# Patient Record
Sex: Male | Born: 1960 | Race: White | Hispanic: No | Marital: Married | Smoking: Never smoker
Health system: Southern US, Community
[De-identification: ages and names within clinical notes are randomized; demographics above are authoritative.]

## PROBLEM LIST (undated history)

## (undated) DIAGNOSIS — C801 Malignant (primary) neoplasm, unspecified: Secondary | ICD-10-CM

## (undated) DIAGNOSIS — M1990 Unspecified osteoarthritis, unspecified site: Secondary | ICD-10-CM

## (undated) DIAGNOSIS — Z9989 Dependence on other enabling machines and devices: Secondary | ICD-10-CM

## (undated) DIAGNOSIS — G4733 Obstructive sleep apnea (adult) (pediatric): Secondary | ICD-10-CM

## (undated) DIAGNOSIS — G2581 Restless legs syndrome: Secondary | ICD-10-CM

## (undated) HISTORY — DX: Restless legs syndrome: G25.81

## (undated) HISTORY — DX: Unspecified osteoarthritis, unspecified site: M19.90

## (undated) HISTORY — DX: Dependence on other enabling machines and devices: Z99.89

## (undated) HISTORY — DX: Obstructive sleep apnea (adult) (pediatric): G47.33

---

## 1984-05-21 HISTORY — PX: FACIAL FRACTURE SURGERY: SHX1570

## 1999-02-14 ENCOUNTER — Encounter: Admission: RE | Admit: 1999-02-14 | Discharge: 1999-03-22 | Payer: Self-pay | Admitting: Family Medicine

## 2010-07-03 ENCOUNTER — Other Ambulatory Visit: Payer: Self-pay | Admitting: Family Medicine

## 2010-07-03 DIAGNOSIS — R2230 Localized swelling, mass and lump, unspecified upper limb: Secondary | ICD-10-CM

## 2010-07-07 ENCOUNTER — Ambulatory Visit
Admission: RE | Admit: 2010-07-07 | Discharge: 2010-07-07 | Disposition: A | Discharge status: Home / Self Care | Payer: BC Managed Care – PPO | Source: Ambulatory Visit | Attending: Family Medicine | Admitting: Family Medicine

## 2010-07-07 DIAGNOSIS — R2230 Localized swelling, mass and lump, unspecified upper limb: Secondary | ICD-10-CM

## 2016-10-30 DIAGNOSIS — H5203 Hypermetropia, bilateral: Secondary | ICD-10-CM | POA: Diagnosis not present

## 2016-10-30 DIAGNOSIS — H524 Presbyopia: Secondary | ICD-10-CM | POA: Diagnosis not present

## 2017-01-28 ENCOUNTER — Ambulatory Visit
Admission: RE | Admit: 2017-01-28 | Discharge: 2017-01-28 | Disposition: A | Discharge status: Home / Self Care | Payer: Commercial Managed Care - PPO | Source: Ambulatory Visit | Attending: Family Medicine | Admitting: Family Medicine

## 2017-01-28 ENCOUNTER — Other Ambulatory Visit: Payer: Self-pay | Admitting: Family Medicine

## 2017-01-28 ENCOUNTER — Other Ambulatory Visit: Payer: Self-pay

## 2017-01-28 DIAGNOSIS — M7989 Other specified soft tissue disorders: Secondary | ICD-10-CM | POA: Diagnosis not present

## 2017-01-28 DIAGNOSIS — R52 Pain, unspecified: Secondary | ICD-10-CM

## 2017-01-28 DIAGNOSIS — M19042 Primary osteoarthritis, left hand: Secondary | ICD-10-CM | POA: Diagnosis not present

## 2017-01-28 DIAGNOSIS — M79645 Pain in left finger(s): Secondary | ICD-10-CM | POA: Diagnosis not present

## 2017-01-28 DIAGNOSIS — M25572 Pain in left ankle and joints of left foot: Secondary | ICD-10-CM | POA: Diagnosis not present

## 2017-05-07 DIAGNOSIS — E559 Vitamin D deficiency, unspecified: Secondary | ICD-10-CM | POA: Diagnosis not present

## 2017-05-07 DIAGNOSIS — Z23 Encounter for immunization: Secondary | ICD-10-CM | POA: Diagnosis not present

## 2017-05-07 DIAGNOSIS — Z0000 Encounter for general adult medical examination without abnormal findings: Secondary | ICD-10-CM | POA: Diagnosis not present

## 2017-05-07 DIAGNOSIS — Z125 Encounter for screening for malignant neoplasm of prostate: Secondary | ICD-10-CM | POA: Diagnosis not present

## 2017-05-07 DIAGNOSIS — G2581 Restless legs syndrome: Secondary | ICD-10-CM | POA: Diagnosis not present

## 2017-05-10 DIAGNOSIS — M79645 Pain in left finger(s): Secondary | ICD-10-CM | POA: Diagnosis not present

## 2017-05-10 DIAGNOSIS — M1812 Unilateral primary osteoarthritis of first carpometacarpal joint, left hand: Secondary | ICD-10-CM | POA: Diagnosis not present

## 2017-05-13 DIAGNOSIS — M79645 Pain in left finger(s): Secondary | ICD-10-CM | POA: Diagnosis not present

## 2017-05-13 DIAGNOSIS — M1812 Unilateral primary osteoarthritis of first carpometacarpal joint, left hand: Secondary | ICD-10-CM | POA: Diagnosis not present

## 2017-06-11 DIAGNOSIS — G4733 Obstructive sleep apnea (adult) (pediatric): Secondary | ICD-10-CM | POA: Diagnosis not present

## 2017-07-02 DIAGNOSIS — G4733 Obstructive sleep apnea (adult) (pediatric): Secondary | ICD-10-CM | POA: Diagnosis not present

## 2017-07-30 DIAGNOSIS — G4733 Obstructive sleep apnea (adult) (pediatric): Secondary | ICD-10-CM | POA: Diagnosis not present

## 2017-08-23 DIAGNOSIS — H9311 Tinnitus, right ear: Secondary | ICD-10-CM | POA: Diagnosis not present

## 2017-08-29 DIAGNOSIS — G4733 Obstructive sleep apnea (adult) (pediatric): Secondary | ICD-10-CM | POA: Diagnosis not present

## 2017-08-30 DIAGNOSIS — G4733 Obstructive sleep apnea (adult) (pediatric): Secondary | ICD-10-CM | POA: Diagnosis not present

## 2017-09-12 DIAGNOSIS — G4733 Obstructive sleep apnea (adult) (pediatric): Secondary | ICD-10-CM | POA: Diagnosis not present

## 2017-09-23 DIAGNOSIS — R030 Elevated blood-pressure reading, without diagnosis of hypertension: Secondary | ICD-10-CM | POA: Diagnosis not present

## 2017-09-29 DIAGNOSIS — G4733 Obstructive sleep apnea (adult) (pediatric): Secondary | ICD-10-CM | POA: Diagnosis not present

## 2017-10-01 DIAGNOSIS — H524 Presbyopia: Secondary | ICD-10-CM | POA: Diagnosis not present

## 2017-10-01 DIAGNOSIS — H5203 Hypermetropia, bilateral: Secondary | ICD-10-CM | POA: Diagnosis not present

## 2017-11-01 DIAGNOSIS — G4733 Obstructive sleep apnea (adult) (pediatric): Secondary | ICD-10-CM | POA: Diagnosis not present

## 2018-01-30 DIAGNOSIS — G4733 Obstructive sleep apnea (adult) (pediatric): Secondary | ICD-10-CM | POA: Diagnosis not present

## 2018-02-12 DIAGNOSIS — M542 Cervicalgia: Secondary | ICD-10-CM | POA: Diagnosis not present

## 2018-02-12 DIAGNOSIS — R030 Elevated blood-pressure reading, without diagnosis of hypertension: Secondary | ICD-10-CM | POA: Diagnosis not present

## 2018-02-27 ENCOUNTER — Other Ambulatory Visit: Payer: Self-pay | Admitting: Family Medicine

## 2018-02-27 DIAGNOSIS — R221 Localized swelling, mass and lump, neck: Secondary | ICD-10-CM

## 2018-03-01 DIAGNOSIS — G4733 Obstructive sleep apnea (adult) (pediatric): Secondary | ICD-10-CM | POA: Diagnosis not present

## 2018-03-05 ENCOUNTER — Ambulatory Visit
Admission: RE | Admit: 2018-03-05 | Discharge: 2018-03-05 | Disposition: A | Discharge status: Home / Self Care | Payer: Commercial Managed Care - PPO | Source: Ambulatory Visit | Attending: Family Medicine | Admitting: Family Medicine

## 2018-03-05 DIAGNOSIS — R221 Localized swelling, mass and lump, neck: Secondary | ICD-10-CM

## 2018-03-05 MED ORDER — IOPAMIDOL (ISOVUE-370) INJECTION 76%
75.0000 mL | Freq: Once | INTRAVENOUS | Status: AC | PRN
  Administered 2018-03-05: 75 mL via INTRAVENOUS

## 2018-03-07 ENCOUNTER — Other Ambulatory Visit: Payer: Commercial Managed Care - PPO

## 2018-03-13 DIAGNOSIS — R590 Localized enlarged lymph nodes: Secondary | ICD-10-CM | POA: Diagnosis not present

## 2018-03-13 DIAGNOSIS — R591 Generalized enlarged lymph nodes: Secondary | ICD-10-CM | POA: Insufficient documentation

## 2018-03-20 DIAGNOSIS — R591 Generalized enlarged lymph nodes: Secondary | ICD-10-CM | POA: Diagnosis not present

## 2018-03-20 DIAGNOSIS — R221 Localized swelling, mass and lump, neck: Secondary | ICD-10-CM | POA: Diagnosis not present

## 2018-04-01 ENCOUNTER — Other Ambulatory Visit (HOSPITAL_COMMUNITY): Payer: Self-pay | Admitting: Otolaryngology

## 2018-04-01 DIAGNOSIS — G4733 Obstructive sleep apnea (adult) (pediatric): Secondary | ICD-10-CM | POA: Diagnosis not present

## 2018-04-01 DIAGNOSIS — R591 Generalized enlarged lymph nodes: Secondary | ICD-10-CM

## 2018-04-04 ENCOUNTER — Other Ambulatory Visit: Payer: Self-pay | Admitting: Radiology

## 2018-04-07 ENCOUNTER — Encounter (HOSPITAL_COMMUNITY): Payer: Self-pay

## 2018-04-07 ENCOUNTER — Other Ambulatory Visit: Payer: Self-pay

## 2018-04-07 ENCOUNTER — Ambulatory Visit (HOSPITAL_COMMUNITY)
Admission: RE | Admit: 2018-04-07 | Discharge: 2018-04-07 | Disposition: A | Discharge status: Home / Self Care | Payer: Commercial Managed Care - PPO | Source: Ambulatory Visit | Attending: Otolaryngology | Admitting: Otolaryngology

## 2018-04-07 DIAGNOSIS — R590 Localized enlarged lymph nodes: Secondary | ICD-10-CM | POA: Insufficient documentation

## 2018-04-07 DIAGNOSIS — Z79899 Other long term (current) drug therapy: Secondary | ICD-10-CM | POA: Insufficient documentation

## 2018-04-07 DIAGNOSIS — R591 Generalized enlarged lymph nodes: Secondary | ICD-10-CM

## 2018-04-07 DIAGNOSIS — C770 Secondary and unspecified malignant neoplasm of lymph nodes of head, face and neck: Secondary | ICD-10-CM | POA: Diagnosis not present

## 2018-04-07 DIAGNOSIS — C801 Malignant (primary) neoplasm, unspecified: Secondary | ICD-10-CM | POA: Diagnosis not present

## 2018-04-07 LAB — PROTIME-INR
INR: 1
PROTHROMBIN TIME: 13.1 s (ref 11.4–15.2)

## 2018-04-07 LAB — CBC
HEMATOCRIT: 47 % (ref 39.0–52.0)
Hemoglobin: 15.4 g/dL (ref 13.0–17.0)
MCH: 30.7 pg (ref 26.0–34.0)
MCHC: 32.8 g/dL (ref 30.0–36.0)
MCV: 93.8 fL (ref 80.0–100.0)
NRBC: 0 % (ref 0.0–0.2)
Platelets: 306 10*3/uL (ref 150–400)
RBC: 5.01 MIL/uL (ref 4.22–5.81)
RDW: 12.1 % (ref 11.5–15.5)
WBC: 8.9 10*3/uL (ref 4.0–10.5)

## 2018-04-07 MED ORDER — MIDAZOLAM HCL 2 MG/2ML IJ SOLN
INTRAMUSCULAR | Status: AC | PRN
  Administered 2018-04-07: 1 mg via INTRAVENOUS

## 2018-04-07 MED ORDER — FENTANYL CITRATE (PF) 100 MCG/2ML IJ SOLN
INTRAMUSCULAR | Status: AC | PRN
  Administered 2018-04-07: 50 ug via INTRAVENOUS

## 2018-04-07 MED ORDER — LIDOCAINE HCL (PF) 1 % IJ SOLN
INTRAMUSCULAR | Status: AC
  Filled 2018-04-07: qty 30

## 2018-04-07 MED ORDER — FENTANYL CITRATE (PF) 100 MCG/2ML IJ SOLN
INTRAMUSCULAR | Status: AC
  Filled 2018-04-07: qty 2

## 2018-04-07 MED ORDER — SODIUM CHLORIDE 0.9 % IV SOLN: INTRAVENOUS | Status: DC

## 2018-04-07 MED ORDER — MIDAZOLAM HCL 2 MG/2ML IJ SOLN
INTRAMUSCULAR | Status: AC
  Filled 2018-04-07: qty 2

## 2018-04-07 NOTE — Procedures (Signed)
Left cervical adenopathy  S/p [place] CORE BX No comp Stable Full report in pacs Path pending

## 2018-04-07 NOTE — H&P (Addendum)
Chief Complaint: Patient was seen in consultation today for left cervical lymph node biopsy at the request of [place]  Referring Physician(s): Goodman,Glenn  Supervising Physician: Glenn Goodman  Patient Status: [facility] - Out-pt  History of Present Illness: Glenn Goodman is a 57 y.o. 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 A mucosal lesion is not definitely identified  Now scheduled for biopsy of left cervical LN Never smoker or smokeless tobacco  History reviewed. No pertinent past medical history.  History reviewed. No pertinent surgical history.  Allergies: Patient has no known allergies.  Medications: Prior to Admission medications   Medication Sig Start Date End Date Taking? Authorizing Provider  Cholecalciferol (VITAMIN D3) 50 MCG (2000 UT) TABS Take 2,000 Units by mouth daily.   Yes [provider]  famotidine (PEPCID) 20 MG tablet Take 20 mg by mouth daily as needed for heartburn or indigestion.   Yes [provider]  GLUCOSAMINE-CHONDROITIN PO Take 1 tablet by mouth daily.   Yes [provider]  Magnesium 500 MG TABS Take 500 mg by mouth daily.   Yes [provider]  pramipexole (MIRAPEX) 0.75 MG tablet Take 0.75 mg by mouth at bedtime.   Yes [provider]     History reviewed. No pertinent family history.  Social History   Socioeconomic History  . Marital status: Single    Spouse name: Not on file  . Number of children: Not on file  . Years of education: Not on file  . Highest education level: Not on file  Occupational History  . Not on file  Social Needs  . Financial  resource strain: Not on file  . Food insecurity:    Worry: Not on file    Inability: Not on file  . Transportation needs:    Medical: Not on file    Non-medical: Not on file  Tobacco Use  . Smoking status: Never Smoker  Substance and Sexual Activity  . Alcohol use: Not on file  . Drug use: Not on file  . Sexual activity: Not on file  Lifestyle  . Physical activity:    Days per week: Not on file    Minutes per session: Not on file  . Stress: Not on file  Relationships  . Social connections:    Talks on phone: Not on file    Gets together: Not on file    Attends religious service: Not on file    Active member of club or organization: Not on file    Attends meetings of clubs or organizations: Not on file    Relationship status: Not on file  Other Topics Concern  . Not on file  Social History Narrative  . Not on file     Review of Systems: A 12 point ROS discussed and pertinent positives are indicated in the HPI above.  All other systems are negative.  Review of Systems  Constitutional: Negative for activity change, appetite change, fatigue and fever.  HENT: Negative for sore throat and trouble swallowing.   Respiratory: Negative for cough.   Cardiovascular: Negative for chest pain.  Gastrointestinal: Negative for abdominal pain.  Neurological: Negative for weakness.  Psychiatric/Behavioral: Negative  for behavioral problems and confusion.    Vital Signs: BP 126/89   Pulse 82   Temp 98.6 F (37 C)   Resp 18   Ht 5\' 8"  (1.727 m)   Wt 180 lb (81.6 kg)   SpO2 96%   BMI 27.37 kg/m   Physical Exam  Constitutional: He is oriented to person, place, and time.  Cardiovascular: Normal rate and regular rhythm.  Pulmonary/Chest: Effort normal and breath sounds normal.  Abdominal: Soft. Bowel sounds are normal.  Musculoskeletal: Normal range of motion.  Neurological: He is alert and oriented to person, place, and time.  Skin: Skin is warm and dry.  Psychiatric: He has a  normal mood and affect. His behavior is normal. Judgment and thought content normal.  Vitals reviewed.   Imaging: No results found.  Labs:  CBC: Recent Labs    04/07/18 0653  WBC 8.9  HGB 15.4  HCT 47.0  PLT 306    COAGS: Recent Labs    04/07/18 0653  INR 1.00    BMP: No results for input(s): NA, K, CL, CO2, GLUCOSE, BUN, CALCIUM, CREATININE, GFRNONAA, GFRAA in the last 8760 hours.  Invalid input(s): CMP  LIVER FUNCTION TESTS: No results for input(s): BILITOT, AST, ALT, ALKPHOS, PROT, ALBUMIN in the last 8760 hours.  TUMOR MARKERS: No results for input(s): AFPTM, CEA, CA199, CHROMGRNA in the last 8760 hours.  Assessment and Plan:  Left cervical Lymphadenopathy Scheduled for biopsy of same Risks and benefits discussed with the patient including, but not limited to bleeding, infection, damage to adjacent structures or low yield requiring additional tests.  All of the patient's questions were answered, patient is agreeable to proceed. Consent signed and in chart.   Thank you for this interesting consult.  I greatly enjoyed meeting Glenn Goodman [facility] and look forward to participating in their care.  A copy of this report was sent to the requesting provider on this date.  Electronically Signed: Lavonia Drafts, PA-C 04/07/2018, 7:26 AM   I spent a total of  30 Minutes   in face to face in clinical consultation, greater than 50% of which was counseling/coordinating care for left cervical LN bx

## 2018-04-07 NOTE — Discharge Instructions (Signed)
Needle Biopsy, Care After Refer to this sheet in the next few weeks. These instructions provide you with information about caring for yourself after your procedure. Your health care provider may also give you more specific instructions. Your treatment has been planned according to current medical practices, but problems sometimes occur. Call your health care provider if you have any problems or questions after your procedure. What can I expect after the procedure? After your procedure, it is common to have soreness, bruising, or mild pain at the biopsy site. This should go away in a few days. Follow these instructions at home:  Rest as directed by your health care provider. 24-48 hours  Take medicines only as directed by your health care provider.  There are many different ways to close and cover the biopsy site, including stitches (sutures), skin glue, and adhesive strips. Follow your health care provider's instructions about: ? Biopsy site care. ? Bandage (dressing) changes and removal. Band-Aid may be removed in 24 hours. You may then shower.  Check your biopsy site every day for signs of infection. Watch for: ? Redness, swelling, or pain. ? Fluid, blood, or pus. Contact a health care provider if:  You have a fever.  You have redness, swelling, or pain at the biopsy site that lasts longer than a few days.  You have fluid, blood, or pus coming from the biopsy site.  You feel nauseous.  You vomit. Get help right away if:  You have shortness of breath.  You have trouble breathing.  You have chest pain.  You feel dizzy or you faint.  You have bleeding that does not stop with pressure or a bandage.  You cough up blood.  You have pain in your abdomen. This information is not intended to replace advice given to you by your health care provider. Make sure you discuss any questions you have with your health care provider. Document Released: 09/21/2014 Document Revised:  10/13/2015 Document Reviewed: 05/03/2014 Elsevier Interactive Patient Education  2018 [place]. Moderate Conscious Sedation, Adult, Care After These instructions provide you with information about caring for yourself after your procedure. Your health care provider may also give you more specific instructions. Your treatment has been planned according to current medical practices, but problems sometimes occur. Call your health care provider if you have any problems or questions after your procedure. What can I expect after the procedure? After your procedure, it is common:  To feel sleepy for several hours.  To feel clumsy and have poor balance for several hours.  To have poor judgment for several hours.  To vomit if you eat too soon.  Follow these instructions at home: For at least 24 hours after the procedure:   Do not: ? Participate in activities where you could fall or become injured. ? Drive. ? Use heavy machinery. ? Drink alcohol. ? Take sleeping pills or medicines that cause drowsiness. ? Make important decisions or sign legal documents. ? Take care of children on your own.  Rest. Eating and drinking  Follow the diet recommended by your health care provider.  If you vomit: ? Drink water, juice, or soup when you can drink without vomiting. ? Make sure you have little or no nausea before eating solid foods. General instructions  Have a responsible adult stay with you until you are awake and alert.  Take over-the-counter and prescription medicines only as told by your health care provider.  If you smoke, do not smoke without supervision.  Keep all  follow-up visits as told by your health care provider. This is important. Contact a health care provider if:  You keep feeling nauseous or you keep vomiting.  You feel light-headed.  You develop a rash.  You have a fever. Get help right away if:  You have trouble breathing. This information is not intended to  replace advice given to you by your health care provider. Make sure you discuss any questions you have with your health care provider. Document Released: 02/25/2013 Document Revised: 10/10/2015 Document Reviewed: 08/27/2015 Elsevier Interactive Patient Education  [employer].

## 2018-04-10 DIAGNOSIS — C4442 Squamous cell carcinoma of skin of scalp and neck: Secondary | ICD-10-CM | POA: Diagnosis not present

## 2018-04-14 DIAGNOSIS — C4442 Squamous cell carcinoma of skin of scalp and neck: Secondary | ICD-10-CM | POA: Insufficient documentation

## 2018-04-21 ENCOUNTER — Telehealth: Payer: Self-pay | Admitting: *Deleted

## 2018-04-21 NOTE — Telephone Encounter (Signed)
Let rick know about this pt he will  Call thie pt and set something up.

## 2018-04-22 ENCOUNTER — Telehealth: Payer: Self-pay | Admitting: *Deleted

## 2018-04-22 ENCOUNTER — Other Ambulatory Visit: Payer: Self-pay | Admitting: Otolaryngology

## 2018-04-22 DIAGNOSIS — C4442 Squamous cell carcinoma of skin of scalp and neck: Secondary | ICD-10-CM | POA: Diagnosis not present

## 2018-04-22 DIAGNOSIS — C024 Malignant neoplasm of lingual tonsil: Secondary | ICD-10-CM | POA: Diagnosis not present

## 2018-04-22 DIAGNOSIS — C01 Malignant neoplasm of base of tongue: Secondary | ICD-10-CM | POA: Diagnosis not present

## 2018-04-22 HISTORY — PX: DIRECT LARYNGOSCOPY: SHX5326

## 2018-04-23 ENCOUNTER — Other Ambulatory Visit: Payer: Self-pay | Admitting: *Deleted

## 2018-04-23 DIAGNOSIS — C4442 Squamous cell carcinoma of skin of scalp and neck: Secondary | ICD-10-CM

## 2018-04-23 NOTE — Telephone Encounter (Signed)
Oncology Nurse Navigator Documentation  Rec'd call from Mr. Popp re his referral to [facility] by ENT Dr. Janace Hoard.  Introduced myself, explained my role as his navigator, indicated I would be coordinating the scheduling of PET scan followed by appts with Rad/MedOnc.    He indicated willingness to have PET at [facility].  He further noted he is having pandenoscopy with biopsy later today.  I explained the purpose of a dental evaluation prior to starting RT, indicated he wd be contacted by [place] to arrange an appt within a day or so of his appt with Dr. Isidore Moos.  I encouraged him to call me with questions/concerns as he continues with appts.  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  Work hours are flexible though he prefers appts after 1:00 when possible.  Has Short Term Disability. . Support system:  Wife, "empty nesters". . Transportation:  Drives self. Marland Kitchen PCP:  Yes, Elsworth Soho.  Sees him minimally annually.  Last saw in September, he ordered CT Neck.  Unsure if PCP aware of dx.  PCD;  Dr. Clide Cliff.  Sees him q 6 months.  Reports good dental health.  [name], RN, BSN Head & Neck Oncology Nurse [place] at [place] [phone]

## 2018-04-24 ENCOUNTER — Telehealth: Payer: Self-pay | Admitting: *Deleted

## 2018-04-24 ENCOUNTER — Other Ambulatory Visit: Payer: Self-pay | Admitting: *Deleted

## 2018-04-24 DIAGNOSIS — C01 Malignant neoplasm of base of tongue: Secondary | ICD-10-CM

## 2018-04-24 NOTE — Telephone Encounter (Signed)
CALLED PATIENT TO INFORM OF PET SCAN FOR 04/29/18 - ARRIVAL TIME- 9:30 AM @ [facility] (RADIOLOGY), PT. TO BE NPO- 6 HRS. PRIOR TO TEST, LVM FOR A RETURN CALL

## 2018-04-25 ENCOUNTER — Telehealth: Payer: Self-pay | Admitting: *Deleted

## 2018-04-25 NOTE — Telephone Encounter (Signed)
Oncology Nurse Navigator Documentation  Called Glenn Goodman, provided 1/16 appts, 7:30 NE / 8:00 consult with Dr. Isidore Moos. Explained [name] registration/arrival procedures. He noted per Dr. Janace Hoard referral, he has 12/18 appt with Dr. Nicolette Bang [facility] to discuss TORS tmt option. I encouraged him to call me with questions/concerns.  [name], RN, BSN Head & Neck Oncology Nurse [place] at [place] [phone]

## 2018-04-28 ENCOUNTER — Telehealth: Payer: Self-pay | Admitting: Hematology

## 2018-04-28 NOTE — Telephone Encounter (Signed)
Pt returned my call and confirmed appt w/Dr. Maylon Peppers on 12/18 at 1pm.

## 2018-04-28 NOTE — Telephone Encounter (Signed)
Cld and left the pt appt date and time to see Dr Maylon Peppers on 12/18 at 1pm.

## 2018-04-29 ENCOUNTER — Ambulatory Visit
Admission: RE | Admit: 2018-04-29 | Discharge: 2018-04-29 | Disposition: A | Discharge status: Home / Self Care | Payer: Commercial Managed Care - PPO | Source: Ambulatory Visit | Attending: Radiation Oncology | Admitting: Radiation Oncology

## 2018-04-29 DIAGNOSIS — R590 Localized enlarged lymph nodes: Secondary | ICD-10-CM | POA: Insufficient documentation

## 2018-04-29 DIAGNOSIS — C099 Malignant neoplasm of tonsil, unspecified: Secondary | ICD-10-CM | POA: Insufficient documentation

## 2018-04-29 DIAGNOSIS — C4442 Squamous cell carcinoma of skin of scalp and neck: Secondary | ICD-10-CM

## 2018-04-29 LAB — GLUCOSE, CAPILLARY: Glucose-Capillary: 123 mg/dL — ABNORMAL HIGH (ref 70–99)

## 2018-04-29 MED ORDER — FLUDEOXYGLUCOSE F - 18 (FDG) INJECTION
9.5000 | Freq: Once | INTRAVENOUS | Status: AC | PRN
  Administered 2018-04-29: 10.14 via INTRAVENOUS

## 2018-04-29 NOTE — Progress Notes (Signed)
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 p16 immunohistochemistry is POSITIVE  04/22/18 Diagnosis Tonsil, biopsy, left, base of tongue - INVASIVE KERATINIZING SQUAMOUS CELL CARCINOMA.  Patient presented with symptoms of: He noticed in September that the left side of his neck was larger. He then presented to his PCP who ordered at CT neck.   Biopsies of left cervical lymph node revealed: metastatic squamous cell carcinoma involving a lymph node.  Biopsies of left base of tongue revealed invasive keratinizing squamous cell carcinoma.   Nutrition Status Yes No Comments  Weight changes? []  [x]    Swallowing concerns? [x]  []  He reports soreness after his biopsy.   PEG? []  [x]     Referrals Yes No Comments  Social Work? []  [x]    Dentistry? [x]  []  Dr. Enrique Sack 04/30/18  Swallowing therapy? []  [x]    Nutrition? []  [x]    Med/Onc? []  []  Dr. Maylon Peppers 05/07/18   Safety Issues Yes No Comments  Prior radiation? []  [x]    Pacemaker/ICD? []  [x]    Possible current pregnancy? []  [x]    Is the patient on methotrexate? []  [x]     Tobacco/Marijuana/Snuff/ETOH use: He has never smoked.   Past/Anticipated interventions by otolaryngology, if any:  03/13/18 Initial consult Dr. Janace Hoard  04/10/18 Dr. Janace Hoard Impression & Plans:  Glenn Goodman is a 57 y.o. male with no evidence of primary so we discussed the need to search for that. We discussed a direct laryngoscopy with guided biopsies and examination. He was informed the risk and benefits of the procedure and options were discussed all questions were answered and consent was obtained. We will also get him set up with radiation oncology to get that started. They will decide about a PET scan. We discussed various issues regarding the cancer and treatment for roughly 25  minutes.  ---- There is an appointment to see Dr. Nicolette Bang scheduled for 05/07/18  Past/Anticipated interventions by medical oncology, if any:  05/07/18 Dr. Maylon Peppers   Current Complaints / other details:    04/29/18 PET scan  BP 139/83 (BP Location: Right Arm, Patient Position: Sitting)   Pulse 75   Temp 97.7 F (36.5 C) (Oral)   Resp 16   Ht 5\' 8"  (1.727 m)   Wt 187 lb 6 oz (85 kg)   SpO2 97%   BMI 28.49 kg/m    Wt Readings from Last 3 Encounters:  05/05/18 187 lb 6 oz (85 kg)  04/07/18 180 lb (81.6 kg)

## 2018-04-29 NOTE — Progress Notes (Signed)
[place] CONSULT NOTE  Patient Care Team: [name], MD as PCP - General (Family Medicine) Melissa Montane, MD as Consulting Physician (Otolaryngology) Eppie Gibson, MD as Attending Physician (Radiation Oncology) Tish Men, MD as Consulting Physician (Hematology) Leota Sauers, RN as Oncology Nurse Navigator  HEME/ONC OVERVIEW: 1. 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, p16+ -02/2018: CT neck showed enlarged left Level II and II LN with central necrosis (largest 2.4cm); effacement of the left IJ vein noted  -03/2018: US-guided bx of left cervical LN; path showed SCCa, p16+ -04/2018: PET showed FDG-avid left palatine tonsil lesion measuring ~1.3cm with associated FDG-avid left Level IIa LN's; no evidence of metastatic disease   ASSESSMENT & PLAN:   Stage I (cT1N1M0) left tonsil SCCa, p16+ -I reviewed the patient's external records, including ENT clinic notes and path reports -I also independently reviewed the radiologic images of CT and PET, and agree with findings as documented -In summary, patient presented to ENT for a left neck mass in end of 02/2018, and underwent biopsy of the left cervical LN, which show SCCa, p16+; in addition, panendoscopy with left tonsillar biopsy showed squamous cell carcinoma, indicating that the tonsil is most likely the primary site of disease; PET showed FDG-avid left tonsil and left cervical LN's without evidence of metastatic disease -Patient met with Dr. Nicolette Bang at [facility] this morning, who discussed with the patient the benefits and risks of TORS -I also discussed with the patient at length the rationale for various treatment approaches, including upfront surgery followed by adjuvant therapy as indicated (RT or chemoradiation), and definitive chemoradiation -Based on current guidelines, surgery followed by adjuvant RT appears to be comparable to definitive chemoradiation; however, there are not yet definitive predictors based on  imaging studies prior to surgery to determine those patients who may require adjuvant chemotherapy in addition to radiation -The patient's case was discussed at the tumor board; per radiology, the border of one of the cervical lymph nodes appearred to be somewhat indistinct, raising suspicion for possible ECE -I discussed with the patient that the best case scenario, he would likely require adjuvant RT in the absence of positive margin or ECE; however, if he has any high risk adverse features, then he will require adjuvant chemoradiation -I also counseled the patient regarding some of the risks, benefits, side-effects of cisplatin. Some of the short term side-effects included, though not limited to, including weight loss, life threatening infections, risk of allergic reactions, need for transfusions of blood products, nausea, vomiting, change in bowel habits, loss of hair, admission to hospital for various reasons, and risks of death. Long term side-effects are also discussed including risks of infertility, permanent damage to nerve function, hearing loss, chronic fatigue, kidney damage with possibility needing hemodialysis, and rare secondary malignancy including bone marrow disorders. -The patient expressed understanding, and would like to discuss this further with his wife before making a decision; however, he felt that he was leaning toward upfront surgery first -Pending the patient decision, the H&N navigator will help arrange for follow-up with ENT or with medical oncology/radiation oncology  No orders of the defined types were placed in this encounter.  A total of more than 45 minutes were spent face-to-face with the patient during this encounter and over half of that time was spent on counseling and coordination of care as outlined above.    All questions were answered. The patient knows to call the clinic with any problems, questions or concerns.  Tish Men, MD 05/07/2018  1:56 PM   CHIEF  COMPLAINTS/PURPOSE OF CONSULTATION:  "I am here for cancer in the neck"  HISTORY OF PRESENTING ILLNESS:  Glenn Goodman 57 y.o. 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  Patient presented to Dr. Janace Hoard of ENT in late 02/2018 for neck mass.  Patient reports that he first felt the left neck mass after a massage in 01/2018.  The lymph node initially resolved with supportive therapy, including heat packs and NSAIDs, but he continued to have pain in the left side of the neck, prompting his PCP to obtain CT neck.  CT neck showed enlarged left Level II and III lymph nodes, concerning for malignancy.  He was initially underwent FNA of the left cervical lymph node in office in and of 02/2018, which was nondiagnostic.  He then underwent ultrasound-guided lymph node biopsy on 04/07/2018, which showed squamous of carcinoma,  p16+.  He was then taken to the OR for panendoscopy with a biopsy, and biopsy of the left tonsillar lesion showed squamous cell carcinoma.  Patient reports that since he initially noticed the left cervical lymph nodes, the lymph nodes have been overall stable in size.  He denies any fever, chill, night sweats, chest pain, dyspnea, abdominal pain, nausea, vomiting, diarrhea, or abnormal bleeding or bruising.  He works full-time as an Actuary.  I have reviewed his chart and materials related to his cancer extensively and collaborated history with the patient. Summary of oncologic history is as follows:   Squamous cell carcinoma of head and neck ([place])   03/20/2018 Procedure    FNA of the left cervical LN in office    03/20/2018 Pathology Results    Non-diagnostic    04/07/2018 Procedure    US-guided left cervical LN biopsy/FNA     04/07/2018 Pathology Results    (Accession: [id number]) Lymph node, needle/core biopsy, left cervical - METASTATIC SQUAMOUS CELL CARCINOMA INVOLVING A LYMPH NODE (1/1) - SEE  COMMENT  p16 IHC positive. EBV ISH negative.     04/22/2018 Pathology Results    (Accession: [id number]) Tonsil, biopsy, left, base of tongue - INVASIVE KERATINIZING SQUAMOUS CELL CARCINOMA.    04/29/2018 Imaging    PET:  IMPRESSION: 1. Left palatine tonsil/adjacent pharyngeal mucosal space lesion is asymmetric, measures about 1.3 cm in long axis, and has a maximum SUV of 8.2, compatible with malignancy. There is associated hypermetabolic left level IIa adenopathy as shown on prior exams. 2. Symmetric accentuated activity in the lymphoid tissue at the tongue base, maximum SUV 5.3, quite likely physiologic given the symmetry.  3. No findings of metastatic disease Pred to the chest, abdomen/pelvis, or skeleton.     MEDICAL HISTORY:  Past Medical History:  Diagnosis Date  . Arthritis   . OSA on CPAP   . Restless leg syndrome     SURGICAL HISTORY: Past Surgical History:  Procedure Laterality Date  . DIRECT LARYNGOSCOPY Left 04/22/2018   Dr. Janace Hoard  . FACIAL FRACTURE SURGERY Left 1986    SOCIAL HISTORY: Social History   Socioeconomic History  . Marital status: Married    Spouse name: Not on file  . Number of children: 2  . Years of education: Not on file  . Highest education level: Not on file  Occupational History  . Not on file  Social Needs  . Financial resource strain: Not on file  . Food insecurity:    Worry: Not on file    Inability:  Not on file  . Transportation needs:    Medical: No    Non-medical: No  Tobacco Use  . Smoking status: Never Smoker  . Smokeless tobacco: Never Used  Substance and Sexual Activity  . Alcohol use: Not Currently  . Drug use: Never  . Sexual activity: Not on file  Lifestyle  . Physical activity:    Days per week: Not on file    Minutes per session: Not on file  . Stress: Not on file  Relationships  . Social connections:    Talks on phone: Not on file    Gets together: Not on file    Attends religious service: Not on file     Active member of club or organization: Not on file    Attends meetings of clubs or organizations: Not on file    Relationship status: Not on file  . Intimate partner violence:    Fear of current or ex partner: Not on file    Emotionally abused: Not on file    Physically abused: Not on file    Forced sexual activity: Not on file  Other Topics Concern  . Not on file  Social History Narrative  . Not on file    FAMILY HISTORY: Family History  Problem Relation Age of Onset  . Hypertension Mother   . Prostate cancer Father     ALLERGIES:  has No Known Allergies.  MEDICATIONS:  Current Outpatient Medications  Medication Sig Dispense Refill  . famotidine (PEPCID) 20 MG tablet Take 20 mg by mouth daily as needed for heartburn or indigestion.    . pramipexole (MIRAPEX) 0.75 MG tablet Take 0.75 mg by mouth at bedtime.    . Cholecalciferol (VITAMIN D3) 50 MCG (2000 UT) TABS Take 2,000 Units by mouth daily.    Marland Kitchen GLUCOSAMINE-CHONDROITIN PO Take 1 tablet by mouth daily.    . Magnesium 500 MG TABS Take 500 mg by mouth daily.    . sodium fluoride (PREVIDENT 5000 PLUS) 1.1 % CREA dental cream Apply to tooth brush. Brush teeth for 2 minutes. Spit out excess. DO NOT rinse afterwards. Repeat nightly. (Patient not taking: Reported on 05/05/2018) 1 Tube prn   No current facility-administered medications for this visit.     REVIEW OF SYSTEMS:   Constitutional: ( - ) fevers, ( - )  chills , ( - ) night sweats Eyes: ( - ) blurriness of vision, ( - ) double vision, ( - ) watery eyes Ears, nose, mouth, throat, and face: ( - ) mucositis, ( - ) sore throat Respiratory: ( - ) cough, ( - ) dyspnea, ( - ) wheezes Cardiovascular: ( - ) palpitation, ( - ) chest discomfort, ( - ) lower extremity swelling Gastrointestinal:  ( - ) nausea, ( - ) heartburn, ( - ) change in bowel habits Skin: ( - ) abnormal skin rashes Lymphatics: ( - ) new lymphadenopathy, ( - ) easy bruising Neurological: ( - ) numbness, (  - ) tingling, ( - ) new weaknesses Behavioral/Psych: ( - ) mood change, ( - ) new changes  All other systems were reviewed with the patient and are negative.  PHYSICAL EXAMINATION: ECOG PERFORMANCE STATUS: 0 - Asymptomatic  Vitals:   05/07/18 1305  BP: 120/77  Pulse: 71  Resp: 14  Temp: 98.1 F (36.7 C)  SpO2: 98%   Filed Weights   05/07/18 1305  Weight: 191 lb 1.6 oz (86.7 kg)    GENERAL: alert, no distress and comfortable  SKIN: skin color, texture, turgor are normal, no rashes or significant lesions EYES: conjunctiva are pink and non-injected, sclera clear OROPHARYNX: no exudate, no erythema; lips, buccal mucosa, and tongue normal  NECK: supple, non-tender LYMPH:  Firm, slightly fixed left cervical Level II lymphadenopathy measure ~3cm, no other palpable lymphadenopathy in the neck or axilla  LUNGS: clear to auscultation and percussion with normal breathing effort HEART: regular rate & rhythm, no murmurs, no lower extremity edema ABDOMEN: soft, non-tender, non-distended, normal bowel sounds Musculoskeletal: no cyanosis of digits and no clubbing  PSYCH: alert & oriented x 3, fluent speech NEURO: no focal motor/sensory deficits  LABORATORY DATA:  I have reviewed the data as listed Lab Results  Component Value Date   WBC 8.9 04/07/2018   HGB 15.4 04/07/2018   HCT 47.0 04/07/2018   MCV 93.8 04/07/2018   PLT 306 04/07/2018   No results found for: NA, K, CL, CO2  RADIOGRAPHIC STUDIES: I have personally reviewed the radiological images as listed and agreed with the findings in the report. Nm Pet Image Initial (pi) Skull Base To Thigh  Result Date: 04/29/2018 CLINICAL DATA:  Initial treatment strategy for squamous cell carcinoma of the neck on ultrasound guided core biopsy of left cervical adenopathy 04/07/2018. EXAM: NUCLEAR MEDICINE PET SKULL BASE TO THIGH TECHNIQUE: 10.1 mCi F-18 FDG was injected intravenously. Full-ring PET imaging was performed from the skull base to  thigh after the radiotracer. CT data was obtained and used for attenuation correction and anatomic localization. Fasting blood glucose: 23 mg/dl COMPARISON:  CT neck 04/05/2018 FINDINGS: Mediastinal blood pool activity: SUV max 2.2 NECK: Along the left palatine tonsil and adjacent pharyngeal mucosal space, there is accentuated metabolic activity suspicious for malignancy with maximum SUV 8.2. The abnormal activity measures approximately 1.3 by 1.1 cm. Symmetric accentuated activity along the lymphoid tissue at the tongue base, maximum SUV 5.3. Quite likely physiologic. Adjacent left level IIa adenopathy is present with an upper lymph node measuring 2.7 cm in short axis on image 42/3 with maximum SUV 10.8, and a more centrally necrotic lower left-sided node extending down to the level of the hyoid measuring up to 2.2 cm in short axis with maximum SUV 8.0. No right-sided hypermetabolic activity or significant abnormal nodal activity below the level of the hyoid. Incidental CT findings: none CHEST: No significant abnormal hypermetabolic activity in this region. Incidental CT findings: none ABDOMEN/PELVIS: No significant abnormal hypermetabolic activity in this region. Incidental CT findings: Left kidney upper pole parapelvic cyst. Aortoiliac atherosclerotic vascular disease. Mild prostatomegaly. SKELETON: No significant abnormal hypermetabolic activity in this region. Incidental CT findings: none IMPRESSION: 1. Left palatine tonsil/adjacent pharyngeal mucosal space lesion is asymmetric, measures about 1.3 cm in long axis, and has a maximum SUV of 8.2, compatible with malignancy. There is associated hypermetabolic left level IIa adenopathy as shown on prior exams. 2. Symmetric accentuated activity in the lymphoid tissue at the tongue base, maximum SUV 5.3, quite likely physiologic given the symmetry. 3. No findings of metastatic disease Pred to the chest, abdomen/pelvis, or skeleton. Electronically Signed   By: Van Clines M.D.   On: 04/29/2018 14:13    PATHOLOGY: I have reviewed the pathology reports as documented in the oncologist history.

## 2018-04-30 ENCOUNTER — Ambulatory Visit (HOSPITAL_COMMUNITY): Payer: Self-pay | Admitting: Dentistry

## 2018-04-30 ENCOUNTER — Encounter (HOSPITAL_COMMUNITY): Payer: Self-pay | Admitting: Dentistry

## 2018-04-30 VITALS — BP 134/76 | HR 86 | Temp 98.6°F

## 2018-04-30 DIAGNOSIS — C760 Malignant neoplasm of head, face and neck: Secondary | ICD-10-CM

## 2018-04-30 DIAGNOSIS — Z01818 Encounter for other preprocedural examination: Secondary | ICD-10-CM | POA: Diagnosis not present

## 2018-04-30 DIAGNOSIS — M270 Developmental disorders of jaws: Secondary | ICD-10-CM

## 2018-04-30 DIAGNOSIS — M2632 Excessive spacing of fully erupted teeth: Secondary | ICD-10-CM

## 2018-04-30 DIAGNOSIS — M278 Other specified diseases of jaws: Secondary | ICD-10-CM

## 2018-04-30 DIAGNOSIS — K08409 Partial loss of teeth, unspecified cause, unspecified class: Secondary | ICD-10-CM

## 2018-04-30 DIAGNOSIS — K0530 Chronic periodontitis, unspecified: Secondary | ICD-10-CM

## 2018-04-30 DIAGNOSIS — K036 Deposits [accretions] on teeth: Secondary | ICD-10-CM

## 2018-04-30 MED ORDER — SODIUM FLUORIDE 1.1 % DT CREA: TOPICAL_CREAM | DENTAL | 99 refills | Status: DC

## 2018-04-30 NOTE — Patient Instructions (Signed)

## 2018-04-30 NOTE — Progress Notes (Signed)
DENTAL CONSULTATION  Date of Consultation:  04/30/2018 Patient Name:   Glenn Goodman [facility] Date of Birth:   Aug 01, 1960 Medical Record Number: [id number]  VITALS: BP 134/76 (BP Location: Right Arm)   Pulse 86   Temp 98.6 F (37 C)   CHIEF COMPLAINT: Patient referred by Dr. Isidore Moos for a dental consultation.  HPI: Glenn Goodman is a 57 year old male squamous cell carcinoma left neck with probable tonsil or base of tongue primary. Patient with possible TORS procedure followed by radiation therapy and possibly chemotherapy. Patient is now seen as part of a pre-chemotherapy ration therapy dental protocol examination.  The patient currently denies acute toothaches, swellings, or abscesses. Patient was seen for an exam and cleaning by his primary dentist and July 2019. Patient is usually seen on every 6 month basis. Patient indicates that he sees Dr. Clide Cliff as his primary dentist since his previous Dentist retired.  Patient has a history of orthodontic therapy from ages 81 through 86. Patient denies having partial dentures.Patient denies having dental phobia.  PROBLEM LIST: Patient Active Problem List   Diagnosis Date Noted  . Squamous cell carcinoma of head and neck ([place]) 04/29/2018    PMH: Past Medical History:  Diagnosis Date  . Arthritis   . OSA on CPAP   . Restless leg syndrome     PSH: Past Surgical History:  Procedure Laterality Date  . DIRECT LARYNGOSCOPY Left 04/22/2018   Dr. Janace Hoard    ALLERGIES: No Known Allergies  MEDICATIONS: Current Outpatient Medications  Medication Sig Dispense Refill  . famotidine (PEPCID) 20 MG tablet Take 20 mg by mouth daily as needed for heartburn or indigestion.    . pramipexole (MIRAPEX) 0.75 MG tablet Take 0.75 mg by mouth at bedtime.    . Cholecalciferol (VITAMIN D3) 50 MCG (2000 UT) TABS Take 2,000 Units by mouth daily.    Marland Kitchen GLUCOSAMINE-CHONDROITIN PO Take 1 tablet by mouth daily.    . Magnesium 500 MG TABS Take 500 mg by mouth  daily.     No current facility-administered medications for this visit.     LABS: Lab Results  Component Value Date   WBC 8.9 04/07/2018   HGB 15.4 04/07/2018   HCT 47.0 04/07/2018   MCV 93.8 04/07/2018   PLT 306 04/07/2018   No results found for: NA, K, CL, CO2, GLUCOSE, BUN, CREATININE, CALCIUM, GFRNONAA, GFRAA Lab Results  Component Value Date   INR 1.00 04/07/2018   No results found for: PTT  SOCIAL HISTORY: Social History   Socioeconomic History  . Marital status: Married    Spouse name: Not on file  . Number of children: 2  . Years of education: Not on file  . Highest education level: Not on file  Occupational History  . Not on file  Social Needs  . Financial resource strain: Not on file  . Food insecurity:    Worry: Not on file    Inability: Not on file  . Transportation needs:    Medical: Not on file    Non-medical: Not on file  Tobacco Use  . Smoking status: Never Smoker  . Smokeless tobacco: Never Used  Substance and Sexual Activity  . Alcohol use: Not Currently  . Drug use: Never  . Sexual activity: Not on file  Lifestyle  . Physical activity:    Days per week: Not on file    Minutes per session: Not on file  . Stress: Not on file  Relationships  . Social connections:  Talks on phone: Not on file    Gets together: Not on file    Attends religious service: Not on file    Active member of club or organization: Not on file    Attends meetings of clubs or organizations: Not on file    Relationship status: Not on file  . Intimate partner violence:    Fear of current or ex partner: Not on file    Emotionally abused: Not on file    Physically abused: Not on file    Forced sexual activity: Not on file  Other Topics Concern  . Not on file  Social History Narrative  . Not on file    FAMILY HISTORY: Family History  Problem Relation Age of Onset  . Hypertension Mother   . Prostate cancer Father     REVIEW OF SYSTEMS: Reviewed with the  patient as per History of present illness. Psych: Patient denies having dental phobia.  DENTAL HISTORY: CHIEF COMPLAINT: Patient referred by Dr. Isidore Moos for a dental consultation.  HPI: Glenn Goodman is a 56 year old male with squamous cell carcinoma of the left neck with probable tonsil or base of tongue primary. Patient with possible TORS procedure followed by radiation therapy and possibly chemotherapy. Patient is now seen as part of a pre-chemoradiation therapy dental protocol examination.  The patient currently denies acute toothaches, swellings, or abscesses. Patient was seen for an exam and cleaning by his primary dentist and July 2019. Patient is usually seen on every 6 month basis. Patient indicates that he sees Dr. Clide Cliff as his primary dentist since his previous Dentist retired 18 months ago.  Patient has a history of orthodontic therapy from ages 65 through 75. Patient denies having partial dentures.Patient denies having dental phobia.  DENTAL EXAMINATION: GENERAL:  The patient is a well-nourished, well-developed male in no acute distress. HEAD AND NECK:  There is left neck lymphadenopathy. The patient denies acute TMJ symptoms but has had a history of previous TMJ discomfort.  Patient has a click upon maximum opening on the right side. There is no history of occlusal splint therapy by report. INTRAORAL EXAM:  The patient has normal saliva. The patient has bilateral mandibular lingual tori. The patient has bilateral maxillary and mandibular lateral exostoses as per dental charting form. DENTITION: The patient is missing tooth numbers 1,4,13,16, 17, 21, 28, and 32.  The spaces are closed secondary to previous orthodontic therapy.  There is a diastema between tooth numbers 8 and 9. PERIODONTAL:  Patient has chronic periodontitis with plaque accumulations and pocketing as per periodontal charting form. No significant tooth mobility is noted. There is incipient bone loss  noted. DENTAL CARIES/SUBOPTIMAL RESTORATIONS: no obvious dental caries are noted. ENDODONTIC:  The patient denies acute pulpitis symptoms. I do not see any evidence of pedicle pathology or radiolucency. Patient has had previous root canal therapy associated with tooth #14 with no persistent periapical pathology or symptoms. CROWN AND BRIDGE:  The patient has crowns on tooth numbers 3, 14, and 19. PROSTHODONTIC: There are no partial dentures. OCCLUSION: The patient has a stable occlusion and is status post orthodontic therapy from ages of 35 through 12.  RADIOGRAPHIC INTERPRETATION: An orthopantogram was taken and supplemented with a full series of dental radiographs. There are multiple missing teeth. Patient had previous premolars removed for orthodontic therapy and the spaces have been closed. There is incipient bone loss noted. Multiple crown restorations are noted. There is a previous root canal therapy associated with tooth #14 with  no obvious persistent periapical radiolucency.  ASSESSMENTS: 1. Squamous cell carcinoma of the left neck with probable left tonsil or left base of tongue primary  2. Pre-chemoradiation therapy dental protocol 3. Chronic periodontitis with bone loss 4. Accretions 5. Bilateral mandibular lingual tori 6. Bilateral maxillary and mandibular lateral exostoses 7. Multiple missing teeth with most spaces closed after orthodontic therapy 8. Right temporal mandibular joint click on maximum opening with no acute symptoms 9. Stable occlusion  PLAN/RECOMMENDATIONS: 1. I discussed the risks, benefits, and complications of various treatment options with the patient in relationship to his medical and dental conditions, anticipated TORS procedure, and possible radiation therapy and chemotherapy. We discussed the risks of chemoradiation therapy to include xerostomia, radiation caries, trismus, mucositis, taste changes, gum and jawbone changes, and risk for infection and  osteoradionecrosis. We discussed various treatment options to include no treatment, selective extraction of teeth in the primary field of radiation therapy, alveoloplasty, pre-prosthetic surgery as indicated, periodontal therapy, dental restorations, root canal therapy, crown and bridge therapy, implant therapy, and replacement of missing teeth as indicated. We also discussed fabrication of fluoride trays and scatter protection devices.  The patient is currently going to proceed with consultation with the radiation oncologist, medical oncologist, and Dr. Nicolette Bang at [facility] concerning surgery. We will then discuss need for anticipated radiation therapy with or without chemotherapy and further discuss consideration for extraction of teeth in primary field of radiation therapy as indicated. The patient did agree to proceed with impressions today for the future fabrication of fluoride trays and scatter protection devices.  A prescription for PreviDent 5000 fluoride therapy was sent to his pharmacy with refills for one year. Patient is use at bedtime as instructed.   2. Discussion of findings with medical team and coordination of future medical and dental care as needed.  I spent in excess of  120 minutes during the conduct of this consultation and >50% of this time involved direct face-to-face encounter for counseling and/or coordination of the patient's care.    Lenn Cal, DDS

## 2018-05-01 DIAGNOSIS — G4733 Obstructive sleep apnea (adult) (pediatric): Secondary | ICD-10-CM | POA: Diagnosis not present

## 2018-05-05 ENCOUNTER — Encounter: Payer: Self-pay | Admitting: Radiation Oncology

## 2018-05-05 ENCOUNTER — Other Ambulatory Visit: Payer: Self-pay

## 2018-05-05 ENCOUNTER — Ambulatory Visit
Admission: RE | Admit: 2018-05-05 | Discharge: 2018-05-05 | Disposition: A | Discharge status: Home / Self Care | Payer: Commercial Managed Care - PPO | Source: Ambulatory Visit | Attending: Radiation Oncology | Admitting: Radiation Oncology

## 2018-05-05 ENCOUNTER — Encounter: Payer: Self-pay | Admitting: *Deleted

## 2018-05-05 DIAGNOSIS — C760 Malignant neoplasm of head, face and neck: Secondary | ICD-10-CM | POA: Diagnosis present

## 2018-05-05 DIAGNOSIS — Z85818 Personal history of malignant neoplasm of other sites of lip, oral cavity, and pharynx: Secondary | ICD-10-CM | POA: Diagnosis not present

## 2018-05-05 DIAGNOSIS — C01 Malignant neoplasm of base of tongue: Secondary | ICD-10-CM | POA: Diagnosis not present

## 2018-05-05 DIAGNOSIS — C770 Secondary and unspecified malignant neoplasm of lymph nodes of head, face and neck: Secondary | ICD-10-CM | POA: Diagnosis not present

## 2018-05-05 NOTE — Progress Notes (Signed)
Met with Mr. Brod during initial consult with Dr. Isidore Moos.  He was accompanied by his wife.   . Further introduced myself as his Navigator, explained my role as a member of the Care Team.   . Provided New Patient Information packet, discussed contents: o Contact information for physician(s), myself, other members of the Care Team. o Advance Directive information ([place] blue pamphlet with LCSW contact info).  He indicated he as LW and HCPOA, will bring copies. o Fall Prevention Patient Safety Plan o Appointment Guideline o [place] [facility] map with highlight of [place] o SLP information sheet o Symptom Management Clinic information . Provided introductory explanation of radiation treatment including SIM planning and purpose of Aquaplast head and shoulder mask, showed them example.   . Provided and discussed education handouts for PEG and PAC.  Marland Kitchen Discussed opportunities for patient mentor, Multidisciplinary Clinic to meet with clinical support team. . Of Note:  Has appt with Dr. Nicolette Bang, [facility], this Wed morning to discuss TORS option; has appt with Dr. Maylon Peppers Wed afternoon at [facility]. Marland Kitchen Provided a tour of SIM and LINAC areas, explained treatment and arrival procedures. . Escorted them to [facility] Records. . I encouraged them to contact me with questions/concerns as treatments/procedures begin.  They verbalized understanding of information provided.    [name], RN, BSN Head & Neck Oncology Nurse [place] at [place] [phone]

## 2018-05-05 NOTE — Progress Notes (Signed)
Radiation Oncology         [phone] ________________________________  Initial outpatient Consultation  Name: Glenn Goodman MRN: [id number]  Date: 05/05/2018  DOB: 12-21-1960  CC:[name], MD  Melissa Montane, MD   REFERRING PHYSICIAN: Melissa Montane, MD  DIAGNOSIS:    ICD-10-CM   1. 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 male who presented in September that the left side of his neck was larger. He then presented to his PCP who ordered at CT neck.  This was performed on 03/05/2018.  This showed adenopathy in at least 2 masses of the left cervical neck.  No obvious primary in the throat though upon tumor board review we felt that there was fullness in the left oropharynx.  He then underwent biopsy with the following results:  04/07/18, Diagnosis Lymph node, needle/core biopsy, left cervical - METASTATIC SQUAMOUS CELL CARCINOMA INVOLVING A LYMPH NODE (1/1) EBV by in-situ hybridization is NEGATIVE. p16 immunohistochemistry is POSITIVE  04/22/18: Diagnosis Tonsil, biopsy, left, base of tongue,  INVASIVE KERATINIZING SQUAMOUS CELL CARCINOMA.  He underwent a PET scan on 04/29/2018.  This revealed a 1.3 cm lesion in the left palatine fossa and adjacent pharyngeal space.  There was also hypermetabolic activity in the left lymph node masses.   Nutrition Status Yes No Comments  Weight changes? [x]  []    About 5 to 10 pounds  Swallowing concerns? [x]  []  He reports soreness after his biopsy.   PEG? []  [x]     Referrals Yes No Comments  Social Work? []  [x]    Dentistry? [x]  []  Dr. Enrique Sack  04/30/18  Swallowing therapy? []  [x]    Nutrition? []  [x]    Med/Onc? []  []  Dr. Maylon Peppers 05/07/18   Safety Issues Yes No Comments  Prior radiation? []  [x]    Pacemaker/ICD? []  [x]    Possible current pregnancy? []  [x]    Is the patient on methotrexate? []  [x]     Tobacco/Marijuana/Snuff/ETOH abuse: He denies  There is an appointment to see Dr. Nicolette Bang scheduled for 05/07/18 and 05/07/18 Dr. Maylon Peppers   PREVIOUS RADIATION THERAPY: No  PAST MEDICAL HISTORY:  has a past medical history of Arthritis, OSA on CPAP, and Restless leg syndrome.    PAST SURGICAL HISTORY: Past Surgical History:  Procedure Laterality Date  . DIRECT LARYNGOSCOPY Left 04/22/2018   Dr. Janace Hoard  . FACIAL FRACTURE SURGERY Left 1986    FAMILY HISTORY: family history includes Hypertension in his mother; Prostate cancer in his father.  SOCIAL HISTORY:  reports that he has never smoked. He has never used smokeless tobacco. He reports previous alcohol use. He reports that he does not use drugs.  ALLERGIES: Patient has no known allergies.  MEDICATIONS:  Current Outpatient Medications  Medication Sig Dispense Refill  . famotidine (PEPCID) 20 MG tablet Take 20 mg by mouth daily as needed for heartburn or indigestion.    . pramipexole (MIRAPEX) 0.75 MG tablet Take 0.75 mg by mouth at bedtime.    . Cholecalciferol (VITAMIN D3) 50 MCG (2000 UT) TABS Take 2,000 Units by mouth daily.    Marland Kitchen GLUCOSAMINE-CHONDROITIN PO Take 1 tablet by  mouth daily.    . Magnesium 500 MG TABS Take 500 mg by mouth daily.    . sodium fluoride (PREVIDENT 5000 PLUS) 1.1 % CREA dental cream Apply to tooth brush. Brush teeth for 2 minutes. Spit out excess. DO NOT rinse afterwards. Repeat nightly. (Patient not taking: Reported on 05/05/2018) 1 Tube prn   No current facility-administered medications for this encounter.     REVIEW OF SYSTEMS: A 10+ POINT REVIEW OF SYSTEMS WAS OBTAINED including neurology, dermatology, psychiatry, cardiac, respiratory, lymph,  extremities, GI, GU, Musculoskeletal, constitutional, breasts, reproductive, HEENT.  All pertinent positives are noted in the HPI.  All others are negative.    PHYSICAL EXAM:  height is 5\' 8"  (1.727 m) and weight is 187 lb 6 oz (85 kg). His oral temperature is 97.7 F (36.5 C). His blood pressure is 139/83 and his pulse is 75. His respiration is 16 and oxygen saturation is 97%.   General: Alert and oriented, in no acute distress HEENT: Head is normocephalic. Extraocular movements are intact. Oropharynx is notable for some subtle slightly exophytic irregular tissue over the left posterior/superior tongue base Neck: Neck is notable for palpable adenopathy in the left level 2/3 region extending over approximately 5 cm in totality  heart: Regular in rate and rhythm with no murmurs, rubs, or gallops. Chest: Clear to auscultation bilaterally, with no rhonchi, wheezes, or rales. Abdomen: Soft, nontender, nondistended, with no rigidity or guarding. Extremities: No cyanosis or edema. Lymphatics: see Neck Exam Skin: No concerning lesions. Musculoskeletal: symmetric strength and muscle tone throughout. Neurologic: Cranial nerves II through XII are grossly intact. No obvious focalities. Speech is fluent. Coordination is intact. Psychiatric: Judgment and insight are intact. Affect is appropriate.   ECOG = 0  0 - Asymptomatic (Fully active, able to carry on all predisease activities without restriction)  1 - Symptomatic but completely ambulatory (Restricted in physically strenuous activity but ambulatory and able to carry out work of a light or sedentary nature. For example, light housework, office work)  2 - Symptomatic, <50% in bed during the day (Ambulatory and capable of all self care but unable to carry out any work activities. Up and about more than 50% of waking hours)  3 - Symptomatic, >50% in bed, but not bedbound (Capable of only limited self-care, confined to bed or chair 50% or more of  waking hours)  4 - Bedbound (Completely disabled. Cannot carry on any self-care. Totally confined to bed or chair)  5 - Death   Eustace Pen MM, Creech RH, Tormey DC, et al. [phone]). "Toxicity and response criteria of the [facility] Group". [place] Oncol. 5 (6): 649-55   LABORATORY DATA:  Lab Results  Component Value Date   WBC 8.9 04/07/2018   HGB 15.4 04/07/2018   HCT 47.0 04/07/2018   MCV 93.8 04/07/2018   PLT 306 04/07/2018   CMP  No results found for: NA, K, CL, CO2, GLUCOSE, BUN, CREATININE, CALCIUM, PROT, ALBUMIN, AST, ALT, ALKPHOS, BILITOT, GFRNONAA, GFRAA    No results found for: TSH   RADIOGRAPHY: Nm Pet Image Initial (pi) Skull Base To Thigh  Result Date: 04/29/2018 CLINICAL DATA:  Initial treatment strategy for squamous cell carcinoma of the neck on ultrasound guided core biopsy of left cervical adenopathy 04/07/2018. EXAM: NUCLEAR MEDICINE PET SKULL BASE TO THIGH TECHNIQUE: 10.1 mCi F-18 FDG was injected intravenously. Full-ring PET imaging was performed from the skull base to thigh after the radiotracer. CT data was obtained and used for attenuation  correction and anatomic localization. Fasting blood glucose: 23 mg/dl COMPARISON:  CT neck 04/05/2018 FINDINGS: Mediastinal blood pool activity: SUV max 2.2 NECK: Along the left palatine tonsil and adjacent pharyngeal mucosal space, there is accentuated metabolic activity suspicious for malignancy with maximum SUV 8.2. The abnormal activity measures approximately 1.3 by 1.1 cm. Symmetric accentuated activity along the lymphoid tissue at the tongue base, maximum SUV 5.3. Quite likely physiologic. Adjacent left level IIa adenopathy is present with an upper lymph node measuring 2.7 cm in short axis on image 42/3 with maximum SUV 10.8, and a more centrally necrotic lower left-sided node extending down to the level of the hyoid measuring up to 2.2 cm in short axis with maximum SUV 8.0. No right-sided hypermetabolic  activity or significant abnormal nodal activity below the level of the hyoid. Incidental CT findings: none CHEST: No significant abnormal hypermetabolic activity in this region. Incidental CT findings: none ABDOMEN/PELVIS: No significant abnormal hypermetabolic activity in this region. Incidental CT findings: Left kidney upper pole parapelvic cyst. Aortoiliac atherosclerotic vascular disease. Mild prostatomegaly. SKELETON: No significant abnormal hypermetabolic activity in this region. Incidental CT findings: none IMPRESSION: 1. Left palatine tonsil/adjacent pharyngeal mucosal space lesion is asymmetric, measures about 1.3 cm in long axis, and has a maximum SUV of 8.2, compatible with malignancy. There is associated hypermetabolic left level IIa adenopathy as shown on prior exams. 2. Symmetric accentuated activity in the lymphoid tissue at the tongue base, maximum SUV 5.3, quite likely physiologic given the symmetry. 3. No findings of metastatic disease Pred to the chest, abdomen/pelvis, or skeleton. Electronically Signed   By: Van Clines M.D.   On: 04/29/2018 14:13      IMPRESSION/PLAN: This is a delightful patient with HPV+ oropharyngeal  head and neck cancer. I discussed with him the option of TORS versus concurrent chemoradiotherapy.  He understands that if he undergoes TORS surgery he will likely need adjuvant radiotherapy.  Based on the extracapsular extension suspected by our ENT tumor board team on his imaging, he may also need chemotherapy adjuvantly.  He has a pending consult at [facility] with Dr. Nicolette Bang later this week and will also see medical oncology through [facility] health, Dr. Maylon Peppers.  We discussed the potential risks, benefits, and side effects of radiotherapy. We talked in detail about acute and late effects. We discussed that some of the most bothersome acute effects may be mucositis, dysgeusia, salivary changes, skin irritation, hair loss, dehydration, weight loss and fatigue. We  talked about late effects which include but are not necessarily limited to dysphagia, hypothyroidism, nerve injury, spinal cord injury, xerostomia, trismus, and neck edema. No guarantees of treatment were given. A consent form was signed and placed in the patient's medical record. The patient is enthusiastic about proceeding with treatment. I look forward to participating in the patient's care.    Simulation (treatment planning) will take place once his choice of treatment is made.  He understands that if he undergoes surgery there is a high likelihood I will see him back about 3-4 weeks after surgery to plan treatment adjuvantly  We also discussed that the treatment of head and neck cancer is a multidisciplinary process to maximize treatment outcomes and quality of life.  I will refer him to multidisciplinary clinic if he undergoes radiotherapy.  This would include seeing a nutritionist, physical therapist, swallowing therapist, Education officer, museum, and Development worker, community.  He has seen dentistry and I will communicate with Dr. Enrique Sack about the expected exposure to his tooth roots.  All  questions were answered to his satisfaction today.  I will stay in touch with the other specialists and look forward to participating in his care if needed  __________________________________________   Eppie Gibson, MD

## 2018-05-07 ENCOUNTER — Encounter: Payer: Self-pay | Admitting: *Deleted

## 2018-05-07 ENCOUNTER — Inpatient Hospital Stay: Payer: Commercial Managed Care - PPO | Attending: Hematology | Admitting: Hematology

## 2018-05-07 ENCOUNTER — Encounter: Payer: Self-pay | Admitting: Hematology

## 2018-05-07 VITALS — BP 120/77 | HR 71 | Temp 98.1°F | Resp 14 | Ht 68.0 in | Wt 191.1 lb

## 2018-05-07 DIAGNOSIS — Z8042 Family history of malignant neoplasm of prostate: Secondary | ICD-10-CM | POA: Diagnosis not present

## 2018-05-07 DIAGNOSIS — Z9989 Dependence on other enabling machines and devices: Secondary | ICD-10-CM

## 2018-05-07 DIAGNOSIS — G2581 Restless legs syndrome: Secondary | ICD-10-CM

## 2018-05-07 DIAGNOSIS — G4733 Obstructive sleep apnea (adult) (pediatric): Secondary | ICD-10-CM | POA: Diagnosis not present

## 2018-05-07 DIAGNOSIS — C760 Malignant neoplasm of head, face and neck: Secondary | ICD-10-CM

## 2018-05-07 DIAGNOSIS — Z79899 Other long term (current) drug therapy: Secondary | ICD-10-CM | POA: Diagnosis not present

## 2018-05-07 DIAGNOSIS — M1990 Unspecified osteoarthritis, unspecified site: Secondary | ICD-10-CM

## 2018-05-07 DIAGNOSIS — R590 Localized enlarged lymph nodes: Secondary | ICD-10-CM | POA: Diagnosis not present

## 2018-05-07 DIAGNOSIS — C090 Malignant neoplasm of tonsillar fossa: Secondary | ICD-10-CM | POA: Diagnosis not present

## 2018-05-07 DIAGNOSIS — C024 Malignant neoplasm of lingual tonsil: Secondary | ICD-10-CM | POA: Diagnosis not present

## 2018-05-07 DIAGNOSIS — C770 Secondary and unspecified malignant neoplasm of lymph nodes of head, face and neck: Secondary | ICD-10-CM | POA: Diagnosis not present

## 2018-05-09 ENCOUNTER — Encounter: Payer: Self-pay | Admitting: Radiation Oncology

## 2018-05-09 ENCOUNTER — Telehealth: Payer: Self-pay | Admitting: Hematology

## 2018-05-09 DIAGNOSIS — C109 Malignant neoplasm of oropharynx, unspecified: Secondary | ICD-10-CM | POA: Insufficient documentation

## 2018-05-09 DIAGNOSIS — C01 Malignant neoplasm of base of tongue: Secondary | ICD-10-CM

## 2018-05-09 NOTE — Progress Notes (Signed)
Oncology Nurse Navigator Documentation  Met with Mr. Glenn Goodman and his wife during initial consult with Dr. Maylon Peppers.     They had met with ENT [facility] earlier this morning. They voiced understanding of Dr. Lorette Ang discussion of chemoRT vs surgery with adjuvant RT. [name] indicated he is "leaning toward surgery", agreed to call me by Friday to confirm his treatment decision.  Note 05/09/18 1549:  I have not received call directly from [name].  Glenn Orem, RN, BSN Head & Neck Oncology Nurse [place] at [place] [phone]

## 2018-05-09 NOTE — Telephone Encounter (Signed)
Per 12/18 los No return set up yet. To be coordinated by H&N navigator [place].

## 2018-05-10 DIAGNOSIS — G4733 Obstructive sleep apnea (adult) (pediatric): Secondary | ICD-10-CM | POA: Diagnosis not present

## 2018-05-19 ENCOUNTER — Telehealth: Payer: Self-pay | Admitting: *Deleted

## 2018-05-19 NOTE — Telephone Encounter (Signed)
Oncology Nurse Navigator Documentation  Called Mr. Palka re his tmt decision.  He confirmed he has decided to move forward with TORS, indicated surgery w/ Dr. Nicolette Bang scheduled for 05/26/18.  I explained I will monitor his progress, coordinate return to [facility] for adjuvant tmt as needed.  He voiced understanding.  Drs. Erasmo Downer and [place] updated.  [name], RN, BSN Head & Neck Oncology Nurse [place] at [place] [phone]

## 2018-05-26 DIAGNOSIS — C090 Malignant neoplasm of tonsillar fossa: Secondary | ICD-10-CM | POA: Diagnosis not present

## 2018-05-26 DIAGNOSIS — G2581 Restless legs syndrome: Secondary | ICD-10-CM | POA: Diagnosis not present

## 2018-05-26 DIAGNOSIS — C099 Malignant neoplasm of tonsil, unspecified: Secondary | ICD-10-CM | POA: Diagnosis not present

## 2018-05-26 DIAGNOSIS — Z4659 Encounter for fitting and adjustment of other gastrointestinal appliance and device: Secondary | ICD-10-CM | POA: Diagnosis not present

## 2018-05-26 DIAGNOSIS — C770 Secondary and unspecified malignant neoplasm of lymph nodes of head, face and neck: Secondary | ICD-10-CM | POA: Diagnosis not present

## 2018-05-26 HISTORY — PX: OTHER SURGICAL HISTORY: SHX169

## 2018-05-26 HISTORY — PX: MODIFIED RADICAL NECK DISSECTION: SHX2045

## 2018-06-06 ENCOUNTER — Telehealth: Payer: Self-pay | Admitting: *Deleted

## 2018-06-06 NOTE — Telephone Encounter (Addendum)
Oncology Nurse Navigator Documentation  Returned pt's call.   He informed:  Recovering well from 05/26/18 TORS with Dr. Nicolette Bang, eating pureed/soft foods with minimal difficulty.   Per conversation with Dr. Nicolette Bang today and discussion of surgical bx results indicating clean surgical margins, extracapsular extension for 1 of 7 L cervical LN, recommendation he receive adjuvant RT.  Case to be presented at next [facility] H&N TB to see if adjuvant chemotherapy recommended. I confirmed my receipt of e-mail referral earlier this afternoon for him to see Dr. Isidore Moos.  He accepted 06/10/18 1:00 [place] appt preceded by 12:30 NE; I encouraged 12:15 arrival to register, reviewed registration process for RadOnc. I encouraged him to call me with questions/concerns prior to next weeks appt.  Drs. Isidore Moos and [place] updated.  [name], RN, BSN Head & Neck Oncology Nurse [place] at [place] [phone]

## 2018-06-09 ENCOUNTER — Encounter: Payer: Self-pay | Admitting: Radiation Oncology

## 2018-06-09 ENCOUNTER — Telehealth (HOSPITAL_COMMUNITY): Payer: Self-pay

## 2018-06-09 NOTE — Progress Notes (Signed)
Head and Neck Cancer Location of Tumor / Histology:  04/22/18 Diagnosis Tonsil, biopsy, left, base of tongue - INVASIVE KERATINIZING SQUAMOUS CELL CARCINOMA.  05/26/18 FINAL PATHOLOGIC DIAGNOSIS MICROSCOPIC EXAMINATION AND DIAGNOSIS A. OROPHARYNX, SOFT PALATE MARGIN, BIOPSY:    No malignancy identified. B. OROPHARYNX, ANTERIOR MARGIN, BIOPSY:    No malignancy identified. C. OROPHARYNX, TONGUE BASE MARGIN, BIOPSY:    No malignancy identified. D. SOFT TISSUE, DEEP MARGIN, BIOPSY:    No malignancy identified. E. OROPHARYNX POSTERIOR MARGIN, BIOPSY:    No malignancy identified. F. OROPHARYNX, LEFT GOLOSSOTONSILLAR SULCUS, RESECTION:    p16-positive squamous cell carcinoma.    Tumor size: 1.6 cm.    Perineural invasion identified.    Tumor focally involves soft tissue of tongue base margin.    Pathologic stage: pT1 pN1.    See tumor protocol summary below. G. LEFT SUBMANDIBULAR CONTENTS, EXCISION:    Two lymph nodes, negative for metastasis (0/2).    Benign salivary gland tissue. H. LEFT MODIFIED RADICAL NECK DISSECTION:    Metastatic carcinoma involving 1 of 4 lymph nodes (1/4).    Tumor deposit size: 6.8 cm.    Extranodal extension identified.  Patient presented with symptoms of: He noticed in September that the left side of his neck was larger. He then presented to his PCP who ordered at CT neck.   Biopsies of left cervical lymph node revealed: metastatic squamous cell carcinoma involving a lymph node.  Biopsies of left base of tongue revealed invasive keratinizing squamous cell carcinoma.   Nutrition Status Yes No Comments  Weight changes? [] ? [x] ?   Swallowing concerns? [x] ? [] ? He is eating pureed food.   PEG? [] ? [x] ?    Referrals Yes No Comments  Social Work? [] ? [x] ?   Dentistry? [x] ? [] ? Dr. Enrique Sack 04/30/18  Swallowing therapy? [] ? [x] ?   Nutrition? [] ? [x] ?   Med/Onc? [] ? [] ? Dr.  Maylon Peppers 05/07/18   Safety Issues Yes No Comments  Prior radiation? [] ? [x] ?   Pacemaker/ICD? [] ? [x] ?   Possible current pregnancy? [] ? [x] ?   Is the patient on methotrexate? [] ? [x] ?    Tobacco/Marijuana/Snuff/ETOH use: He has never smoked.   Past/Anticipated interventions by otolaryngology, if any:  04/10/18 Dr. Janace Hoard Impression & Plans:  Glenn Goodman is a 58 y.o. male with no evidence of primary so we discussed the need to search for that. We discussed a direct laryngoscopy with guided biopsies and examination. He was informed the risk and benefits of the procedure and options were discussed all questions were answered and consent was obtained. We will also get him set up with radiation oncology to get that started. They will decide about a PET scan. We discussed various issues regarding the cancer and treatment for roughly 25 minutes.  05/26/18 Dr. Nicolette Bang  1.Left modified radicalneck dissection levels 1b, 2, 3, 4, including sacrifice of theinternal jugular vein. 2.Transoral robotic surgery (TORS) for resection ofleftglossotonsillar sulcuscancer (left radical tonsillectomy). 3.. Local tissue rearrangement ofsternocleidomastoid muscle(6 x 3 cm) to cover carotid sheath structures  Next apt with Dr. Nicolette Bang is 06/18/18  Past/Anticipated interventions by medical oncology, if any:  05/07/18 Dr. Maylon Peppers ASSESSMENT & PLAN:  Stage I ([id number]) left tonsil SCCa, p16+ -I reviewed the patient's external records, including ENT clinic notes and path reports -I also independently reviewed the radiologic images of CT and PET, and agree with findings as documented -In summary, patient presented to ENT for a left neck mass in end of 02/2018, and underwent biopsy of the left cervical LN, which  show SCCa, p16+; in addition, panendoscopy with left tonsillar biopsy showed squamous cell carcinoma, indicating that the tonsil is most likely the primary site of disease; PET showed  FDG-avid left tonsil and left cervical LN's without evidence of metastatic disease -Patient met with Dr. Nicolette Bang at [facility] this morning, who discussed with the patient the benefits and risks of TORS -I also discussed with the patient at length the rationale for various treatment approaches, including upfront surgery followed by adjuvant therapy as indicated (RT or chemoradiation), and definitive chemoradiation -Based on current guidelines, surgery followed by adjuvant RT appears to be comparable to definitive chemoradiation; however, there are not yet definitive predictors based on imaging studies prior to surgery to determine those patients who may require adjuvant chemotherapy in addition to radiation -The patient's case was discussed at the tumor board; per radiology, the border of one of the cervical lymph nodes appearred to be somewhat indistinct, raising suspicion for possible ECE -I discussed with the patient that the best case scenario, he would likely require adjuvant RT in the absence of positive margin or ECE; however, if he has any high risk adverse features, then he will require adjuvant chemoradiation -I also counseled the patient regarding some of the risks, benefits, side-effects of cisplatin. Some of the short term side-effects included, though not limited to, including weight loss, life threatening infections, risk of allergic reactions, need for transfusions of blood products, nausea, vomiting, change in bowel habits, loss of hair, admission to hospital for various reasons, and risks of death. Long term side-effects are also discussed including risks of infertility, permanent damage to nerve function, hearing loss, chronic fatigue, kidney damage with possibility needing hemodialysis, and rare secondary malignancy including bone marrow disorders. -The patient expressed understanding, and would like to discuss this further with his wife before making a decision; however, he felt that he  was leaning toward upfront surgery first -Pending the patient decision, the H&N navigator will help arrange for follow-up with ENT or with medical oncology/radiation oncology All questions were answered. The patient knows to call the clinic with any problems, questions or concerns.  Tish Men, MD 05/07/2018 1:56 PM   Current Complaints / other details:    Wt Readings from Last 3 Encounters:  06/10/18 179 lb (81.2 kg)  05/07/18 191 lb 1.6 oz (86.7 kg)  05/05/18 187 lb 6 oz (85 kg)

## 2018-06-09 NOTE — Telephone Encounter (Signed)
Called and left messages with patient and his wife to call Dental Medicine back to get patient scheduled for appointment to deliver trays for upcoming RT. Molli Posey

## 2018-06-10 ENCOUNTER — Ambulatory Visit
Admission: RE | Admit: 2018-06-10 | Discharge: 2018-06-10 | Disposition: A | Discharge status: Home / Self Care | Payer: Commercial Managed Care - PPO | Source: Ambulatory Visit | Attending: Radiation Oncology | Admitting: Radiation Oncology

## 2018-06-10 ENCOUNTER — Other Ambulatory Visit: Payer: Self-pay

## 2018-06-10 ENCOUNTER — Encounter: Payer: Self-pay | Admitting: Radiation Oncology

## 2018-06-10 ENCOUNTER — Encounter (HOSPITAL_COMMUNITY): Payer: Self-pay | Admitting: Dentistry

## 2018-06-10 ENCOUNTER — Ambulatory Visit (HOSPITAL_COMMUNITY): Payer: Self-pay | Admitting: Dentistry

## 2018-06-10 ENCOUNTER — Encounter: Payer: Self-pay | Admitting: *Deleted

## 2018-06-10 VITALS — BP 130/89 | HR 73 | Temp 97.7°F | Resp 18 | Ht 68.0 in | Wt 179.0 lb

## 2018-06-10 VITALS — BP 123/79 | HR 59 | Temp 97.8°F

## 2018-06-10 DIAGNOSIS — Z79899 Other long term (current) drug therapy: Secondary | ICD-10-CM | POA: Insufficient documentation

## 2018-06-10 DIAGNOSIS — C01 Malignant neoplasm of base of tongue: Secondary | ICD-10-CM

## 2018-06-10 DIAGNOSIS — Z463 Encounter for fitting and adjustment of dental prosthetic device: Secondary | ICD-10-CM

## 2018-06-10 DIAGNOSIS — Z9889 Other specified postprocedural states: Secondary | ICD-10-CM | POA: Diagnosis not present

## 2018-06-10 DIAGNOSIS — Z01818 Encounter for other preprocedural examination: Secondary | ICD-10-CM

## 2018-06-10 DIAGNOSIS — C770 Secondary and unspecified malignant neoplasm of lymph nodes of head, face and neck: Secondary | ICD-10-CM | POA: Insufficient documentation

## 2018-06-10 NOTE — Patient Instructions (Signed)
FLUORIDE TRAYS PATIENT INSTRUCTIONS    Obtain Prevident 5000 prescription from the pharmacy.  Don't be surprised if it needs to be ordered.  Be sure to let the pharmacy know when you are close to needing a new refill for them to have it ready for you without interruption of Fluoride use.  The best time to use your Fluoride is before bedtime.  You must brush your teeth very well and floss before using the Fluoride in order to get the best use out of the Fluoride treatments.  Place Fluoride gel in the tray and spread gel around in the tray with your finger or cotton tip applicator.  Place the tray on your lower teeth and your upper teeth.  Make sure the trays are seated all the way.  Remember, they only fit one way on your teeth.  Insert for 5 full minutes.  At the end of the 5 minutes, take the trays out.  SPIT OUT excess.   Do NOT rinse your mouth!  Do NOT eat or drink after treatments for at least 30 minutes.  This is why the best time for your treatments is before bedtime.  Clean the inside of your Fluoride trays using COLD WATER and a toothbrush.  In order to keep your Trays from discoloring and free from odors, soak them overnight in denture cleaners such as Efferdent.  Do not use bleach or non denture products.  Store the trays in a safe dry place AWAY from any heat until your next treatment.  If anything happens to your Fluoride trays, or they don't fit as well after any dental work, please let us know as soon as possible.  

## 2018-06-10 NOTE — Progress Notes (Signed)
Radiation Oncology         [phone] ________________________________  Name: Glenn Goodman MRN: [id number]  Date: 06/10/2018  DOB: January 14, 1961  Follow-Up Visit Note  CC: [name], MD  Melissa Montane, MD  Diagnosis:       ICD-10-CM   1. 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  He underwent TORS with Dr. Nicolette Bang on 05/26/2018. 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 Tumor focally involved soft tissue of tongue base margin; location of tumor extension to margin appears 1 cm from the mucosal surface. There was metastatic carcinoma involving 1 of 4 left neck lymph nodes, measuring 6.8 cm, with extranodal extension. He was staged pT1, pN1.    Symptomatically, the patient is doing relatively well since surgery. He does have some swallowing issues and is eating pureed food. Tightness with neck extension.  ALLERGIES:  has No Known Allergies.  Meds: Current Outpatient Medications  Medication Sig Dispense Refill  . acetaminophen (TYLENOL) 325 MG tablet 2 tablets (650 mg total) by Per G Tube route every 6 (six) hours.    Marland Kitchen oxyCODONE (OXY IR/ROXICODONE) 5 MG immediate release tablet PLEASE SEE ATTACHED FOR DETAILED DIRECTIONS    . pramipexole (MIRAPEX) 0.5 MG tablet     . sodium fluoride (PREVIDENT 5000 PLUS) 1.1 % CREA dental cream Apply to tooth brush. Brush teeth for 2 minutes. Spit out excess. DO NOT rinse afterwards. Repeat nightly. 1 Tube prn  . Cholecalciferol (VITAMIN D3) 50 MCG (2000 UT) TABS Take  2,000 Units by mouth daily.    . famotidine (PEPCID) 20 MG tablet Take 20 mg by mouth daily as needed for heartburn or indigestion.    Marland Kitchen GLUCOSAMINE-CHONDROITIN PO Take 1 tablet by mouth daily.    . Magnesium 500 MG TABS Take 500 mg by mouth daily.     No current facility-administered medications for this encounter.     Physical Findings: Wt Readings from Last 3 Encounters:  06/10/18 179 lb (81.2 kg)  05/07/18 191 lb 1.6 oz (86.7 kg)  05/05/18 187 lb 6 oz (85 kg)    height is 5\' 8"  (1.727 m) and weight is 179 lb (81.2 kg). His oral temperature is 97.7 F (36.5 C). His blood pressure is 130/89 and his pulse is 73. His respiration is 18 and oxygen saturation is 98%.  General: Alert and oriented, in no acute distress. HEENT: Head is normocephalic. He's healing well in the left oropharynx. Mucous membranes are moist. No thrush. Neck: He has some post-operative firmness in the left upper neck. Left neck scar is healing very well. There is some firmness below the scar as well. Heart: Regular in rate and rhythm with no murmurs, rubs, or gallops. Chest: Clear to auscultation bilaterally, with no rhonchi, wheezes, or rales. Lymphatics: see Neck Exam Psychiatric: Judgment and insight are intact. Affect is appropriate.   Lab Findings: Lab Results  Component Value Date   WBC 8.9 04/07/2018   HGB 15.4 04/07/2018   HCT 47.0 04/07/2018   MCV 93.8 04/07/2018   PLT 306 04/07/2018  No results found for: TSH  Radiographic Findings: No results found.  Impression/Plan:  This is a delightful patient with HPV+ oropharyngeal head and neck cancer, status post TORS. Based on his lymph node involvement, +margin (I sent an email to clarify this with Dr Nicolette Bang), and extracapsular extension, I do recommend adjuvant radiotherapy for this patient, and he may also need chemotherapy adjuvantly. The patient will be discussed at tumor board tomorrow, and we will refer him back to Dr. Maylon Peppers to discuss  chemotherapy.  We discussed the potential risks, benefits, and side effects of radiotherapy. We talked in detail about acute and late effects. We discussed that some of the most bothersome acute effects may be mucositis, dysgeusia, salivary changes, skin irritation, hair loss, dehydration, weight loss and fatigue. We talked about late effects which include but are not necessarily limited to dysphagia, hypothyroidism, nerve injury, spinal cord injury, xerostomia, trismus, and neck edema. No guarantees of treatment were given. A consent form has been signed and placed in the patient's medical record. The patient is enthusiastic about proceeding with treatment. I look forward to participating in the patient's care.    Simulation (treatment planning) will take place February 4th at 10:00 AM, with treatment to begin on February 17th, 6 weeks out from surgery. Anticipate 6-7 weeks of radiotherapy directed to the oropharynx and bilateral neck.  We also discussed that the treatment of head and neck cancer is a multidisciplinary process to maximize treatment outcomes and quality of life. For this reasons the following referrals have been or will be made:  1. Medical oncology to discuss chemotherapy   2. Dentistry for advice on reducing risk of cavities, osteoradionecrosis, or other oral issues. Encouraged to continue regular followup with dentistry, and dental hygiene including fluoride rinses.   3. Nutritionist for nutrition support during and after treatment. We discussed that if he were to undergo concurrent chemoradiation, this would likely require him to have a PEG tube placed.  4. Speech language pathology for swallowing and/or speech therapy.  5. Social work for social support.  6. Financial counseling.   7. Physical therapy due to risk of lymphedema in neck and deconditioning.  8. Baseline labs including TSH.  All questions were answered to the patient's satisfaction today.  I spent 35  minutes face to face with the patient and more than 50% of that time was spent in counseling and/or coordination of care. _____________________________________   Eppie Gibson, MD  This document serves as a record of services personally performed by Eppie Gibson, MD. It was created on her behalf by Rae Lips, a trained medical scribe. The creation of this record is based on the scribe's personal observations and the provider's statements to them. This document has been checked and approved by the attending provider.

## 2018-06-10 NOTE — Progress Notes (Signed)
06/10/2018  Patient Name:   Glenn Goodman [facility] Date of Birth:   Dec 21, 1960 Medical Record Number: [id number]  BP 123/79 (BP Location: Right Arm)   Pulse (!) 59   Temp 97.8 F (36.6 C)   Glenn Goodman now presents for insertion of upper and lower fluoride trays and scatter protection devices. The patient underwent TORS procedure at [facility] with Dr. Nicolette Bang on 05/26/2018. Final pathology revealed P 16 positive squamous cell carcinoma of the left base of tongue with 104 lymph nodes positive for metastatic cancer with extranodal extension. Patient with anticipated postoperative radiation therapy and possible chemotherapy.    PROCEDURE: Appliances were tried in and adjusted as needed. [place] Trismus device was fabricated 40 mm using 20 sticks. Postop instructions were provided and a written and verbal format concerning the use and care of appliances. All questions were answered. Patient to return to clinic for periodic oral examination approximately 2 weeks into radiation therapy. The patient is to call for this appointment with dental medicine once his radiation therapy appointments are known. Patient to call if questions or problems arise before then.  Lenn Cal, DDS

## 2018-06-11 ENCOUNTER — Other Ambulatory Visit: Payer: Self-pay | Admitting: Radiation Oncology

## 2018-06-11 ENCOUNTER — Telehealth: Payer: Self-pay | Admitting: *Deleted

## 2018-06-11 ENCOUNTER — Encounter: Payer: Self-pay | Admitting: Radiation Oncology

## 2018-06-11 DIAGNOSIS — C01 Malignant neoplasm of base of tongue: Secondary | ICD-10-CM

## 2018-06-11 DIAGNOSIS — Z1329 Encounter for screening for other suspected endocrine disorder: Secondary | ICD-10-CM

## 2018-06-11 NOTE — Telephone Encounter (Signed)
CALLED PATIENT TO INFORM OF LAB APPT. FOR 06-24-18 @ 9:30 AM, SPOKE WITH PATIENT AND HE IS AWARE OF THIS APPT.

## 2018-06-12 NOTE — Progress Notes (Signed)
Oncology Nurse Navigator Documentation  Met with Glenn Goodman and his wife during follow-up with Dr. Isidore Moos to discuss adjuvant tmts s/p 05/26/18 TORS with Dr. Nicolette Bang, [facility]. They voiced understanding RT to be given with certainty, strong likelihood chemotherapy also because of surgical path results. He was provided 2/4 1000 CT SIM appt with expected 07/07/18 RT start. He voiced understanding f/u with Dr. Maylon Peppers to be scheduled. We discussed his attendance at next Tuesday morning's [facility] [place] with 0800 arrival to Radiation Waiting after lobby registration. I encouraged them to call me with questions/concerns.  [name], RN, BSN Head & Neck Oncology Nurse [place] at [place] [phone]

## 2018-06-16 NOTE — Progress Notes (Addendum)
[place] OFFICE PROGRESS NOTE  Patient Care Team: [name], MD as PCP - General (Family Medicine) Melissa Montane, MD as Consulting Physician (Otolaryngology) Eppie Gibson, MD as Attending Physician (Radiation Oncology) Tish Men, MD as Consulting Physician (Hematology) Leota Sauers, RN as Oncology Nurse Navigator  HEME/ONC OVERVIEW: 1. Stage I (pT1pN1M0) left tonsil SCCa, p16+ -02/2018: CT neck showed enlarged left Level II and II LN with central necrosis (largest 2.4cm); effacement of the left IJ vein noted  -03/2018: US-guided bx of left cervical LN; path showed SCCa, p16+ -04/2018: PET showed FDG-avid left palatine tonsil lesion measuring ~1.3cm with associated FDG-avid left Level IIa LN's; no evidence of metastatic disease -05/2018: TORS by Dr. Raphael Gibney at [facility]; path showed HPV-positive SCCa of the left glossotonsillar sulcus, 1.6cm, focal margin positive, PNI+, 1/6 LN's positive (largest 6.8cm) with ECE; pT1pN1   TREATMENT SUMMARY:  05/26/2018: TORS by Dr. Nicolette Bang at [facility]   07/07/2018 - present: adjuvant chemoradiation with weekly cisplatin 40mg /m2 (due to moderate hearing loss)  ASSESSMENT & PLAN:   Stage I (cT1N1M0) left tonsil SCCa, p16+ -I reviewed the patient's records in detail, including recent surgical notes and pathology reports -In summary, patient underwent towards by Dr. Kristine Garbe at [facility] in early 05/2018; path showed HPV-positive squamous of carcinoma of the left glossotonsillar sulcus, 1.6 cm, focal margin positive with ECE -I reviewed the pathology report and the NCCN guideline in detail with the patient -Given the focal positive margin and extracapsular extension, the recommendation is for adjuvant chemoradiation with cisplatin 100mg /m2 q3weeks  -Patient is cleared by dental medicine to proceed with treatment -In anticipation of chemoradiation, I have also ordered PEG and port placement -Baseline audiology evaluation  showed at least moderate hearing loss, particularly at high frequency; clinically, patient denies any hearing difficulty -Given the patient's age, the benefits of cisplatin likely outweigh risks (comparing to carboplatin or no adjuvant treatment); however, to reduce the risk of ototoxicity, we will plan to administer weekly cisplatin 40mg /m2 with close monitoring of any hearing changes  -We discussed some of the risks, benefits, side-effects of cisplatin. The intent is for cure. -Some of the short term side-effects included, though not limited to, including weight loss, life threatening infections, risk of allergic reactions, need for transfusions of blood products, nausea, vomiting, change in bowel habits, loss of hair, admission to hospital for various reasons, and risks of death.  -Long term side-effects are also discussed including risks of infertility, permanent damage to nerve function, hearing loss, chronic fatigue, kidney damage with possibility needing hemodialysis, and rare secondary malignancy including bone marrow disorders. -The patient is aware that the response rates discussed earlier is not guaranteed.  After a long discussion, patient made an informed decision to proceed with the prescribed plan of care  -Patient is currently scheduled to start RT on 07/07/2018; we will plan to start the 1st dose of cisplatin on 07/10/2018 -PRN anti-emetic medications: Zofran, Compazine, Ativan -PRN pain medication: IR morphine   Thrombocytosis -Likely reactive in the setting of recent surgery -Plts 430k today -We will monitor it for now   Left ear numbness -Possibly due to nerve injury from recent TORS -No tenderness of external ear or drainage on exam -I encourage the patient to follow up with Dr. Nat Christen for further evaluation   Odynophagia -Patient is currently taking PRN oxycodone and Tylenol -I have prescribed IR morphine for pain   Orders Placed This Encounter  Procedures  . IR  GASTROSTOMY TUBE MOD SED  Standing Status:   Future    Standing Expiration Date:   08/17/2019    Order Specific Question:   Reason for exam:    Answer:   Tonsil cancer planning chemoradiation    Order Specific Question:   Preferred Imaging Location?    Answer:   [facility]  . IR IMAGING GUIDED PORT INSERTION    Standing Status:   Future    Standing Expiration Date:   08/17/2019    Order Specific Question:   Reason for Exam (SYMPTOM  OR DIAGNOSIS REQUIRED)    Answer:   Tonsil cancer planning on chemoradiation    Order Specific Question:   Preferred Imaging Location?    Answer:   [facility]  . CBC with Differential ([place] Only)    Standing Status:   Standing    Number of Occurrences:   20    Standing Expiration Date:   06/19/2019  . Basic Metabolic Panel - [place] Only    Standing Status:   Standing    Number of Occurrences:   20    Standing Expiration Date:   06/19/2019  . Magnesium    Standing Status:   Standing    Number of Occurrences:   20    Standing Expiration Date:   06/19/2019  . PHYSICIAN COMMUNICATION ORDER    A baseline Audiogram is recommended prior to initiation of cisplatin chemotherapy.   All questions were answered. The patient knows to call the clinic with any problems, questions or concerns. No barriers to learning was detected.  A total of more than 60 minutes were spent face-to-face with the patient during this encounter and over half of that time was spent on counseling and coordination of care as outlined above.   Return on 07/09/2018 for labs, port flush, chemo education and clinic appt. Chemo 1st dose tentatively planned for 07/10/2018.   Tish Men, MD 06/18/2018 12:28 PM  CHIEF COMPLAINT: "I am doing a little better"  INTERVAL HISTORY: Mr. Alomar returns to clinic after recent TORS for discussion of adjuvant chemoradiation.  Patient reports that he has mild left angle of the jaw pain after this recent surgery, but it is  overall improving.  He also reports mild pain with swallowing, 2 out of 10, for which he is taking PRN oxycodone alternating with Tylenol with adequate pain control.  The pain is a little bit worse at night.  He also reports mild left ear numbness since recent surgery, but denies any ear pain or drainage.  He denies any fever, chill, night sweats, chest pain, dyspnea, abdominal pain, nausea, vomiting, diarrhea.  SUMMARY OF ONCOLOGIC HISTORY:   Squamous cell carcinoma of left tonsil ([place])   03/20/2018 Procedure    FNA of the left cervical LN in office    03/20/2018 Pathology Results    Non-diagnostic    04/07/2018 Procedure    US-guided left cervical LN biopsy/FNA     04/07/2018 Pathology Results    (Accession: [id number]) Lymph node, needle/core biopsy, left cervical - METASTATIC SQUAMOUS CELL CARCINOMA INVOLVING A LYMPH NODE (1/1) - SEE COMMENT  p16 IHC positive. EBV ISH negative.     04/22/2018 Pathology Results    (Accession: [id number]) Tonsil, biopsy, left, base of tongue - INVASIVE KERATINIZING SQUAMOUS CELL CARCINOMA.    04/29/2018 Imaging    PET:  IMPRESSION: 1. Left palatine tonsil/adjacent pharyngeal mucosal space lesion is asymmetric, measures about 1.3 cm in long axis, and has a maximum SUV of 8.2, compatible with  malignancy. There is associated hypermetabolic left level IIa adenopathy as shown on prior exams. 2. Symmetric accentuated activity in the lymphoid tissue at the tongue base, maximum SUV 5.3, quite likely physiologic given the symmetry.  3. No findings of metastatic disease Pred to the chest, abdomen/pelvis, or skeleton.    05/26/2018 Surgery    Radical tonsillectomy    05/26/2018 Pathology Results    PROCEDURE: Radical tonsillectomy TUMOR SITE: Glossotonsillar sulcus TUMOR LATERALITY: LEFT TUMOR FOCALITY: Unifocal TUMOR SIZE: GREATEST DIMENSION: 1.6 cm HISTOLOGIC TYPE: HPV-mediated squamous cell carcinoma MARGINS: Involved by invasive tumor  (focal) Specify location of closest margin: Tongue base (soft tissue) Note: Although an additional tongue base mucosal margin was submitted, location of tumor extension to margin appears 1 cm from the mucosal surface. LYMPHOVASCULAR INVASION: Not identified PERINEURAL INVASION: Present REGIONAL LYMPH NODES:  NUMBER OF LYMPH NODES INVOLVED: 1  NUMBER OF LYMPH NODES EXAMINED: 6  LATERALITY OF LYMPH NODES INVOLVED: Ipsilateral  SIZE OF LARGEST METASTATIC DEPOSIT: 6.8 cm  EXTRANODAL EXTENSION: Present   Distance from lymph node capsule: 6 millimeters PATHOLOGIC STAGE CLASSIFICATION (pTNM, AJCC 8TH Ed): pT1 pN1 (HPV-mediated oropharynx tumor)  pT1: Tumor 2 cm or smaller in greatest dimension  pN1: Metastasis in 4 or fewer lymph nodes ANCILLARY STUDIES:  p16 expression by immunohistochemistry: Positive  HPV by PCR: pending    05/26/2018 Cancer Staging    Staging form: Pharynx - HPV-Mediated Oropharynx, AJCC 8th Edition - Pathologic stage from 05/26/2018: Stage I (pT1, pN1, cM0, p16+) - Signed by Tish Men, MD on 06/18/2018    07/10/2018 -  Chemotherapy    The patient had palonosetron (ALOXI) injection 0.25 mg, 0.25 mg, Intravenous,  Once, 0 of 3 cycles CISplatin (PLATINOL) 198 mg in sodium chloride 0.9 % 500 mL chemo infusion, 100 mg/m2 = 198 mg, Intravenous,  Once, 0 of 3 cycles fosaprepitant (EMEND) 150 mg, dexamethasone (DECADRON) 12 mg in sodium chloride 0.9 % 145 mL IVPB, , Intravenous,  Once, 0 of 3 cycles  for chemotherapy treatment.      Malignant neoplasm of base of tongue ([place])   05/09/2018 Initial Diagnosis    Malignant neoplasm of base of tongue ([place])    05/09/2018 Cancer Staging    Staging form: Pharynx - HPV-Mediated Oropharynx, AJCC 8th Edition - Clinical: Stage I (cT1, cN1, cM0, p16+) - Signed by Eppie Gibson, MD on 05/09/2018    06/11/2018 Cancer Staging    Staging form: Pharynx - HPV-Mediated Oropharynx, AJCC 8th Edition - Pathologic: Stage I  (pT1, pN1, cM0, p16+) - Signed by Eppie Gibson, MD on 06/11/2018     REVIEW OF SYSTEMS:   Constitutional: ( - ) fevers, ( - )  chills , ( - ) night sweats Eyes: ( - ) blurriness of vision, ( - ) double vision, ( - ) watery eyes Ears, nose, mouth, throat, and face: ( - ) mucositis, ( + ) sore throat Respiratory: ( - ) cough, ( - ) dyspnea, ( - ) wheezes Cardiovascular: ( - ) palpitation, ( - ) chest discomfort, ( - ) lower extremity swelling Gastrointestinal:  ( - ) nausea, ( - ) heartburn, ( - ) change in bowel habits Skin: ( - ) abnormal skin rashes Lymphatics: ( - ) new lymphadenopathy, ( - ) easy bruising Neurological: ( - ) numbness, ( - ) tingling, ( - ) new weaknesses Behavioral/Psych: ( - ) mood change, ( - ) new changes  All other systems were reviewed with the patient and are negative.  I have reviewed the past medical history, past surgical history, social history and family history with the patient and they are unchanged from previous note.  ALLERGIES:  has No Known Allergies.  MEDICATIONS:  Current Outpatient Medications  Medication Sig Dispense Refill  . famotidine (PEPCID) 20 MG tablet Take 20 mg by mouth daily as needed for heartburn or indigestion.    Marland Kitchen oxyCODONE (OXY IR/ROXICODONE) 5 MG immediate release tablet PLEASE SEE ATTACHED FOR DETAILED DIRECTIONS    . pramipexole (MIRAPEX) 0.5 MG tablet     . sodium fluoride (PREVIDENT 5000 PLUS) 1.1 % CREA dental cream Apply to tooth brush. Brush teeth for 2 minutes. Spit out excess. DO NOT rinse afterwards. Repeat nightly. 1 Tube prn  . Cholecalciferol (VITAMIN D3) 50 MCG (2000 UT) TABS Take 2,000 Units by mouth daily.    Marland Kitchen dexamethasone (DECADRON) 4 MG tablet Take 2 tablets by mouth once a day on the day after chemotherapy and then take 2 tablets two times a day for 2 days. Take with food. 30 tablet 1  . GLUCOSAMINE-CHONDROITIN PO Take 1 tablet by mouth daily.    Marland Kitchen lidocaine-prilocaine (EMLA) cream Apply to affected area  once 30 g 3  . LORazepam (ATIVAN) 0.5 MG tablet Take 1 tablet (0.5 mg total) by mouth every 6 (six) hours as needed (Nausea or vomiting). 30 tablet 0  . Magnesium 500 MG TABS Take 500 mg by mouth daily.    . Morphine Sulfate (MORPHINE CONCENTRATE) 10 mg / 0.5 ml concentrated solution Take 0.5 mLs (10 mg total) by mouth every 4 (four) hours as needed for up to 30 days for severe pain. 120 mL 0  . ondansetron (ZOFRAN) 8 MG tablet Take 1 tablet (8 mg total) by mouth 2 (two) times daily as needed. Start on the third day after chemotherapy. 30 tablet 1  . prochlorperazine (COMPAZINE) 10 MG tablet Take 1 tablet (10 mg total) by mouth every 6 (six) hours as needed (Nausea or vomiting). 30 tablet 1   No current facility-administered medications for this visit.     PHYSICAL EXAMINATION: ECOG PERFORMANCE STATUS: 0 - Asymptomatic  Today's Vitals   06/18/18 1120 06/18/18 1123  BP: 132/87   Pulse: 86   Resp: 17   Temp: 98.3 F (36.8 C)   TempSrc: Oral   SpO2: 97%   Weight: 180 lb (81.6 kg)   Height: 5\' 8"  (1.727 m)   PainSc:  0-No pain   Body mass index is 27.37 kg/m.  Filed Weights   06/18/18 1120  Weight: 180 lb (81.6 kg)    GENERAL: alert, no distress and comfortable SKIN: skin color, texture, turgor are normal, no rashes or significant lesions EYES: conjunctiva are pink and non-injected, sclera clear OROPHARYNX: no exudate, no erythema; lips, buccal mucosa, and tongue normal  NECK: firmness in the left neck, neck dissection scar healing well without tenderness or drainage  LYMPH:  No other palpable lymphadenopathy in the cervical or axillary  LUNGS: clear to auscultation with normal breathing effort HEART: regular rate & rhythm and no murmurs and no lower extremity edema ABDOMEN: soft, non-tender, non-distended, normal bowel sounds Musculoskeletal: no cyanosis of digits and no clubbing  PSYCH: alert & oriented x 3, fluent speech NEURO: no focal motor/sensory deficits  LABORATORY  DATA:  I have reviewed the data as listed    Component Value Date/Time   NA 142 06/18/2018 1105   K 4.0 06/18/2018 1105   CL 104 06/18/2018 1105   CO2 28  06/18/2018 1105   GLUCOSE 110 (H) 06/18/2018 1105   BUN 20 06/18/2018 1105   CREATININE 0.92 06/18/2018 1105   CALCIUM 9.6 06/18/2018 1105   PROT 7.2 06/18/2018 1105   ALBUMIN 3.8 06/18/2018 1105   AST 17 06/18/2018 1105   ALT 38 06/18/2018 1105   ALKPHOS 77 06/18/2018 1105   BILITOT 0.4 06/18/2018 1105   GFRNONAA >60 06/18/2018 1105   GFRAA >60 06/18/2018 1105    No results found for: SPEP, UPEP  Lab Results  Component Value Date   WBC 9.1 06/18/2018   NEUTROABS 6.1 06/18/2018   HGB 15.4 06/18/2018   HCT 46.2 06/18/2018   MCV 92.8 06/18/2018   PLT 430 (H) 06/18/2018      Chemistry      Component Value Date/Time   NA 142 06/18/2018 1105   K 4.0 06/18/2018 1105   CL 104 06/18/2018 1105   CO2 28 06/18/2018 1105   BUN 20 06/18/2018 1105   CREATININE 0.92 06/18/2018 1105      Component Value Date/Time   CALCIUM 9.6 06/18/2018 1105   ALKPHOS 77 06/18/2018 1105   AST 17 06/18/2018 1105   ALT 38 06/18/2018 1105   BILITOT 0.4 06/18/2018 1105       RADIOGRAPHIC STUDIES: I have personally reviewed the radiological images as listed below and agreed with the findings in the report. No results found.

## 2018-06-17 ENCOUNTER — Encounter: Payer: Self-pay | Admitting: Physical Therapy

## 2018-06-17 ENCOUNTER — Inpatient Hospital Stay: Payer: Commercial Managed Care - PPO | Admitting: Nutrition

## 2018-06-17 ENCOUNTER — Ambulatory Visit: Payer: Commercial Managed Care - PPO | Admitting: Physical Therapy

## 2018-06-17 ENCOUNTER — Encounter: Payer: Self-pay | Admitting: *Deleted

## 2018-06-17 ENCOUNTER — Other Ambulatory Visit: Payer: Self-pay

## 2018-06-17 ENCOUNTER — Ambulatory Visit
Admission: RE | Admit: 2018-06-17 | Discharge: 2018-06-17 | Disposition: A | Discharge status: Home / Self Care | Payer: Commercial Managed Care - PPO | Source: Ambulatory Visit | Attending: Radiation Oncology | Admitting: Radiation Oncology

## 2018-06-17 ENCOUNTER — Telehealth: Payer: Self-pay | Admitting: Radiation Oncology

## 2018-06-17 ENCOUNTER — Ambulatory Visit: Payer: Commercial Managed Care - PPO | Attending: Family Medicine

## 2018-06-17 DIAGNOSIS — C099 Malignant neoplasm of tonsil, unspecified: Secondary | ICD-10-CM | POA: Insufficient documentation

## 2018-06-17 DIAGNOSIS — R1310 Dysphagia, unspecified: Secondary | ICD-10-CM | POA: Diagnosis not present

## 2018-06-17 DIAGNOSIS — H938X2 Other specified disorders of left ear: Secondary | ICD-10-CM | POA: Insufficient documentation

## 2018-06-17 DIAGNOSIS — Z483 Aftercare following surgery for neoplasm: Secondary | ICD-10-CM | POA: Insufficient documentation

## 2018-06-17 DIAGNOSIS — R293 Abnormal posture: Secondary | ICD-10-CM | POA: Insufficient documentation

## 2018-06-17 DIAGNOSIS — R7989 Other specified abnormal findings of blood chemistry: Secondary | ICD-10-CM | POA: Insufficient documentation

## 2018-06-17 NOTE — Progress Notes (Signed)
Patient was seen during head and neck clinic.  58 year old male with left base of tongue/tonsillar sulcus; stage I, P 16+.  Patient had TORS at [facility] on May 26, 2018 with perineural invasion and extranodal extension 1/4 left neck nodes.  He is to receive concurrent adjuvant chemoradiation therapy to oropharynx/bilateral neck with 6 to 7 weeks radiation and possible high-dose cisplatin every 21 days. PEG/Port-A-Cath placement pending.  Past medical history also includes restless leg syndrome and OSA on CPAP.  Medications include vitamin D3, Pepcid, glucosamine, and magnesium.  Labs were reviewed.  Height: 5 feet 8 inches. Weight: 179 pounds. Usual body weight: 188 pounds per patient. BMI: 27.29.  Estimated nutrition needs: 2200-2400 cal, 110-125 g protein, 2.4 L fluid.  Patient is advancing diet to soft foods after eating pured foods after TORS.  He is drinking Ensure Plus. He appears to be tolerating a good variety of meats, vegetables and even bread. Currently denies nutrition impact symptoms.  Nutrition diagnosis: Predicted suboptimal energy intake related to new diagnosis of tonsil cancer as evidenced by history or presence of a condition for which research shows an increased incidence of suboptimal energy intake.  Intervention: Patient educated to consume small, frequent meals and snacks utilizing high-calorie, high-protein foods. Recommended patient try oral nutrition supplements and provided samples and coupons. Briefly reviewed feedings through PEG. Provided strategies for dealing with constipation and nausea and vomiting. Questions were answered.  Teach back method used.  Contact information given.  Fact sheets provided.  Monitoring, evaluation, goals: Patient will tolerate increased calories and protein to minimize weight loss throughout treatment.  Next visit: To be scheduled weekly with treatments.  **Disclaimer: This note was dictated  with voice recognition software. Similar sounding words can inadvertently be transcribed and this note may contain transcription errors which may not have been corrected upon publication of note.**

## 2018-06-17 NOTE — Progress Notes (Signed)
Oncology Nurse Navigator Documentation  Met with Mr. Prentiss upon his arrival for H&N MDC.  He was accompanied by his wife.    Provided verbal and written overview of MDC, the clinicians who will be seeing him, encouraged him to ask questions during his time with them.  He was seen by Nutrition, SLP, PT, SW and RadOnc Financial Administrative Support Specialist.  Spoke with him at end of MDC, addressed questions. I encouraged him to call me with needs/concerns.  Rick Diehl, RN, BSN Head & Neck Oncology Navigator [facility] at [place] [phone]  

## 2018-06-17 NOTE — Progress Notes (Signed)
Head & Neck Multidisciplinary Clinic Clinical Social Work  Clinical Social Work met with patient at head & neck multidisciplinary clinic to offer support and assess for psychosocial needs.  Mr. Zaman reported his spouse just left appointment, but identified her as a great support- she is a Marine scientist and he states she has a very nurturing personality.  The patient works as an Materials engineer and reported his employer has been very supportive throughout this experience.  CSW and patient discussed emotional implications of a cancer diagnosis and treatment experience.  Clinical Social Work briefly discussed Clinical Social Work role and [employer] support programs/services.  Clinical Social Work encouraged patient to call with any additional questions or concerns.   Maryjean Morn, MSW, LCSW, OSW-C Clinical Social Worker [facility] [phone]

## 2018-06-17 NOTE — Patient Instructions (Signed)
SWALLOWING EXERCISES Do these 6 of the 7 days per week until 6 months after your last day of radiation, then 2 times per week afterwards  1. Effortful Swallows - Press your tongue against the roof of your mouth for 3 seconds, then squeeze the muscles in your neck while you swallow your saliva or a sip of water - Repeat 20 times, 2-3 times a day, and use whenever you eat or drink  2. Masako Swallow - swallow with your tongue sticking out - Stick tongue out past your teeth and gently bite tongue with your teeth - Swallow, while holding your tongue with your teeth - Repeat 20 times, 2-3 times a day *use a wet spoon if your mouth gets dry*  3. Pitch Raise - Repeat "he", once per second in as high of a pitch as you can - Repeat 20 times, 2-3 times a day  4. Shaker Exercise - head lift - Lie flat on your back in your bed or on a couch without pillows - Raise your head and look at your feet - KEEP YOUR SHOULDERS DOWN - HOLD FOR 45-60 SECONDS, then lower your head back down - Repeat 3 times, 2-3 times a day  5. Mendelsohn Maneuver - "half swallow" exercise - Start to swallow, and keep your Adam's apple up by squeezing hard with the            muscles of the throat - Hold the squeeze for 5-7 seconds and then relax - Repeat 20 times, 2-3 times a day *use a wet spoon if your mouth gets dry*  6. Breath Hold - Say "HUH!" loudly, then hold your breath for 3 seconds at your voice box - Repeat 20 times, 2-3 times a day  7. Chin pushback - Open your mouth  - Place your fist UNDER your chin near your neck, and push back with your fist for 5 seconds - Repeat 10 times, 2-3 times a day

## 2018-06-17 NOTE — Therapy (Signed)
[place], [place], [address] Phone: [phone]   Fax:  [phone]  Physical Therapy Evaluation  Patient Details  Name: Glenn Goodman MRN: [id number] Date of Birth: 1960/05/25 Referring Provider (PT): Reita May Date: 06/17/2018  PT End of Session - 06/17/18 1012    Visit Number  1    Number of Visits  1    Authorization Type  [facility] no auth    PT Start Time  0905    PT Stop Time  0924    PT Time Calculation (min)  19 min    Activity Tolerance  Patient tolerated treatment well    Behavior During Therapy  [facility] for tasks assessed/performed       Past Medical History:  Diagnosis Date  . Arthritis   . OSA on CPAP   . Restless leg syndrome     Past Surgical History:  Procedure Laterality Date  . DIRECT LARYNGOSCOPY Left 04/22/2018   Dr. Janace Hoard  . FACIAL FRACTURE SURGERY Left 1986  . MODIFIED RADICAL NECK DISSECTION Left 05/26/2018   Dr. Nicolette Bang, [facility]  . transoral robotic surgery  05/26/2018   TORS, Dr. Nicolette Bang [facility]    There were no vitals filed for this visit.   Subjective Assessment - 06/17/18 0835    Subjective  It hurts when I turn my head. The scar is tight and the doctor said I could start scar masssage at 3 weeks which is today.     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, TORS 05/26/18, perineural invasion, extranodal extension 1 of 4 L neck nodes, concurrent adjuvant chemo and radiation will begin around 07/07/18 to oropharynx/bilateral neck, no extractions needed, PEG/PAC placement pending, non smoker/drinker    Patient Stated Goals  meet all providers at head and neck clinic and get information    Currently in Pain?  Yes    Pain Score  2     Pain Location  Throat    Pain Orientation  Anterior    Pain Descriptors / Indicators  Sore    Pain Type  Acute pain    Pain Onset  1 to 4 weeks ago    Pain Frequency  Constant    Aggravating  Factors   swallowing    Pain Relieving Factors  eating    Effect of Pain on Daily Activities  hurts to swallow         [facility] PT Assessment - 06/17/18 0001      Assessment   Medical Diagnosis  L base of tongue/tonsillar cancer    Referring Provider (PT)  [place]    Onset Date/Surgical Date  05/26/18    Hand Dominance  Right    Prior Therapy  none      Precautions   Precautions  Other (comment)    Precaution Comments  active cancer      Balance Screen   Has the patient fallen in the past 6 months  No    Has the patient had a decrease in activity level because of a fear of falling?   No    Is the patient reluctant to leave their home because of a fear of falling?   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[employer]    Leisure  does not exercise      Cognition   Overall Cognitive Status  Within Functional Limits for tasks assessed      Observation/Other Assessments   Observations  healing scar on left neck      Functional Tests   Functional tests  Sit to Stand      Sit to Stand   Comments  30 sec sit ot stnad 12 reps which is below avg for his age      Posture/Postural Control   Posture/Postural Control  Postural limitations    Postural Limitations  Forward head;Rounded Shoulders      ROM / Strength   AROM / PROM / Strength  AROM      AROM   AROM Assessment Site  Shoulder;Cervical    Right/Left Shoulder  --   WFL   Cervical Flexion  WFL    Cervical Extension  50% limited with pain from scar    Cervical - Right Side Bend  75% limited by scar    Cervical - Left Side Bend  50% limited by scar    Cervical - Right Rotation  75% limited by scar    Cervical - Left Rotation  25% limited  by scar      Ambulation/Gait   Ambulation/Gait  Yes    Ambulation/Gait Assistance  7: Independent    Ambulation Distance (Feet)  15 Feet    Gait Pattern  Within Functional Limits        LYMPHEDEMA/ONCOLOGY QUESTIONNAIRE - 06/17/18 1059      Lymphedema Assessments   Lymphedema Assessments  Head and Neck      Head and Neck   4 cm superior to sternal notch around neck  42.6 cm    6 cm superior to sternal notch around neck  43 cm    8 cm superior to sternal notch around neck  43.2 cm             Objective measurements completed on examination: See above findings.              PT Education - 06/17/18 0841    Education Details  Neck ROM, posture, breathing, walking, CURE article on staying active, "Why exercise?" flyer, lymphedema and PT info    Person(s) Educated  Patient    Methods  Explanation;Handout    Comprehension  Verbalized understanding              Head and Neck Clinic Goals - 06/17/18 1013      Patient will be able to verbalize understanding of a home exercise program for cervical range of motion, posture, and walking.    Status  Achieved      Patient will be able to verbalize understanding of proper sitting and standing posture.    Status  Achieved      Patient will be able to verbalize understanding of lymphedema risk and availability of treatment for this condition.    Status  Achieved         Plan - 06/17/18 1059    Clinical Impression Statement  Pt presents to head and neck clinic with recent diagnosis of L tonsillar/base of tongue cancer. He underwent surgery on 05/26/18. He will begin chemo and radiation soon. Per baseline assessment his shoulder ROM is WFL. He has limited  neck ROM secondary to scar which is still healing. It has been three weeks and he follows up with his doctor tomorrow to learn when he can start scar massage. He was educated in head and neck ROM exercises today to begin and was educated in  correct way to perform scar mobilization once he is cleared to do so. He was educated that physical therapy can help if he develops any swelling, weakness or is unable to improve ROM with neck stretches given today.     History and Personal Factors relevant to plan of care:  recent surgery, concurrent chemo/radiation    Clinical Presentation  Evolving    Clinical Presentation due to:  pt to begin chemo/radiation    Clinical Decision Making  Moderate    PT Frequency  One time visit    PT Treatment/Interventions  ADLs/Self Care Home Management;Patient/family education;Therapeutic exercise    PT Home Exercise Plan  head and neck stretches    Consulted and Agree with Plan of Care  Patient       Patient will benefit from skilled therapeutic intervention in order to improve the following deficits and impairments:  Decreased knowledge of precautions, Decreased range of motion, Increased fascial restricitons, Decreased scar mobility  Visit Diagnosis: Abnormal posture  Aftercare following surgery for neoplasm     Problem List Patient Active Problem List   Diagnosis Date Noted  . Malignant neoplasm of base of tongue ([place]) 05/09/2018  . 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

## 2018-06-17 NOTE — Therapy (Signed)
[place] [address] [place], [place], [address] Phone: [phone]   Fax:  [phone]  Speech Language Pathology Evaluation  Patient Details  Name: Glenn Goodman MRN: [id number] Date of Birth: [birth year] Referring Provider (SLP): Eppie Gibson, MD   Encounter Date: 06/17/2018  End of Session - 06/17/18 1714    Visit Number  1    Number of Visits  7    Date for SLP Re-Evaluation  12/16/18    SLP Start Time  0900    SLP Stop Time   0945    SLP Time Calculation (min)  45 min    Activity Tolerance  Patient tolerated treatment well       Past Medical History:  Diagnosis Date  . Arthritis   . OSA on CPAP   . Restless leg syndrome     Past Surgical History:  Procedure Laterality Date  . DIRECT LARYNGOSCOPY Left 04/22/2018   Dr. Janace Hoard  . FACIAL FRACTURE SURGERY Left 1986  . MODIFIED RADICAL NECK DISSECTION Left 05/26/2018   Dr. Nicolette Bang, [facility]  . transoral robotic surgery  05/26/2018   TORS, Dr. Nicolette Bang [facility]    There were no vitals filed for this visit.  Subjective Assessment - 06/17/18 0931    Subjective  Pt with PEG placement pending.         SLP Evaluation [facility] - 06/17/18 0932      SLP Visit Information   SLP Received On  06/17/18    Referring Provider (SLP)  Eppie Gibson, MD    Medical Diagnosis  lt tongue base/tonsilar sulcus CA      Subjective   Patient/Family Stated Goal  "Keep my swallowing normal"      Pain Assessment   Currently in Pain?  Yes    Pain Score  2     Pain Location  Throat    Pain Orientation  Anterior    Pain Type  Acute pain    Pain Onset  1 to 4 weeks ago    Pain Frequency  Constant    Pain Relieving Factors  eating/swallowing      General Information   HPI  Lt base of tongue/tonsilar sulcus; Stage I. TORS [facility] 05-26-18 with perineural invasion, extranodal extension  1/4 lt neck nds: RT begining 07-07-18; est chemo start date 07-07-18.      Cognition   Overall Cognitive Status  Within Functional Limits for tasks assessed      Oral Motor/Sensory Function   Overall Oral Motor/Sensory Function  Impaired    Lingual ROM  Reduced left    Lingual Symmetry  Abnormal symmetry left    Lingual Strength  Reduced Left    Lingual Coordination  WFL    Velum  Impaired left    Overall Oral Motor/Sensory Function  Assume reduced ROM due to edema/healing from TORS      Motor Speech   Overall Motor Speech  Appears within functional limits for tasks assessed       Pt currently tolerates regular diet/thin liquids. Pt reports rare minimal residue on side with tumor (reportedly in posterior of oral cavity). POs: Pt ate [place] sandwich and drank water without overt s/s aspiration. Thyroid elevation appeared adequate, and swallows appeared timely. Oral residue noted as pt needed to clear with liquid wash. Pt's swallow deemed WFL at this time.   Because data states the risk for dysphagia during and after radiation treatment is high due to undergoing radiation tx, SLP taught pt  about the possibility of reduced/limited ability for PO intake during rad tx. SLP encouraged pt to continue swallowing POs as far into rad tx as possible, even ingesting POs and/or completing HEP shortly after administration of pain meds.   SLP educated pt re: changes to swallowing musculature after rad tx, and why adherence to dysphagia HEP provided today and PO consumption was necessary to inhibit muscular disuse atrophy and to reduce muscle fibrosis following rad tx. Pt demonstrated understanding of these things to SLP.    SLP then developed a HEP for pt and pt was instructed how to perform exercises involving lingual, vocal, and pharyngeal strengthening. SLP performed each exercise and pt return demonstrated each exercise. SLP ensured pt performance was correct prior to moving on to next exercise. Pt was instructed to complete this program 2-3 times a day, 6-7 days/week until 6 months after  his last rad tx, then x2-3 a week after that.                 SLP Education - 06/17/18 1713    Education Details  HEP procedure, late effects head/neck radiation, benefits of pain meds with HEP and POs    Person(s) Educated  Patient    Methods  Explanation;Demonstration;Verbal cues    Comprehension  Verbalized understanding;Verbal cues required;Returned demonstration;Need further instruction       SLP Short Term Goals - 06/17/18 1716      SLP SHORT TERM GOAL #1   Title  pt will complete HEP with rare min A over two sessions    Time  2    Period  --   visits-for all STGs (visit 3)   Status  New      SLP SHORT TERM GOAL #2   Title  pt will tell SLP why he is completing HEP     Time  1    Period  --   session 2   Status  New      SLP SHORT TERM GOAL #3   Title  pt will tell SLP how a food journal can facilitate return to most normal diet    Time  3    Period  --   (session 4)   Status  New       SLP Long Term Goals - 06/17/18 1718      SLP LONG TERM GOAL #1   Title  pt will complete HEP with modified independence x2 sessions    Time  4    Period  --   visits- for all LTGs (visit #5)   Status  New      SLP LONG TERM GOAL #2   Title  pt will tell SLP 3 overt s/s of aspiration PNA with modified independence    Time  4   visit #5   Status  New       Plan - 06/17/18 1714    Clinical Impression Statement  Pt with oropharyngeal swallowing essentially WNL, however the probability of swallowing difficulty increases dramatically with the initiation of chemo and radiation therapy. Pt will need to be followed by SLP for regular assessment of accurate HEP completion as well as for safety with POs both during and following treatment/s.    Speech Therapy Frequency  --   once approx every four weeks   Duration  --   6 sessions (7 total)   Treatment/Interventions  Aspiration precaution training;Diet toleration management by SLP;Pharyngeal strengthening  exercises;Internal/external aids;Trials of upgraded texture/liquids;Compensatory strategies;Patient/family education;SLP instruction  and feedback;Cueing hierarchy;Environmental controls    Potential to Achieve Goals  Good    SLP Home Exercise Plan  provided today    Consulted and Agree with Plan of Care  Patient       Patient will benefit from skilled therapeutic intervention in order to improve the following deficits and impairments:   Dysphagia, unspecified type    Problem List Patient Active Problem List   Diagnosis Date Noted  . Malignant neoplasm of base of tongue ([place]) 05/09/2018  . 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 [place], [place], [address] Phone: [phone]   Fax:  [phone]  Name: Glenn Goodman MRN: [id number] Date of Birth: January 24, 1961

## 2018-06-17 NOTE — Telephone Encounter (Signed)
Per patient's navigators request, I met with the patient today to introduce myself and offer financial resources. Patient advised he did not anticipate needing any financial assistance at this time but would certainly reach out to me if he does need assistance. Patient requested that I provide a benefit summary per his insurance, I advised patient I would be happy to do so. I am mailing out a benefit summary list to the address on file. Patient confirmed all demo on file is accurate. I advised patient to reach out if he needs any further assistance with the financial resources. Patient verbally agreed.

## 2018-06-18 ENCOUNTER — Inpatient Hospital Stay: Payer: Commercial Managed Care - PPO

## 2018-06-18 ENCOUNTER — Telehealth: Payer: Self-pay | Admitting: *Deleted

## 2018-06-18 ENCOUNTER — Encounter: Payer: Self-pay | Admitting: Hematology

## 2018-06-18 ENCOUNTER — Encounter: Payer: Self-pay | Admitting: *Deleted

## 2018-06-18 ENCOUNTER — Telehealth: Payer: Self-pay | Admitting: Hematology

## 2018-06-18 ENCOUNTER — Encounter: Payer: Self-pay | Admitting: Nutrition

## 2018-06-18 ENCOUNTER — Other Ambulatory Visit: Payer: Self-pay | Admitting: *Deleted

## 2018-06-18 ENCOUNTER — Inpatient Hospital Stay (HOSPITAL_BASED_OUTPATIENT_CLINIC_OR_DEPARTMENT_OTHER): Payer: Commercial Managed Care - PPO | Admitting: Hematology

## 2018-06-18 VITALS — BP 132/87 | HR 86 | Temp 98.3°F | Resp 17 | Ht 68.0 in | Wt 180.0 lb

## 2018-06-18 DIAGNOSIS — C01 Malignant neoplasm of base of tongue: Secondary | ICD-10-CM

## 2018-06-18 DIAGNOSIS — R293 Abnormal posture: Secondary | ICD-10-CM | POA: Diagnosis not present

## 2018-06-18 DIAGNOSIS — C098 Malignant neoplasm of overlapping sites of tonsil: Secondary | ICD-10-CM | POA: Diagnosis not present

## 2018-06-18 DIAGNOSIS — D473 Essential (hemorrhagic) thrombocythemia: Secondary | ICD-10-CM

## 2018-06-18 DIAGNOSIS — C099 Malignant neoplasm of tonsil, unspecified: Secondary | ICD-10-CM

## 2018-06-18 DIAGNOSIS — R1310 Dysphagia, unspecified: Secondary | ICD-10-CM | POA: Insufficient documentation

## 2018-06-18 DIAGNOSIS — H938X2 Other specified disorders of left ear: Secondary | ICD-10-CM | POA: Diagnosis not present

## 2018-06-18 DIAGNOSIS — C760 Malignant neoplasm of head, face and neck: Secondary | ICD-10-CM

## 2018-06-18 DIAGNOSIS — Z483 Aftercare following surgery for neoplasm: Secondary | ICD-10-CM | POA: Diagnosis not present

## 2018-06-18 DIAGNOSIS — D75839 Thrombocytosis, unspecified: Secondary | ICD-10-CM | POA: Insufficient documentation

## 2018-06-18 DIAGNOSIS — H903 Sensorineural hearing loss, bilateral: Secondary | ICD-10-CM | POA: Insufficient documentation

## 2018-06-18 DIAGNOSIS — Z1329 Encounter for screening for other suspected endocrine disorder: Secondary | ICD-10-CM

## 2018-06-18 DIAGNOSIS — R7989 Other specified abnormal findings of blood chemistry: Secondary | ICD-10-CM | POA: Diagnosis not present

## 2018-06-18 LAB — CMP (CANCER CENTER ONLY)
ALT: 38 U/L (ref 0–44)
AST: 17 U/L (ref 15–41)
Albumin: 3.8 g/dL (ref 3.5–5.0)
Alkaline Phosphatase: 77 U/L (ref 38–126)
Anion gap: 10 (ref 5–15)
BILIRUBIN TOTAL: 0.4 mg/dL (ref 0.3–1.2)
BUN: 20 mg/dL (ref 6–20)
CHLORIDE: 104 mmol/L (ref 98–111)
CO2: 28 mmol/L (ref 22–32)
Calcium: 9.6 mg/dL (ref 8.9–10.3)
Creatinine: 0.92 mg/dL (ref 0.61–1.24)
GFR, Est AFR Am: >60 mL/min (ref >60)
Glucose, Bld: 110 mg/dL — ABNORMAL HIGH (ref 70–99)
POTASSIUM: 4 mmol/L (ref 3.5–5.1)
Sodium: 142 mmol/L (ref 135–145)
TOTAL PROTEIN: 7.2 g/dL (ref 6.5–8.1)

## 2018-06-18 LAB — CBC WITH DIFFERENTIAL (CANCER CENTER ONLY)
Abs Immature Granulocytes: 0.03 10*3/uL (ref 0.00–0.07)
BASOS ABS: 0 10*3/uL (ref 0.0–0.1)
BASOS PCT: 0 %
Eosinophils Absolute: 0.1 10*3/uL (ref 0.0–0.5)
Eosinophils Relative: 1 %
HCT: 46.2 % (ref 39.0–52.0)
HEMOGLOBIN: 15.4 g/dL (ref 13.0–17.0)
Immature Granulocytes: 0 %
LYMPHS PCT: 21 %
Lymphs Abs: 1.9 10*3/uL (ref 0.7–4.0)
MCH: 30.9 pg (ref 26.0–34.0)
MCHC: 33.3 g/dL (ref 30.0–36.0)
MCV: 92.8 fL (ref 80.0–100.0)
Monocytes Absolute: 0.9 10*3/uL (ref 0.1–1.0)
Monocytes Relative: 10 %
NEUTROS ABS: 6.1 10*3/uL (ref 1.7–7.7)
NRBC: 0 % (ref 0.0–0.2)
Neutrophils Relative %: 68 %
Platelet Count: 430 10*3/uL — ABNORMAL HIGH (ref 150–400)
RBC: 4.98 MIL/uL (ref 4.22–5.81)
RDW: 12.6 % (ref 11.5–15.5)
WBC: 9.1 10*3/uL (ref 4.0–10.5)

## 2018-06-18 LAB — TSH: TSH: 1.414 u[IU]/mL (ref 0.320–4.118)

## 2018-06-18 MED ORDER — PROCHLORPERAZINE MALEATE 10 MG PO TABS: 10.0000 mg | ORAL_TABLET | Freq: Four times a day (QID) | ORAL | 1 refills | Status: DC | PRN

## 2018-06-18 MED ORDER — DEXAMETHASONE 4 MG PO TABS: ORAL_TABLET | ORAL | 1 refills | Status: DC

## 2018-06-18 MED ORDER — LIDOCAINE-PRILOCAINE 2.5-2.5 % EX CREA: TOPICAL_CREAM | CUTANEOUS | 3 refills | Status: DC

## 2018-06-18 MED ORDER — LORAZEPAM 0.5 MG PO TABS: 0.5000 mg | ORAL_TABLET | Freq: Four times a day (QID) | ORAL | 0 refills | Status: DC | PRN

## 2018-06-18 MED ORDER — ONDANSETRON HCL 8 MG PO TABS: 8.0000 mg | ORAL_TABLET | Freq: Two times a day (BID) | ORAL | 1 refills | Status: DC | PRN

## 2018-06-18 MED ORDER — MORPHINE SULFATE (CONCENTRATE) 10 MG /0.5 ML PO SOLN: 10.0000 mg | ORAL | 0 refills | Status: AC | PRN

## 2018-06-18 NOTE — Telephone Encounter (Signed)
Oncology Nurse Navigator Documentation  Spoke with pt, informed him CT SIM rescheduled for 2/5 0800, I encouraged lobby registration and arrival to Radiation Waiting by 0745.  He voiced understanding.  [name], RN, BSN Head & Neck Oncology Nurse [place] at [place] [phone]

## 2018-06-18 NOTE — Telephone Encounter (Signed)
Scheduled appt per 01/29 los. ° °Printed calendar and avs. °

## 2018-06-18 NOTE — Progress Notes (Signed)
START ON PATHWAY REGIMEN - Head and Neck     A cycle is every 21 days:     Cisplatin   **Always confirm dose/schedule in your pharmacy ordering system**  Patient Characteristics: Oropharynx, HPV Positive, Pathologically Staged, T0-4, pN1-2 or T4, pN0 Disease Classification: Oropharynx HPV Status: Positive (+) AJCC N Category: pN1 AJCC 8 Stage Grouping: I Current Disease Status: No Distant Metastases and No Recurrent Disease AJCC T Category: T1 AJCC M Category: M0 Intent of Therapy: Curative Intent, Discussed with Patient

## 2018-06-19 ENCOUNTER — Telehealth: Payer: Self-pay | Admitting: *Deleted

## 2018-06-19 NOTE — Progress Notes (Signed)
Oncology Nurse Navigator Documentation  Met with Mr. Niblack to provide PEG education.  He was accompanied by his wife. Using  PEG teaching device   and Teach Back, provided education for PEG use and care, including: hand hygiene, gravity bolus administration of daily water flushes and nutritional supplement, fluids and medications; care of tube insertion site including daily dressing change and cleaning; S&S of infection.  Mr. Surratt correctly verbalized dressing change and cleaning procedures, provided correct return demonstration of gravity administration of water and dressing change.  I provided written instructions for PEG flushing/dressing change in support of verbal instruction.  Described contents of Start of Care Bolus Feeding Kit to be provided by [place] day of PEG placement.  He understands I will be available for ongoing PEG support.  [name], RN, BSN Head & Neck Oncology Nurse [place] at [place] [phone]

## 2018-06-19 NOTE — Telephone Encounter (Signed)
Oncology Nurse Navigator Documentation  Sent fax to [facility] requesting baseline audiology, requested call/email when appt scheduled, fax copy of report.  'Successful TX Notice' received.  [name], RN, BSN Head & Neck Oncology Nurse [place] at [place] [phone]

## 2018-06-19 NOTE — Progress Notes (Signed)
Oncology Nurse Navigator Documentation  Met with Glenn Goodman and his wife during Est Pt appt with Dr. Maylon Peppers. They voiced understanding:  Of recommendation to proceed with adjuvant chemotherapy in conjunction with adjuvant RT.  Plan for q21d HD cisplatin.  I will coordinate scheduling of baseline audiology evaluation with [facility].  PEG/PAC placement to be scheduled by [facility]. I escorted them to Scheduling, encouraged them to call me with questions/concerns.  [name], RN, BSN Head & Neck Oncology Nurse [place] at [place] [phone]

## 2018-06-20 ENCOUNTER — Telehealth: Payer: Self-pay | Admitting: *Deleted

## 2018-06-20 NOTE — Telephone Encounter (Signed)
Oncology Nurse Navigator Documentation  Mr. Glenn Goodman called to confirm appts for next week.  He noted has dental cleaning Tuesday morning.  I encouraged 0745 arrival Wednesday, 2/5, for 0800 CT SIM, reviewed registration procedure.  He voiced understanding.  [name], RN, BSN Head & Neck Oncology Nurse [place] at [place] [phone]

## 2018-06-23 ENCOUNTER — Encounter: Payer: Self-pay | Admitting: *Deleted

## 2018-06-23 ENCOUNTER — Other Ambulatory Visit: Payer: Self-pay | Admitting: Hematology

## 2018-06-23 DIAGNOSIS — C099 Malignant neoplasm of tonsil, unspecified: Secondary | ICD-10-CM

## 2018-06-23 DIAGNOSIS — H90A22 Sensorineural hearing loss, unilateral, left ear, with restricted hearing on the contralateral side: Secondary | ICD-10-CM | POA: Diagnosis not present

## 2018-06-23 DIAGNOSIS — H90A31 Mixed conductive and sensorineural hearing loss, unilateral, right ear with restricted hearing on the contralateral side: Secondary | ICD-10-CM | POA: Diagnosis not present

## 2018-06-23 MED ORDER — DEXAMETHASONE 4 MG PO TABS: ORAL_TABLET | ORAL | 1 refills | Status: DC

## 2018-06-23 NOTE — Progress Notes (Signed)
DISCONTINUE ON PATHWAY REGIMEN - Head and Neck     A cycle is every 21 days:     Cisplatin   **Always confirm dose/schedule in your pharmacy ordering system**  REASON: Other Reason PRIOR TREATMENT: [id number]: Cisplatin 100 mg/m2 q21 Days x 3 Cycles with Concurrent Radiation TREATMENT RESPONSE: Unable to Evaluate  START ON PATHWAY REGIMEN - Head and Neck     A cycle is every 7 days:     Cisplatin   **Always confirm dose/schedule in your pharmacy ordering system**  Patient Characteristics: Oropharynx, HPV Positive, Pathologically Staged, T0-4, pN1-2 or T4, pN0 Disease Classification: Oropharynx HPV Status: Positive (+) AJCC N Category: pN1 AJCC 8 Stage Grouping: I Current Disease Status: No Distant Metastases and No Recurrent Disease AJCC T Category: T1 AJCC M Category: M0 Intent of Therapy: Curative Intent, Discussed with Patient

## 2018-06-24 ENCOUNTER — Telehealth: Payer: Self-pay | Admitting: Hematology

## 2018-06-24 ENCOUNTER — Ambulatory Visit: Payer: Commercial Managed Care - PPO

## 2018-06-24 ENCOUNTER — Telehealth: Payer: Self-pay | Admitting: Radiation Oncology

## 2018-06-24 ENCOUNTER — Ambulatory Visit: Payer: Commercial Managed Care - PPO | Admitting: Radiation Oncology

## 2018-06-24 NOTE — Telephone Encounter (Signed)
Patient called today and inquired about the process of prior authorization and coverage for radiation treatment. I advised patient I would be initiating the prior authorization for his radiation treatment. Patient verbally acknowledged. I advised patient to reach out to me with any further questions or concerns.

## 2018-06-24 NOTE — Telephone Encounter (Signed)
Patient came in to reschedule missed appt from 01/30.  Rescheduled patient missed lab and Md visit.

## 2018-06-25 ENCOUNTER — Ambulatory Visit
Admission: RE | Admit: 2018-06-25 | Discharge: 2018-06-25 | Disposition: A | Discharge status: Home / Self Care | Payer: Commercial Managed Care - PPO | Source: Ambulatory Visit | Attending: Radiation Oncology | Admitting: Radiation Oncology

## 2018-06-25 ENCOUNTER — Ambulatory Visit: Payer: Commercial Managed Care - PPO

## 2018-06-25 ENCOUNTER — Telehealth: Payer: Self-pay | Admitting: *Deleted

## 2018-06-25 DIAGNOSIS — C601 Malignant neoplasm of glans penis: Secondary | ICD-10-CM | POA: Diagnosis not present

## 2018-06-25 DIAGNOSIS — Z79899 Other long term (current) drug therapy: Secondary | ICD-10-CM | POA: Diagnosis not present

## 2018-06-25 DIAGNOSIS — Z510 Encounter for antineoplastic radiation therapy: Secondary | ICD-10-CM

## 2018-06-25 DIAGNOSIS — C099 Malignant neoplasm of tonsil, unspecified: Secondary | ICD-10-CM | POA: Diagnosis not present

## 2018-06-25 DIAGNOSIS — H903 Sensorineural hearing loss, bilateral: Secondary | ICD-10-CM | POA: Diagnosis not present

## 2018-06-25 DIAGNOSIS — Z5111 Encounter for antineoplastic chemotherapy: Secondary | ICD-10-CM | POA: Diagnosis present

## 2018-06-25 DIAGNOSIS — C01 Malignant neoplasm of base of tongue: Secondary | ICD-10-CM | POA: Insufficient documentation

## 2018-06-25 NOTE — Telephone Encounter (Signed)
Oncology Nurse Navigator Documentation  Mr. Escandon called s/p this morning's CT SIM.    I addressed question re change in chemotherapy schedule, he voiced understanding regime changed to weekly LD cisplatin vs previously scheduled q21d HD cisplatin d/t hearing loss identified by recent audiology evaluation.   I reviewed instructions for barium sulfate contrast he rec'd this morning when he arrived for CT SIM.  PEG scheduled for Monday 2/10, [facility]; he voiced understanding bolus feeding starter kit will be delivered to [facility] same day.  Addressed questions re evolution of SEs as tmts progress. I encouraged him to call me with additional questions/concerns.  [name], RN, BSN Head & Neck Oncology Nurse [place] at [place] [phone]

## 2018-06-25 NOTE — Progress Notes (Signed)
Head and Neck Cancer Simulation, IMRT treatment planning, and Special treatment procedure note   Outpatient  Diagnosis:    ICD-10-CM   1. 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 An Aquaplast head and shoulder mask was custom fitted to the patient's anatomy. High-resolution CT axial imaging was obtained of the head and neck. I verified that the quality of the imaging is good for treatment planning. 1 Medically Necessary Treatment Device was fabricated and supervised by me: Aquaplast mask.   Treatment planning note I plan to treat the patient with IMRT. I plan to treat the patient's tumor and bilateral neck nodes. I plan to treat to a total dose of 66 Gray in 33  fractions. Dose calculation was ordered from dosimetry.  IMRT planning Note  IMRT is medically necessary and an important modality to deliver adequate dose to the patient's at risk tissues while sparing the patient's normal structures, including the: esophagus, parotid tissue, mandible, brain stem, spinal cord, oral cavity, brachial plexus.  This justifies the use of IMRT in the patient's treatment.   Special Treatment Procedure Note:  The patient will be receiving chemotherapy concurrently. Chemotherapy heightens the risk of side effects. I have considered this during the patient's treatment planning process and will monitor the patient accordingly for side effects on a weekly basis. Concurrent chemotherapy increases the complexity of this patient's treatment and therefore this constitutes a special treatment procedure.  -----------------------------------  Eppie Gibson, MD

## 2018-06-25 NOTE — Telephone Encounter (Signed)
Oncology Nurse Navigator Documentation  Spoke with Edwinna Areola, [facility], requested delivery of Start of Care Bolus Feeding Kit to [facility] in support of patient's 2/10 10:00 PEG placement.  She voiced understanding.  [name], RN, BSN Head & Neck Oncology Nurse [place] at [place] [phone]

## 2018-06-27 ENCOUNTER — Other Ambulatory Visit: Payer: Self-pay | Admitting: Radiology

## 2018-06-30 ENCOUNTER — Ambulatory Visit (HOSPITAL_COMMUNITY)
Admission: RE | Admit: 2018-06-30 | Discharge: 2018-06-30 | Disposition: A | Discharge status: Home / Self Care | Payer: Commercial Managed Care - PPO | Source: Ambulatory Visit | Attending: Hematology | Admitting: Hematology

## 2018-06-30 ENCOUNTER — Encounter (HOSPITAL_COMMUNITY): Payer: Self-pay | Admitting: Interventional Radiology

## 2018-06-30 ENCOUNTER — Inpatient Hospital Stay: Payer: Commercial Managed Care - PPO | Admitting: Nutrition

## 2018-06-30 DIAGNOSIS — Z79899 Other long term (current) drug therapy: Secondary | ICD-10-CM | POA: Insufficient documentation

## 2018-06-30 DIAGNOSIS — Z452 Encounter for adjustment and management of vascular access device: Secondary | ICD-10-CM | POA: Insufficient documentation

## 2018-06-30 DIAGNOSIS — M1990 Unspecified osteoarthritis, unspecified site: Secondary | ICD-10-CM | POA: Diagnosis not present

## 2018-06-30 DIAGNOSIS — C099 Malignant neoplasm of tonsil, unspecified: Secondary | ICD-10-CM | POA: Diagnosis not present

## 2018-06-30 DIAGNOSIS — G2581 Restless legs syndrome: Secondary | ICD-10-CM | POA: Insufficient documentation

## 2018-06-30 DIAGNOSIS — G4733 Obstructive sleep apnea (adult) (pediatric): Secondary | ICD-10-CM | POA: Diagnosis not present

## 2018-06-30 DIAGNOSIS — Z5111 Encounter for antineoplastic chemotherapy: Secondary | ICD-10-CM | POA: Diagnosis not present

## 2018-06-30 DIAGNOSIS — C760 Malignant neoplasm of head, face and neck: Secondary | ICD-10-CM | POA: Diagnosis not present

## 2018-06-30 DIAGNOSIS — Z4659 Encounter for fitting and adjustment of other gastrointestinal appliance and device: Secondary | ICD-10-CM | POA: Diagnosis not present

## 2018-06-30 HISTORY — PX: IR IMAGING GUIDED PORT INSERTION: IMG5740

## 2018-06-30 HISTORY — PX: IR GASTROSTOMY TUBE MOD SED: IMG625

## 2018-06-30 LAB — CBC WITH DIFFERENTIAL/PLATELET
Abs Immature Granulocytes: 0.05 10*3/uL (ref 0.00–0.07)
Basophils Absolute: 0 10*3/uL (ref 0.0–0.1)
Basophils Relative: 0 %
Eosinophils Absolute: 0.1 10*3/uL (ref 0.0–0.5)
Eosinophils Relative: 1 %
HCT: 47.3 % (ref 39.0–52.0)
Hemoglobin: 15.2 g/dL (ref 13.0–17.0)
Immature Granulocytes: 1 %
Lymphocytes Relative: 23 %
Lymphs Abs: 2 10*3/uL (ref 0.7–4.0)
MCH: 30.3 pg (ref 26.0–34.0)
MCHC: 32.1 g/dL (ref 30.0–36.0)
MCV: 94.2 fL (ref 80.0–100.0)
Monocytes Absolute: 0.9 10*3/uL (ref 0.1–1.0)
Monocytes Relative: 9 %
Neutro Abs: 5.9 10*3/uL (ref 1.7–7.7)
Neutrophils Relative %: 66 %
Platelets: 297 10*3/uL (ref 150–400)
RBC: 5.02 MIL/uL (ref 4.22–5.81)
RDW: 13.1 % (ref 11.5–15.5)
WBC: 9 10*3/uL (ref 4.0–10.5)
nRBC: 0 % (ref 0.0–0.2)

## 2018-06-30 LAB — PROTIME-INR
INR: 0.94
Prothrombin Time: 12.5 seconds (ref 11.4–15.2)

## 2018-06-30 MED ORDER — CEFAZOLIN SODIUM-DEXTROSE 2-4 GM/100ML-% IV SOLN
INTRAVENOUS | Status: AC
  Administered 2018-06-30: 2 g via INTRAVENOUS
  Filled 2018-06-30: qty 100

## 2018-06-30 MED ORDER — ONDANSETRON HCL 4 MG/2ML IJ SOLN: 4.0000 mg | INTRAMUSCULAR | Status: DC | PRN

## 2018-06-30 MED ORDER — IOPAMIDOL (ISOVUE-300) INJECTION 61%
INTRAVENOUS | Status: AC
  Filled 2018-06-30: qty 50

## 2018-06-30 MED ORDER — LIDOCAINE-EPINEPHRINE (PF) 1 %-1:200000 IJ SOLN
INTRAMUSCULAR | Status: AC | PRN
  Administered 2018-06-30: 5 mL

## 2018-06-30 MED ORDER — HEPARIN SOD (PORK) LOCK FLUSH 100 UNIT/ML IV SOLN
INTRAVENOUS | Status: AC
  Filled 2018-06-30: qty 5

## 2018-06-30 MED ORDER — FENTANYL CITRATE (PF) 100 MCG/2ML IJ SOLN
INTRAMUSCULAR | Status: AC
  Filled 2018-06-30: qty 2

## 2018-06-30 MED ORDER — GLUCAGON HCL (RDNA) 1 MG IJ SOLR
INTRAMUSCULAR | Status: AC | PRN
  Administered 2018-06-30: 1 mg via INTRAVENOUS

## 2018-06-30 MED ORDER — FENTANYL CITRATE (PF) 100 MCG/2ML IJ SOLN
INTRAMUSCULAR | Status: AC | PRN
  Administered 2018-06-30 (×2): 50 ug via INTRAVENOUS

## 2018-06-30 MED ORDER — SODIUM CHLORIDE 0.9 % IV SOLN
INTRAVENOUS | Status: DC
  Administered 2018-06-30: 10:00:00 via INTRAVENOUS

## 2018-06-30 MED ORDER — GLUCAGON HCL RDNA (DIAGNOSTIC) 1 MG IJ SOLR
INTRAMUSCULAR | Status: AC
  Filled 2018-06-30: qty 1

## 2018-06-30 MED ORDER — HYDROCODONE-ACETAMINOPHEN 5-325 MG PO TABS
1.0000 | ORAL_TABLET | ORAL | Status: DC | PRN
  Administered 2018-06-30: 1 via ORAL
  Filled 2018-06-30: qty 1

## 2018-06-30 MED ORDER — MIDAZOLAM HCL 2 MG/2ML IJ SOLN
INTRAMUSCULAR | Status: AC
  Filled 2018-06-30: qty 4

## 2018-06-30 MED ORDER — HEPARIN SOD (PORK) LOCK FLUSH 100 UNIT/ML IV SOLN
INTRAVENOUS | Status: AC | PRN
  Administered 2018-06-30: 500 [IU] via INTRAVENOUS

## 2018-06-30 MED ORDER — LIDOCAINE-EPINEPHRINE (PF) 1 %-1:200000 IJ SOLN
INTRAMUSCULAR | Status: AC | PRN
  Administered 2018-06-30: 10 mL

## 2018-06-30 MED ORDER — LIDOCAINE-EPINEPHRINE (PF) 2 %-1:200000 IJ SOLN
INTRAMUSCULAR | Status: AC
  Filled 2018-06-30: qty 20

## 2018-06-30 MED ORDER — IOPAMIDOL (ISOVUE-300) INJECTION 61%
50.0000 mL | Freq: Once | INTRAVENOUS | Status: AC | PRN
  Administered 2018-06-30: 10 mL

## 2018-06-30 MED ORDER — HYDROMORPHONE HCL 1 MG/ML IJ SOLN: 1.0000 mg | INTRAMUSCULAR | Status: DC | PRN

## 2018-06-30 MED ORDER — CEFAZOLIN SODIUM-DEXTROSE 2-4 GM/100ML-% IV SOLN
2.0000 g | INTRAVENOUS | Status: AC
  Administered 2018-06-30: 2 g via INTRAVENOUS

## 2018-06-30 MED ORDER — MIDAZOLAM HCL 2 MG/2ML IJ SOLN
INTRAMUSCULAR | Status: AC | PRN
  Administered 2018-06-30 (×4): 1 mg via INTRAVENOUS

## 2018-06-30 NOTE — Discharge Instructions (Signed)
Gastrostomy Tube Home Guide, Adult °A gastrostomy tube, or G-tube, is a tube that is inserted through the abdomen into the stomach. The tube is used to give feedings and medicines when a person is unable to eat and drink enough on his or her own. °How to care for a G-tube °Supplies needed °· Saline solution or clean, warm water and soap. °· Cotton swab or gauze. °· Precut gauze bandage (dressing) and tape, if needed. °Instructions °1. Wash your hands with soap and water. °2. If there is a dressing between the person's skin and the tube, remove it. °3. Check the area where the tube enters the skin. Check for problems such as: °? Redness. °? Swelling. °? Pus-like drainage. °? Extra skin growth. °4. Moisten the cotton swab with the saline solution or soap and water mixture. Gently clean around the insertion site. Remove any drainage or crusting. °? When the G-tube is first put in, a normal saline solution or water can be used to clean the skin. °? Mild soap and warm water can be used when the skin around the G-tube site has healed. °5. If there should be a dressing between the person's skin and the tube, apply it at this time. °How to flush a G-tube °Flush the G-tube regularly to keep it from clogging. Flush it before and after feedings and as often as told by the health care provider. °Supplies needed °· Purified or sterile water, warmed. If the person has a weak disease-fighting (immune) system, or if he or she has difficulty fighting off infections (is immunocompromised), use only sterile water. °? If you are unsure about the amount of chemical contaminants in purified or drinking water, use sterile water. °? To purify drinking water by boiling: °§ Boil water for at least 1 minute. Keep lid over water while it boils. Allow water to cool to room temperature before using. °· 60cc G-tube syringe. °Instructions °1. Wash your hands with soap and water. °2. Draw up 30 mL of warm water in a syringe. °3. Connect the syringe  to the tube. °4. Slowly and gently push the water into the tube. °G-tube problems and solutions °· If the tube comes out: °? Cover the opening with a clean dressing and tape. °? Call a health care provider right away. °? A health care provider will need to put the tube back in within 4 hours. °· If there is skin or scar tissue growing where the tube enters the skin: °? Keep the area clean and dry. °? Secure the tube with tape so that the tube does not move around too much. °? Call a health care provider. °· If the tube gets clogged: °? Slowly push warm water into the tube with a large syringe. °? Do not force the fluid into the tube or push an object into the tube. °? If you are not able to unclog the tube, call a health care provider right away. °Follow these instructions at home: °Feedings °· Give feedings at room temperature. °· Cover and place unused feedings in the refrigerator. °· If feedings are continuous: °? Do not put more than 4 hours worth of feedings in the feeding bag. °? Stop the feedings when you need to give medicine or flush the tube. Be sure to restart the feedings. °? Make sure the person's head is above his or her stomach (upright position). This will prevent choking and discomfort. °· Replace feeding bags and syringes as told by the health care provider. °· Make   sure the person is in the right position during and after feedings: °? During feedings, the person's position should be in the upright position. °? After a noncontinuous feeding (bolus feeding), have the person stay in the upright position for 1 hour. °General instructions °· Only use syringes made for G-tubes. °· Do not pull or put tension on the tube. °· Clamp the tube before removing the cap or disconnecting a syringe. °· Measure the length of the G-tube every day from the insertion site to the end of the tube. °· If the person's G-tube has a balloon, check the fluid in the balloon every week. The amount of fluid that should be in  the balloon can be found in the manufacturer’s specifications. °· Make sure the person takes care of his or her oral health, such as by brushing his or her teeth. °· Remove excess air from the G-tube as told by the person's health care provider. This is called "venting." °· Keep the area where the tube enters the skin clean and dry. °· Do not push feedings, medicines, or flushes rapidly. °Contact a health care provider if: °· The person with the tube has any of these problems: °? Constipation. °? Fever. °· There is a large amount of fluid or mucus-like liquid leaking from the tube. °· Skin or scar tissue appears to be growing where the tube enters the skin. °· The length of tube from the insertion site to the G-tube gets longer. °Get help right away if: °· The person with the tube has any of these problems: °? Severe abdominal pain. °? Severe tenderness. °? Severe bloating. °? Nausea. °? Vomiting. °? Trouble breathing. °? Shortness of breath. °· Any of these problems happen in the area where the tube enters the skin: °? Redness, irritation, swelling, or soreness. °? Pus-like discharge. °? A bad smell. °· The tube is clogged and cannot be flushed. °· The tube comes out. °Summary °· A gastrostomy tube, or G-tube, is a tube that is inserted through the abdomen into the stomach. The tube is used to give feedings and medicines when a person is unable to eat and drink enough on his or her own. °· Check and clean the insertion site daily as told by the person's health care provider. °· Flush the G-tube regularly to keep it from clogging. Flush it before and after feedings and as often as told by the person's health care provider. °· Keep the area where the tube enters the skin clean and dry. °This information is not intended to replace advice given to you by your health care provider. Make sure you discuss any questions you have with your health care provider. °Document Released: 07/16/2001 Document Revised: 11/20/2016  Document Reviewed: 07/02/2016 °Elsevier Interactive Patient Education © 2019 Elsevier Inc. ° ° ° °Moderate Conscious Sedation, Adult, Care After °These instructions provide you with information about caring for yourself after your procedure. Your health care provider may also give you more specific instructions. Your treatment has been planned according to current medical practices, but problems sometimes occur. Call your health care provider if you have any problems or questions after your procedure. °What can I expect after the procedure? °After your procedure, it is common: °· To feel sleepy for several hours. °· To feel clumsy and have poor balance for several hours. °· To have poor judgment for several hours. °· To vomit if you eat too soon. °Follow these instructions at home: °For at least 24 hours after the procedure: ° °·   Do not: ? Participate in activities where you could fall or become injured. ? Drive. ? Use heavy machinery. ? Drink alcohol. ? Take sleeping pills or medicines that cause drowsiness. ? Make important decisions or sign legal documents. ? Take care of children on your own.  Rest. Eating and drinking  Follow the diet recommended by your health care provider.  If you vomit: ? Drink water, juice, or soup when you can drink without vomiting. ? Make sure you have little or no nausea before eating solid foods. General instructions  Have a responsible adult stay with you until you are awake and alert.  Take over-the-counter and prescription medicines only as told by your health care provider.  If you smoke, do not smoke without supervision.  Keep all follow-up visits as told by your health care provider. This is important. Contact a health care provider if:  You keep feeling nauseous or you keep vomiting.  You feel light-headed.  You develop a rash.  You have a fever. Get help right away if:  You have trouble breathing. This information is not intended to  replace advice given to you by your health care provider. Make sure you discuss any questions you have with your health care provider. Document Released: 02/25/2013 Document Revised: 10/10/2015 Document Reviewed: 08/27/2015 Elsevier Interactive Patient Education  2019 [place] Insertion, Care After This sheet gives you information about how to care for yourself after your procedure. Your health care provider may also give you more specific instructions. If you have problems or questions, contact your health care provider. What can I expect after the procedure? After the procedure, it is common to have:  Discomfort at the port insertion site.  Bruising on the skin over the port. This should improve over 3-4 days. Follow these instructions at home: [facility] care  After your port is placed, you will get a manufacturer's information card. The card has information about your port. Keep this card with you at all times.  Take care of the port as told by your health care provider. Ask your health care provider if you or a family member can get training for taking care of the port at home. A home health care nurse may also take care of the port.  Make sure to remember what type of port you have. Incision care      Follow instructions from your health care provider about how to take care of your port insertion site. Make sure you: ? Wash your hands with soap and water before and after you change your bandage (dressing). If soap and water are not available, use hand sanitizer. ? Change your dressing as told by your health care provider. ? Leave stitches (sutures), skin glue, or adhesive strips in place. These skin closures may need to stay in place for 2 weeks or longer. If adhesive strip edges start to loosen and curl up, you may trim the loose edges. Do not remove adhesive strips completely unless your health care provider tells you to do that.  Check your port insertion site  every day for signs of infection. Check for: ? Redness, swelling, or pain. ? Fluid or blood. ? Warmth. ? Pus or a bad smell. Activity  Return to your normal activities as told by your health care provider. Ask your health care provider what activities are safe for you.  Do not lift anything that is heavier than 10 lb (4.5 kg), or the limit that you are  told, until your health care provider says that it is safe. General instructions  Take over-the-counter and prescription medicines only as told by your health care provider.  Do not take baths, swim, or use a hot tub until your health care provider approves. Ask your health care provider if you may take showers. You may only be allowed to take sponge baths.  Do not drive for 24 hours if you were given a sedative during your procedure.  Wear a medical alert bracelet in case of an emergency. This will tell any health care providers that you have a port.  Keep all follow-up visits as told by your health care provider. This is important. Contact a health care provider if:  You cannot flush your port with saline as directed, or you cannot draw blood from the port.  You have a fever or chills.  You have redness, swelling, or pain around your port insertion site.  You have fluid or blood coming from your port insertion site.  Your port insertion site feels warm to the touch.  You have pus or a bad smell coming from the port insertion site. Get help right away if:  You have chest pain or shortness of breath.  You have bleeding from your port that you cannot control. Summary  Take care of the port as told by your health care provider. Keep the manufacturer's information card with you at all times.  Change your dressing as told by your health care provider.  Contact a health care provider if you have a fever or chills or if you have redness, swelling, or pain around your port insertion site.  Keep all follow-up visits as told by  your health care provider. This information is not intended to replace advice given to you by your health care provider. Make sure you discuss any questions you have with your health care provider. Document Released: 02/25/2013 Document Revised: 12/03/2017 Document Reviewed: 12/03/2017 Elsevier Interactive Patient Education  2019 [place] Liquid Diet, Adult A clear liquid diet is a diet that includes only liquids and semi-liquids that you can see through. You do not eat any food on this diet. Most people need to follow this diet for only a short time. You may need to follow a clear liquid diet if:  You have a problem right before or after you have surgery.  You did not eat food for a long time.  You had any of these: ? Feeling sick to your stomach (nausea). ? Throwing up (vomiting). ? Passing a watery stool (diarrhea).  You are going to have an exam to look at parts of your digestive system.  You are going to have bowel surgery. The goals of this diet are:  To rest the stomach.  To help you clear the digestive system before an exam.  To make sure that there is enough fluid in your body.  To make sure you get some energy.  To help you get back to eating like you used to. What are tips for following this plan?  A clear liquid is a liquid or semi-liquid that you can see through when you hold it up to a light. An example of this is gelatin.  This diet does not give you all the nutrients that you need. Choose a variety of the liquids that your doctor says you can drink on this diet. That way, you will get as many nutrients as possible.  If you are not sure whether  you can have certain items, ask your doctor. If you are unable to swallow a thin liquid, you will need to thicken it before taking it. This will stop you from breathing it in (aspiration). What foods should I eat?   Water and flavored water.  Fruit juices that do not have pulp, such as cranberry juice  and apple juice.  Tea and coffee without milk or cream.  Clear bouillon or broth.  Broth-based soups that have been strained.  Flavored gelatins.  Honey.  Sugar water.  Ice or frozen ice pops that do not have any milk, yogurt, fruit pieces, or fruit pulp in them.  Clear sodas.  Clear sports drinks. The items listed above may not be a complete list of what you can eat and drink. Contact a dietitian for more options. What foods should I avoid?  Juices that have pulp.  Milk.  Cream or cream-based soups.  Yogurt.  Normal foods that are not clear liquids or semi-liquids. The items listed above may not be a complete list of what you should not eat and drink. Contact a dietitian for options. Questions to ask your health care provider  How long do I need to follow this diet?  Are there any medicines that I should change while on this diet? Summary  A clear liquid diet is a diet that includes only liquids and semi-liquids that you can see through.  Some goals of this diet are to rest your stomach, make sure you get enough fluid, and give you some energy.  Avoid liquids with milk, cream, or pulp while you are on this diet. This information is not intended to replace advice given to you by your health care provider. Make sure you discuss any questions you have with your health care provider. Document Released: 04/19/2008 Document Revised: 10/28/2017 Document Reviewed: 10/28/2017 Elsevier Interactive Patient Education  2019 [employer].

## 2018-06-30 NOTE — H&P (Signed)
Referring Physician(s): Zhao,Yan  Supervising Physician: Sandi Mariscal  Patient Status:  [facility]  Chief Complaint: "I'm getting a port and stomach tube put in"   Subjective: Patient familiar to IR service from prior left cervical lymph node biopsy on 04/07/2018.  He has a history of squamous cell carcinoma of the left tonsil and presents again today for both Port-A-Cath and gastrostomy tube placements prior to chemoradiation.  He currently denies fever, dysphagia, headache, chest pain, dyspnea, cough, abdominal pain, back pain, nausea, vomiting or bleeding.  He does have some mild left neck soreness from recent surgery.  Past Medical History:  Diagnosis Date  . Arthritis   . OSA on CPAP   . Restless leg syndrome    Past Surgical History:  Procedure Laterality Date  . DIRECT LARYNGOSCOPY Left 04/22/2018   Dr. Janace Hoard  . FACIAL FRACTURE SURGERY Left 1986  . MODIFIED RADICAL NECK DISSECTION Left 05/26/2018   Dr. Nicolette Bang, [facility]  . transoral robotic surgery  05/26/2018   [facility], Dr. Nicolette Bang [facility]      Allergies: Patient has no known allergies.  Medications: Prior to Admission medications   Medication Sig Start Date End Date Taking? Authorizing Provider  Cholecalciferol (VITAMIN D3) 50 MCG (2000 UT) TABS Take 2,000 Units by mouth daily.    [provider]  dexamethasone (DECADRON) 4 MG tablet Take 2 tablets by mouth once a day on the day after chemotherapy and then take 2 tablets two times a day for 2 days. Take with food. 06/23/18   Tish Men, MD  famotidine (PEPCID) 20 MG tablet Take 20 mg by mouth daily as needed for heartburn or indigestion.    [provider]  GLUCOSAMINE-CHONDROITIN PO Take 1 tablet by mouth daily.    [provider]  lidocaine-prilocaine (EMLA) cream Apply to affected area once 06/18/18   Tish Men, MD  LORazepam (ATIVAN) 0.5 MG tablet Take 1 tablet (0.5 mg total) by mouth every 6 (six) hours as needed (Nausea or vomiting). 06/18/18    Tish Men, MD  Magnesium 500 MG TABS Take 500 mg by mouth daily.    [provider]  Morphine Sulfate (MORPHINE CONCENTRATE) 10 mg / 0.5 ml concentrated solution Take 0.5 mLs (10 mg total) by mouth every 4 (four) hours as needed for up to 30 days for severe pain. 06/18/18 07/18/18  Tish Men, MD  ondansetron (ZOFRAN) 8 MG tablet Take 1 tablet (8 mg total) by mouth 2 (two) times daily as needed. Start on the third day after chemotherapy. 06/18/18   Tish Men, MD  oxyCODONE (OXY IR/ROXICODONE) 5 MG immediate release tablet PLEASE SEE ATTACHED FOR DETAILED DIRECTIONS 06/04/18   [provider]  pramipexole (MIRAPEX) 0.5 MG tablet  06/07/18   [provider]  prochlorperazine (COMPAZINE) 10 MG tablet Take 1 tablet (10 mg total) by mouth every 6 (six) hours as needed (Nausea or vomiting). 06/18/18   Tish Men, MD  sodium fluoride (PREVIDENT 5000 PLUS) 1.1 % CREA dental cream Apply to tooth brush. Brush teeth for 2 minutes. Spit out excess. DO NOT rinse afterwards. Repeat nightly. 04/30/18   Lenn Cal, DDS     Vital Signs: Blood pressure 129/94, temperature 98.1, heart rate 83, respirations 18, O2 sat 97% room air   Physical Exam awake, alert.  Chest clear to auscultation bilaterally.  Heart with regular rate and rhythm.  Abdomen soft, positive bowel sounds, nontender.  No lower extremity edema.  Left neck surgical wound site mildly tender  to palpation.  Imaging: No results found.  Labs:  CBC: Recent Labs    04/07/18 0653 06/18/18 1105 06/30/18 1006  WBC 8.9 9.1 9.0  HGB 15.4 15.4 15.2  HCT 47.0 46.2 47.3  PLT 306 430* 297    COAGS: Recent Labs    04/07/18 0653 06/30/18 1006  INR 1.00 0.94    BMP: Recent Labs    06/18/18 1105  NA 142  K 4.0  CL 104  CO2 28  GLUCOSE 110*  BUN 20  CALCIUM 9.6  CREATININE 0.92  GFRNONAA >60  GFRAA >60    LIVER FUNCTION TESTS: Recent Labs    06/18/18 1105  BILITOT 0.4  AST 17  ALT 38  ALKPHOS 77    PROT 7.2  ALBUMIN 3.8    Assessment and Plan: Pt with history of squamous cell carcinoma of the left tonsil ; presents today for both Port-A-Cath and gastrostomy tube placements prior to chemoradiation.  Details/risks of procedures, including but not limited to, internal bleeding, infection, injury to adjacent structures discussed with patient and spouse with their understanding and consent.   Electronically Signed: D. Rowe Robert, PA-C 06/30/2018, 11:39 AM   I spent a total of 30 minutes at the the patient's bedside AND on the patient's hospital floor or unit, greater than 50% of which was counseling/coordinating care for Port-A-Cath and gastrostomy tube placements

## 2018-06-30 NOTE — Sedation Documentation (Signed)
End of port a cath procedure

## 2018-06-30 NOTE — Procedures (Signed)
Pre Procedure Dx: Poor venous access; Impending head and neck XRT Post Procedural Dx: Same  Successful placement of right IJ approach port-a-cath with tip at the superior caval atrial junction. The catheter is ready for immediate use.  Successful fluoroscopic guided insertion of gastrostomy tube.   The gastrostomy tube may be used immediately for medications.   Tube feeds may be initiated in 24 hours as per the primary team.    EBL: Minimal  Complications: None immediate  Ronny Bacon, MD Pager #: [phone]

## 2018-07-02 DIAGNOSIS — Z5111 Encounter for antineoplastic chemotherapy: Secondary | ICD-10-CM | POA: Diagnosis not present

## 2018-07-02 DIAGNOSIS — C099 Malignant neoplasm of tonsil, unspecified: Secondary | ICD-10-CM | POA: Diagnosis not present

## 2018-07-03 DIAGNOSIS — Z125 Encounter for screening for malignant neoplasm of prostate: Secondary | ICD-10-CM | POA: Diagnosis not present

## 2018-07-03 DIAGNOSIS — G2581 Restless legs syndrome: Secondary | ICD-10-CM | POA: Diagnosis not present

## 2018-07-03 DIAGNOSIS — Z0000 Encounter for general adult medical examination without abnormal findings: Secondary | ICD-10-CM | POA: Diagnosis not present

## 2018-07-07 ENCOUNTER — Ambulatory Visit: Payer: Commercial Managed Care - PPO

## 2018-07-07 ENCOUNTER — Ambulatory Visit
Admission: RE | Admit: 2018-07-07 | Discharge: 2018-07-07 | Disposition: A | Discharge status: Home / Self Care | Payer: Commercial Managed Care - PPO | Source: Ambulatory Visit | Attending: Radiation Oncology | Admitting: Radiation Oncology

## 2018-07-07 DIAGNOSIS — C099 Malignant neoplasm of tonsil, unspecified: Secondary | ICD-10-CM | POA: Diagnosis not present

## 2018-07-07 DIAGNOSIS — Z5111 Encounter for antineoplastic chemotherapy: Secondary | ICD-10-CM | POA: Diagnosis not present

## 2018-07-07 MED ORDER — SONAFINE EX EMUL
1.0000 "application " | Freq: Once | CUTANEOUS | Status: AC
  Administered 2018-07-07: 1 via TOPICAL

## 2018-07-07 NOTE — Progress Notes (Signed)

## 2018-07-08 ENCOUNTER — Ambulatory Visit
Admission: RE | Admit: 2018-07-08 | Discharge: 2018-07-08 | Disposition: A | Discharge status: Home / Self Care | Payer: Commercial Managed Care - PPO | Source: Ambulatory Visit | Attending: Radiation Oncology | Admitting: Radiation Oncology

## 2018-07-08 DIAGNOSIS — Z5111 Encounter for antineoplastic chemotherapy: Secondary | ICD-10-CM | POA: Diagnosis not present

## 2018-07-08 DIAGNOSIS — C099 Malignant neoplasm of tonsil, unspecified: Secondary | ICD-10-CM | POA: Diagnosis not present

## 2018-07-09 ENCOUNTER — Other Ambulatory Visit: Payer: Self-pay

## 2018-07-09 ENCOUNTER — Encounter: Payer: Self-pay | Admitting: Hematology

## 2018-07-09 ENCOUNTER — Ambulatory Visit
Admission: RE | Admit: 2018-07-09 | Discharge: 2018-07-09 | Disposition: A | Discharge status: Home / Self Care | Payer: Commercial Managed Care - PPO | Source: Ambulatory Visit | Attending: Radiation Oncology | Admitting: Radiation Oncology

## 2018-07-09 ENCOUNTER — Inpatient Hospital Stay: Payer: Commercial Managed Care - PPO

## 2018-07-09 ENCOUNTER — Inpatient Hospital Stay: Payer: Commercial Managed Care - PPO | Attending: Hematology

## 2018-07-09 ENCOUNTER — Telehealth: Payer: Self-pay | Admitting: Hematology

## 2018-07-09 ENCOUNTER — Inpatient Hospital Stay (HOSPITAL_BASED_OUTPATIENT_CLINIC_OR_DEPARTMENT_OTHER): Payer: Commercial Managed Care - PPO | Admitting: Hematology

## 2018-07-09 VITALS — BP 147/85 | HR 81 | Temp 98.2°F | Resp 17 | Ht 68.0 in | Wt 183.7 lb

## 2018-07-09 DIAGNOSIS — C601 Malignant neoplasm of glans penis: Secondary | ICD-10-CM | POA: Insufficient documentation

## 2018-07-09 DIAGNOSIS — H903 Sensorineural hearing loss, bilateral: Secondary | ICD-10-CM

## 2018-07-09 DIAGNOSIS — Z5111 Encounter for antineoplastic chemotherapy: Secondary | ICD-10-CM | POA: Insufficient documentation

## 2018-07-09 DIAGNOSIS — C099 Malignant neoplasm of tonsil, unspecified: Secondary | ICD-10-CM | POA: Diagnosis not present

## 2018-07-09 DIAGNOSIS — Z95828 Presence of other vascular implants and grafts: Secondary | ICD-10-CM

## 2018-07-09 DIAGNOSIS — Z79899 Other long term (current) drug therapy: Secondary | ICD-10-CM | POA: Insufficient documentation

## 2018-07-09 LAB — CBC WITH DIFFERENTIAL (CANCER CENTER ONLY)
Abs Immature Granulocytes: 0.03 10*3/uL (ref 0.00–0.07)
Basophils Absolute: 0 10*3/uL (ref 0.0–0.1)
Basophils Relative: 1 %
Eosinophils Absolute: 0.1 10*3/uL (ref 0.0–0.5)
Eosinophils Relative: 1 %
HCT: 43.3 % (ref 39.0–52.0)
HEMOGLOBIN: 14.4 g/dL (ref 13.0–17.0)
Immature Granulocytes: 0 %
Lymphocytes Relative: 22 %
Lymphs Abs: 1.8 10*3/uL (ref 0.7–4.0)
MCH: 31.2 pg (ref 26.0–34.0)
MCHC: 33.3 g/dL (ref 30.0–36.0)
MCV: 93.7 fL (ref 80.0–100.0)
Monocytes Absolute: 0.9 10*3/uL (ref 0.1–1.0)
Monocytes Relative: 11 %
Neutro Abs: 5.4 10*3/uL (ref 1.7–7.7)
Neutrophils Relative %: 65 %
Platelet Count: 305 10*3/uL (ref 150–400)
RBC: 4.62 MIL/uL (ref 4.22–5.81)
RDW: 13.2 % (ref 11.5–15.5)
WBC Count: 8.2 10*3/uL (ref 4.0–10.5)
nRBC: 0 % (ref 0.0–0.2)

## 2018-07-09 LAB — BASIC METABOLIC PANEL - CANCER CENTER ONLY
Anion gap: 8 (ref 5–15)
BUN: 15 mg/dL (ref 6–20)
CO2: 29 mmol/L (ref 22–32)
Calcium: 9.1 mg/dL (ref 8.9–10.3)
Chloride: 106 mmol/L (ref 98–111)
Creatinine: 0.83 mg/dL (ref 0.61–1.24)
GFR, Est AFR Am: >60 mL/min (ref >60)
GFR, Estimated: >60 mL/min (ref >60)
Glucose, Bld: 96 mg/dL (ref 70–99)
Potassium: 3.6 mmol/L (ref 3.5–5.1)
Sodium: 143 mmol/L (ref 135–145)

## 2018-07-09 LAB — MAGNESIUM: Magnesium: 2 mg/dL (ref 1.7–2.4)

## 2018-07-09 MED ORDER — SODIUM CHLORIDE 0.9% FLUSH
10.0000 mL | INTRAVENOUS | Status: DC | PRN
  Administered 2018-07-09: 10 mL
  Filled 2018-07-09: qty 10

## 2018-07-09 MED ORDER — HEPARIN SOD (PORK) LOCK FLUSH 100 UNIT/ML IV SOLN
500.0000 [IU] | Freq: Once | INTRAVENOUS | Status: AC | PRN
  Administered 2018-07-09: 500 [IU]
  Filled 2018-07-09: qty 5

## 2018-07-09 NOTE — Telephone Encounter (Signed)
Gave patient avs report and appointments for February thru April. Patient will check with radonc re moving 3/4 xrt closer to f/u with Dr. Maylon Peppers.

## 2018-07-09 NOTE — Progress Notes (Signed)
[facility] OFFICE PROGRESS NOTE  Patient Care Team: Glenn Arabian, MD as PCP - General (Family Medicine) Glenn Montane, MD as Consulting Physician (Otolaryngology) Glenn Gibson, MD as Attending Physician (Radiation Oncology) Glenn Men, MD as Consulting Physician (Hematology) Glenn Sauers, RN as Oncology Nurse Navigator  HEME/ONC OVERVIEW: 1. Stage I (pT1pN1M0) left tonsil SCCa, p16+ -02/2018: CT neck showed enlarged left Level II and II LN with central necrosis (largest 2.4cm); effacement of the left IJ vein noted  -03/2018: US-guided bx of left cervical LN; path showed SCCa, p16+ -04/2018: PET showed FDG-avid left palatine tonsil lesion measuring ~1.3cm with associated FDG-avid left Level IIa LN's; no evidence of metastatic disease -05/2018: TORS by Dr. 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 Port and PEG placement in 06/2018   TREATMENT SUMMARY:  05/26/2018: TORS by Dr. Nicolette Goodman at [facility]   07/07/2018 - present: adjuvant chemoradiation with weekly cisplatin 54m/m2 (due to moderate hearing loss)  ASSESSMENT & PLAN:   Stage I (cT1N1M0) left tonsil SCCa, p16+ -Cycle 1 of weekly cisplatin scheduled on 07/10/2018; labs appropriate today, okay to proceed with treatment  -I reinforced some of the benefits and potential toxicities of treatment, particularly ototoxicity, given the baseline hearing difficulty shown on audiology testing -He will require weekly labs and toxicity monitoring -PRN anti-emetics: Zofran, Compazine, Ativan, dexamethasone -PRN pain medication: IR morphine  Bilateral sensorineural hearing loss -Baseline hearing test showed at least mild to moderate bilateral sensorineural hearing loss -Clinically, patient denies any difficulty with  hearing or tinnitus -I discussed the rationale for choosing cisplatin in the setting of HPV-positive tonsillar SCCa -Given the baseline hearing problem, I recommended weekly cisplatin with the goal of reducing ototoxicities -I counseled the patient on monitoring for any symptoms of hearing change, including sudden hearing loss, deteriorating hearing, or new/worsening tinnitus, for which he should contact the clinic promptly -We will plan to repeat hearing test at the end of the treatment to assess for any treatment-related changes; sooner if needed clinically   All questions were answered. The patient knows to call the clinic with any problems, questions or concerns. No barriers to learning was detected.  A total of more than 25 minutes were spent face-to-face with the patient during this encounter and over half of that time was spent on counseling and coordination of care as outlined above.   Return in 1 week for labs and clinic follow-up.   YTish Men MD 07/09/2018 2:06 PM  CHIEF COMPLAINT: "I am ready to get started"  INTERVAL HISTORY: Mr. Glenn Goodman to clinic for follow-up of left tonsillar SCCa.  Patient reports that the port and PEG tube placement went well without any complications.  He met with the chemotherapy educator this morning, and discussed the chemotherapy side effects and management at length.  He reports normal hearing and denies any hearing difficulty or ringing in the ear.  He denies any other complaint today.  SUMMARY OF ONCOLOGIC HISTORY:   Squamous cell carcinoma of left tonsil ([place]   03/20/2018 Procedure    FNA of the left cervical LN in office    03/20/2018 Pathology Results    Non-diagnostic    04/07/2018 Procedure    US-guided left cervical LN biopsy/FNA     04/07/2018 Pathology Results    (Accession: [id number] Lymph node, needle/core biopsy, left  cervical - METASTATIC SQUAMOUS CELL CARCINOMA INVOLVING A LYMPH NODE (1/1) - SEE COMMENT  p16 IHC  positive. EBV ISH negative.     04/22/2018 Pathology Results    (Accession: [id number]) Tonsil, biopsy, left, base of tongue - INVASIVE KERATINIZING SQUAMOUS CELL CARCINOMA.    04/29/2018 Imaging    PET:  IMPRESSION: 1. Left palatine tonsil/adjacent pharyngeal mucosal space lesion is asymmetric, measures about 1.3 cm in long axis, and has a maximum SUV of 8.2, compatible with malignancy. There is associated hypermetabolic left level IIa adenopathy as shown on prior exams. 2. Symmetric accentuated activity in the lymphoid tissue at the tongue base, maximum SUV 5.3, quite likely physiologic given the symmetry.  3. No findings of metastatic disease Pred to the chest, abdomen/pelvis, or skeleton.    05/26/2018 Surgery    Radical tonsillectomy    05/26/2018 Pathology Results    PROCEDURE: Radical tonsillectomy TUMOR SITE: Glossotonsillar sulcus TUMOR LATERALITY: LEFT TUMOR FOCALITY: Unifocal TUMOR SIZE: GREATEST DIMENSION: 1.6 cm HISTOLOGIC TYPE: HPV-mediated squamous cell carcinoma MARGINS: Involved by invasive tumor (focal) Specify location of closest margin: Tongue base (soft tissue) Note: Although an additional tongue base mucosal margin was submitted, location of tumor extension to margin appears 1 cm from the mucosal surface. LYMPHOVASCULAR INVASION: Not identified PERINEURAL INVASION: Present REGIONAL LYMPH NODES:  NUMBER OF LYMPH NODES INVOLVED: 1  NUMBER OF LYMPH NODES EXAMINED: 6  LATERALITY OF LYMPH NODES INVOLVED: Ipsilateral  SIZE OF LARGEST METASTATIC DEPOSIT: 6.8 cm  EXTRANODAL EXTENSION: Present   Distance from lymph node capsule: 6 millimeters PATHOLOGIC STAGE CLASSIFICATION (pTNM, AJCC 8TH Ed): pT1 pN1 (HPV-mediated oropharynx tumor)  pT1: Tumor 2 cm or smaller in greatest dimension  pN1: Metastasis in 4 or fewer lymph nodes ANCILLARY STUDIES:  p16 expression by immunohistochemistry: Positive  HPV by PCR: pending    05/26/2018  Cancer Staging    Staging form: Pharynx - HPV-Mediated Oropharynx, AJCC 8th Edition - Pathologic stage from 05/26/2018: Stage I (pT1, pN1, cM0, p16+) - Signed by Glenn Men, MD on 06/18/2018    07/10/2018 - 07/10/2018 Chemotherapy    The patient had palonosetron (ALOXI) injection 0.25 mg, 0.25 mg, Intravenous,  Once, 0 of 3 cycles CISplatin (PLATINOL) 198 mg in sodium chloride 0.9 % 500 mL chemo infusion, 100 mg/m2 = 198 mg, Intravenous,  Once, 0 of 3 cycles fosaprepitant (EMEND) 150 mg, dexamethasone (DECADRON) 12 mg in sodium chloride 0.9 % 145 mL IVPB, , Intravenous,  Once, 0 of 3 cycles  for chemotherapy treatment.     07/10/2018 -  Chemotherapy    The patient had palonosetron (ALOXI) injection 0.25 mg, 0.25 mg, Intravenous,  Once, 0 of 7 cycles CISplatin (PLATINOL) 79 mg in sodium chloride 0.9 % 250 mL chemo infusion, 40 mg/m2 = 79 mg, Intravenous,  Once, 0 of 7 cycles fosaprepitant (EMEND) 150 mg, dexamethasone (DECADRON) 12 mg in sodium chloride 0.9 % 145 mL IVPB, , Intravenous,  Once, 0 of 7 cycles  for chemotherapy treatment.      Malignant neoplasm of base of tongue ([place])   05/09/2018 Initial Diagnosis    Malignant neoplasm of base of tongue ([place])    05/09/2018 Cancer Staging    Staging form: Pharynx - HPV-Mediated Oropharynx, AJCC 8th Edition - Clinical: Stage I (cT1, cN1, cM0, p16+) - Signed by Glenn Gibson, MD on 05/09/2018    06/11/2018 Cancer Staging    Staging form: Pharynx - HPV-Mediated Oropharynx, AJCC 8th Edition - Pathologic: Stage I (pT1, pN1, cM0, p16+) - Signed by Isidore Moos,  Judson Roch, MD on 06/11/2018     REVIEW OF SYSTEMS:   Constitutional: ( - ) fevers, ( - )  chills , ( - ) night sweats Eyes: ( - ) blurriness of vision, ( - ) double vision, ( - ) watery eyes Ears, nose, mouth, throat, and face: ( - ) mucositis, ( - ) sore throat Respiratory: ( - ) cough, ( - ) dyspnea, ( - ) wheezes Cardiovascular: ( - ) palpitation, ( - ) chest discomfort, ( - ) lower extremity  swelling Gastrointestinal:  ( - ) nausea, ( - ) heartburn, ( - ) change in bowel habits Skin: ( - ) abnormal skin rashes Lymphatics: ( - ) new lymphadenopathy, ( - ) easy bruising Neurological: ( - ) numbness, ( - ) tingling, ( - ) new weaknesses Behavioral/Psych: ( - ) mood change, ( - ) new changes  All other systems were reviewed with the patient and are negative.  I have reviewed the past medical history, past surgical history, social history and family history with the patient and they are unchanged from previous note.  ALLERGIES:  has No Known Allergies.  MEDICATIONS:  Current Outpatient Medications  Medication Sig Dispense Refill  . Cholecalciferol (VITAMIN D3) 50 MCG (2000 UT) TABS Take 2,000 Units by mouth daily.    Marland Kitchen dexamethasone (DECADRON) 4 MG tablet Take 2 tablets by mouth once a day on the day after chemotherapy and then take 2 tablets two times a day for 2 days. Take with food. 30 tablet 1  . famotidine (PEPCID) 20 MG tablet Take 20 mg by mouth daily as needed for heartburn or indigestion.    Marland Kitchen GLUCOSAMINE-CHONDROITIN PO Take 1 tablet by mouth daily.    Marland Kitchen lidocaine-prilocaine (EMLA) cream Apply to affected area once 30 g 3  . LORazepam (ATIVAN) 0.5 MG tablet Take 1 tablet (0.5 mg total) by mouth every 6 (six) hours as needed (Nausea or vomiting). 30 tablet 0  . Magnesium 500 MG TABS Take 500 mg by mouth daily.    . Morphine Sulfate (MORPHINE CONCENTRATE) 10 mg / 0.5 ml concentrated solution Take 0.5 mLs (10 mg total) by mouth every 4 (four) hours as needed for up to 30 days for severe pain. 120 mL 0  . ondansetron (ZOFRAN) 8 MG tablet Take 1 tablet (8 mg total) by mouth 2 (two) times daily as needed. Start on the third day after chemotherapy. 30 tablet 1  . oxyCODONE (OXY IR/ROXICODONE) 5 MG immediate release tablet PLEASE SEE ATTACHED FOR DETAILED DIRECTIONS    . pramipexole (MIRAPEX) 0.5 MG tablet     . prochlorperazine (COMPAZINE) 10 MG tablet Take 1 tablet (10 mg total)  by mouth every 6 (six) hours as needed (Nausea or vomiting). 30 tablet 1  . sodium fluoride (PREVIDENT 5000 PLUS) 1.1 % CREA dental cream Apply to tooth brush. Brush teeth for 2 minutes. Spit out excess. DO NOT rinse afterwards. Repeat nightly. 1 Tube prn   No current facility-administered medications for this visit.    Facility-Administered Medications Ordered in Other Visits  Medication Dose Route Frequency Provider Last Rate Last Dose  . sodium chloride flush (NS) 0.9 % injection 10 mL  10 mL Intracatheter PRN Isaias Sakai, MD   10 mL at 07/09/18 1115    PHYSICAL EXAMINATION: ECOG PERFORMANCE STATUS: 0 - Asymptomatic  There were no vitals filed for this visit. There is no height or weight on file to calculate BMI.  There were no vitals filed for  this visit.  GENERAL: alert, no distress and comfortable SKIN: skin color, texture, turgor are normal, no rashes or significant lesions EYES: conjunctiva are pink and non-injected, sclera clear OROPHARYNX: no exudate, no erythema; lips, buccal mucosa, and tongue normal  NECK: supple, non-tender, left neck dissection site healing well  LYMPH:  no palpable lymphadenopathy in the cervical LUNGS: clear to auscultation with normal breathing effort HEART: regular rate & rhythm and no murmurs and no lower extremity edema ABDOMEN: soft, non-tender, non-distended, normal bowel sounds Musculoskeletal: no cyanosis of digits and no clubbing  PSYCH: alert & oriented x 3, fluent speech NEURO: no focal motor/sensory deficits  LABORATORY DATA:  I have reviewed the data as listed    Component Value Date/Time   NA 143 07/09/2018 1120   K 3.6 07/09/2018 1120   CL 106 07/09/2018 1120   CO2 29 07/09/2018 1120   GLUCOSE 96 07/09/2018 1120   BUN 15 07/09/2018 1120   CREATININE 0.83 07/09/2018 1120   CALCIUM 9.1 07/09/2018 1120   PROT 7.2 06/18/2018 1105   ALBUMIN 3.8 06/18/2018 1105   AST 17 06/18/2018 1105   ALT 38 06/18/2018 1105   ALKPHOS 77  06/18/2018 1105   BILITOT 0.4 06/18/2018 1105   GFRNONAA >60 07/09/2018 1120   GFRAA >60 07/09/2018 1120    No results found for: SPEP, UPEP  Lab Results  Component Value Date   WBC 8.2 07/09/2018   NEUTROABS 5.4 07/09/2018   HGB 14.4 07/09/2018   HCT 43.3 07/09/2018   MCV 93.7 07/09/2018   PLT 305 07/09/2018      Chemistry      Component Value Date/Time   NA 143 07/09/2018 1120   K 3.6 07/09/2018 1120   CL 106 07/09/2018 1120   CO2 29 07/09/2018 1120   BUN 15 07/09/2018 1120   CREATININE 0.83 07/09/2018 1120      Component Value Date/Time   CALCIUM 9.1 07/09/2018 1120   ALKPHOS 77 06/18/2018 1105   AST 17 06/18/2018 1105   ALT 38 06/18/2018 1105   BILITOT 0.4 06/18/2018 1105       RADIOGRAPHIC STUDIES: I have personally reviewed the radiological images as listed below and agreed with the findings in the report. Ir Gastrostomy Tube Mod Sed  Result Date: 06/30/2018 INDICATION: History of head neck cancer. Please place percutaneous gastrostomy tube for enteric nutrition supplementation purposes prior to initiation radiation chemoradiation. EXAM: PULL TROUGH GASTROSTOMY TUBE PLACEMENT COMPARISON:  PET-CT - 04/29/2018 MEDICATIONS: Ancef 2 gm IV; Antibiotics were administered within 1 hour of the procedure. Glucagon 1 mg IV CONTRAST:  10 cc Isovue-300, administered into the gastric lumen. ANESTHESIA/SEDATION: Moderate (conscious) sedation was employed during this procedure. A total of Versed 1 mg was administered intravenously. Moderate Sedation Time: 16 minutes. The patient's level of consciousness and vital signs were monitored continuously by radiology nursing throughout the procedure under my direct supervision. FLUOROSCOPY TIME:  1 minute, 48 seconds (25 mGy) COMPLICATIONS: None immediate. PROCEDURE: Informed written consent was obtained from the patient following explanation of the procedure, risks, benefits and alternatives. A time out was performed prior to the initiation  of the procedure. Ultrasound scanning was performed to demarcate the edge of the left lobe of the liver. Maximal barrier sterile technique utilized including caps, mask, sterile gowns, sterile gloves, large sterile drape, hand hygiene and Betadine prep. The left upper quadrant was sterilely prepped and draped. An oral gastric catheter was inserted into the stomach under fluoroscopy. The existing nasogastric feeding tube was  removed. The left costal margin and air opacified transverse colon were identified and avoided. Air was injected into the stomach for insufflation and visualization under fluoroscopy. Under sterile conditions a 17 gauge trocar needle was utilized to access the stomach percutaneously beneath the left subcostal margin after the overlying soft tissues were anesthetized with 1% Lidocaine with epinephrine. Needle position was confirmed within the stomach with aspiration of air and injection of small amount of contrast. A single T tack was deployed for gastropexy. Over an Amplatz guide wire, a 9-French sheath was inserted into the stomach. A snare device was utilized to capture the oral gastric catheter. The snare device was pulled retrograde from the stomach up the esophagus and out the oropharynx. The 20-French pull-through gastrostomy was connected to the snare device and pulled antegrade through the oropharynx down the esophagus into the stomach and then through the percutaneous tract external to the patient. The gastrostomy was assembled externally. Contrast injection confirms position in the stomach. Several spot radiographic images were obtained in various obliquities for documentation. The patient tolerated procedure well without immediate post procedural complication. FINDINGS: After successful fluoroscopic guided placement, the gastrostomy tube is appropriately positioned with internal disc against the ventral aspect of the gastric lumen. IMPRESSION: Successful fluoroscopic insertion of a  20-French pull-through gastrostomy tube. The gastrostomy may be used immediately for medication administration and in 24 hrs for the initiation of feeds. Electronically Signed   By: Sandi Mariscal M.D.   On: 06/30/2018 13:26   Ir Imaging Guided Port Insertion  Result Date: 06/30/2018 INDICATION: History of head neck cancer. In need of durable intravenous access for chemotherapy administration. EXAM: IMPLANTED PORT A CATH PLACEMENT WITH ULTRASOUND AND FLUOROSCOPIC GUIDANCE COMPARISON:  PET-CT-04/29/2018 MEDICATIONS: Ancef 2 gm IV; The antibiotic was administered within an appropriate time interval prior to skin puncture. ANESTHESIA/SEDATION: Moderate (conscious) sedation was employed during this procedure. A total of Versed 3 mg and Fentanyl 100 mcg was administered intravenously. Moderate Sedation Time: 24 minutes. The patient's level of consciousness and vital signs were monitored continuously by radiology nursing throughout the procedure under my direct supervision. CONTRAST:  None FLUOROSCOPY TIME:  30 seconds (11 mGy) COMPLICATIONS: None immediate. PROCEDURE: The procedure, risks, benefits, and alternatives were explained to the patient. Questions regarding the procedure were encouraged and answered. The patient understands and consents to the procedure. The right neck and chest were prepped with chlorhexidine in a sterile fashion, and a sterile drape was applied covering the operative field. Maximum barrier sterile technique with sterile gowns and gloves were used for the procedure. A timeout was performed prior to the initiation of the procedure. Local anesthesia was provided with 1% lidocaine with epinephrine. After creating a small venotomy incision, a micropuncture kit was utilized to access the internal jugular vein. Real-time ultrasound guidance was utilized for vascular access including the acquisition of a permanent ultrasound image documenting patency of the accessed vessel. The microwire was utilized  to measure appropriate catheter length. A subcutaneous port pocket was then created along the upper chest wall utilizing a combination of sharp and blunt dissection. The pocket was irrigated with sterile saline. A single lumen thin power injectable port was chosen for placement. The 8 Fr catheter was tunneled from the port pocket site to the venotomy incision. The port was placed in the pocket. The external catheter was trimmed to appropriate length. At the venotomy, an 8 Fr peel-away sheath was placed over a guidewire under fluoroscopic guidance. The catheter was then placed through the sheath  and the sheath was removed. Final catheter positioning was confirmed and documented with a fluoroscopic spot radiograph. The port was accessed with a Huber needle, aspirated and flushed with heparinized saline. The venotomy site was closed with an interrupted 4-0 Vicryl suture. The port pocket incision was closed with interrupted 2-0 Vicryl suture and the skin was opposed with a running subcuticular 4-0 Vicryl suture. Dermabond and Steri-strips were applied to both incisions. Dressings were placed. The patient tolerated the procedure well without immediate post procedural complication. FINDINGS: After catheter placement, the tip lies within the superior cavoatrial junction. The catheter aspirates and flushes normally and is ready for immediate use. IMPRESSION: Successful placement of a right internal jugular approach power injectable Port-A-Cath. The catheter is ready for immediate use. Electronically Signed   By: Sandi Mariscal M.D.   On: 06/30/2018 13:22

## 2018-07-10 ENCOUNTER — Inpatient Hospital Stay (HOSPITAL_BASED_OUTPATIENT_CLINIC_OR_DEPARTMENT_OTHER): Payer: Commercial Managed Care - PPO | Admitting: Medical

## 2018-07-10 ENCOUNTER — Inpatient Hospital Stay: Payer: Commercial Managed Care - PPO | Admitting: Nutrition

## 2018-07-10 ENCOUNTER — Ambulatory Visit
Admission: RE | Admit: 2018-07-10 | Discharge: 2018-07-10 | Disposition: A | Discharge status: Home / Self Care | Payer: Commercial Managed Care - PPO | Source: Ambulatory Visit | Attending: Radiation Oncology | Admitting: Radiation Oncology

## 2018-07-10 ENCOUNTER — Encounter: Payer: Self-pay | Admitting: Hematology

## 2018-07-10 ENCOUNTER — Encounter: Payer: Self-pay | Admitting: *Deleted

## 2018-07-10 ENCOUNTER — Inpatient Hospital Stay: Payer: Commercial Managed Care - PPO

## 2018-07-10 ENCOUNTER — Other Ambulatory Visit: Payer: Self-pay | Admitting: Medical

## 2018-07-10 DIAGNOSIS — Z5111 Encounter for antineoplastic chemotherapy: Secondary | ICD-10-CM | POA: Diagnosis not present

## 2018-07-10 DIAGNOSIS — C099 Malignant neoplasm of tonsil, unspecified: Secondary | ICD-10-CM

## 2018-07-10 MED ORDER — SODIUM CHLORIDE 0.9 % IV SOLN
Freq: Once | INTRAVENOUS | Status: AC
  Administered 2018-07-10: 10:00:00 via INTRAVENOUS
  Filled 2018-07-10: qty 250

## 2018-07-10 MED ORDER — PHENAZOPYRIDINE HCL 200 MG PO TABS: 200.0000 mg | ORAL_TABLET | Freq: Three times a day (TID) | ORAL | 0 refills | Status: DC | PRN

## 2018-07-10 MED ORDER — SODIUM CHLORIDE 0.9 % IV SOLN
40.0000 mg/m2 | Freq: Once | INTRAVENOUS | Status: AC
  Administered 2018-07-10: 79 mg via INTRAVENOUS
  Filled 2018-07-10: qty 79

## 2018-07-10 MED ORDER — SODIUM CHLORIDE 0.9 % IV SOLN
Freq: Once | INTRAVENOUS | Status: AC
  Administered 2018-07-10: 13:00:00 via INTRAVENOUS
  Filled 2018-07-10: qty 5

## 2018-07-10 MED ORDER — HEPARIN SOD (PORK) LOCK FLUSH 100 UNIT/ML IV SOLN
500.0000 [IU] | Freq: Once | INTRAVENOUS | Status: AC | PRN
  Administered 2018-07-10: 500 [IU]
  Filled 2018-07-10: qty 5

## 2018-07-10 MED ORDER — PALONOSETRON HCL INJECTION 0.25 MG/5ML
0.2500 mg | Freq: Once | INTRAVENOUS | Status: AC
  Administered 2018-07-10: 0.25 mg via INTRAVENOUS

## 2018-07-10 MED ORDER — PALONOSETRON HCL INJECTION 0.25 MG/5ML
INTRAVENOUS | Status: AC
  Filled 2018-07-10: qty 5

## 2018-07-10 MED ORDER — SODIUM CHLORIDE 0.9% FLUSH
10.0000 mL | INTRAVENOUS | Status: DC | PRN
  Administered 2018-07-10: 10 mL
  Filled 2018-07-10: qty 10

## 2018-07-10 MED ORDER — POTASSIUM CHLORIDE 2 MEQ/ML IV SOLN
Freq: Once | INTRAVENOUS | Status: AC
  Administered 2018-07-10: 11:00:00 via INTRAVENOUS
  Filled 2018-07-10: qty 10

## 2018-07-10 NOTE — Progress Notes (Signed)
Nutrition follow-up completed with patient and wife during infusion for left base of tonsil/tonsillar sulcus; stage I, P 16+. Patient is receiving his first chemotherapy today. Weight improved and documented as 183.7 pounds February 19, increased from 179.5 pounds January 28. Patient is status post port and PEG placement on February 2020. Patient currently does not have any nutrition impact symptoms.  Estimated nutrition needs: 2200-2400 cal, 110-125 g protein, 2.4 L fluid.  Nutrition diagnosis: Predicted suboptimal energy intake continues.  Intervention: Patient educated to continue strategies for adequate oral intake including oral nutrition supplements. Provided 1 complementary case of Ensure Enlive. Encourage patient to take nausea medications as needed after chemotherapy. Questions were answered.  Teach back method was used.  Monitoring, evaluation, goals: Patient will tolerate increased calories and protein to minimize weight loss throughout treatment.  Next visit: Friday, February 28 during infusion.  **Disclaimer: This note was dictated with voice recognition software. Similar sounding words can inadvertently be transcribed and this note may contain transcription errors which may not have been corrected upon publication of note.**

## 2018-07-10 NOTE — Patient Instructions (Signed)
[facility] Discharge Instructions for Patients Receiving Chemotherapy  Today you received the following chemotherapy agents: Cisplatin (Platinol)  To help prevent nausea and vomiting after your treatment, we encourage you to take your nausea medication as directed. Received Aloxi during your treatment today-->Take your Compazine prescription (not Zofran) for the next 3 days as needed. Can add Zofran starting Sunday 07/13/18   If you develop nausea and vomiting that is not controlled by your nausea medication, call the clinic.   BELOW ARE SYMPTOMS THAT SHOULD BE REPORTED IMMEDIATELY:  *FEVER GREATER THAN 100.5 F  *CHILLS WITH OR WITHOUT FEVER  NAUSEA AND VOMITING THAT IS NOT CONTROLLED WITH YOUR NAUSEA MEDICATION  *UNUSUAL SHORTNESS OF BREATH  *UNUSUAL BRUISING OR BLEEDING  TENDERNESS IN MOUTH AND THROAT WITH OR WITHOUT PRESENCE OF ULCERS  *URINARY PROBLEMS  *BOWEL PROBLEMS  UNUSUAL RASH Items with * indicate a potential emergency and should be followed up as soon as possible.  Feel free to call the clinic should you have any questions or concerns. The clinic phone number is [phone].  Please show the [facility] at check-in to the Emergency Department and triage nurse.  Cisplatin injection What is this medicine? CISPLATIN (SIS pla tin) is a chemotherapy drug. It targets fast dividing cells, like cancer cells, and causes these cells to die. This medicine is used to treat many types of cancer like bladder, ovarian, and testicular cancers. This medicine may be used for other purposes; ask your health care provider or pharmacist if you have questions. COMMON BRAND NAME(S): Platinol, Platinol -AQ What should I tell my health care provider before I take this medicine? They need to know if you have any of these conditions: -blood disorders -hearing problems -kidney disease -recent or ongoing radiation therapy -an unusual or allergic reaction to  cisplatin, carboplatin, other chemotherapy, other medicines, foods, dyes, or preservatives -pregnant or trying to get pregnant -breast-feeding How should I use this medicine? This drug is given as an infusion into a vein. It is administered in a hospital or clinic by a specially trained health care professional. Talk to your pediatrician regarding the use of this medicine in children. Special care may be needed. Overdosage: If you think you have taken too much of this medicine contact a poison control center or emergency room at once. NOTE: This medicine is only for you. Do not share this medicine with others. What if I miss a dose? It is important not to miss a dose. Call your doctor or health care professional if you are unable to keep an appointment. What may interact with this medicine? -dofetilide -foscarnet -medicines for seizures -medicines to increase blood counts like filgrastim, pegfilgrastim, sargramostim -probenecid -pyridoxine used with altretamine -rituximab -some antibiotics like amikacin, gentamicin, neomycin, polymyxin B, streptomycin, tobramycin -sulfinpyrazone -vaccines -zalcitabine Talk to your doctor or health care professional before taking any of these medicines: -acetaminophen -aspirin -ibuprofen -ketoprofen -naproxen This list may not describe all possible interactions. Give your health care provider a list of all the medicines, herbs, non-prescription drugs, or dietary supplements you use. Also tell them if you smoke, drink alcohol, or use illegal drugs. Some items may interact with your medicine. What should I watch for while using this medicine? Your condition will be monitored carefully while you are receiving this medicine. You will need important blood work done while you are taking this medicine. This drug may make you feel generally unwell. This is not uncommon, as chemotherapy can affect healthy cells as  well as cancer cells. Report any side effects.  Continue your course of treatment even though you feel ill unless your doctor tells you to stop. In some cases, you may be given additional medicines to help with side effects. Follow all directions for their use. Call your doctor or health care professional for advice if you get a fever, chills or sore throat, or other symptoms of a cold or flu. Do not treat yourself. This drug decreases your body's ability to fight infections. Try to avoid being around people who are sick. This medicine may increase your risk to bruise or bleed. Call your doctor or health care professional if you notice any unusual bleeding. Be careful brushing and flossing your teeth or using a toothpick because you may get an infection or bleed more easily. If you have any dental work done, tell your dentist you are receiving this medicine. Avoid taking products that contain aspirin, acetaminophen, ibuprofen, naproxen, or ketoprofen unless instructed by your doctor. These medicines may hide a fever. Do not become pregnant while taking this medicine. Women should inform their doctor if they wish to become pregnant or think they might be pregnant. There is a potential for serious side effects to an unborn child. Talk to your health care professional or pharmacist for more information. Do not breast-feed an infant while taking this medicine. Drink fluids as directed while you are taking this medicine. This will help protect your kidneys. Call your doctor or health care professional if you get diarrhea. Do not treat yourself. What side effects may I notice from receiving this medicine? Side effects that you should report to your doctor or health care professional as soon as possible: -allergic reactions like skin rash, itching or hives, swelling of the face, lips, or tongue -signs of infection - fever or chills, cough, sore throat, pain or difficulty passing urine -signs of decreased platelets or bleeding - bruising, pinpoint red spots  on the skin, black, tarry stools, nosebleeds -signs of decreased red blood cells - unusually weak or tired, fainting spells, lightheadedness -breathing problems -changes in hearing -gout pain -low blood counts - This drug may decrease the number of white blood cells, red blood cells and platelets. You may be at increased risk for infections and bleeding. -nausea and vomiting -pain, swelling, redness or irritation at the injection site -pain, tingling, numbness in the hands or feet -problems with balance, movement -trouble passing urine or change in the amount of urine Side effects that usually do not require medical attention (report to your doctor or health care professional if they continue or are bothersome): -changes in vision -loss of appetite -metallic taste in the mouth or changes in taste This list may not describe all possible side effects. Call your doctor for medical advice about side effects. You may report side effects to FDA at 1-800-FDA-1088. Where should I keep my medicine? This drug is given in a hospital or clinic and will not be stored at home. NOTE: This sheet is a summary. It may not cover all possible information. If you have questions about this medicine, talk to your doctor, pharmacist, or health care provider.  2019 [employer]vier/[employer] Standard (2007-08-12 14:40:54)

## 2018-07-10 NOTE — Progress Notes (Signed)
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  Gave my card if interested in applying and for any additional financial questions or concerns.

## 2018-07-11 ENCOUNTER — Ambulatory Visit
Admission: RE | Admit: 2018-07-11 | Discharge: 2018-07-11 | Disposition: A | Discharge status: Home / Self Care | Payer: Commercial Managed Care - PPO | Source: Ambulatory Visit | Attending: Radiation Oncology | Admitting: Radiation Oncology

## 2018-07-11 ENCOUNTER — Encounter: Payer: Self-pay | Admitting: *Deleted

## 2018-07-11 DIAGNOSIS — Z5111 Encounter for antineoplastic chemotherapy: Secondary | ICD-10-CM | POA: Diagnosis not present

## 2018-07-11 DIAGNOSIS — C099 Malignant neoplasm of tonsil, unspecified: Secondary | ICD-10-CM | POA: Diagnosis not present

## 2018-07-11 NOTE — Progress Notes (Signed)
The patient was seen in infusion today.  He reported that he had been having some episodes of mild flank pain that radiated to the suprapubic area.  These had happened last evening and early this morning when he was up to urinate.  He has been drinking significant volumes of water given that he has being treated with cisplatin.  He has had no additional episodes of flank pain or suprapubic discomfort today.  He denies fevers, chills, sweats, he is having no dysuria.  He was given a prescription for Pyridium.  No antibiotic was given.  The patient was instructed to present to [facility] for a urinalysis should his symptoms continue.  He is agreeable with this.  Sandi Mealy, MHS, PA-C Physician Assistant

## 2018-07-12 NOTE — Progress Notes (Signed)
Oncology Nurse Navigator Documentation  In follow-up to call from Mr. Falletta this morning and expressed concern re PEG insertion ("reddness seems to be getting bigger"), I met with him s/p today's RT to evaluate. Upon examination, dsg CD&I; insertion site with some erythema but WNL for recent placement (2/10), no evidence of discharge. He reported continuation of some abdominal tenderness s/p insertion but he attributed to overexertion of abdominal muscles days following. I encouraged him to call with future concern.  [name], RN, BSN Head & Neck Oncology Nurse [place] at [place] [phone]

## 2018-07-12 NOTE — Progress Notes (Signed)
Oncology Nurse Navigator Documentation  Met with Glenn Goodman in Infusion where he was receiving initial LD cisplatin infusion.  His wife was chairside. He indicated infusion going well, denied needs/concerns. He reported notable abdominal soreness (not rated) s/p 2/10 PEG placement, asked how long it will continue.  He recognized floor HEP instructed by SLP Carl Schinke and shooting indoor basketball hoops earlier this week may be contributing to tenderness.  I explained WNL for abdominal discomfort for the first couple of weeks followingf PEG placement, encouraged him to cease floor HEP and to minimize abdominal muscle exertion for another week or so to allow for additional recovery from placement.  He voiced understanding. I encouraged him to call me with future needs/concerns.  Rick Diehl, RN, BSN Head & Neck Oncology Nurse Navigator [facility] at [place] [phone]  

## 2018-07-14 ENCOUNTER — Other Ambulatory Visit: Payer: Self-pay | Admitting: Radiation Oncology

## 2018-07-14 ENCOUNTER — Telehealth: Payer: Self-pay | Admitting: *Deleted

## 2018-07-14 ENCOUNTER — Ambulatory Visit
Admission: RE | Admit: 2018-07-14 | Discharge: 2018-07-14 | Disposition: A | Discharge status: Home / Self Care | Payer: Commercial Managed Care - PPO | Source: Ambulatory Visit | Attending: Radiation Oncology | Admitting: Radiation Oncology

## 2018-07-14 DIAGNOSIS — C099 Malignant neoplasm of tonsil, unspecified: Secondary | ICD-10-CM | POA: Diagnosis not present

## 2018-07-14 DIAGNOSIS — Z5111 Encounter for antineoplastic chemotherapy: Secondary | ICD-10-CM | POA: Diagnosis not present

## 2018-07-14 MED ORDER — LIDOCAINE VISCOUS HCL 2 % MT SOLN: OROMUCOSAL | 5 refills | Status: DC

## 2018-07-14 MED ORDER — FLUCONAZOLE 100 MG PO TABS: ORAL_TABLET | ORAL | 0 refills | Status: DC

## 2018-07-14 NOTE — Telephone Encounter (Signed)
-----   Message from Zola Button, RN sent at 07/10/2018  5:16 PM EST ----- Regarding: Dr. Maylon Peppers; First time F/U Call Pt received first time Cisplatin 07/10/18. Tolerated well.   Just FYI: Lucianne Lei came to see him right before he left because he was complaining of some occasional discomfort while voiding (happened once in the morning before radiation, once during treatment, and and once after treatment). Lucianne Lei sent him a prescription for Pyridium and ordered a UA for him 07/11/18 after his radiation if the symptom still persists.   (I've copied Dr. Maylon Peppers to this message so he's aware as well).   Thanks! Katelin

## 2018-07-14 NOTE — Telephone Encounter (Signed)
Called Glenn Goodman Glenn Goodman for chemotherapy F/U.   "Experienced continuous hiccups.  On-call provider ordered to stop dexamethasone, use nausea medications.  Compazine affected my restless leg syndrome.  I'll discuss other options when I see Dr. Maylon Peppers Wednesday.  No need to use feeding tube; eating and drinking by mouth.  Noticed thick, salty saliva.  Dr. Isidore Moos ordered lidocaine for yeast in mouth."   Denies further side effects or symptoms.  Bowel and bladder functioning well.  Instructed to drink 64 oz minimum daily or at least the day before, of and after treatment.   No questions or needs at this time.  Encouraged to call [phone] Mon -Fri 8:00 am - 4:30 pm or anytime as needed for symptoms, changes or event outside office hours.

## 2018-07-15 ENCOUNTER — Ambulatory Visit
Admission: RE | Admit: 2018-07-15 | Discharge: 2018-07-15 | Disposition: A | Discharge status: Home / Self Care | Payer: Commercial Managed Care - PPO | Source: Ambulatory Visit | Attending: Radiation Oncology | Admitting: Radiation Oncology

## 2018-07-15 DIAGNOSIS — Z5111 Encounter for antineoplastic chemotherapy: Secondary | ICD-10-CM | POA: Diagnosis not present

## 2018-07-15 DIAGNOSIS — C099 Malignant neoplasm of tonsil, unspecified: Secondary | ICD-10-CM | POA: Diagnosis not present

## 2018-07-16 ENCOUNTER — Other Ambulatory Visit: Payer: Self-pay

## 2018-07-16 ENCOUNTER — Ambulatory Visit
Admission: RE | Admit: 2018-07-16 | Discharge: 2018-07-16 | Disposition: A | Discharge status: Home / Self Care | Payer: Commercial Managed Care - PPO | Source: Ambulatory Visit | Attending: Radiation Oncology | Admitting: Radiation Oncology

## 2018-07-16 ENCOUNTER — Telehealth: Payer: Self-pay | Admitting: Hematology

## 2018-07-16 ENCOUNTER — Inpatient Hospital Stay: Payer: Commercial Managed Care - PPO

## 2018-07-16 ENCOUNTER — Inpatient Hospital Stay (HOSPITAL_BASED_OUTPATIENT_CLINIC_OR_DEPARTMENT_OTHER): Payer: Commercial Managed Care - PPO | Admitting: Hematology

## 2018-07-16 VITALS — BP 130/86 | HR 80 | Temp 97.9°F | Resp 18 | Ht 68.0 in | Wt 181.8 lb

## 2018-07-16 DIAGNOSIS — Z5111 Encounter for antineoplastic chemotherapy: Secondary | ICD-10-CM | POA: Diagnosis not present

## 2018-07-16 DIAGNOSIS — C099 Malignant neoplasm of tonsil, unspecified: Secondary | ICD-10-CM

## 2018-07-16 DIAGNOSIS — C601 Malignant neoplasm of glans penis: Secondary | ICD-10-CM

## 2018-07-16 DIAGNOSIS — Z95828 Presence of other vascular implants and grafts: Secondary | ICD-10-CM

## 2018-07-16 DIAGNOSIS — T451X5A Adverse effect of antineoplastic and immunosuppressive drugs, initial encounter: Secondary | ICD-10-CM

## 2018-07-16 DIAGNOSIS — Z79899 Other long term (current) drug therapy: Secondary | ICD-10-CM

## 2018-07-16 DIAGNOSIS — K521 Toxic gastroenteritis and colitis: Secondary | ICD-10-CM

## 2018-07-16 DIAGNOSIS — H903 Sensorineural hearing loss, bilateral: Secondary | ICD-10-CM

## 2018-07-16 DIAGNOSIS — B370 Candidal stomatitis: Secondary | ICD-10-CM

## 2018-07-16 DIAGNOSIS — R066 Hiccough: Secondary | ICD-10-CM

## 2018-07-16 DIAGNOSIS — R110 Nausea: Secondary | ICD-10-CM

## 2018-07-16 LAB — CBC WITH DIFFERENTIAL (CANCER CENTER ONLY)
Abs Immature Granulocytes: 0.2 10*3/uL — ABNORMAL HIGH (ref 0.00–0.07)
Basophils Absolute: 0.1 10*3/uL (ref 0.0–0.1)
Basophils Relative: 1 %
Eosinophils Absolute: 0.1 10*3/uL (ref 0.0–0.5)
Eosinophils Relative: 1 %
HCT: 46 % (ref 39.0–52.0)
Hemoglobin: 15.4 g/dL (ref 13.0–17.0)
Immature Granulocytes: 2 %
Lymphocytes Relative: 13 %
Lymphs Abs: 1.3 10*3/uL (ref 0.7–4.0)
MCH: 31 pg (ref 26.0–34.0)
MCHC: 33.5 g/dL (ref 30.0–36.0)
MCV: 92.6 fL (ref 80.0–100.0)
Monocytes Absolute: 1 10*3/uL (ref 0.1–1.0)
Monocytes Relative: 10 %
Neutro Abs: 7.3 10*3/uL (ref 1.7–7.7)
Neutrophils Relative %: 73 %
Platelet Count: 315 10*3/uL (ref 150–400)
RBC: 4.97 MIL/uL (ref 4.22–5.81)
RDW: 12.9 % (ref 11.5–15.5)
WBC Count: 9.9 10*3/uL (ref 4.0–10.5)
nRBC: 0 % (ref 0.0–0.2)

## 2018-07-16 LAB — BASIC METABOLIC PANEL - CANCER CENTER ONLY
Anion gap: 9 (ref 5–15)
BUN: 22 mg/dL — ABNORMAL HIGH (ref 6–20)
CO2: 29 mmol/L (ref 22–32)
CREATININE: 0.84 mg/dL (ref 0.61–1.24)
Calcium: 9.7 mg/dL (ref 8.9–10.3)
Chloride: 99 mmol/L (ref 98–111)
GFR, Est AFR Am: >60 mL/min (ref >60)
GFR, Estimated: >60 mL/min (ref >60)
Glucose, Bld: 91 mg/dL (ref 70–99)
Potassium: 4.1 mmol/L (ref 3.5–5.1)
Sodium: 137 mmol/L (ref 135–145)

## 2018-07-16 LAB — MAGNESIUM: Magnesium: 1.9 mg/dL (ref 1.7–2.4)

## 2018-07-16 MED ORDER — HEPARIN SOD (PORK) LOCK FLUSH 100 UNIT/ML IV SOLN
500.0000 [IU] | Freq: Once | INTRAVENOUS | Status: AC | PRN
  Administered 2018-07-16: 500 [IU]
  Filled 2018-07-16: qty 5

## 2018-07-16 MED ORDER — CYCLOBENZAPRINE HCL 10 MG PO TABS: 10.0000 mg | ORAL_TABLET | Freq: Three times a day (TID) | ORAL | 2 refills | Status: AC | PRN

## 2018-07-16 MED ORDER — SODIUM CHLORIDE 0.9% FLUSH
10.0000 mL | INTRAVENOUS | Status: DC | PRN
  Administered 2018-07-16: 10 mL
  Filled 2018-07-16: qty 10

## 2018-07-16 NOTE — Telephone Encounter (Signed)
Per 02/26 los No change in appts.  Printed avs.

## 2018-07-16 NOTE — Progress Notes (Signed)
[place] OFFICE PROGRESS NOTE  Patient Care Team: [name], MD as PCP - General (Family Medicine) Melissa Montane, MD as Consulting Physician (Otolaryngology) Eppie Gibson, MD as Attending Physician (Radiation Oncology) Tish Men, MD as Consulting Physician (Hematology) Leota Sauers, RN as Oncology Nurse Navigator [place], [place], [place] as Speech Language Pathologist (Speech Pathology) Karie Mainland, RD as Dietitian (Nutrition) Wynelle Beckmann, Melodie Bouillon, PT as Physical Therapist (Physical Therapy) Kennith Center, LCSW as Social Worker Francina Ames, MD as Referring Physician (Otolaryngology)  HEME/ONC OVERVIEW: 1. Stage I (pT1pN1M0) left tonsil SCCa, p16+ -02/2018: CT neck showed enlarged left Level II and II LN with central necrosis (largest 2.4cm); effacement of the left IJ vein noted  -03/2018: US-guided bx of left cervical LN; path showed SCCa, p16+ -04/2018: PET showed FDG-avid left palatine tonsil lesion measuring ~1.3cm with associated FDG-avid left Level IIa LN's; no evidence of metastatic disease -05/2018: TORS by Dr. Raphael Gibney at [facility]; path showed HPV-positive SCCa of the left glossotonsillar sulcus, 1.6cm, focal margin positive, PNI+, 1/6 LN's positive (largest 6.8cm) with ECE; pT1pN1  -06/2018 - present: adjuvant chemoradiation with weekly cisplatin  2. Port and PEG placement in 06/2018   TREATMENT SUMMARY:  05/26/2018: TORS by Dr. Nicolette Bang at [facility]   07/07/2018 - present: adjuvant chemoradiation with weekly cisplatin 66m/m2 (due to moderate hearing loss)  ASSESSMENT & PLAN:   Stage I (cT1N1M0) left tonsil SCCa, p16+ -S/p 1 cycle of weekly cisplatin; patient is tolerating the treatment relatively well so far -Labs adequate today, proceed with Cycle 2 of weekly cisplatin on 07/18/2018  -PRN medications: Zofran, Compazine, Ativan and dexamethasone  Chemotherapy-associated nausea -Relatively well controlled with Zofran and  Ativan -Compazine caused worsening RLS symptoms -Continue PRN Zofran, Ativan and dexamethasone   Chemotherapy-associated diarrhea -Relatively well controlled -Continue PRN Imodium  Hiccups -Patient feels that the hiccups were precipitated by steroid -I instructed the patient to take dex 1 tab BID x 2 days instead of 2 tabs, which seemed to be better tolerated  -I have prescribed Flexeril for muscle spasms  Bilateral sensorineural hearing loss -No change in hearing since Cycle 1 of weekly cisplatin; occasional tinnitus, unchanged  -Continue close monitoring while on chemotherapy, given its potential ototoxicity -Plan for repeat audiology testing after completing chemoradiation; sooner if the patient develops worsening hearing loss  Mild oral candidiasis -Patient was prescribed fluconazole on 07/14/2018 for white plaques on the hard palate -On exam, there is mild posterior oropharyngeal erythema but no distinct white plaques -Continue fluconazole as prescribed  No orders of the defined types were placed in this encounter.   All questions were answered. The patient knows to call the clinic with any problems, questions or concerns. No barriers to learning was detected.  Return in 1 week for labs, port flush, and clinic appt prior to Cycle 3 of weekly cisplatin.   YTish Men MD 07/17/2018 7:41 AM  CHIEF COMPLAINT: "I am doing okay so far"  INTERVAL HISTORY: Mr. MRowandreturns to clinic for for follow-up of head neck cancer on adjuvant chemoradiation.  Patient reports that he had mild nausea after his first treatment, for which he was taking dexamethasone as prescribed after the first dose of chemotherapy.  However, he reported that on the second day of dexamethasone, he developed persistent hiccups and called the on-call physician, we instructed him to skip dexamethasone.  He also tried Compazine, which exacerbated his symptoms of restless leg syndrome.  Zofran appeared to work well for  his nausea.  He also had a one episode of diarrhea, which resolved without intervention.  He denies any hematochezia, melena, abdominal pain, or vomiting.  He reports occasional tinnitus, but it is overall unchanged and does not interfere with hearing.  He otherwise denies any new complaint today.  SUMMARY OF ONCOLOGIC HISTORY:   Squamous cell carcinoma of left tonsil ([place])   03/20/2018 Procedure    FNA of the left cervical LN in office    03/20/2018 Pathology Results    Non-diagnostic    04/07/2018 Procedure    US-guided left cervical LN biopsy/FNA     04/07/2018 Pathology Results    (Accession: [id number]) Lymph node, needle/core biopsy, left cervical - METASTATIC SQUAMOUS CELL CARCINOMA INVOLVING A LYMPH NODE (1/1) - SEE COMMENT  p16 IHC positive. EBV ISH negative.     04/22/2018 Pathology Results    (Accession: [id number]) Tonsil, biopsy, left, base of tongue - INVASIVE KERATINIZING SQUAMOUS CELL CARCINOMA.    04/29/2018 Imaging    PET:  IMPRESSION: 1. Left palatine tonsil/adjacent pharyngeal mucosal space lesion is asymmetric, measures about 1.3 cm in long axis, and has a maximum SUV of 8.2, compatible with malignancy. There is associated hypermetabolic left level IIa adenopathy as shown on prior exams. 2. Symmetric accentuated activity in the lymphoid tissue at the tongue base, maximum SUV 5.3, quite likely physiologic given the symmetry.  3. No findings of metastatic disease Pred to the chest, abdomen/pelvis, or skeleton.    05/26/2018 Surgery    Radical tonsillectomy    05/26/2018 Pathology Results    PROCEDURE: Radical tonsillectomy TUMOR SITE: Glossotonsillar sulcus TUMOR LATERALITY: LEFT TUMOR FOCALITY: Unifocal TUMOR SIZE: GREATEST DIMENSION: 1.6 cm HISTOLOGIC TYPE: HPV-mediated squamous cell carcinoma MARGINS: Involved by invasive tumor (focal) Specify location of closest margin: Tongue base (soft tissue) Note: Although an additional tongue base  mucosal margin was submitted, location of tumor extension to margin appears 1 cm from the mucosal surface. LYMPHOVASCULAR INVASION: Not identified PERINEURAL INVASION: Present REGIONAL LYMPH NODES:  NUMBER OF LYMPH NODES INVOLVED: 1  NUMBER OF LYMPH NODES EXAMINED: 6  LATERALITY OF LYMPH NODES INVOLVED: Ipsilateral  SIZE OF LARGEST METASTATIC DEPOSIT: 6.8 cm  EXTRANODAL EXTENSION: Present   Distance from lymph node capsule: 6 millimeters PATHOLOGIC STAGE CLASSIFICATION (pTNM, AJCC 8TH Ed): pT1 pN1 (HPV-mediated oropharynx tumor)  pT1: Tumor 2 cm or smaller in greatest dimension  pN1: Metastasis in 4 or fewer lymph nodes ANCILLARY STUDIES:  p16 expression by immunohistochemistry: Positive  HPV by PCR: pending    05/26/2018 Cancer Staging    Staging form: Pharynx - HPV-Mediated Oropharynx, AJCC 8th Edition - Pathologic stage from 05/26/2018: Stage I (pT1, pN1, cM0, p16+) - Signed by Tish Men, MD on 06/18/2018    07/10/2018 - 07/10/2018 Chemotherapy    The patient had palonosetron (ALOXI) injection 0.25 mg, 0.25 mg, Intravenous,  Once, 0 of 3 cycles CISplatin (PLATINOL) 198 mg in sodium chloride 0.9 % 500 mL chemo infusion, 100 mg/m2 = 198 mg, Intravenous,  Once, 0 of 3 cycles fosaprepitant (EMEND) 150 mg, dexamethasone (DECADRON) 12 mg in sodium chloride 0.9 % 145 mL IVPB, , Intravenous,  Once, 0 of 3 cycles  for chemotherapy treatment.     07/10/2018 -  Chemotherapy    The patient had palonosetron (ALOXI) injection 0.25 mg, 0.25 mg, Intravenous,  Once, 1 of 7 cycles Administration: 0.25 mg (07/10/2018) CISplatin (PLATINOL) 79 mg in sodium chloride 0.9 % 250 mL chemo infusion, 40 mg/m2 = 79 mg, Intravenous,  Once, 1 of  7 cycles Administration: 79 mg (07/10/2018) fosaprepitant (EMEND) 150 mg, dexamethasone (DECADRON) 12 mg in sodium chloride 0.9 % 145 mL IVPB, , Intravenous,  Once, 1 of 7 cycles Administration:  (07/10/2018)  for chemotherapy treatment.       Malignant neoplasm of base of tongue ([place])   05/09/2018 Initial Diagnosis    Malignant neoplasm of base of tongue ([place])    05/09/2018 Cancer Staging    Staging form: Pharynx - HPV-Mediated Oropharynx, AJCC 8th Edition - Clinical: Stage I (cT1, cN1, cM0, p16+) - Signed by Eppie Gibson, MD on 05/09/2018    06/11/2018 Cancer Staging    Staging form: Pharynx - HPV-Mediated Oropharynx, AJCC 8th Edition - Pathologic: Stage I (pT1, pN1, cM0, p16+) - Signed by Eppie Gibson, MD on 06/11/2018     REVIEW OF SYSTEMS:   Constitutional: ( - ) fevers, ( - )  chills , ( - ) night sweats Eyes: ( - ) blurriness of vision, ( - ) double vision, ( - ) watery eyes Ears, nose, mouth, throat, and face: ( - ) mucositis, ( - ) sore throat Respiratory: ( - ) cough, ( - ) dyspnea, ( - ) wheezes Cardiovascular: ( - ) palpitation, ( - ) chest discomfort, ( - ) lower extremity swelling Gastrointestinal:  ( + ) nausea, ( - ) heartburn, ( + ) change in bowel habits Skin: ( - ) abnormal skin rashes Lymphatics: ( - ) new lymphadenopathy, ( - ) easy bruising Neurological: ( - ) numbness, ( - ) tingling, ( - ) new weaknesses Behavioral/Psych: ( - ) mood change, ( - ) new changes  All other systems were reviewed with the patient and are negative.  I have reviewed the past medical history, past surgical history, social history and family history with the patient and they are unchanged from previous note.  ALLERGIES:  has No Known Allergies.  MEDICATIONS:  Current Outpatient Medications  Medication Sig Dispense Refill  . Cholecalciferol (VITAMIN D3) 50 MCG (2000 UT) TABS Take 2,000 Units by mouth daily.    . cyclobenzaprine (FLEXERIL) 10 MG tablet Take 1 tablet (10 mg total) by mouth 3 (three) times daily as needed for up to 30 days (for hiccups). 60 tablet 2  . dexamethasone (DECADRON) 4 MG tablet Take 2 tablets by mouth once a day on the day after chemotherapy and then take 2 tablets two times a day for 2 days. Take  with food. 30 tablet 1  . famotidine (PEPCID) 20 MG tablet Take 20 mg by mouth daily as needed for heartburn or indigestion.    . fluconazole (DIFLUCAN) 100 MG tablet Take 2 tablets today, then 1 tablet daily x 20 more days. 22 tablet 0  . GLUCOSAMINE-CHONDROITIN PO Take 1 tablet by mouth daily.    Marland Kitchen lidocaine (XYLOCAINE) 2 % solution Patient: Mix 1part 2% viscous lidocaine, 1part H20. Swish & swallow 11m of diluted mixture, 362m before meals and at bedtime, up to QID 100 mL 5  . lidocaine-prilocaine (EMLA) cream Apply to affected area once 30 g 3  . LORazepam (ATIVAN) 0.5 MG tablet Take 1 tablet (0.5 mg total) by mouth every 6 (six) hours as needed (Nausea or vomiting). 30 tablet 0  . Magnesium 500 MG TABS Take 500 mg by mouth daily.    . Morphine Sulfate (MORPHINE CONCENTRATE) 10 mg / 0.5 ml concentrated solution Take 0.5 mLs (10 mg total) by mouth every 4 (four) hours as needed for up to 30 days for severe  pain. 120 mL 0  . ondansetron (ZOFRAN) 8 MG tablet Take 1 tablet (8 mg total) by mouth 2 (two) times daily as needed. Start on the third day after chemotherapy. 30 tablet 1  . oxyCODONE (OXY IR/ROXICODONE) 5 MG immediate release tablet PLEASE SEE ATTACHED FOR DETAILED DIRECTIONS    . phenazopyridine (PYRIDIUM) 200 MG tablet Take 1 tablet (200 mg total) by mouth 3 (three) times daily as needed for pain. 10 tablet 0  . pramipexole (MIRAPEX) 0.5 MG tablet     . prochlorperazine (COMPAZINE) 10 MG tablet Take 1 tablet (10 mg total) by mouth every 6 (six) hours as needed (Nausea or vomiting). 30 tablet 1  . sodium fluoride (PREVIDENT 5000 PLUS) 1.1 % CREA dental cream Apply to tooth brush. Brush teeth for 2 minutes. Spit out excess. DO NOT rinse afterwards. Repeat nightly. 1 Tube prn   No current facility-administered medications for this visit.     PHYSICAL EXAMINATION: ECOG PERFORMANCE STATUS: 0 - Asymptomatic  Today's Vitals   07/16/18 1437 07/16/18 1452  BP: 130/86   Pulse: 80   Resp:  18   Temp: 97.9 F (36.6 C)   TempSrc: Oral   SpO2: 98%   Weight: 181 lb 12.8 oz (82.5 kg)   Height: 5' 8" (1.727 m)   PainSc:  0-No pain   Body mass index is 27.64 kg/m.  Filed Weights   07/16/18 1437  Weight: 181 lb 12.8 oz (82.5 kg)    GENERAL: alert, no distress and comfortable SKIN: skin color, texture, turgor are normal, no rashes or significant lesions EYES: conjunctiva are pink and non-injected, sclera clear OROPHARYNX: no exudate, no erythema; lips, buccal mucosa, and tongue normal  NECK: supple, non-tender, neck dissection scar healing well  LYMPH:  no palpable lymphadenopathy in the cervical or axillary  LUNGS: clear to auscultation with normal breathing effort HEART: regular rate & rhythm and no murmurs and no lower extremity edema ABDOMEN: soft, non-tender, non-distended, normal bowel sounds Musculoskeletal: no cyanosis of digits and no clubbing  PSYCH: alert & oriented x 3, fluent speech NEURO: no focal motor/sensory deficits  LABORATORY DATA:  I have reviewed the data as listed    Component Value Date/Time   NA 137 07/16/2018 1414   K 4.1 07/16/2018 1414   CL 99 07/16/2018 1414   CO2 29 07/16/2018 1414   GLUCOSE 91 07/16/2018 1414   BUN 22 (H) 07/16/2018 1414   CREATININE 0.84 07/16/2018 1414   CALCIUM 9.7 07/16/2018 1414   PROT 7.2 06/18/2018 1105   ALBUMIN 3.8 06/18/2018 1105   AST 17 06/18/2018 1105   ALT 38 06/18/2018 1105   ALKPHOS 77 06/18/2018 1105   BILITOT 0.4 06/18/2018 1105   GFRNONAA >60 07/16/2018 1414   GFRAA >60 07/16/2018 1414    No results found for: SPEP, UPEP  Lab Results  Component Value Date   WBC 9.9 07/16/2018   NEUTROABS 7.3 07/16/2018   HGB 15.4 07/16/2018   HCT 46.0 07/16/2018   MCV 92.6 07/16/2018   PLT 315 07/16/2018      Chemistry      Component Value Date/Time   NA 137 07/16/2018 1414   K 4.1 07/16/2018 1414   CL 99 07/16/2018 1414   CO2 29 07/16/2018 1414   BUN 22 (H) 07/16/2018 1414   CREATININE 0.84  07/16/2018 1414      Component Value Date/Time   CALCIUM 9.7 07/16/2018 1414   ALKPHOS 77 06/18/2018 1105   AST 17 06/18/2018 1105  ALT 38 06/18/2018 1105   BILITOT 0.4 06/18/2018 1105       RADIOGRAPHIC STUDIES: I have personally reviewed the radiological images as listed below and agreed with the findings in the report. Ir Gastrostomy Tube Mod Sed  Result Date: 06/30/2018 INDICATION: History of head neck cancer. Please place percutaneous gastrostomy tube for enteric nutrition supplementation purposes prior to initiation radiation chemoradiation. EXAM: PULL TROUGH GASTROSTOMY TUBE PLACEMENT COMPARISON:  PET-CT - 04/29/2018 MEDICATIONS: Ancef 2 gm IV; Antibiotics were administered within 1 hour of the procedure. Glucagon 1 mg IV CONTRAST:  10 cc Isovue-300, administered into the gastric lumen. ANESTHESIA/SEDATION: Moderate (conscious) sedation was employed during this procedure. A total of Versed 1 mg was administered intravenously. Moderate Sedation Time: 16 minutes. The patient's level of consciousness and vital signs were monitored continuously by radiology nursing throughout the procedure under my direct supervision. FLUOROSCOPY TIME:  1 minute, 48 seconds (25 mGy) COMPLICATIONS: None immediate. PROCEDURE: Informed written consent was obtained from the patient following explanation of the procedure, risks, benefits and alternatives. A time out was performed prior to the initiation of the procedure. Ultrasound scanning was performed to demarcate the edge of the left lobe of the liver. Maximal barrier sterile technique utilized including caps, mask, sterile gowns, sterile gloves, large sterile drape, hand hygiene and Betadine prep. The left upper quadrant was sterilely prepped and draped. An oral gastric catheter was inserted into the stomach under fluoroscopy. The existing nasogastric feeding tube was removed. The left costal margin and air opacified transverse colon were identified and avoided.  Air was injected into the stomach for insufflation and visualization under fluoroscopy. Under sterile conditions a 17 gauge trocar needle was utilized to access the stomach percutaneously beneath the left subcostal margin after the overlying soft tissues were anesthetized with 1% Lidocaine with epinephrine. Needle position was confirmed within the stomach with aspiration of air and injection of small amount of contrast. A single T tack was deployed for gastropexy. Over an Amplatz guide wire, a 9-French sheath was inserted into the stomach. A snare device was utilized to capture the oral gastric catheter. The snare device was pulled retrograde from the stomach up the esophagus and out the oropharynx. The 20-French pull-through gastrostomy was connected to the snare device and pulled antegrade through the oropharynx down the esophagus into the stomach and then through the percutaneous tract external to the patient. The gastrostomy was assembled externally. Contrast injection confirms position in the stomach. Several spot radiographic images were obtained in various obliquities for documentation. The patient tolerated procedure well without immediate post procedural complication. FINDINGS: After successful fluoroscopic guided placement, the gastrostomy tube is appropriately positioned with internal disc against the ventral aspect of the gastric lumen. IMPRESSION: Successful fluoroscopic insertion of a 20-French pull-through gastrostomy tube. The gastrostomy may be used immediately for medication administration and in 24 hrs for the initiation of feeds. Electronically Signed   By: Sandi Mariscal M.D.   On: 06/30/2018 13:26   Ir Imaging Guided Port Insertion  Result Date: 06/30/2018 INDICATION: History of head neck cancer. In need of durable intravenous access for chemotherapy administration. EXAM: IMPLANTED PORT A CATH PLACEMENT WITH ULTRASOUND AND FLUOROSCOPIC GUIDANCE COMPARISON:  PET-CT-04/29/2018 MEDICATIONS: Ancef  2 gm IV; The antibiotic was administered within an appropriate time interval prior to skin puncture. ANESTHESIA/SEDATION: Moderate (conscious) sedation was employed during this procedure. A total of Versed 3 mg and Fentanyl 100 mcg was administered intravenously. Moderate Sedation Time: 24 minutes. The patient's level of consciousness and vital  signs were monitored continuously by radiology nursing throughout the procedure under my direct supervision. CONTRAST:  None FLUOROSCOPY TIME:  30 seconds (11 mGy) COMPLICATIONS: None immediate. PROCEDURE: The procedure, risks, benefits, and alternatives were explained to the patient. Questions regarding the procedure were encouraged and answered. The patient understands and consents to the procedure. The right neck and chest were prepped with chlorhexidine in a sterile fashion, and a sterile drape was applied covering the operative field. Maximum barrier sterile technique with sterile gowns and gloves were used for the procedure. A timeout was performed prior to the initiation of the procedure. Local anesthesia was provided with 1% lidocaine with epinephrine. After creating a small venotomy incision, a micropuncture kit was utilized to access the internal jugular vein. Real-time ultrasound guidance was utilized for vascular access including the acquisition of a permanent ultrasound image documenting patency of the accessed vessel. The microwire was utilized to measure appropriate catheter length. A subcutaneous port pocket was then created along the upper chest wall utilizing a combination of sharp and blunt dissection. The pocket was irrigated with sterile saline. A single lumen thin power injectable port was chosen for placement. The 8 Fr catheter was tunneled from the port pocket site to the venotomy incision. The port was placed in the pocket. The external catheter was trimmed to appropriate length. At the venotomy, an 8 Fr peel-away sheath was placed over a guidewire  under fluoroscopic guidance. The catheter was then placed through the sheath and the sheath was removed. Final catheter positioning was confirmed and documented with a fluoroscopic spot radiograph. The port was accessed with a Huber needle, aspirated and flushed with heparinized saline. The venotomy site was closed with an interrupted 4-0 Vicryl suture. The port pocket incision was closed with interrupted 2-0 Vicryl suture and the skin was opposed with a running subcuticular 4-0 Vicryl suture. Dermabond and Steri-strips were applied to both incisions. Dressings were placed. The patient tolerated the procedure well without immediate post procedural complication. FINDINGS: After catheter placement, the tip lies within the superior cavoatrial junction. The catheter aspirates and flushes normally and is ready for immediate use. IMPRESSION: Successful placement of a right internal jugular approach power injectable Port-A-Cath. The catheter is ready for immediate use. Electronically Signed   By: Sandi Mariscal M.D.   On: 06/30/2018 13:22

## 2018-07-17 ENCOUNTER — Encounter: Payer: Self-pay | Admitting: Hematology

## 2018-07-17 ENCOUNTER — Encounter: Payer: Self-pay | Admitting: *Deleted

## 2018-07-17 ENCOUNTER — Ambulatory Visit
Admission: RE | Admit: 2018-07-17 | Discharge: 2018-07-17 | Disposition: A | Discharge status: Home / Self Care | Payer: Commercial Managed Care - PPO | Source: Ambulatory Visit | Attending: Radiation Oncology | Admitting: Radiation Oncology

## 2018-07-17 DIAGNOSIS — B370 Candidal stomatitis: Secondary | ICD-10-CM | POA: Insufficient documentation

## 2018-07-17 DIAGNOSIS — C099 Malignant neoplasm of tonsil, unspecified: Secondary | ICD-10-CM | POA: Diagnosis not present

## 2018-07-17 DIAGNOSIS — K521 Toxic gastroenteritis and colitis: Secondary | ICD-10-CM | POA: Insufficient documentation

## 2018-07-17 DIAGNOSIS — Z5111 Encounter for antineoplastic chemotherapy: Secondary | ICD-10-CM | POA: Diagnosis not present

## 2018-07-17 DIAGNOSIS — R110 Nausea: Secondary | ICD-10-CM | POA: Insufficient documentation

## 2018-07-17 DIAGNOSIS — T451X5A Adverse effect of antineoplastic and immunosuppressive drugs, initial encounter: Secondary | ICD-10-CM | POA: Insufficient documentation

## 2018-07-18 ENCOUNTER — Inpatient Hospital Stay: Payer: Commercial Managed Care - PPO | Admitting: Nutrition

## 2018-07-18 ENCOUNTER — Ambulatory Visit
Admission: RE | Admit: 2018-07-18 | Discharge: 2018-07-18 | Disposition: A | Discharge status: Home / Self Care | Payer: Commercial Managed Care - PPO | Source: Ambulatory Visit | Attending: Radiation Oncology | Admitting: Radiation Oncology

## 2018-07-18 ENCOUNTER — Inpatient Hospital Stay: Payer: Commercial Managed Care - PPO

## 2018-07-18 ENCOUNTER — Encounter: Payer: Self-pay | Admitting: Nutrition

## 2018-07-18 VITALS — BP 119/80 | HR 90 | Temp 98.2°F | Resp 18

## 2018-07-18 DIAGNOSIS — C099 Malignant neoplasm of tonsil, unspecified: Secondary | ICD-10-CM

## 2018-07-18 DIAGNOSIS — Z5111 Encounter for antineoplastic chemotherapy: Secondary | ICD-10-CM | POA: Diagnosis not present

## 2018-07-18 MED ORDER — POTASSIUM CHLORIDE 2 MEQ/ML IV SOLN
Freq: Once | INTRAVENOUS | Status: AC
  Administered 2018-07-18: 10:00:00 via INTRAVENOUS
  Filled 2018-07-18: qty 10

## 2018-07-18 MED ORDER — PALONOSETRON HCL INJECTION 0.25 MG/5ML
0.2500 mg | Freq: Once | INTRAVENOUS | Status: AC
  Administered 2018-07-18: 0.25 mg via INTRAVENOUS

## 2018-07-18 MED ORDER — PALONOSETRON HCL INJECTION 0.25 MG/5ML
INTRAVENOUS | Status: AC
  Filled 2018-07-18: qty 5

## 2018-07-18 MED ORDER — HEPARIN SOD (PORK) LOCK FLUSH 100 UNIT/ML IV SOLN
500.0000 [IU] | Freq: Once | INTRAVENOUS | Status: AC | PRN
  Administered 2018-07-18: 500 [IU]
  Filled 2018-07-18: qty 5

## 2018-07-18 MED ORDER — SODIUM CHLORIDE 0.9% FLUSH
10.0000 mL | INTRAVENOUS | Status: DC | PRN
  Administered 2018-07-18: 10 mL
  Filled 2018-07-18: qty 10

## 2018-07-18 MED ORDER — SODIUM CHLORIDE 0.9 % IV SOLN
Freq: Once | INTRAVENOUS | Status: AC
  Administered 2018-07-18: 12:00:00 via INTRAVENOUS
  Filled 2018-07-18: qty 5

## 2018-07-18 MED ORDER — SODIUM CHLORIDE 0.9 % IV SOLN
Freq: Once | INTRAVENOUS | Status: AC
  Administered 2018-07-18: 09:00:00 via INTRAVENOUS
  Filled 2018-07-18: qty 250

## 2018-07-18 MED ORDER — SODIUM CHLORIDE 0.9 % IV SOLN
40.0000 mg/m2 | Freq: Once | INTRAVENOUS | Status: AC
  Administered 2018-07-18: 79 mg via INTRAVENOUS
  Filled 2018-07-18: qty 79

## 2018-07-18 NOTE — Progress Notes (Signed)
Nutrition follow-up completed with patient during infusion for left base of tonsil cancer. Last weight documented as 181 pounds on February 28. Patient reports he is experiencing taste alterations. He is keeping a food journal and noted weight has been trending down this past week. He appears to be eating soft foods and soups at mealtimes. He has been drinking 1 Ensure Enlive a day. Reports regular bowel movement this morning.  Estimated nutrition needs: 2200-2400 cal, 110-125 g protein, 2.4 L fluid.  Nutrition diagnosis: Predicted suboptimal energy intake has evolved into inadequate oral intake related to tongue cancer as evidenced by 2 pound weight loss.  Intervention: Educated patient to continue soft foods and provided examples of high-calorie high-protein foods to add to his diet. Recommended he continue food journal. Educated patient to increase Ensure Enlive 3 times daily between meals. Educated patient on strategies for improving taste alterations. Recommended bowel regimen.  Monitoring, evaluation, goals: Patient will tolerate increased calories and protein to provide weight stability.  Next visit: Thursday, March 5 during infusion.  **Disclaimer: This note was dictated with voice recognition software. Similar sounding words can inadvertently be transcribed and this note may contain transcription errors which may not have been corrected upon publication of note.**

## 2018-07-18 NOTE — Patient Instructions (Signed)
[facility] Discharge Instructions for Patients Receiving Chemotherapy  Today you received the following chemotherapy agents: Cisplatin  To help prevent nausea and vomiting after your treatment, we encourage you to take your nausea medication  as prescribed.    If you develop nausea and vomiting that is not controlled by your nausea medication, call the clinic.   BELOW ARE SYMPTOMS THAT SHOULD BE REPORTED IMMEDIATELY:  *FEVER GREATER THAN 100.5 F  *CHILLS WITH OR WITHOUT FEVER  NAUSEA AND VOMITING THAT IS NOT CONTROLLED WITH YOUR NAUSEA MEDICATION  *UNUSUAL SHORTNESS OF BREATH  *UNUSUAL BRUISING OR BLEEDING  TENDERNESS IN MOUTH AND THROAT WITH OR WITHOUT PRESENCE OF ULCERS  *URINARY PROBLEMS  *BOWEL PROBLEMS  UNUSUAL RASH Items with * indicate a potential emergency and should be followed up as soon as possible.  Feel free to call the clinic should you have any questions or concerns. The clinic phone number is [phone].  Please show the CHEMO ALERT CARD at check-in to the Emergency Department and triage nurse.   

## 2018-07-21 ENCOUNTER — Ambulatory Visit
Admission: RE | Admit: 2018-07-21 | Discharge: 2018-07-21 | Disposition: A | Discharge status: Home / Self Care | Payer: Commercial Managed Care - PPO | Source: Ambulatory Visit | Attending: Radiation Oncology | Admitting: Radiation Oncology

## 2018-07-21 ENCOUNTER — Ambulatory Visit: Payer: Commercial Managed Care - PPO

## 2018-07-21 DIAGNOSIS — C099 Malignant neoplasm of tonsil, unspecified: Secondary | ICD-10-CM

## 2018-07-21 DIAGNOSIS — Z5111 Encounter for antineoplastic chemotherapy: Secondary | ICD-10-CM | POA: Diagnosis not present

## 2018-07-21 DIAGNOSIS — R066 Hiccough: Secondary | ICD-10-CM | POA: Diagnosis not present

## 2018-07-21 DIAGNOSIS — K1231 Oral mucositis (ulcerative) due to antineoplastic therapy: Secondary | ICD-10-CM | POA: Diagnosis not present

## 2018-07-21 DIAGNOSIS — K5903 Drug induced constipation: Secondary | ICD-10-CM | POA: Diagnosis not present

## 2018-07-21 DIAGNOSIS — N179 Acute kidney failure, unspecified: Secondary | ICD-10-CM | POA: Diagnosis not present

## 2018-07-21 DIAGNOSIS — Z79899 Other long term (current) drug therapy: Secondary | ICD-10-CM | POA: Diagnosis not present

## 2018-07-21 DIAGNOSIS — H9319 Tinnitus, unspecified ear: Secondary | ICD-10-CM | POA: Diagnosis not present

## 2018-07-21 DIAGNOSIS — H903 Sensorineural hearing loss, bilateral: Secondary | ICD-10-CM | POA: Diagnosis not present

## 2018-07-21 DIAGNOSIS — T402X5S Adverse effect of other opioids, sequela: Secondary | ICD-10-CM | POA: Diagnosis not present

## 2018-07-21 DIAGNOSIS — R110 Nausea: Secondary | ICD-10-CM | POA: Diagnosis not present

## 2018-07-21 DIAGNOSIS — G2581 Restless legs syndrome: Secondary | ICD-10-CM | POA: Diagnosis not present

## 2018-07-21 DIAGNOSIS — C01 Malignant neoplasm of base of tongue: Secondary | ICD-10-CM | POA: Diagnosis present

## 2018-07-21 DIAGNOSIS — E46 Unspecified protein-calorie malnutrition: Secondary | ICD-10-CM | POA: Diagnosis not present

## 2018-07-21 DIAGNOSIS — Z931 Gastrostomy status: Secondary | ICD-10-CM | POA: Diagnosis not present

## 2018-07-21 DIAGNOSIS — T451X5S Adverse effect of antineoplastic and immunosuppressive drugs, sequela: Secondary | ICD-10-CM | POA: Diagnosis not present

## 2018-07-22 ENCOUNTER — Ambulatory Visit
Admission: RE | Admit: 2018-07-22 | Discharge: 2018-07-22 | Disposition: A | Discharge status: Home / Self Care | Payer: Commercial Managed Care - PPO | Source: Ambulatory Visit | Attending: Radiation Oncology | Admitting: Radiation Oncology

## 2018-07-22 DIAGNOSIS — C099 Malignant neoplasm of tonsil, unspecified: Secondary | ICD-10-CM | POA: Diagnosis not present

## 2018-07-23 ENCOUNTER — Inpatient Hospital Stay: Payer: Commercial Managed Care - PPO

## 2018-07-23 ENCOUNTER — Telehealth: Payer: Self-pay | Admitting: *Deleted

## 2018-07-23 ENCOUNTER — Telehealth: Payer: Self-pay | Admitting: Hematology

## 2018-07-23 ENCOUNTER — Inpatient Hospital Stay: Payer: Commercial Managed Care - PPO | Attending: Hematology | Admitting: Hematology

## 2018-07-23 ENCOUNTER — Encounter: Payer: Self-pay | Admitting: Hematology

## 2018-07-23 ENCOUNTER — Ambulatory Visit
Admission: RE | Admit: 2018-07-23 | Discharge: 2018-07-23 | Disposition: A | Discharge status: Home / Self Care | Payer: Commercial Managed Care - PPO | Source: Ambulatory Visit | Attending: Radiation Oncology | Admitting: Radiation Oncology

## 2018-07-23 VITALS — BP 131/93 | HR 89 | Temp 97.6°F | Resp 19 | Ht 68.0 in | Wt 181.5 lb

## 2018-07-23 DIAGNOSIS — Z5111 Encounter for antineoplastic chemotherapy: Secondary | ICD-10-CM | POA: Insufficient documentation

## 2018-07-23 DIAGNOSIS — C099 Malignant neoplasm of tonsil, unspecified: Secondary | ICD-10-CM | POA: Insufficient documentation

## 2018-07-23 DIAGNOSIS — G2581 Restless legs syndrome: Secondary | ICD-10-CM | POA: Insufficient documentation

## 2018-07-23 DIAGNOSIS — Z79899 Other long term (current) drug therapy: Secondary | ICD-10-CM | POA: Insufficient documentation

## 2018-07-23 DIAGNOSIS — R066 Hiccough: Secondary | ICD-10-CM | POA: Insufficient documentation

## 2018-07-23 DIAGNOSIS — T451X5S Adverse effect of antineoplastic and immunosuppressive drugs, sequela: Secondary | ICD-10-CM | POA: Insufficient documentation

## 2018-07-23 DIAGNOSIS — Z931 Gastrostomy status: Secondary | ICD-10-CM | POA: Diagnosis not present

## 2018-07-23 DIAGNOSIS — N179 Acute kidney failure, unspecified: Secondary | ICD-10-CM | POA: Insufficient documentation

## 2018-07-23 DIAGNOSIS — T402X5S Adverse effect of other opioids, sequela: Secondary | ICD-10-CM | POA: Insufficient documentation

## 2018-07-23 DIAGNOSIS — E46 Unspecified protein-calorie malnutrition: Secondary | ICD-10-CM | POA: Insufficient documentation

## 2018-07-23 DIAGNOSIS — H9319 Tinnitus, unspecified ear: Secondary | ICD-10-CM

## 2018-07-23 DIAGNOSIS — K5903 Drug induced constipation: Secondary | ICD-10-CM | POA: Insufficient documentation

## 2018-07-23 DIAGNOSIS — K1231 Oral mucositis (ulcerative) due to antineoplastic therapy: Secondary | ICD-10-CM | POA: Insufficient documentation

## 2018-07-23 DIAGNOSIS — H903 Sensorineural hearing loss, bilateral: Secondary | ICD-10-CM

## 2018-07-23 DIAGNOSIS — T451X5A Adverse effect of antineoplastic and immunosuppressive drugs, initial encounter: Secondary | ICD-10-CM

## 2018-07-23 DIAGNOSIS — C01 Malignant neoplasm of base of tongue: Secondary | ICD-10-CM | POA: Insufficient documentation

## 2018-07-23 DIAGNOSIS — Z95828 Presence of other vascular implants and grafts: Secondary | ICD-10-CM

## 2018-07-23 DIAGNOSIS — R110 Nausea: Secondary | ICD-10-CM | POA: Insufficient documentation

## 2018-07-23 LAB — COMPREHENSIVE METABOLIC PANEL
ALT: 32 U/L (ref 0–44)
AST: 15 U/L (ref 15–41)
Albumin: 3.9 g/dL (ref 3.5–5.0)
Alkaline Phosphatase: 76 U/L (ref 38–126)
Anion gap: 5 (ref 5–15)
BUN: 24 mg/dL — AB (ref 6–20)
CO2: 30 mmol/L (ref 22–32)
Calcium: 8.8 mg/dL — ABNORMAL LOW (ref 8.9–10.3)
Chloride: 100 mmol/L (ref 98–111)
Creatinine, Ser: 1.11 mg/dL (ref 0.61–1.24)
GFR calc non Af Amer: >60 mL/min (ref >60)
Glucose, Bld: 156 mg/dL — ABNORMAL HIGH (ref 70–99)
Potassium: 3.8 mmol/L (ref 3.5–5.1)
Sodium: 135 mmol/L (ref 135–145)
Total Bilirubin: 0.3 mg/dL (ref 0.3–1.2)
Total Protein: 6.4 g/dL — ABNORMAL LOW (ref 6.5–8.1)

## 2018-07-23 LAB — CBC WITH DIFFERENTIAL (CANCER CENTER ONLY)
Abs Immature Granulocytes: 0.08 10*3/uL — ABNORMAL HIGH (ref 0.00–0.07)
Basophils Absolute: 0 10*3/uL (ref 0.0–0.1)
Basophils Relative: 0 %
Eosinophils Absolute: 0.1 10*3/uL (ref 0.0–0.5)
Eosinophils Relative: 1 %
HCT: 43.6 % (ref 39.0–52.0)
Hemoglobin: 14.5 g/dL (ref 13.0–17.0)
Immature Granulocytes: 1 %
Lymphocytes Relative: 5 %
Lymphs Abs: 0.6 10*3/uL — ABNORMAL LOW (ref 0.7–4.0)
MCH: 31.1 pg (ref 26.0–34.0)
MCHC: 33.3 g/dL (ref 30.0–36.0)
MCV: 93.6 fL (ref 80.0–100.0)
MONO ABS: 1.1 10*3/uL — AB (ref 0.1–1.0)
Monocytes Relative: 11 %
Neutro Abs: 8.4 10*3/uL — ABNORMAL HIGH (ref 1.7–7.7)
Neutrophils Relative %: 82 %
Platelet Count: 173 10*3/uL (ref 150–400)
RBC: 4.66 MIL/uL (ref 4.22–5.81)
RDW: 13.2 % (ref 11.5–15.5)
WBC Count: 10.2 10*3/uL (ref 4.0–10.5)
nRBC: 0 % (ref 0.0–0.2)

## 2018-07-23 LAB — MAGNESIUM: Magnesium: 2 mg/dL (ref 1.7–2.4)

## 2018-07-23 MED ORDER — HEPARIN SOD (PORK) LOCK FLUSH 100 UNIT/ML IV SOLN
500.0000 [IU] | Freq: Once | INTRAVENOUS | Status: AC | PRN
  Administered 2018-07-23: 500 [IU]
  Filled 2018-07-23: qty 5

## 2018-07-23 MED ORDER — MORPHINE SULFATE 15 MG PO TABS: 15.0000 mg | ORAL_TABLET | ORAL | 0 refills | Status: DC | PRN

## 2018-07-23 MED ORDER — SODIUM CHLORIDE 0.9% FLUSH
10.0000 mL | INTRAVENOUS | Status: DC | PRN
  Administered 2018-07-23: 10 mL
  Filled 2018-07-23: qty 10

## 2018-07-23 NOTE — Telephone Encounter (Signed)
Oncology Nurse Navigator Documentation  In follow-up to patient's appt today and Dr. Lorette Ang request for audiology evaluation, sent fax to Dorothey Baseman, [facility], requesting patient be contacted to schedule audiology before 07/29/2018, requested call/email when appt scheduled.  'Successful TX Notice' received.  [name], RN, BSN Head & Neck Oncology Nurse [place] [facility] [place] [phone]

## 2018-07-23 NOTE — Telephone Encounter (Signed)
-----   Message from Tish Men, MD sent at 07/23/2018  2:38 PM EST ----- Hi Thu,   Can you let Mr. Heider to know that he needs to increase his fluid intake and maintain adequate hydration? His Cr is still normal, but has gone up by 0.3 since last week.  Thanks.  [place]

## 2018-07-23 NOTE — Progress Notes (Signed)
Per Dr. Maylon Peppers,  [place] to treat on 07/24/2018 with Crea 1.11.

## 2018-07-23 NOTE — Telephone Encounter (Signed)
Per 3/4 los as scheduled.  Printed avs.

## 2018-07-23 NOTE — Progress Notes (Signed)
[place] OFFICE PROGRESS NOTE  Patient Care Team: [name], MD as PCP - General (Family Medicine) Melissa Montane, MD as Consulting Physician (Otolaryngology) Eppie Gibson, MD as Attending Physician (Radiation Oncology) Tish Men, MD as Consulting Physician (Hematology) Leota Sauers, RN as Oncology Nurse Navigator [place], [place], [place] as Speech Language Pathologist (Speech Pathology) Karie Mainland, RD as Dietitian (Nutrition) Wynelle Beckmann, Melodie Bouillon, PT as Physical Therapist (Physical Therapy) Kennith Center, LCSW as Social Worker Francina Ames, MD as Referring Physician (Otolaryngology)  HEME/ONC OVERVIEW: 1. Stage I (pT1pN1M0) left tonsil SCCa, p16+ -02/2018: CT neck showed enlarged left Level II and II LN with central necrosis (largest 2.4cm); effacement of the left IJ vein noted  -03/2018: US-guided bx of left cervical LN; path showed SCCa, p16+ -04/2018: PET showed FDG-avid left palatine tonsil lesion measuring ~1.3cm with associated FDG-avid left Level IIa LN's; no evidence of metastatic disease -05/2018: TORS by Dr. Raphael Gibney at [facility]; path showed HPV-positive SCCa of the left glossotonsillar sulcus, 1.6cm, focal margin positive, PNI+, 1/6 LN's positive (largest 6.8cm) with ECE; pT1pN1  -06/2018 - present: adjuvant chemoradiation with weekly cisplatin  2. Port and PEG placement in 06/2018   TREATMENT SUMMARY:  05/26/2018: TORS by Dr. Nicolette Bang at [facility]   07/07/2018 - present: adjuvant chemoradiation with weekly cisplatin 73m/m2 (due to moderate hearing loss)  ASSESSMENT & PLAN:   Stage I (cT1N1M0) left tonsil SCCa, p16+ -S/p 2 cycles of weekly cisplatin -Labs adequate today, proceed with Cycle 3 of weekly cisplatin on 07/24/2018  -See symptom management below -PRN antiemetics: Zofran, Ativan, and dexamethasone; no Compazine due to worsening RLS  Chemotherapy-associated tinnitus -Secondary to cisplatin -Patient reports 3 to 4  days of new onset, mild persistent tinnitus bilaterally; not interfering with hearing; Grade 1 -While ototoxicity tends to occur later during treatment, he has baseline hearing loss audiology testing prior to starting treatment and is therefore at increased risk for developing ototoxicity -Given that the tinnitus appears to be mild, we will proceed with Cycle 3 of weekly cisplatin as scheduled on 07/24/2018 -I have ordered repeat audiology exam to assess for any interim hearing change; appointment to be scheduled by oncology navigator -I counseled patient on any concerning symptoms, such as worsening tinnitus interfering with instrumental ADLs, pain, or other hearing changes, for which he should contact the clinic promptly  Chemotherapy-associated AKI -Secondary to cisplatin -Cr 1.1 today, up from 0.8 one week ago -I encouraged the patient to increase his fluid intake and to maintain adequate hydration -We will monitor for now; no indication for dose adjustment  Chemotherapy-associated nausea -Secondary to cisplatin -Overall relatively well controlled with Zofran and Ativan; dexamethasone dose reduced to 4 mg BID x2 days after each chemotherapy treatment -No Compazine due to RLS exacerbation -Continue PRN antiemetics  Chemotherapy-associated mucositis -On fluconazole since 07/14/2022 oral candidiasis; no significant white plaques in oropharynx on exam today -Patient has been using lidocaine swish and swallow prior to eating -I have prescribed IR morphine 15 mg q4hrs PRN for mucositis pain   Hiccups -Possibly due to steroid after each chemotherapy -No improvement with Flexeril -Dexamethasone dose reduce as outlined above  Orders Placed This Encounter  Procedures  . Comprehensive metabolic panel  . Magnesium   All questions were answered. The patient knows to call the clinic with any problems, questions or concerns. No barriers to learning was detected.  Return in 1 week for labs, port  flush, and clinic appointment prior to Cycle 4 of  weekly cisplatin.  Tish Men, MD 07/23/2018 2:28 PM  CHIEF COMPLAINT: "I am having some ringing in my ears"  INTERVAL HISTORY: Ms. Peddie returns to clinic for follow-up of left tonsil squamous cell carcinoma on adjuvant chemoradiation.  Patient reports that starting on Monday this week, he noticed a new in onset ringing in both ears, right greater than left, and lasting 3 minutes.  Since then, he has had mild lingering ringing in both ears, not interfering with hearing or ADLs.  He tried muscle relaxant for the hiccups, which did not help, but since reducing the dose of dexamethasone to 4 mg BID, the hiccups have improved with.  He takes Zofran and Ativan as needed, which helps control the nausea.  He does not take Compazine due to RLS exacerbation.  He takes viscous lidocaine for mucositis pain, which helps modestly for eating, but he does not have any other pain medication for mucositis pain.  On fluconazole for oral candidiasis.  He denies any other complaint today.  SUMMARY OF ONCOLOGIC HISTORY:   Squamous cell carcinoma of left tonsil ([place])   03/20/2018 Procedure    FNA of the left cervical LN in office    03/20/2018 Pathology Results    Non-diagnostic    04/07/2018 Procedure    US-guided left cervical LN biopsy/FNA     04/07/2018 Pathology Results    (Accession: [id number]) Lymph node, needle/core biopsy, left cervical - METASTATIC SQUAMOUS CELL CARCINOMA INVOLVING A LYMPH NODE (1/1) - SEE COMMENT  p16 IHC positive. EBV ISH negative.     04/22/2018 Pathology Results    (Accession: [id number]) Tonsil, biopsy, left, base of tongue - INVASIVE KERATINIZING SQUAMOUS CELL CARCINOMA.    04/29/2018 Imaging    PET:  IMPRESSION: 1. Left palatine tonsil/adjacent pharyngeal mucosal space lesion is asymmetric, measures about 1.3 cm in long axis, and has a maximum SUV of 8.2, compatible with malignancy. There is associated hypermetabolic  left level IIa adenopathy as shown on prior exams. 2. Symmetric accentuated activity in the lymphoid tissue at the tongue base, maximum SUV 5.3, quite likely physiologic given the symmetry.  3. No findings of metastatic disease Pred to the chest, abdomen/pelvis, or skeleton.    05/26/2018 Surgery    Radical tonsillectomy    05/26/2018 Pathology Results    PROCEDURE: Radical tonsillectomy TUMOR SITE: Glossotonsillar sulcus TUMOR LATERALITY: LEFT TUMOR FOCALITY: Unifocal TUMOR SIZE: GREATEST DIMENSION: 1.6 cm HISTOLOGIC TYPE: HPV-mediated squamous cell carcinoma MARGINS: Involved by invasive tumor (focal) Specify location of closest margin: Tongue base (soft tissue) Note: Although an additional tongue base mucosal margin was submitted, location of tumor extension to margin appears 1 cm from the mucosal surface. LYMPHOVASCULAR INVASION: Not identified PERINEURAL INVASION: Present REGIONAL LYMPH NODES:  NUMBER OF LYMPH NODES INVOLVED: 1  NUMBER OF LYMPH NODES EXAMINED: 6  LATERALITY OF LYMPH NODES INVOLVED: Ipsilateral  SIZE OF LARGEST METASTATIC DEPOSIT: 6.8 cm  EXTRANODAL EXTENSION: Present   Distance from lymph node capsule: 6 millimeters PATHOLOGIC STAGE CLASSIFICATION (pTNM, AJCC 8TH Ed): pT1 pN1 (HPV-mediated oropharynx tumor)  pT1: Tumor 2 cm or smaller in greatest dimension  pN1: Metastasis in 4 or fewer lymph nodes ANCILLARY STUDIES:  p16 expression by immunohistochemistry: Positive  HPV by PCR: pending    05/26/2018 Cancer Staging    Staging form: Pharynx - HPV-Mediated Oropharynx, AJCC 8th Edition - Pathologic stage from 05/26/2018: Stage I (pT1, pN1, cM0, p16+) - Signed by Tish Men, MD on 06/18/2018    07/10/2018 - 07/10/2018 Chemotherapy    The  patient had palonosetron (ALOXI) injection 0.25 mg, 0.25 mg, Intravenous,  Once, 0 of 3 cycles CISplatin (PLATINOL) 198 mg in sodium chloride 0.9 % 500 mL chemo infusion, 100 mg/m2 = 198 mg,  Intravenous,  Once, 0 of 3 cycles fosaprepitant (EMEND) 150 mg, dexamethasone (DECADRON) 12 mg in sodium chloride 0.9 % 145 mL IVPB, , Intravenous,  Once, 0 of 3 cycles  for chemotherapy treatment.     07/10/2018 -  Chemotherapy    The patient had palonosetron (ALOXI) injection 0.25 mg, 0.25 mg, Intravenous,  Once, 2 of 7 cycles Administration: 0.25 mg (07/10/2018), 0.25 mg (07/18/2018) CISplatin (PLATINOL) 79 mg in sodium chloride 0.9 % 250 mL chemo infusion, 40 mg/m2 = 79 mg, Intravenous,  Once, 2 of 7 cycles Administration: 79 mg (07/10/2018), 79 mg (07/18/2018) fosaprepitant (EMEND) 150 mg, dexamethasone (DECADRON) 12 mg in sodium chloride 0.9 % 145 mL IVPB, , Intravenous,  Once, 2 of 7 cycles Administration:  (07/10/2018),  (07/18/2018)  for chemotherapy treatment.      Malignant neoplasm of base of tongue ([place])   05/09/2018 Initial Diagnosis    Malignant neoplasm of base of tongue ([place])    05/09/2018 Cancer Staging    Staging form: Pharynx - HPV-Mediated Oropharynx, AJCC 8th Edition - Clinical: Stage I (cT1, cN1, cM0, p16+) - Signed by Eppie Gibson, MD on 05/09/2018    06/11/2018 Cancer Staging    Staging form: Pharynx - HPV-Mediated Oropharynx, AJCC 8th Edition - Pathologic: Stage I (pT1, pN1, cM0, p16+) - Signed by Eppie Gibson, MD on 06/11/2018     REVIEW OF SYSTEMS:   Constitutional: ( - ) fevers, ( - )  chills , ( - ) night sweats Eyes: ( - ) blurriness of vision, ( - ) double vision, ( - ) watery eyes Ears, nose, mouth, throat, and face: ( - ) mucositis, ( - ) sore throat Respiratory: ( - ) cough, ( - ) dyspnea, ( - ) wheezes Cardiovascular: ( - ) palpitation, ( - ) chest discomfort, ( - ) lower extremity swelling Gastrointestinal:  ( + ) nausea, ( - ) heartburn, ( - ) change in bowel habits Skin: ( - ) abnormal skin rashes Lymphatics: ( - ) new lymphadenopathy, ( - ) easy bruising Neurological: ( - ) numbness, ( - ) tingling, ( - ) new weaknesses Behavioral/Psych: ( - )  mood change, ( - ) new changes  All other systems were reviewed with the patient and are negative.  I have reviewed the past medical history, past surgical history, social history and family history with the patient and they are unchanged from previous note.  ALLERGIES:  has No Known Allergies.  MEDICATIONS:  Current Outpatient Medications  Medication Sig Dispense Refill  . cyclobenzaprine (FLEXERIL) 10 MG tablet Take 1 tablet (10 mg total) by mouth 3 (three) times daily as needed for up to 30 days (for hiccups). 60 tablet 2  . famotidine (PEPCID) 20 MG tablet Take 20 mg by mouth daily as needed for heartburn or indigestion.    . fluconazole (DIFLUCAN) 100 MG tablet Take 2 tablets today, then 1 tablet daily x 20 more days. 22 tablet 0  . lidocaine-prilocaine (EMLA) cream Apply to affected area once 30 g 3  . LORazepam (ATIVAN) 0.5 MG tablet Take 1 tablet (0.5 mg total) by mouth every 6 (six) hours as needed (Nausea or vomiting). 30 tablet 0  . Magnesium 500 MG TABS Take 500 mg by mouth daily.    . ondansetron (ZOFRAN)  8 MG tablet Take 1 tablet (8 mg total) by mouth 2 (two) times daily as needed. Start on the third day after chemotherapy. 30 tablet 1  . pramipexole (MIRAPEX) 0.5 MG tablet     . sodium fluoride (PREVIDENT 5000 PLUS) 1.1 % CREA dental cream Apply to tooth brush. Brush teeth for 2 minutes. Spit out excess. DO NOT rinse afterwards. Repeat nightly. 1 Tube prn  . Cholecalciferol (VITAMIN D3) 50 MCG (2000 UT) TABS Take 2,000 Units by mouth daily.    Marland Kitchen dexamethasone (DECADRON) 4 MG tablet Take 2 tablets by mouth once a day on the day after chemotherapy and then take 2 tablets two times a day for 2 days. Take with food. (Patient not taking: Reported on 07/23/2018) 30 tablet 1  . GLUCOSAMINE-CHONDROITIN PO Take 1 tablet by mouth daily.    Marland Kitchen lidocaine (XYLOCAINE) 2 % solution Patient: Mix 1part 2% viscous lidocaine, 1part H20. Swish & swallow 53m of diluted mixture, 330m before meals and  at bedtime, up to QID (Patient not taking: Reported on 07/23/2018) 100 mL 5  . morphine (MSIR) 15 MG tablet Take 1 tablet (15 mg total) by mouth every 4 (four) hours as needed for severe pain. 40 tablet 0  . oxyCODONE (OXY IR/ROXICODONE) 5 MG immediate release tablet PLEASE SEE ATTACHED FOR DETAILED DIRECTIONS    . phenazopyridine (PYRIDIUM) 200 MG tablet Take 1 tablet (200 mg total) by mouth 3 (three) times daily as needed for pain. (Patient not taking: Reported on 07/23/2018) 10 tablet 0  . prochlorperazine (COMPAZINE) 10 MG tablet Take 1 tablet (10 mg total) by mouth every 6 (six) hours as needed (Nausea or vomiting). (Patient not taking: Reported on 07/23/2018) 30 tablet 1   No current facility-administered medications for this visit.     PHYSICAL EXAMINATION: ECOG PERFORMANCE STATUS: 0 - Asymptomatic  Today's Vitals   07/23/18 1219 07/23/18 1226  BP: (!) 131/93   Pulse: 89   Resp: 19   Temp: 97.6 F (36.4 C)   TempSrc: Oral   SpO2: 98%   Weight: 181 lb 8 oz (82.3 kg)   Height: '5\' 8"'$  (1.727 m)   PainSc:  0-No pain   Body mass index is 27.6 kg/m.  Filed Weights   07/23/18 1219  Weight: 181 lb 8 oz (82.3 kg)    GENERAL: alert, no distress and comfortable, well appearing  SKIN: skin color, texture, turgor are normal, no rashes or significant lesions EYES: conjunctiva are pink and non-injected, sclera clear OROPHARYNX: no exudate, no erythema; lips, buccal mucosa, and tongue normal  NECK: supple, non-tender LYMPH:  no palpable lymphadenopathy in the cervical LUNGS: clear to auscultation with normal breathing effort HEART: regular rate & rhythm and no murmurs and no lower extremity edema ABDOMEN: soft, non-tender, non-distended, normal bowel sounds Musculoskeletal: no cyanosis of digits and no clubbing  PSYCH: alert & oriented x 3, fluent speech NEURO: no focal motor/sensory deficits  LABORATORY DATA:  I have reviewed the data as listed    Component Value Date/Time   NA 135  07/23/2018 1200   K 3.8 07/23/2018 1200   CL 100 07/23/2018 1200   CO2 30 07/23/2018 1200   GLUCOSE 156 (H) 07/23/2018 1200   BUN 24 (H) 07/23/2018 1200   CREATININE 1.11 07/23/2018 1200   CREATININE 0.84 07/16/2018 1414   CALCIUM 8.8 (L) 07/23/2018 1200   PROT 6.4 (L) 07/23/2018 1200   ALBUMIN 3.9 07/23/2018 1200   AST 15 07/23/2018 1200   AST 17  06/18/2018 1105   ALT 32 07/23/2018 1200   ALT 38 06/18/2018 1105   ALKPHOS 76 07/23/2018 1200   BILITOT 0.3 07/23/2018 1200   BILITOT 0.4 06/18/2018 1105   GFRNONAA >60 07/23/2018 1200   GFRNONAA >60 07/16/2018 1414   GFRAA >60 07/23/2018 1200   GFRAA >60 07/16/2018 1414    No results found for: SPEP, UPEP  Lab Results  Component Value Date   WBC 10.2 07/23/2018   NEUTROABS 8.4 (H) 07/23/2018   HGB 14.5 07/23/2018   HCT 43.6 07/23/2018   MCV 93.6 07/23/2018   PLT 173 07/23/2018      Chemistry      Component Value Date/Time   NA 135 07/23/2018 1200   K 3.8 07/23/2018 1200   CL 100 07/23/2018 1200   CO2 30 07/23/2018 1200   BUN 24 (H) 07/23/2018 1200   CREATININE 1.11 07/23/2018 1200   CREATININE 0.84 07/16/2018 1414      Component Value Date/Time   CALCIUM 8.8 (L) 07/23/2018 1200   ALKPHOS 76 07/23/2018 1200   AST 15 07/23/2018 1200   AST 17 06/18/2018 1105   ALT 32 07/23/2018 1200   ALT 38 06/18/2018 1105   BILITOT 0.3 07/23/2018 1200   BILITOT 0.4 06/18/2018 1105       RADIOGRAPHIC STUDIES: I have personally reviewed the radiological images as listed below and agreed with the findings in the report. Ir Gastrostomy Tube Mod Sed  Result Date: 06/30/2018 INDICATION: History of head neck cancer. Please place percutaneous gastrostomy tube for enteric nutrition supplementation purposes prior to initiation radiation chemoradiation. EXAM: PULL TROUGH GASTROSTOMY TUBE PLACEMENT COMPARISON:  PET-CT - 04/29/2018 MEDICATIONS: Ancef 2 gm IV; Antibiotics were administered within 1 hour of the procedure. Glucagon 1 mg IV  CONTRAST:  10 cc Isovue-300, administered into the gastric lumen. ANESTHESIA/SEDATION: Moderate (conscious) sedation was employed during this procedure. A total of Versed 1 mg was administered intravenously. Moderate Sedation Time: 16 minutes. The patient's level of consciousness and vital signs were monitored continuously by radiology nursing throughout the procedure under my direct supervision. FLUOROSCOPY TIME:  1 minute, 48 seconds (25 mGy) COMPLICATIONS: None immediate. PROCEDURE: Informed written consent was obtained from the patient following explanation of the procedure, risks, benefits and alternatives. A time out was performed prior to the initiation of the procedure. Ultrasound scanning was performed to demarcate the edge of the left lobe of the liver. Maximal barrier sterile technique utilized including caps, mask, sterile gowns, sterile gloves, large sterile drape, hand hygiene and Betadine prep. The left upper quadrant was sterilely prepped and draped. An oral gastric catheter was inserted into the stomach under fluoroscopy. The existing nasogastric feeding tube was removed. The left costal margin and air opacified transverse colon were identified and avoided. Air was injected into the stomach for insufflation and visualization under fluoroscopy. Under sterile conditions a 17 gauge trocar needle was utilized to access the stomach percutaneously beneath the left subcostal margin after the overlying soft tissues were anesthetized with 1% Lidocaine with epinephrine. Needle position was confirmed within the stomach with aspiration of air and injection of small amount of contrast. A single T tack was deployed for gastropexy. Over an Amplatz guide wire, a 9-French sheath was inserted into the stomach. A snare device was utilized to capture the oral gastric catheter. The snare device was pulled retrograde from the stomach up the esophagus and out the oropharynx. The 20-French pull-through gastrostomy was  connected to the snare device and pulled antegrade through the  oropharynx down the esophagus into the stomach and then through the percutaneous tract external to the patient. The gastrostomy was assembled externally. Contrast injection confirms position in the stomach. Several spot radiographic images were obtained in various obliquities for documentation. The patient tolerated procedure well without immediate post procedural complication. FINDINGS: After successful fluoroscopic guided placement, the gastrostomy tube is appropriately positioned with internal disc against the ventral aspect of the gastric lumen. IMPRESSION: Successful fluoroscopic insertion of a 20-French pull-through gastrostomy tube. The gastrostomy may be used immediately for medication administration and in 24 hrs for the initiation of feeds. Electronically Signed   By: Sandi Mariscal M.D.   On: 06/30/2018 13:26   Ir Imaging Guided Port Insertion  Result Date: 06/30/2018 INDICATION: History of head neck cancer. In need of durable intravenous access for chemotherapy administration. EXAM: IMPLANTED PORT A CATH PLACEMENT WITH ULTRASOUND AND FLUOROSCOPIC GUIDANCE COMPARISON:  PET-CT-04/29/2018 MEDICATIONS: Ancef 2 gm IV; The antibiotic was administered within an appropriate time interval prior to skin puncture. ANESTHESIA/SEDATION: Moderate (conscious) sedation was employed during this procedure. A total of Versed 3 mg and Fentanyl 100 mcg was administered intravenously. Moderate Sedation Time: 24 minutes. The patient's level of consciousness and vital signs were monitored continuously by radiology nursing throughout the procedure under my direct supervision. CONTRAST:  None FLUOROSCOPY TIME:  30 seconds (11 mGy) COMPLICATIONS: None immediate. PROCEDURE: The procedure, risks, benefits, and alternatives were explained to the patient. Questions regarding the procedure were encouraged and answered. The patient understands and consents to the procedure.  The right neck and chest were prepped with chlorhexidine in a sterile fashion, and a sterile drape was applied covering the operative field. Maximum barrier sterile technique with sterile gowns and gloves were used for the procedure. A timeout was performed prior to the initiation of the procedure. Local anesthesia was provided with 1% lidocaine with epinephrine. After creating a small venotomy incision, a micropuncture kit was utilized to access the internal jugular vein. Real-time ultrasound guidance was utilized for vascular access including the acquisition of a permanent ultrasound image documenting patency of the accessed vessel. The microwire was utilized to measure appropriate catheter length. A subcutaneous port pocket was then created along the upper chest wall utilizing a combination of sharp and blunt dissection. The pocket was irrigated with sterile saline. A single lumen thin power injectable port was chosen for placement. The 8 Fr catheter was tunneled from the port pocket site to the venotomy incision. The port was placed in the pocket. The external catheter was trimmed to appropriate length. At the venotomy, an 8 Fr peel-away sheath was placed over a guidewire under fluoroscopic guidance. The catheter was then placed through the sheath and the sheath was removed. Final catheter positioning was confirmed and documented with a fluoroscopic spot radiograph. The port was accessed with a Huber needle, aspirated and flushed with heparinized saline. The venotomy site was closed with an interrupted 4-0 Vicryl suture. The port pocket incision was closed with interrupted 2-0 Vicryl suture and the skin was opposed with a running subcuticular 4-0 Vicryl suture. Dermabond and Steri-strips were applied to both incisions. Dressings were placed. The patient tolerated the procedure well without immediate post procedural complication. FINDINGS: After catheter placement, the tip lies within the superior cavoatrial  junction. The catheter aspirates and flushes normally and is ready for immediate use. IMPRESSION: Successful placement of a right internal jugular approach power injectable Port-A-Cath. The catheter is ready for immediate use. Electronically Signed   By: Eldridge Abrahams.D.  On: 06/30/2018 13:22

## 2018-07-23 NOTE — Telephone Encounter (Signed)
Spoke with pt and informed pt of Crea level as per Dr. Maylon Peppers.  Reinforced that pt needed to push po fluid intake.  Pt voiced understanding.

## 2018-07-24 ENCOUNTER — Inpatient Hospital Stay: Payer: Commercial Managed Care - PPO | Admitting: Nutrition

## 2018-07-24 ENCOUNTER — Inpatient Hospital Stay: Payer: Commercial Managed Care - PPO

## 2018-07-24 ENCOUNTER — Telehealth: Payer: Self-pay | Admitting: *Deleted

## 2018-07-24 ENCOUNTER — Ambulatory Visit
Admission: RE | Admit: 2018-07-24 | Discharge: 2018-07-24 | Disposition: A | Discharge status: Home / Self Care | Payer: Commercial Managed Care - PPO | Source: Ambulatory Visit | Attending: Radiation Oncology | Admitting: Radiation Oncology

## 2018-07-24 VITALS — BP 138/54 | HR 98 | Temp 98.8°F | Resp 18

## 2018-07-24 DIAGNOSIS — C099 Malignant neoplasm of tonsil, unspecified: Secondary | ICD-10-CM | POA: Diagnosis not present

## 2018-07-24 MED ORDER — POTASSIUM CHLORIDE 2 MEQ/ML IV SOLN
Freq: Once | INTRAVENOUS | Status: AC
  Administered 2018-07-24: 10:00:00 via INTRAVENOUS
  Filled 2018-07-24: qty 10

## 2018-07-24 MED ORDER — SODIUM CHLORIDE 0.9% FLUSH
10.0000 mL | INTRAVENOUS | Status: DC | PRN
  Administered 2018-07-24: 10 mL
  Filled 2018-07-24: qty 10

## 2018-07-24 MED ORDER — PALONOSETRON HCL INJECTION 0.25 MG/5ML
INTRAVENOUS | Status: AC
  Filled 2018-07-24: qty 5

## 2018-07-24 MED ORDER — SODIUM CHLORIDE 0.9 % IV SOLN
Freq: Once | INTRAVENOUS | Status: AC
  Administered 2018-07-24: 12:00:00 via INTRAVENOUS
  Filled 2018-07-24: qty 5

## 2018-07-24 MED ORDER — SODIUM CHLORIDE 0.9 % IV SOLN
Freq: Once | INTRAVENOUS | Status: AC
  Administered 2018-07-24: 09:00:00 via INTRAVENOUS
  Filled 2018-07-24: qty 250

## 2018-07-24 MED ORDER — SODIUM CHLORIDE 0.9 % IV SOLN
40.0000 mg/m2 | Freq: Once | INTRAVENOUS | Status: AC
  Administered 2018-07-24: 79 mg via INTRAVENOUS
  Filled 2018-07-24: qty 79

## 2018-07-24 MED ORDER — PALONOSETRON HCL INJECTION 0.25 MG/5ML
0.2500 mg | Freq: Once | INTRAVENOUS | Status: AC
  Administered 2018-07-24: 0.25 mg via INTRAVENOUS

## 2018-07-24 MED ORDER — HEPARIN SOD (PORK) LOCK FLUSH 100 UNIT/ML IV SOLN
500.0000 [IU] | Freq: Once | INTRAVENOUS | Status: AC | PRN
  Administered 2018-07-24: 500 [IU]
  Filled 2018-07-24: qty 5

## 2018-07-24 NOTE — Patient Instructions (Signed)
[facility] Discharge Instructions for Patients Receiving Chemotherapy  Today you received the following chemotherapy agents: Cisplatin  To help prevent nausea and vomiting after your treatment, we encourage you to take your nausea medication  as prescribed.    If you develop nausea and vomiting that is not controlled by your nausea medication, call the clinic.   BELOW ARE SYMPTOMS THAT SHOULD BE REPORTED IMMEDIATELY:  *FEVER GREATER THAN 100.5 F  *CHILLS WITH OR WITHOUT FEVER  NAUSEA AND VOMITING THAT IS NOT CONTROLLED WITH YOUR NAUSEA MEDICATION  *UNUSUAL SHORTNESS OF BREATH  *UNUSUAL BRUISING OR BLEEDING  TENDERNESS IN MOUTH AND THROAT WITH OR WITHOUT PRESENCE OF ULCERS  *URINARY PROBLEMS  *BOWEL PROBLEMS  UNUSUAL RASH Items with * indicate a potential emergency and should be followed up as soon as possible.  Feel free to call the clinic should you have any questions or concerns. The clinic phone number is [phone].  Please show the CHEMO ALERT CARD at check-in to the Emergency Department and triage nurse.   

## 2018-07-24 NOTE — Telephone Encounter (Signed)
Oncology Nurse Navigator Documentation  Received call from Glenn Goodman, [facility], indicating Glenn Goodman has appt tomorrow 2:30. Dr. Maylon Peppers informed.  Glenn Orem, RN, BSN Head & Neck Oncology Nurse [place] [facility] [place] [phone]

## 2018-07-24 NOTE — Progress Notes (Signed)
Nutrition follow-up completed with patient during infusion for left base of tonsil cancer. Weight stable and documented as 181.5 pounds on March 4. Patient reports he has mouth sores. He continues to experience taste alterations. He has put Ensure Enlive through his feeding tube as well as additional water.  He has not really used Osmolite 1.5 yet. Noted labs: Glucose 156, BUN 24, creatinine 1.1, and albumin 3.9. Patient questioning how much water he should be drinking.  Estimated nutrition needs: 2200-2400 cal, 110-125 g protein, 2.4 L fluid.  Nutrition diagnosis: Inadequate oral intake continues.  Intervention: Patient was educated to increase oral nutrition supplements to 4 bottles daily.  This provides approximately 65% minimum calorie intake. Educated him that he could use Osmolite 1.5 via feeding tube in place of Ensure Enlive if he cannot drink oral nutrition supplement. At this time patient prefers using Ensure Enlive through his tube versus Osmolite 1.5 I have provided an additional case of Ensure Enlive.   Educated patient on the importance of frequent baking soda/salt water mouth rinses. Educated patient on the importance of increasing hydration.  Explained he needs a minimum of 40 ounces of water when he is using 7 bottles of oral nutrition supplements.  He may require more free water. Encouraged him to drink at least 64 ounces of water while he is consuming foods. Questions were answered.  Teach back method used.  Monitoring, evaluation, goals: Patient will consume adequate calorie and protein intake via oral intake as well as tube feeding to minimize weight loss.  Next visit: Thursday, March 12 during infusion.  **Disclaimer: This note was dictated with voice recognition software. Similar sounding words can inadvertently be transcribed and this note may contain transcription errors which may not have been corrected upon publication of note.**

## 2018-07-25 ENCOUNTER — Ambulatory Visit
Admission: RE | Admit: 2018-07-25 | Discharge: 2018-07-25 | Disposition: A | Discharge status: Home / Self Care | Payer: Commercial Managed Care - PPO | Source: Ambulatory Visit | Attending: Radiation Oncology | Admitting: Radiation Oncology

## 2018-07-25 DIAGNOSIS — H903 Sensorineural hearing loss, bilateral: Secondary | ICD-10-CM | POA: Diagnosis not present

## 2018-07-25 DIAGNOSIS — C099 Malignant neoplasm of tonsil, unspecified: Secondary | ICD-10-CM | POA: Diagnosis not present

## 2018-07-28 ENCOUNTER — Ambulatory Visit
Admission: RE | Admit: 2018-07-28 | Discharge: 2018-07-28 | Disposition: A | Discharge status: Home / Self Care | Payer: Commercial Managed Care - PPO | Source: Ambulatory Visit | Attending: Radiation Oncology | Admitting: Radiation Oncology

## 2018-07-28 DIAGNOSIS — C099 Malignant neoplasm of tonsil, unspecified: Secondary | ICD-10-CM | POA: Diagnosis not present

## 2018-07-29 ENCOUNTER — Ambulatory Visit
Admission: RE | Admit: 2018-07-29 | Discharge: 2018-07-29 | Disposition: A | Discharge status: Home / Self Care | Payer: Commercial Managed Care - PPO | Source: Ambulatory Visit | Attending: Radiation Oncology | Admitting: Radiation Oncology

## 2018-07-29 DIAGNOSIS — C099 Malignant neoplasm of tonsil, unspecified: Secondary | ICD-10-CM | POA: Diagnosis not present

## 2018-07-29 NOTE — Progress Notes (Signed)
[facility] OFFICE PROGRESS NOTE  Patient Care Team: [name], MD as PCP - General (Family Medicine) Melissa Montane, MD as Consulting Physician (Otolaryngology) Eppie Gibson, MD as Attending Physician (Radiation Oncology) Tish Men, MD as Consulting Physician (Hematology) Leota Sauers, RN as Oncology Nurse Navigator Schinke, [place], [place] as Speech Language Pathologist (Speech Pathology) Karie Mainland, RD as Dietitian (Nutrition) Wynelle Beckmann, Melodie Bouillon, PT as Physical Therapist (Physical Therapy) Kennith Center, LCSW as Social Worker Francina Ames, MD as Referring Physician (Otolaryngology)  HEME/ONC OVERVIEW: 1. Stage I (pT1pN1M0) left tonsil SCCa, p16+ -02/2018: CT neck showed enlarged left Level II and II LN with central necrosis (largest 2.4cm); effacement of the left IJ vein noted  -03/2018: US-guided bx of left cervical LN; path showed SCCa, p16+ -04/2018: PET showed FDG-avid left palatine tonsil lesion measuring ~1.3cm with associated FDG-avid left Level IIa LN's; no evidence of metastatic disease -05/2018: TORS by Dr. Raphael Gibney at [facility]; path showed HPV-positive SCCa of the left glossotonsillar sulcus, 1.6cm, focal margin positive, PNI+, 1/6 LN's positive (largest 6.8cm) with ECE; pT1pN1  -06/2018 - present: adjuvant chemoradiation with weekly cisplatin  2. Port and PEG placement in 06/2018   TREATMENT SUMMARY:  05/26/2018: TORS by Dr[facility] 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 -Overall, the  tinnitus remains relatively stable (baseline mild ringing with occasional, transient exacerbation), not interfering with hearing; grade 1 -Patient had baseline hearing difficulty prior to starting treatment; given the new bilateral tinnitus, repeat audiology study was done, and did not show any change in hearing -I counseled the patient on any concerning symptoms, such as worsening tinnitus interfering with instrumental ADLs, ear pain, or other hearing changes, for which he should contact the clinic promptly  Chemotherapy-associated nausea -Secondary to cisplatin -Overall relatively controlled with Zofran and Ativan -Dexamethasone reduced to 4mg  BID x 2 days after each chemotherapy treatment -Continue PRN anti-emetics  Chemotherapy-associated mucositis -Patient is using lidocaine swish and swallow, as well as PRN IR morphine 15mg  q4hrs PRN  -Pain relatively well controlled -Continue PRN IR morphine and viscous lidocaine   Oral candidiasis -On PO fluconazole since 07/14/2018, plan for 20 days -I have also prescribed the patient clotrimazole lozenges to prevent any recurrent oral candidiasis once he completes his oral fluconazole regimen  Opioid-associated constipation -I counseled the patient on importance of maintaining soft bowel movements, and I gave him instructions on various bowel regimens that can help his constipation  Protein malnutrition -Patient has lost approximately 3 pounds since last week -He is currently taking 4 cartons of Osmolite per day -I instructed patient to increase his Osmolite to 6 cartons per day -Continue follow-up with nutrition as scheduled  No orders of the defined types were placed in this encounter.  All questions were answered. The patient knows to call the clinic with any problems, questions or concerns. No barriers to learning was detected.  Return in 1 week for labs, port flush and clinic appt prior to Cycle 5 of weekly cisplatin.  Tish Men,  MD 07/30/2018 5:05 PM  CHIEF COMPLAINT: "I am still hearing the ringing"  INTERVAL HISTORY: Mr. Collene Mares this returns to clinic for follow-up of squamous cell carcinoma of the left tonsil on adjuvant chemoradiation.  Patient reports that he has stable baseline mild ringing in both ears since cycle 2 of weekly cisplatin, with occasional transient exacerbation of ringing, alternating in the ears, lasting 10 to 15 seconds, approximately 3-4 times per day.  Overall, the frequency of the acute worsening of the ringing has not changed, and it is not affecting his hearing.  He also reports that he has persistent mild soreness in the throat, for which he uses as needed liquid morphine as well as a viscous lidocaine with relatively adequate pain control.  He has mild constipation from using opioid medications.  He is eating some noodles by mouth, as well as using 4 cartons of Osmolite per day via the feeding tube.  SUMMARY OF ONCOLOGIC HISTORY:   Squamous cell carcinoma of left tonsil ([place])   03/20/2018 Procedure    FNA of the left cervical LN in office    03/20/2018 Pathology Results    Non-diagnostic    04/07/2018 Procedure    US-guided left cervical LN biopsy/FNA     04/07/2018 Pathology Results    (Accession: [id number]) Lymph node, needle/core biopsy, left cervical - METASTATIC SQUAMOUS CELL CARCINOMA INVOLVING A LYMPH NODE (1/1) - SEE COMMENT  p16 IHC positive. EBV ISH negative.     04/22/2018 Pathology Results    (Accession: [id number]) Tonsil, biopsy, left, base of tongue - INVASIVE KERATINIZING SQUAMOUS CELL CARCINOMA.    04/29/2018 Imaging    PET:  IMPRESSION: 1. Left palatine tonsil/adjacent pharyngeal mucosal space lesion is asymmetric, measures about 1.3 cm in long axis, and has a maximum SUV of 8.2, compatible with malignancy. There is associated hypermetabolic left level IIa adenopathy as shown on prior exams. 2. Symmetric accentuated activity in the lymphoid tissue at the  tongue base, maximum SUV 5.3, quite likely physiologic given the symmetry.  3. No findings of metastatic disease Pred to the chest, abdomen/pelvis, or skeleton.    05/26/2018 Surgery    Radical tonsillectomy    05/26/2018 Pathology Results    PROCEDURE: Radical tonsillectomy TUMOR SITE: Glossotonsillar sulcus TUMOR LATERALITY: LEFT TUMOR FOCALITY: Unifocal TUMOR SIZE: GREATEST DIMENSION: 1.6 cm HISTOLOGIC TYPE: HPV-mediated squamous cell carcinoma MARGINS: Involved by invasive tumor (focal) Specify location of closest margin: Tongue base (soft tissue) Note: Although an additional tongue base mucosal margin was submitted, location of tumor extension to margin appears 1 cm from the mucosal surface. LYMPHOVASCULAR INVASION: Not identified PERINEURAL INVASION: Present REGIONAL LYMPH NODES:  NUMBER OF LYMPH NODES INVOLVED: 1  NUMBER OF LYMPH NODES EXAMINED: 6  LATERALITY OF LYMPH NODES INVOLVED: Ipsilateral  SIZE OF LARGEST METASTATIC DEPOSIT: 6.8 cm  EXTRANODAL EXTENSION: Present   Distance from lymph node capsule: 6 millimeters PATHOLOGIC STAGE CLASSIFICATION (pTNM, AJCC 8TH Ed): pT1 pN1 (HPV-mediated oropharynx tumor)  pT1: Tumor 2 cm or smaller in greatest dimension  pN1: Metastasis in 4 or fewer lymph nodes ANCILLARY STUDIES:  p16 expression by immunohistochemistry: Positive  HPV by PCR: pending    05/26/2018 Cancer Staging    Staging form: Pharynx - HPV-Mediated Oropharynx, AJCC 8th Edition - Pathologic stage from 05/26/2018: Stage I (pT1, pN1, cM0, p16+) - Signed by Tish Men, MD on 06/18/2018    07/10/2018 - 07/10/2018 Chemotherapy    The patient had palonosetron (ALOXI) injection 0.25 mg, 0.25 mg, Intravenous,  Once, 0 of 3 cycles CISplatin (PLATINOL) 198 mg in sodium chloride 0.9 % 500 mL chemo infusion,  100 mg/m2 = 198 mg, Intravenous,  Once, 0 of 3 cycles fosaprepitant (EMEND) 150 mg, dexamethasone (DECADRON) 12 mg in sodium chloride 0.9 % 145 mL  IVPB, , Intravenous,  Once, 0 of 3 cycles  for chemotherapy treatment.     07/10/2018 -  Chemotherapy    The patient had palonosetron (ALOXI) injection 0.25 mg, 0.25 mg, Intravenous,  Once, 3 of 7 cycles Administration: 0.25 mg (07/10/2018), 0.25 mg (07/18/2018), 0.25 mg (07/24/2018) CISplatin (PLATINOL) 79 mg in sodium chloride 0.9 % 250 mL chemo infusion, 40 mg/m2 = 79 mg, Intravenous,  Once, 3 of 7 cycles Administration: 79 mg (07/10/2018), 79 mg (07/18/2018), 79 mg (07/24/2018) fosaprepitant (EMEND) 150 mg, dexamethasone (DECADRON) 12 mg in sodium chloride 0.9 % 145 mL IVPB, , Intravenous,  Once, 3 of 7 cycles Administration:  (07/10/2018),  (07/18/2018),  (07/24/2018)  for chemotherapy treatment.      Malignant neoplasm of base of tongue ([place])   05/09/2018 Initial Diagnosis    Malignant neoplasm of base of tongue ([place])    05/09/2018 Cancer Staging    Staging form: Pharynx - HPV-Mediated Oropharynx, AJCC 8th Edition - Clinical: Stage I (cT1, cN1, cM0, p16+) - Signed by Eppie Gibson, MD on 05/09/2018    06/11/2018 Cancer Staging    Staging form: Pharynx - HPV-Mediated Oropharynx, AJCC 8th Edition - Pathologic: Stage I (pT1, pN1, cM0, p16+) - Signed by Eppie Gibson, MD on 06/11/2018     REVIEW OF SYSTEMS:   Constitutional: ( - ) fevers, ( - )  chills , ( - ) night sweats Eyes: ( - ) blurriness of vision, ( - ) double vision, ( - ) watery eyes Ears, nose, mouth, throat, and face: ( + ) mucositis, ( + ) sore throat Respiratory: ( - ) cough, ( - ) dyspnea, ( - ) wheezes Cardiovascular: ( - ) palpitation, ( - ) chest discomfort, ( - ) lower extremity swelling Gastrointestinal:  ( + ) nausea, ( - ) heartburn, ( - ) change in bowel habits Skin: ( - ) abnormal skin rashes Lymphatics: ( - ) new lymphadenopathy, ( - ) easy bruising Neurological: ( - ) numbness, ( - ) tingling, ( - ) new weaknesses Behavioral/Psych: ( - ) mood change, ( - ) new changes  All other systems were reviewed with the  patient and are negative.  I have reviewed the past medical history, past surgical history, social history and family history with the patient and they are unchanged from previous note.  ALLERGIES:  has No Known Allergies.  MEDICATIONS:  Current Outpatient Medications  Medication Sig Dispense Refill  . cyclobenzaprine (FLEXERIL) 10 MG tablet Take 1 tablet (10 mg total) by mouth 3 (three) times daily as needed for up to 30 days (for hiccups). 60 tablet 2  . famotidine (PEPCID) 20 MG tablet Take 20 mg by mouth daily as needed for heartburn or indigestion.    . fluconazole (DIFLUCAN) 100 MG tablet Take 2 tablets today, then 1 tablet daily x 20 more days. 22 tablet 0  . lidocaine (XYLOCAINE) 2 % solution Patient: Mix 1part 2% viscous lidocaine, 1part H20. Swish & swallow 56mL of diluted mixture, 70min before meals and at bedtime, up to QID 100 mL 5  . lidocaine-prilocaine (EMLA) cream Apply to affected area once 30 g 3  . LORazepam (ATIVAN) 0.5 MG tablet Take 1 tablet (0.5 mg total) by mouth every 6 (six) hours as needed (Nausea or vomiting). 30 tablet 0  . ondansetron (ZOFRAN) 8  MG tablet Take 1 tablet (8 mg total) by mouth 2 (two) times daily as needed. Start on the third day after chemotherapy. 30 tablet 1  . pramipexole (MIRAPEX) 0.5 MG tablet     . sodium fluoride (PREVIDENT 5000 PLUS) 1.1 % CREA dental cream Apply to tooth brush. Brush teeth for 2 minutes. Spit out excess. DO NOT rinse afterwards. Repeat nightly. 1 Tube prn  . Cholecalciferol (VITAMIN D3) 50 MCG (2000 UT) TABS Take 2,000 Units by mouth daily.    . clotrimazole (MYCELEX) 10 MG troche Take 1 lozenge (10 mg total) by mouth 5 (five) times daily for 30 days. 150 lozenge 1  . dexamethasone (DECADRON) 4 MG tablet Take 2 tablets by mouth once a day on the day after chemotherapy and then take 2 tablets two times a day for 2 days. Take with food. (Patient not taking: Reported on 07/23/2018) 30 tablet 1  . GLUCOSAMINE-CHONDROITIN PO Take 1  tablet by mouth daily.    . Magnesium 500 MG TABS Take 500 mg by mouth daily.    Marland Kitchen morphine (MSIR) 15 MG tablet Take 1 tablet (15 mg total) by mouth every 4 (four) hours as needed for severe pain. 40 tablet 0  . oxyCODONE (OXY IR/ROXICODONE) 5 MG immediate release tablet PLEASE SEE ATTACHED FOR DETAILED DIRECTIONS    . phenazopyridine (PYRIDIUM) 200 MG tablet Take 1 tablet (200 mg total) by mouth 3 (three) times daily as needed for pain. (Patient not taking: Reported on 07/23/2018) 10 tablet 0  . prochlorperazine (COMPAZINE) 10 MG tablet Take 1 tablet (10 mg total) by mouth every 6 (six) hours as needed (Nausea or vomiting). (Patient not taking: Reported on 07/23/2018) 30 tablet 1   No current facility-administered medications for this visit.     PHYSICAL EXAMINATION: ECOG PERFORMANCE STATUS: 1 - Symptomatic but completely ambulatory  Today's Vitals   07/30/18 1515 07/30/18 1523  BP: 117/85   Pulse: (!) 116   Resp: 18   Temp: 98 F (36.7 C)   TempSrc: Oral   SpO2: 98%   Weight: 178 lb 12.8 oz (81.1 kg)   Height: 5\' 8"  (1.727 m)   PainSc:  3    Body mass index is 27.19 kg/m.  Filed Weights   07/30/18 1515  Weight: 178 lb 12.8 oz (81.1 kg)    GENERAL: alert, no distress and comfortable SKIN: skin color, texture, turgor are normal, no rashes or significant lesions EYES: conjunctiva are pink and non-injected, sclera clear OROPHARYNX: mild erythema in the posterior oropharynx, c/w Grade 1 mucositis NECK: supple, non-tender, left neck scar healed  LYMPH:  no palpable lymphadenopathy in the cervical LUNGS: clear to auscultation with normal breathing effort HEART: regular rate & rhythm and no murmurs and no lower extremity edema ABDOMEN: soft, non-tender, non-distended, normal bowel sounds Musculoskeletal: no cyanosis of digits and no clubbing  PSYCH: alert & oriented x 3, fluent speech NEURO: no focal motor/sensory deficits  LABORATORY DATA:  I have reviewed the data as listed     Component Value Date/Time   NA 136 07/30/2018 1440   K 3.9 07/30/2018 1440   CL 99 07/30/2018 1440   CO2 28 07/30/2018 1440   GLUCOSE 162 (H) 07/30/2018 1440   BUN 19 07/30/2018 1440   CREATININE 0.80 07/30/2018 1440   CALCIUM 8.7 (L) 07/30/2018 1440   PROT 6.4 (L) 07/23/2018 1200   ALBUMIN 3.9 07/23/2018 1200   AST 15 07/23/2018 1200   AST 17 06/18/2018 1105   ALT  32 07/23/2018 1200   ALT 38 06/18/2018 1105   ALKPHOS 76 07/23/2018 1200   BILITOT 0.3 07/23/2018 1200   BILITOT 0.4 06/18/2018 1105   GFRNONAA >60 07/30/2018 1440   GFRAA >60 07/30/2018 1440    No results found for: SPEP, UPEP  Lab Results  Component Value Date   WBC 8.3 07/30/2018   NEUTROABS 6.9 07/30/2018   HGB 13.5 07/30/2018   HCT 40.8 07/30/2018   MCV 93.2 07/30/2018   PLT 159 07/30/2018      Chemistry      Component Value Date/Time   NA 136 07/30/2018 1440   K 3.9 07/30/2018 1440   CL 99 07/30/2018 1440   CO2 28 07/30/2018 1440   BUN 19 07/30/2018 1440   CREATININE 0.80 07/30/2018 1440      Component Value Date/Time   CALCIUM 8.7 (L) 07/30/2018 1440   ALKPHOS 76 07/23/2018 1200   AST 15 07/23/2018 1200   AST 17 06/18/2018 1105   ALT 32 07/23/2018 1200   ALT 38 06/18/2018 1105   BILITOT 0.3 07/23/2018 1200   BILITOT 0.4 06/18/2018 1105

## 2018-07-30 ENCOUNTER — Inpatient Hospital Stay: Payer: Commercial Managed Care - PPO

## 2018-07-30 ENCOUNTER — Other Ambulatory Visit: Payer: Self-pay

## 2018-07-30 ENCOUNTER — Inpatient Hospital Stay (HOSPITAL_BASED_OUTPATIENT_CLINIC_OR_DEPARTMENT_OTHER): Payer: Commercial Managed Care - PPO | Admitting: Hematology

## 2018-07-30 ENCOUNTER — Telehealth: Payer: Self-pay | Admitting: Hematology

## 2018-07-30 ENCOUNTER — Ambulatory Visit
Admission: RE | Admit: 2018-07-30 | Discharge: 2018-07-30 | Disposition: A | Discharge status: Home / Self Care | Payer: Commercial Managed Care - PPO | Source: Ambulatory Visit | Attending: Radiation Oncology | Admitting: Radiation Oncology

## 2018-07-30 ENCOUNTER — Encounter: Payer: Self-pay | Admitting: Hematology

## 2018-07-30 VITALS — BP 117/85 | HR 116 | Temp 98.0°F | Resp 18 | Ht 68.0 in | Wt 178.8 lb

## 2018-07-30 DIAGNOSIS — B370 Candidal stomatitis: Secondary | ICD-10-CM

## 2018-07-30 DIAGNOSIS — Z79899 Other long term (current) drug therapy: Secondary | ICD-10-CM

## 2018-07-30 DIAGNOSIS — H903 Sensorineural hearing loss, bilateral: Secondary | ICD-10-CM

## 2018-07-30 DIAGNOSIS — K5903 Drug induced constipation: Secondary | ICD-10-CM

## 2018-07-30 DIAGNOSIS — Z931 Gastrostomy status: Secondary | ICD-10-CM

## 2018-07-30 DIAGNOSIS — C099 Malignant neoplasm of tonsil, unspecified: Secondary | ICD-10-CM | POA: Diagnosis not present

## 2018-07-30 DIAGNOSIS — T402X5S Adverse effect of other opioids, sequela: Secondary | ICD-10-CM

## 2018-07-30 DIAGNOSIS — H9319 Tinnitus, unspecified ear: Secondary | ICD-10-CM

## 2018-07-30 DIAGNOSIS — R066 Hiccough: Secondary | ICD-10-CM

## 2018-07-30 DIAGNOSIS — E46 Unspecified protein-calorie malnutrition: Secondary | ICD-10-CM

## 2018-07-30 DIAGNOSIS — T451X5A Adverse effect of antineoplastic and immunosuppressive drugs, initial encounter: Secondary | ICD-10-CM

## 2018-07-30 DIAGNOSIS — C01 Malignant neoplasm of base of tongue: Secondary | ICD-10-CM

## 2018-07-30 DIAGNOSIS — G2581 Restless legs syndrome: Secondary | ICD-10-CM

## 2018-07-30 DIAGNOSIS — T402X5A Adverse effect of other opioids, initial encounter: Secondary | ICD-10-CM

## 2018-07-30 DIAGNOSIS — Z95828 Presence of other vascular implants and grafts: Secondary | ICD-10-CM

## 2018-07-30 DIAGNOSIS — H9190 Unspecified hearing loss, unspecified ear: Secondary | ICD-10-CM

## 2018-07-30 DIAGNOSIS — T451X5S Adverse effect of antineoplastic and immunosuppressive drugs, sequela: Secondary | ICD-10-CM

## 2018-07-30 DIAGNOSIS — K1231 Oral mucositis (ulcerative) due to antineoplastic therapy: Secondary | ICD-10-CM

## 2018-07-30 DIAGNOSIS — R110 Nausea: Secondary | ICD-10-CM

## 2018-07-30 LAB — BASIC METABOLIC PANEL - CANCER CENTER ONLY
Anion gap: 9 (ref 5–15)
BUN: 19 mg/dL (ref 6–20)
CO2: 28 mmol/L (ref 22–32)
Calcium: 8.7 mg/dL — ABNORMAL LOW (ref 8.9–10.3)
Chloride: 99 mmol/L (ref 98–111)
Creatinine: 0.8 mg/dL (ref 0.61–1.24)
GFR, Est AFR Am: >60 mL/min (ref >60)
GFR, Estimated: >60 mL/min (ref >60)
GLUCOSE: 162 mg/dL — AB (ref 70–99)
Potassium: 3.9 mmol/L (ref 3.5–5.1)
Sodium: 136 mmol/L (ref 135–145)

## 2018-07-30 LAB — CBC WITH DIFFERENTIAL (CANCER CENTER ONLY)
Abs Immature Granulocytes: 0.06 10*3/uL (ref 0.00–0.07)
Basophils Absolute: 0 10*3/uL (ref 0.0–0.1)
Basophils Relative: 0 %
Eosinophils Absolute: 0 10*3/uL (ref 0.0–0.5)
Eosinophils Relative: 0 %
HCT: 40.8 % (ref 39.0–52.0)
Hemoglobin: 13.5 g/dL (ref 13.0–17.0)
Immature Granulocytes: 1 %
Lymphocytes Relative: 6 %
Lymphs Abs: 0.5 10*3/uL — ABNORMAL LOW (ref 0.7–4.0)
MCH: 30.8 pg (ref 26.0–34.0)
MCHC: 33.1 g/dL (ref 30.0–36.0)
MCV: 93.2 fL (ref 80.0–100.0)
Monocytes Absolute: 0.7 10*3/uL (ref 0.1–1.0)
Monocytes Relative: 9 %
Neutro Abs: 6.9 10*3/uL (ref 1.7–7.7)
Neutrophils Relative %: 84 %
Platelet Count: 159 10*3/uL (ref 150–400)
RBC: 4.38 MIL/uL (ref 4.22–5.81)
RDW: 13 % (ref 11.5–15.5)
WBC Count: 8.3 10*3/uL (ref 4.0–10.5)
nRBC: 0 % (ref 0.0–0.2)

## 2018-07-30 LAB — MAGNESIUM: Magnesium: 2 mg/dL (ref 1.7–2.4)

## 2018-07-30 MED ORDER — LORAZEPAM 0.5 MG PO TABS: 0.5000 mg | ORAL_TABLET | Freq: Four times a day (QID) | ORAL | 0 refills | Status: DC | PRN

## 2018-07-30 MED ORDER — HEPARIN SOD (PORK) LOCK FLUSH 100 UNIT/ML IV SOLN
500.0000 [IU] | Freq: Once | INTRAVENOUS | Status: AC | PRN
  Administered 2018-07-30: 500 [IU]
  Filled 2018-07-30: qty 5

## 2018-07-30 MED ORDER — CLOTRIMAZOLE 10 MG MT LOZG: 10.0000 mg | LOZENGE | Freq: Every day | OROMUCOSAL | 1 refills | Status: AC

## 2018-07-30 MED ORDER — SODIUM CHLORIDE 0.9% FLUSH
10.0000 mL | INTRAVENOUS | Status: DC | PRN
  Administered 2018-07-30: 10 mL
  Filled 2018-07-30: qty 10

## 2018-07-30 NOTE — Telephone Encounter (Signed)
Per 3/11 los No change in appts

## 2018-07-31 ENCOUNTER — Inpatient Hospital Stay: Payer: Commercial Managed Care - PPO

## 2018-07-31 ENCOUNTER — Inpatient Hospital Stay: Payer: Commercial Managed Care - PPO | Admitting: Nutrition

## 2018-07-31 ENCOUNTER — Ambulatory Visit
Admission: RE | Admit: 2018-07-31 | Discharge: 2018-07-31 | Disposition: A | Discharge status: Home / Self Care | Payer: Commercial Managed Care - PPO | Source: Ambulatory Visit | Attending: Radiation Oncology | Admitting: Radiation Oncology

## 2018-07-31 VITALS — BP 140/93 | HR 88 | Temp 98.0°F | Resp 20

## 2018-07-31 DIAGNOSIS — C099 Malignant neoplasm of tonsil, unspecified: Secondary | ICD-10-CM

## 2018-07-31 MED ORDER — PALONOSETRON HCL INJECTION 0.25 MG/5ML
0.2500 mg | Freq: Once | INTRAVENOUS | Status: AC
  Administered 2018-07-31: 0.25 mg via INTRAVENOUS

## 2018-07-31 MED ORDER — PALONOSETRON HCL INJECTION 0.25 MG/5ML
INTRAVENOUS | Status: AC
  Filled 2018-07-31: qty 5

## 2018-07-31 MED ORDER — SODIUM CHLORIDE 0.9% FLUSH
10.0000 mL | INTRAVENOUS | Status: DC | PRN
  Administered 2018-07-31: 10 mL
  Filled 2018-07-31: qty 10

## 2018-07-31 MED ORDER — SODIUM CHLORIDE 0.9 % IV SOLN
Freq: Once | INTRAVENOUS | Status: AC
  Administered 2018-07-31: 11:00:00 via INTRAVENOUS
  Filled 2018-07-31: qty 250

## 2018-07-31 MED ORDER — SODIUM CHLORIDE 0.9 % IV SOLN
Freq: Once | INTRAVENOUS | Status: AC
  Administered 2018-07-31: 13:00:00 via INTRAVENOUS
  Filled 2018-07-31: qty 5

## 2018-07-31 MED ORDER — POTASSIUM CHLORIDE 2 MEQ/ML IV SOLN
Freq: Once | INTRAVENOUS | Status: AC
  Administered 2018-07-31: 11:00:00 via INTRAVENOUS
  Filled 2018-07-31: qty 10

## 2018-07-31 MED ORDER — OSMOLITE 1.5 CAL PO LIQD: ORAL | 0 refills | Status: DC

## 2018-07-31 MED ORDER — HEPARIN SOD (PORK) LOCK FLUSH 100 UNIT/ML IV SOLN
500.0000 [IU] | Freq: Once | INTRAVENOUS | Status: AC | PRN
  Administered 2018-07-31: 500 [IU]
  Filled 2018-07-31: qty 5

## 2018-07-31 MED ORDER — SODIUM CHLORIDE 0.9 % IV SOLN
40.0000 mg/m2 | Freq: Once | INTRAVENOUS | Status: AC
  Administered 2018-07-31: 79 mg via INTRAVENOUS
  Filled 2018-07-31: qty 79

## 2018-07-31 NOTE — Progress Notes (Signed)
Nutrition follow-up completed with patient during infusion for left base of tonsil cancer. Weight decreased and documented as 178.8 pounds March 11 down from 181.5 pounds March 4. Patient reports taste alterations are affecting his ability to eat. Patient is using 4 bottles of Osmolite 1.5 via feeding tube with 60 mL free water before and after bolus feedings. He is trying to continue to drink water and eat soft foods.  Nutrition diagnosis: Inadequate oral intake continues.  Estimated nutrition needs: 2200-2400 cal, 100-125 g protein, 2.4 L fluid.  Intervention: Educated patient to increase Osmolite 1.5 via PEG to 1-1/2 bottles 4 times daily with 60 mL of free water before and after bolus feeding. Educated him to continue to consume soft foods and liquids as tolerated to make up remaining needed calories. Tube feeding goal rate once no oral intake occurs is 7 bottles of Osmolite 1.5. 7 bottles Osmolite 1.5 provides 2485 cal, 104.3 g protein, 2467 mL free water. Tube feeding orders written and home care agency notified. Questions were answered teach back method used.  Monitoring, evaluation, goals: Patient will tolerate tube feeding plus oral intake to minimize further weight loss.  Next visit: Thursday, March 19 during infusion.  **Disclaimer: This note was dictated with voice recognition software. Similar sounding words can inadvertently be transcribed and this note may contain transcription errors which may not have been corrected upon publication of note.**

## 2018-08-01 ENCOUNTER — Other Ambulatory Visit: Payer: Self-pay

## 2018-08-01 ENCOUNTER — Ambulatory Visit
Admission: RE | Admit: 2018-08-01 | Discharge: 2018-08-01 | Disposition: A | Discharge status: Home / Self Care | Payer: Commercial Managed Care - PPO | Source: Ambulatory Visit | Attending: Radiation Oncology | Admitting: Radiation Oncology

## 2018-08-01 DIAGNOSIS — B370 Candidal stomatitis: Secondary | ICD-10-CM | POA: Diagnosis not present

## 2018-08-01 DIAGNOSIS — T451X5A Adverse effect of antineoplastic and immunosuppressive drugs, initial encounter: Secondary | ICD-10-CM | POA: Diagnosis not present

## 2018-08-01 DIAGNOSIS — C099 Malignant neoplasm of tonsil, unspecified: Secondary | ICD-10-CM | POA: Diagnosis not present

## 2018-08-04 ENCOUNTER — Ambulatory Visit: Payer: Commercial Managed Care - PPO

## 2018-08-04 ENCOUNTER — Ambulatory Visit
Admission: RE | Admit: 2018-08-04 | Discharge: 2018-08-04 | Disposition: A | Discharge status: Home / Self Care | Payer: Commercial Managed Care - PPO | Source: Ambulatory Visit | Attending: Radiation Oncology | Admitting: Radiation Oncology

## 2018-08-04 ENCOUNTER — Other Ambulatory Visit: Payer: Self-pay

## 2018-08-04 DIAGNOSIS — C099 Malignant neoplasm of tonsil, unspecified: Secondary | ICD-10-CM | POA: Diagnosis not present

## 2018-08-04 DIAGNOSIS — R1310 Dysphagia, unspecified: Secondary | ICD-10-CM

## 2018-08-04 MED ORDER — SONAFINE EX EMUL
1.0000 "application " | Freq: Once | CUTANEOUS | Status: AC
  Administered 2018-08-04: 1 via TOPICAL

## 2018-08-04 NOTE — Patient Instructions (Signed)
Add Shaker back into your routine when you feel you can, with you PEG tube.

## 2018-08-04 NOTE — Therapy (Signed)
[facility] [address] [place], [place], [address] Phone: [phone]   Fax:  [phone]  Speech Language Pathology Treatment  Patient Details  Name: Glenn Goodman MRN: [id number] Date of Birth: Feb 23, 1961 Referring Provider (SLP): Eppie Gibson, MD   Encounter Date: 08/04/2018  End of Session - 08/04/18 0912    Visit Number  2    Number of Visits  7    Date for SLP Re-Evaluation  12/16/18    SLP Start Time  0822   pt went to [address] office   SLP Stop Time   0850    SLP Time Calculation (min)  28 min    Activity Tolerance  Patient tolerated treatment well       Past Medical History:  Diagnosis Date  . Arthritis   . OSA on CPAP   . Restless leg syndrome     Past Surgical History:  Procedure Laterality Date  . DIRECT LARYNGOSCOPY Left 04/22/2018   Dr. Janace Hoard  . FACIAL FRACTURE SURGERY Left 1986  . IR GASTROSTOMY TUBE MOD SED  06/30/2018  . IR IMAGING GUIDED PORT INSERTION  06/30/2018  . MODIFIED RADICAL NECK DISSECTION Left 05/26/2018   Dr. Nicolette Bang, [facility]  . transoral robotic surgery  05/26/2018   TORS, Dr. Nicolette Bang [facility]    There were no vitals filed for this visit.  Subjective Assessment - 08/04/18 0826    Subjective  Approx 4 cans/day of tube feed. -approx 16oz. tube feed/day with water included.    Currently in Pain?  Yes    Pain Score  3     Pain Location  Throat    Pain Orientation  Mid    Pain Descriptors / Indicators  Sore    Pain Type  Acute pain    Pain Onset  1 to 4 weeks ago    Pain Frequency  Intermittent    Aggravating Factors   swallowing    Pain Relieving Factors  food - sometimes            ADULT SLP TREATMENT - 08/04/18 0829      General Information   Behavior/Cognition  Alert;Cooperative;Pleasant mood      Treatment Provided   Treatment provided  Dysphagia      Dysphagia Treatment   Temperature Spikes Noted  No    Respiratory Status  Room air    Oral Cavity -  Dentition  Adequate natural dentition    Treatment Methods  Skilled observation;Therapeutic exercise;Patient/caregiver education;Compensation strategy training    Patient observed directly with PO's  Yes    Type of PO's observed  Dysphagia 1 (puree);Thin liquids    Liquids provided via  Cup    Oral Phase Signs & Symptoms  --   none   Pharyngeal Phase Signs & Symptoms  --   none   Amount of cueing  Independent    Other treatment/comments  Pt eating three times a day, biggest meal is dinner "a couple of cups of food." Last night tillapia, green beans and mashed potatoes. Pt completed HEP with modified independence. Reminded pt about overt s/s aspiration PNA. "I really think the exercises have been the reason (I can still eat)." Pt not doing [place] due to "gimpy" PEG tube. SLP encouraged pt to add back in when possible.      Assessment / Recommendations / Plan   Plan  Continue with current plan of care      Progression Toward Goals   Progression toward  goals  Progressing toward goals         SLP Short Term Goals - 08/04/18 0913      SLP SHORT TERM GOAL #1   Title  pt will complete HEP with rare min A over two sessions    Baseline  08-04-18    Time  1    Period  --   visits-for all STGs (visit 3)   Status  On-going      SLP SHORT TERM GOAL #2   Title  pt will tell SLP why he is completing HEP     Time  1    Period  --   session 3   Status  Revised   time period - 2 sessions     SLP SHORT TERM GOAL #3   Title  pt will tell SLP how a food journal can facilitate return to most normal diet    Time  2    Period  --   (session 4)   Status  On-going       SLP Long Term Goals - 08/04/18 0914      SLP LONG TERM GOAL #1   Title  pt will complete HEP with modified independence x2 sessions    Baseline  07-06-18    Time  3    Period  --   visits- for all LTGs (visit #5)   Status  On-going      SLP LONG TERM GOAL #2   Title  pt will tell SLP 3 overt s/s of aspiration PNA with  modified independence    Time  3   visit #5   Status  On-going       Plan - 08/04/18 0912    Clinical Impression Statement  Pt with oropharyngeal swallowing essentially WNL with modified diet, no overt s/s aspiration PNA today, nor any reported. Pt with excellent success with HEP and no overt s/s aspiration with puree/thin. However the probability of swallowing difficulty increases dramatically with the initiation of chemo and radiation therapy. Pt will need to be followed by SLP for regular assessment of accurate HEP completion as well as for safety with POs both during and following treatment/s.    Speech Therapy Frequency  --   once approx every four weeks   Duration  --   6 sessions (7 total)   Treatment/Interventions  Aspiration precaution training;Diet toleration management by SLP;Pharyngeal strengthening exercises;Internal/external aids;Trials of upgraded texture/liquids;Compensatory strategies;Patient/family education;SLP instruction and feedback;Cueing hierarchy;Environmental controls    Potential to Achieve Goals  Good    SLP Home Exercise Plan  provided today    Consulted and Agree with Plan of Care  Patient       Patient will benefit from skilled therapeutic intervention in order to improve the following deficits and impairments:   Dysphagia, unspecified type    Problem List Patient Active Problem List   Diagnosis Date Noted  . Protein malnutrition ([place]) 07/30/2018  . Mucositis due to chemotherapy 07/23/2018  . Chemotherapy-induced nausea 07/17/2018  . Chemotherapy-induced diarrhea 07/17/2018  . Oral candidiasis 07/17/2018  . Port-A-Cath in place 07/09/2018  . Sensorineural hearing loss (SNHL) of both ears 06/18/2018  . Odynophagia 06/18/2018  . Malignant neoplasm of base of tongue ([place]) 05/09/2018  . 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

## 2018-08-05 ENCOUNTER — Ambulatory Visit
Admission: RE | Admit: 2018-08-05 | Discharge: 2018-08-05 | Disposition: A | Discharge status: Home / Self Care | Payer: Commercial Managed Care - PPO | Source: Ambulatory Visit | Attending: Radiation Oncology | Admitting: Radiation Oncology

## 2018-08-05 ENCOUNTER — Other Ambulatory Visit: Payer: Self-pay

## 2018-08-05 DIAGNOSIS — C099 Malignant neoplasm of tonsil, unspecified: Secondary | ICD-10-CM | POA: Diagnosis not present

## 2018-08-05 NOTE — Progress Notes (Signed)
[facility] OFFICE PROGRESS NOTE  Patient Care Team: [name], MD as PCP - General (Family Medicine) Melissa Montane, MD as Consulting Physician (Otolaryngology) Eppie Gibson, MD as Attending Physician (Radiation Oncology) Tish Men, MD as Consulting Physician (Hematology) Leota Sauers, RN as Oncology Nurse Navigator [place], [place], [place] as Speech Language Pathologist (Speech Pathology) Karie Mainland, RD as Dietitian (Nutrition) Wynelle Beckmann, Melodie Bouillon, PT as Physical Therapist (Physical Therapy) Kennith Center, LCSW as Social Worker Francina Ames, MD as Referring Physician (Otolaryngology)  HEME/ONC OVERVIEW: 1. Stage I (pT1pN1M0) left tonsil SCCa, p16+ -02/2018: CT neck showed enlarged left Level II and II LN with central necrosis (largest 2.4cm); effacement of the left IJ vein noted  -03/2018: US-guided bx of left cervical LN; path showed SCCa, p16+ -04/2018: PET showed FDG-avid left palatine tonsil lesion measuring ~1.3cm with associated FDG-avid left Level IIa LN's; no evidence of metastatic disease -05/2018: TORS by Dr. Raphael Gibney at [facility]; path showed HPV-positive SCCa of the left glossotonsillar sulcus, 1.6cm, focal margin positive, PNI+, 1/6 LN's positive (largest 6.8cm) with ECE; pT1pN1  -06/2018 - present: adjuvant chemoradiation with weekly cisplatin  2. Port and PEG placement in 06/2018   TREATMENT SUMMARY:  05/26/2018: TORS by Dr. Nicolette Bang at [facility]   07/07/2018 - present: adjuvant chemoradiation with weekly cisplatin 40mg /m2 (due to moderate hearing loss)  ASSESSMENT & PLAN:   Stage I (cT1N1M0) left tonsil SCCa, p16+ -S/p 4 cycles of weekly cisplatin -Labs adequate today, proceed with Cycle 5 of weekly cisplatin on 08/07/2018 -See management of tinnitus below  -PRN antiemetics: Zofran, Ativan, dexamethasone; no Compazine due to worsening RLS  Chemotherapy-associated tinnitus -Secondary to cisplatin -Patient has baseline  hearing impairment, and since starting cisplatin, he has noticed mild persistent baseline tinnitus with transient exacerbations, not interfering with hearing; Grade 1 -Interim audiology testing after starting chemotherapy showed no change in hearing -Patient is concerned about the long-term risk of hearing impairment and inquires the loss of efficacy if he omits Cycle 6 and 7 of weekly cisplatin -I discussed with the patient that while there is no study evaluating the degree of impaired efficacy with shortened treatment, 5 doses of weekly cisplatin (ie cumulative dose of 200mg /m2) appears to be the threshold of benefits -Furthermore, as he is receiving chemoradiation in the adjuvant setting, the role for chemotherapy is less than in the definitive chemoradiation -Therefore, we will plan to administer at least 5 doses of cisplatin, and then decide whether to give Cycle 6 and 7 of cisplatin based on his degree of tinnitus  -I counseled the patient on any concerning symptoms, including worsening tinnitus interfering with instrumental ADLs or other hearing changes, for which he should contact the clinic promptly  Chemotherapy-associated nausea -Secondary to cisplatin -Overall relatively well controlled with Zofran and Ativan -Dexamethasone dose reduced due to hiccups -Continue PRN antiemetics  Chemotherapy-associated mucositis -Pain relatively well controlled with a combination of PRN IR morphine 15mg  q4hrs and lidocaine swish and swallow -Continue the regimen above  Oral candidiasis -Patient recently completed a 3 week course of p.o. fluconazole -I have also prescribed the patient clotrimazole lozenges in the event of recurrent symptoms of oral candidiasis  Opioid-associated constipation -Bowel regimen  Protein malnutrition -Weight overall stable since the last visit -Enteral feeding: 6 cartons of Osmolite per day -I encouraged the patient to maintain adequate free water intake, and to avoid  excess coffee or high-glucose content product, as they can lead to polyuria and dehydration -Continue follow-up with  nutrition as scheduled  No orders of the defined types were placed in this encounter.  All questions were answered. The patient knows to call the clinic with any problems, questions or concerns. No barriers to learning was detected.  Return in 1 week for labs, port flush, and the clinic appointment prior to Cycle 6 of weekly cisplatin.  Tish Men, MD 08/06/2018 4:20 PM  CHIEF COMPLAINT: "I am just a little concerned about my hearing"  INTERVAL HISTORY: Glenn Goodman return to clinic for follow-up of adjuvant chemoradiation for left tonsillar carcinoma.  Patient reports that since last visit, he continues have mild persistent bilateral tinnitus with episodic transient exacerbations (3-4 times per day, lasting 10 to 30 seconds).  During these episodes of worsening tinnitus, he does have slightly decreased hearing, but the Ia resolves, his hearing returned back to baseline.  Overall, it is not interfering with his hearing or instrumental ADLs.  He is concerned about the potential long-term risk of hearing impairment, and inquires about stopping cisplatin after dose #5.  He reports occasional nausea, which is relatively well controlled with Zofran.  He is a taking 6 cans of Osmolite per day via the feeding tube, and approximately 20 ounces of free water.  He is also drinking coffee and grape juice, the latter of which is likely causing his hyperglycemia.  SUMMARY OF ONCOLOGIC HISTORY:   Squamous cell carcinoma of left tonsil ([place])   03/20/2018 Procedure    FNA of the left cervical LN in office    03/20/2018 Pathology Results    Non-diagnostic    04/07/2018 Procedure    US-guided left cervical LN biopsy/FNA     04/07/2018 Pathology Results    (Accession: [id number]) Lymph node, needle/core biopsy, left cervical - METASTATIC SQUAMOUS CELL CARCINOMA INVOLVING A LYMPH NODE (1/1) -  SEE COMMENT  p16 IHC positive. EBV ISH negative.     04/22/2018 Pathology Results    (Accession: [id number]) Tonsil, biopsy, left, base of tongue - INVASIVE KERATINIZING SQUAMOUS CELL CARCINOMA.    04/29/2018 Imaging    PET:  IMPRESSION: 1. Left palatine tonsil/adjacent pharyngeal mucosal space lesion is asymmetric, measures about 1.3 cm in long axis, and has a maximum SUV of 8.2, compatible with malignancy. There is associated hypermetabolic left level IIa adenopathy as shown on prior exams. 2. Symmetric accentuated activity in the lymphoid tissue at the tongue base, maximum SUV 5.3, quite likely physiologic given the symmetry.  3. No findings of metastatic disease Pred to the chest, abdomen/pelvis, or skeleton.    05/26/2018 Surgery    Radical tonsillectomy    05/26/2018 Pathology Results    PROCEDURE: Radical tonsillectomy TUMOR SITE: Glossotonsillar sulcus TUMOR LATERALITY: LEFT TUMOR FOCALITY: Unifocal TUMOR SIZE: GREATEST DIMENSION: 1.6 cm HISTOLOGIC TYPE: HPV-mediated squamous cell carcinoma MARGINS: Involved by invasive tumor (focal) Specify location of closest margin: Tongue base (soft tissue) Note: Although an additional tongue base mucosal margin was submitted, location of tumor extension to margin appears 1 cm from the mucosal surface. LYMPHOVASCULAR INVASION: Not identified PERINEURAL INVASION: Present REGIONAL LYMPH NODES:  NUMBER OF LYMPH NODES INVOLVED: 1  NUMBER OF LYMPH NODES EXAMINED: 6  LATERALITY OF LYMPH NODES INVOLVED: Ipsilateral  SIZE OF LARGEST METASTATIC DEPOSIT: 6.8 cm  EXTRANODAL EXTENSION: Present   Distance from lymph node capsule: 6 millimeters PATHOLOGIC STAGE CLASSIFICATION (pTNM, AJCC 8TH Ed): pT1 pN1 (HPV-mediated oropharynx tumor)  pT1: Tumor 2 cm or smaller in greatest dimension  pN1: Metastasis in 4 or fewer lymph nodes ANCILLARY STUDIES:  p16 expression  by immunohistochemistry: Positive  HPV by PCR:  pending    05/26/2018 Cancer Staging    Staging form: Pharynx - HPV-Mediated Oropharynx, AJCC 8th Edition - Pathologic stage from 05/26/2018: Stage I (pT1, pN1, cM0, p16+) - Signed by Tish Men, MD on 06/18/2018    07/10/2018 - 07/10/2018 Chemotherapy    The patient had palonosetron (ALOXI) injection 0.25 mg, 0.25 mg, Intravenous,  Once, 0 of 3 cycles CISplatin (PLATINOL) 198 mg in sodium chloride 0.9 % 500 mL chemo infusion, 100 mg/m2 = 198 mg, Intravenous,  Once, 0 of 3 cycles fosaprepitant (EMEND) 150 mg, dexamethasone (DECADRON) 12 mg in sodium chloride 0.9 % 145 mL IVPB, , Intravenous,  Once, 0 of 3 cycles  for chemotherapy treatment.     07/10/2018 -  Chemotherapy    The patient had palonosetron (ALOXI) injection 0.25 mg, 0.25 mg, Intravenous,  Once, 4 of 7 cycles Administration: 0.25 mg (07/10/2018), 0.25 mg (07/18/2018), 0.25 mg (07/24/2018), 0.25 mg (07/31/2018) CISplatin (PLATINOL) 79 mg in sodium chloride 0.9 % 250 mL chemo infusion, 40 mg/m2 = 79 mg, Intravenous,  Once, 4 of 7 cycles Administration: 79 mg (07/10/2018), 79 mg (07/18/2018), 79 mg (07/24/2018), 79 mg (07/31/2018) fosaprepitant (EMEND) 150 mg, dexamethasone (DECADRON) 12 mg in sodium chloride 0.9 % 145 mL IVPB, , Intravenous,  Once, 4 of 7 cycles Administration:  (07/10/2018),  (07/18/2018),  (07/24/2018),  (07/31/2018)  for chemotherapy treatment.      Malignant neoplasm of base of tongue ([place])   05/09/2018 Initial Diagnosis    Malignant neoplasm of base of tongue ([place])    05/09/2018 Cancer Staging    Staging form: Pharynx - HPV-Mediated Oropharynx, AJCC 8th Edition - Clinical: Stage I (cT1, cN1, cM0, p16+) - Signed by Eppie Gibson, MD on 05/09/2018    06/11/2018 Cancer Staging    Staging form: Pharynx - HPV-Mediated Oropharynx, AJCC 8th Edition - Pathologic: Stage I (pT1, pN1, cM0, p16+) - Signed by Eppie Gibson, MD on 06/11/2018     REVIEW OF SYSTEMS:   Constitutional: ( - ) fevers, ( - )  chills , ( - ) night sweats Eyes:  ( - ) blurriness of vision, ( - ) double vision, ( - ) watery eyes Ears, nose, mouth, throat, and face: ( - ) mucositis, ( - ) sore throat Respiratory: ( - ) cough, ( - ) dyspnea, ( - ) wheezes Cardiovascular: ( - ) palpitation, ( - ) chest discomfort, ( - ) lower extremity swelling Gastrointestinal:  ( - ) nausea, ( - ) heartburn, ( - ) change in bowel habits Skin: ( - ) abnormal skin rashes Lymphatics: ( - ) new lymphadenopathy, ( - ) easy bruising Neurological: ( - ) numbness, ( - ) tingling, ( - ) new weaknesses Behavioral/Psych: ( - ) mood change, ( - ) new changes  All other systems were reviewed with the patient and are negative.  I have reviewed the past medical history, past surgical history, social history and family history with the patient and they are unchanged from previous note.  ALLERGIES:  has No Known Allergies.  MEDICATIONS:  Current Outpatient Medications  Medication Sig Dispense Refill  . Cholecalciferol (VITAMIN D3) 50 MCG (2000 UT) TABS Take 2,000 Units by mouth daily.    . clotrimazole (MYCELEX) 10 MG troche Take 1 lozenge (10 mg total) by mouth 5 (five) times daily for 30 days. 150 lozenge 1  . cyclobenzaprine (FLEXERIL) 10 MG tablet Take 1 tablet (10 mg total) by mouth 3 (three)  times daily as needed for up to 30 days (for hiccups). 60 tablet 2  . dexamethasone (DECADRON) 4 MG tablet Take 2 tablets by mouth once a day on the day after chemotherapy and then take 2 tablets two times a day for 2 days. Take with food. (Patient not taking: Reported on 07/23/2018) 30 tablet 1  . famotidine (PEPCID) 20 MG tablet Take 20 mg by mouth daily as needed for heartburn or indigestion.    . fluconazole (DIFLUCAN) 100 MG tablet Take 2 tablets today, then 1 tablet daily x 20 more days. 22 tablet 0  . GLUCOSAMINE-CHONDROITIN PO Take 1 tablet by mouth daily.    Marland Kitchen lidocaine (XYLOCAINE) 2 % solution Patient: Mix 1part 2% viscous lidocaine, 1part H20. Swish & swallow 70mL of diluted  mixture, 102min before meals and at bedtime, up to QID 100 mL 5  . lidocaine-prilocaine (EMLA) cream Apply to affected area once 30 g 3  . LORazepam (ATIVAN) 0.5 MG tablet Take 1 tablet (0.5 mg total) by mouth every 6 (six) hours as needed (Nausea or vomiting). 30 tablet 0  . Magnesium 500 MG TABS Take 500 mg by mouth daily.    Marland Kitchen morphine (MSIR) 15 MG tablet Take 1 tablet (15 mg total) by mouth every 4 (four) hours as needed for severe pain. 40 tablet 0  . Nutritional Supplements (FEEDING SUPPLEMENT, OSMOLITE 1.5 CAL,) LIQD Give 1.5 bottles Osmolite 1.5 Bid via PEG and 2 bottles Osmolite 1.5 via PEG for total of 7 bottles daily.  Give 60 cc free water before and after bolus feedings 4 times daily.  In addition consume or give via PEG 240 mL free water 3 times daily.  Please send formula and split gauze to patient.  He does not need other supplies at this time. 7 Bottle 0  . ondansetron (ZOFRAN) 8 MG tablet Take 1 tablet (8 mg total) by mouth 2 (two) times daily as needed. Start on the third day after chemotherapy. 30 tablet 1  . oxyCODONE (OXY IR/ROXICODONE) 5 MG immediate release tablet PLEASE SEE ATTACHED FOR DETAILED DIRECTIONS    . phenazopyridine (PYRIDIUM) 200 MG tablet Take 1 tablet (200 mg total) by mouth 3 (three) times daily as needed for pain. (Patient not taking: Reported on 07/23/2018) 10 tablet 0  . pramipexole (MIRAPEX) 0.5 MG tablet     . prochlorperazine (COMPAZINE) 10 MG tablet Take 1 tablet (10 mg total) by mouth every 6 (six) hours as needed (Nausea or vomiting). (Patient not taking: Reported on 07/23/2018) 30 tablet 1  . sodium fluoride (PREVIDENT 5000 PLUS) 1.1 % CREA dental cream Apply to tooth brush. Brush teeth for 2 minutes. Spit out excess. DO NOT rinse afterwards. Repeat nightly. 1 Tube prn   No current facility-administered medications for this visit.     PHYSICAL EXAMINATION: ECOG PERFORMANCE STATUS: 1 - Symptomatic but completely ambulatory  Today's Vitals   08/06/18  1436 08/06/18 1448  BP: 118/85 118/85  Pulse: 97 97  Resp: 18 18  Temp: 98.3 F (36.8 C) 98.3 F (36.8 C)  TempSrc: Oral Oral  SpO2: 99% 99%  Weight: 176 lb 3.2 oz (79.9 kg) 176 lb 3.2 oz (79.9 kg)  Height: 5\' 8"  (1.727 m) 5\' 8"  (1.727 m)   Body mass index is 26.79 kg/m.  Filed Weights   08/06/18 1436 08/06/18 1448  Weight: 176 lb 3.2 oz (79.9 kg) 176 lb 3.2 oz (79.9 kg)    GENERAL: alert, no distress and comfortable SKIN: skin color, texture,  turgor are normal, no rashes or significant lesions EYES: conjunctiva are pink and non-injected, sclera clear OROPHARYNX: mild erythema in the posterior oropharynx, consistent with Grade 1 mucositis; no evidence of recurrent oral candidiasis NECK: left neck dissection scar healing well LYMPH:  no palpable lymphadenopathy in the cervical LUNGS: clear to auscultation with normal breathing effort HEART: regular rate & rhythm and no murmurs and no lower extremity edema ABDOMEN: soft, non-tender, non-distended, normal bowel sounds Musculoskeletal: no cyanosis of digits and no clubbing  PSYCH: alert & oriented x 3, fluent speech  LABORATORY DATA:  I have reviewed the data as listed    Component Value Date/Time   NA 134 (L) 08/06/2018 1355   K 4.3 08/06/2018 1355   CL 98 08/06/2018 1355   CO2 27 08/06/2018 1355   GLUCOSE 298 (H) 08/06/2018 1355   BUN 23 (H) 08/06/2018 1355   CREATININE 0.84 08/06/2018 1355   CALCIUM 8.4 (L) 08/06/2018 1355   PROT 6.4 (L) 07/23/2018 1200   ALBUMIN 3.9 07/23/2018 1200   AST 15 07/23/2018 1200   AST 17 06/18/2018 1105   ALT 32 07/23/2018 1200   ALT 38 06/18/2018 1105   ALKPHOS 76 07/23/2018 1200   BILITOT 0.3 07/23/2018 1200   BILITOT 0.4 06/18/2018 1105   GFRNONAA >60 08/06/2018 1355   GFRAA >60 08/06/2018 1355    No results found for: SPEP, UPEP  Lab Results  Component Value Date   WBC 6.1 08/06/2018   NEUTROABS 5.1 08/06/2018   HGB 13.5 08/06/2018   HCT 40.0 08/06/2018   MCV 93.7  08/06/2018   PLT 139 (L) 08/06/2018      Chemistry      Component Value Date/Time   NA 134 (L) 08/06/2018 1355   K 4.3 08/06/2018 1355   CL 98 08/06/2018 1355   CO2 27 08/06/2018 1355   BUN 23 (H) 08/06/2018 1355   CREATININE 0.84 08/06/2018 1355      Component Value Date/Time   CALCIUM 8.4 (L) 08/06/2018 1355   ALKPHOS 76 07/23/2018 1200   AST 15 07/23/2018 1200   AST 17 06/18/2018 1105   ALT 32 07/23/2018 1200   ALT 38 06/18/2018 1105   BILITOT 0.3 07/23/2018 1200   BILITOT 0.4 06/18/2018 1105

## 2018-08-06 ENCOUNTER — Inpatient Hospital Stay (HOSPITAL_BASED_OUTPATIENT_CLINIC_OR_DEPARTMENT_OTHER): Payer: Commercial Managed Care - PPO | Admitting: Hematology

## 2018-08-06 ENCOUNTER — Ambulatory Visit
Admission: RE | Admit: 2018-08-06 | Discharge: 2018-08-06 | Disposition: A | Discharge status: Home / Self Care | Payer: Commercial Managed Care - PPO | Source: Ambulatory Visit | Attending: Radiation Oncology | Admitting: Radiation Oncology

## 2018-08-06 ENCOUNTER — Inpatient Hospital Stay: Payer: Commercial Managed Care - PPO

## 2018-08-06 ENCOUNTER — Encounter: Payer: Self-pay | Admitting: Hematology

## 2018-08-06 ENCOUNTER — Other Ambulatory Visit: Payer: Self-pay

## 2018-08-06 VITALS — BP 118/85 | HR 97 | Temp 98.3°F | Resp 18 | Ht 68.0 in | Wt 176.2 lb

## 2018-08-06 DIAGNOSIS — C099 Malignant neoplasm of tonsil, unspecified: Secondary | ICD-10-CM

## 2018-08-06 DIAGNOSIS — T451X5A Adverse effect of antineoplastic and immunosuppressive drugs, initial encounter: Secondary | ICD-10-CM

## 2018-08-06 DIAGNOSIS — C01 Malignant neoplasm of base of tongue: Secondary | ICD-10-CM | POA: Diagnosis not present

## 2018-08-06 DIAGNOSIS — H9319 Tinnitus, unspecified ear: Secondary | ICD-10-CM

## 2018-08-06 DIAGNOSIS — K1231 Oral mucositis (ulcerative) due to antineoplastic therapy: Secondary | ICD-10-CM

## 2018-08-06 DIAGNOSIS — H903 Sensorineural hearing loss, bilateral: Secondary | ICD-10-CM

## 2018-08-06 DIAGNOSIS — K5903 Drug induced constipation: Secondary | ICD-10-CM

## 2018-08-06 DIAGNOSIS — R110 Nausea: Secondary | ICD-10-CM

## 2018-08-06 DIAGNOSIS — Z931 Gastrostomy status: Secondary | ICD-10-CM

## 2018-08-06 DIAGNOSIS — T451X5S Adverse effect of antineoplastic and immunosuppressive drugs, sequela: Secondary | ICD-10-CM

## 2018-08-06 DIAGNOSIS — Z95828 Presence of other vascular implants and grafts: Secondary | ICD-10-CM

## 2018-08-06 DIAGNOSIS — G2581 Restless legs syndrome: Secondary | ICD-10-CM

## 2018-08-06 DIAGNOSIS — T402X5S Adverse effect of other opioids, sequela: Secondary | ICD-10-CM

## 2018-08-06 DIAGNOSIS — Z79899 Other long term (current) drug therapy: Secondary | ICD-10-CM

## 2018-08-06 DIAGNOSIS — E46 Unspecified protein-calorie malnutrition: Secondary | ICD-10-CM

## 2018-08-06 DIAGNOSIS — B370 Candidal stomatitis: Secondary | ICD-10-CM

## 2018-08-06 DIAGNOSIS — H9190 Unspecified hearing loss, unspecified ear: Secondary | ICD-10-CM

## 2018-08-06 LAB — BASIC METABOLIC PANEL - CANCER CENTER ONLY
Anion gap: 9 (ref 5–15)
BUN: 23 mg/dL — ABNORMAL HIGH (ref 6–20)
CO2: 27 mmol/L (ref 22–32)
CREATININE: 0.84 mg/dL (ref 0.61–1.24)
Calcium: 8.4 mg/dL — ABNORMAL LOW (ref 8.9–10.3)
Chloride: 98 mmol/L (ref 98–111)
GFR, Est AFR Am: >60 mL/min (ref >60)
GFR, Estimated: >60 mL/min (ref >60)
Glucose, Bld: 298 mg/dL — ABNORMAL HIGH (ref 70–99)
Potassium: 4.3 mmol/L (ref 3.5–5.1)
Sodium: 134 mmol/L — ABNORMAL LOW (ref 135–145)

## 2018-08-06 LAB — CBC WITH DIFFERENTIAL (CANCER CENTER ONLY)
Abs Immature Granulocytes: 0.07 10*3/uL (ref 0.00–0.07)
Basophils Absolute: 0 10*3/uL (ref 0.0–0.1)
Basophils Relative: 0 %
Eosinophils Absolute: 0 10*3/uL (ref 0.0–0.5)
Eosinophils Relative: 0 %
HCT: 40 % (ref 39.0–52.0)
Hemoglobin: 13.5 g/dL (ref 13.0–17.0)
Immature Granulocytes: 1 %
Lymphocytes Relative: 6 %
Lymphs Abs: 0.4 10*3/uL — ABNORMAL LOW (ref 0.7–4.0)
MCH: 31.6 pg (ref 26.0–34.0)
MCHC: 33.8 g/dL (ref 30.0–36.0)
MCV: 93.7 fL (ref 80.0–100.0)
Monocytes Absolute: 0.6 10*3/uL (ref 0.1–1.0)
Monocytes Relative: 10 %
Neutro Abs: 5.1 10*3/uL (ref 1.7–7.7)
Neutrophils Relative %: 83 %
Platelet Count: 139 10*3/uL — ABNORMAL LOW (ref 150–400)
RBC: 4.27 MIL/uL (ref 4.22–5.81)
RDW: 13.2 % (ref 11.5–15.5)
WBC Count: 6.1 10*3/uL (ref 4.0–10.5)
nRBC: 0 % (ref 0.0–0.2)

## 2018-08-06 LAB — MAGNESIUM: Magnesium: 1.8 mg/dL (ref 1.7–2.4)

## 2018-08-06 MED ORDER — SODIUM CHLORIDE 0.9% FLUSH
10.0000 mL | INTRAVENOUS | Status: DC | PRN
  Administered 2018-08-06: 10 mL
  Filled 2018-08-06: qty 10

## 2018-08-06 MED ORDER — HEPARIN SOD (PORK) LOCK FLUSH 100 UNIT/ML IV SOLN
500.0000 [IU] | Freq: Once | INTRAVENOUS | Status: AC | PRN
  Administered 2018-08-06: 500 [IU]
  Filled 2018-08-06: qty 5

## 2018-08-07 ENCOUNTER — Inpatient Hospital Stay: Payer: Commercial Managed Care - PPO

## 2018-08-07 ENCOUNTER — Other Ambulatory Visit: Payer: Self-pay

## 2018-08-07 ENCOUNTER — Telehealth: Payer: Self-pay | Admitting: Hematology

## 2018-08-07 ENCOUNTER — Ambulatory Visit
Admission: RE | Admit: 2018-08-07 | Discharge: 2018-08-07 | Disposition: A | Discharge status: Home / Self Care | Payer: Commercial Managed Care - PPO | Source: Ambulatory Visit | Attending: Radiation Oncology | Admitting: Radiation Oncology

## 2018-08-07 ENCOUNTER — Inpatient Hospital Stay: Payer: Commercial Managed Care - PPO | Admitting: Nutrition

## 2018-08-07 VITALS — BP 123/87 | HR 97 | Temp 98.8°F | Resp 18

## 2018-08-07 DIAGNOSIS — C099 Malignant neoplasm of tonsil, unspecified: Secondary | ICD-10-CM | POA: Diagnosis not present

## 2018-08-07 MED ORDER — SODIUM CHLORIDE 0.9 % IV SOLN
Freq: Once | INTRAVENOUS | Status: AC
  Administered 2018-08-07: 12:00:00 via INTRAVENOUS
  Filled 2018-08-07: qty 5

## 2018-08-07 MED ORDER — POTASSIUM CHLORIDE 2 MEQ/ML IV SOLN
Freq: Once | INTRAVENOUS | Status: AC
  Administered 2018-08-07: 09:00:00 via INTRAVENOUS
  Filled 2018-08-07: qty 10

## 2018-08-07 MED ORDER — PALONOSETRON HCL INJECTION 0.25 MG/5ML
0.2500 mg | Freq: Once | INTRAVENOUS | Status: AC
  Administered 2018-08-07: 0.25 mg via INTRAVENOUS

## 2018-08-07 MED ORDER — SODIUM CHLORIDE 0.9% FLUSH
10.0000 mL | INTRAVENOUS | Status: DC | PRN
  Administered 2018-08-07: 10 mL
  Filled 2018-08-07: qty 10

## 2018-08-07 MED ORDER — SODIUM CHLORIDE 0.9 % IV SOLN
40.0000 mg/m2 | Freq: Once | INTRAVENOUS | Status: AC
  Administered 2018-08-07: 79 mg via INTRAVENOUS
  Filled 2018-08-07: qty 79

## 2018-08-07 MED ORDER — HEPARIN SOD (PORK) LOCK FLUSH 100 UNIT/ML IV SOLN
500.0000 [IU] | Freq: Once | INTRAVENOUS | Status: AC | PRN
  Administered 2018-08-07: 500 [IU]
  Filled 2018-08-07: qty 5

## 2018-08-07 MED ORDER — PALONOSETRON HCL INJECTION 0.25 MG/5ML
INTRAVENOUS | Status: AC
  Filled 2018-08-07: qty 5

## 2018-08-07 MED ORDER — SODIUM CHLORIDE 0.9 % IV SOLN
Freq: Once | INTRAVENOUS | Status: AC
  Administered 2018-08-07: 09:00:00 via INTRAVENOUS
  Filled 2018-08-07: qty 250

## 2018-08-07 NOTE — Progress Notes (Signed)
Nutrition follow-up completed with patient during infusion for left base of tonsil cancer. Weight decreased to 176.2 pounds March 18 from 178.8 pounds March 11. Patient continues to complain about taste alterations. Reports he has increased Osmolite 1.5-6 bottles daily. He uses 1-1/2 bottles with 14 ounces of water at each feeding. Denies problems with tube feeding tolerance. Reports nausea is controlled with Zofran and Ativan. Noted hiccups. Patient appears to still be tolerating some soft foods such as fish and green beans. Patient reports tube is flushing well and he has adequate tube feeding supplies and formula. Noted labs: Sodium 134, glucose 298, and BUN 23.  Nutrition diagnosis: Inadequate oral intake continues.  Estimated nutrition needs: 2200-2400 cal, 100-125 g protein, 2.4 L fluid.  Intervention: Tube feeding plus free water flushes provides 2130 cal, 89.4 g protein, 2766 mL free water. Have encouraged patient to try to increase Osmolite 1.5-7 bottles daily. Have also encourage patient to continue soft solid foods as tolerated along with liquids. Enforced education regarding mucositis. Questions were answered.  Teach back method used.  Monitoring, evaluation, goals: Patient will tolerate tube feeding plus oral intake to meet 90% minimum nutrition needs.  Next visit: Scheduled for Thursday, March 26 during infusion.  **Disclaimer: This note was dictated with voice recognition software. Similar sounding words can inadvertently be transcribed and this note may contain transcription errors which may not have been corrected upon publication of note.**

## 2018-08-07 NOTE — Patient Instructions (Signed)
[facility] Discharge Instructions for Patients Receiving Chemotherapy  Today you received the following chemotherapy agents: Cisplatin  To help prevent nausea and vomiting after your treatment, we encourage you to take your nausea medication  as prescribed.    If you develop nausea and vomiting that is not controlled by your nausea medication, call the clinic.   BELOW ARE SYMPTOMS THAT SHOULD BE REPORTED IMMEDIATELY:  *FEVER GREATER THAN 100.5 F  *CHILLS WITH OR WITHOUT FEVER  NAUSEA AND VOMITING THAT IS NOT CONTROLLED WITH YOUR NAUSEA MEDICATION  *UNUSUAL SHORTNESS OF BREATH  *UNUSUAL BRUISING OR BLEEDING  TENDERNESS IN MOUTH AND THROAT WITH OR WITHOUT PRESENCE OF ULCERS  *URINARY PROBLEMS  *BOWEL PROBLEMS  UNUSUAL RASH Items with * indicate a potential emergency and should be followed up as soon as possible.  Feel free to call the clinic should you have any questions or concerns. The clinic phone number is [phone].  Please show the CHEMO ALERT CARD at check-in to the Emergency Department and triage nurse.   

## 2018-08-07 NOTE — Telephone Encounter (Signed)
No change per los  °

## 2018-08-08 ENCOUNTER — Ambulatory Visit
Admission: RE | Admit: 2018-08-08 | Discharge: 2018-08-08 | Disposition: A | Discharge status: Home / Self Care | Payer: Commercial Managed Care - PPO | Source: Ambulatory Visit | Attending: Radiation Oncology | Admitting: Radiation Oncology

## 2018-08-08 DIAGNOSIS — C099 Malignant neoplasm of tonsil, unspecified: Secondary | ICD-10-CM | POA: Diagnosis not present

## 2018-08-10 NOTE — Progress Notes (Signed)
[place] OFFICE PROGRESS NOTE  Patient Care Team: [name], MD as PCP - General (Family Medicine) Melissa Montane, MD as Consulting Physician (Otolaryngology) Eppie Gibson, MD as Attending Physician (Radiation Oncology) Tish Men, MD as Consulting Physician (Hematology) Leota Sauers, RN as Oncology Nurse Navigator [place], [place], [place] as Speech Language Pathologist (Speech Pathology) Karie Mainland, RD as Dietitian (Nutrition) Wynelle Beckmann, Melodie Bouillon, PT as Physical Therapist (Physical Therapy) Kennith Center, LCSW as Social Worker Francina Ames, MD as Referring Physician (Otolaryngology)  HEME/ONC OVERVIEW: 1. Stage I (pT1pN1M0) left tonsil SCCa, p16+ -02/2018: CT neck showed enlarged left Level II and II LN with central necrosis (largest 2.4cm); effacement of the left IJ vein noted  -03/2018: US-guided bx of left cervical LN; path showed SCCa, p16+ -04/2018: PET showed FDG-avid left palatine tonsil lesion measuring ~1.3cm with associated FDG-avid left Level IIa LN's; no evidence of metastatic disease -05/2018: TORS by [facility] Raphael Gibney at [facility]; path showed HPV-positive SCCa of the left glossotonsillar sulcus, 1.6cm, focal margin positive, PNI+, 1/6 LN's positive (largest 6.8cm) with ECE; pT1pN1  -06/2018 - present: adjuvant chemoradiation with weekly cisplatin  2. Port and PEG placement in 06/2018   TREATMENT SUMMARY:  05/26/2018: TORS by Dr. Nicolette Bang at [facility]   07/07/2018 - present: adjuvant chemoradiation with weekly cisplatin 40mg /m2 (due to moderate hearing loss)  ASSESSMENT & PLAN:   Stage I (cT1N1M0) left tonsil SCCa, p16+ -S/p 5 cycles of weekly cisplatin with concurrent RT in the adjuvant setting  -Labs adequate today, proceed with Cycle 6 of weekly cisplatin -See discussion regarding the management of tinnitus below -Plan for a total of 6 doses of weekly cisplatin -PRN anti-emetics: Zofran, Ativan, and dexamethasone; no  Compazine due to RLS exacerbation  Chemotherapy-associated anemia -Secondary to chemotherapy -Hgb 12.9, slightly lower than last week -Patient denies any symptom of bleeding -We will monitor for now; no indication for dose adjustment  Chemotherapy-associated thrombocytopenia -Secondary to chemotherapy -Plts 98k, slightly lower than last week -Patient denies any symptoms of bleeding or excess bruising, such as epistaxis, hematochezia, melena, or hematuria -We will monitor for now; no indication for dose adjustment  Chemotherapy-associated tinnitus -Secondary to cisplatin -Baseline audiology testing showed moderate hearing impairment -Since starting cisplatin, patient has developed mild persistent tinnitus with periodic exacerbations, not interfering with hearing  -Patient expressed his concern about the possibility of long-term hearing impairment -I discussed with him at the last visit that the cumulative dose of 200mg /m2 cisplatin appears to be beneficial; furthermore, as he is receiving chemotherapy in the adjuvant setting, it is likely not as crucial as in the definitive setting, albeit there is no definitive data to detail the benefits of additional treatment -After lengthy discussions, patient elected to proceed with Cycle 6 of treatment, but forego Cycle 7, recognizing that this may lead to a decline in treatment efficacy, but the degree of decline is likely marginal -We will plan to repeat audiology testing at the end of treatment to assess for any interim changes in hearing   Chemotherapy-associated nausea  -Secondary to chemotherapy -Symptoms relatively well controlled  -Continue PRN-anti-emetics   Chemotherapy-associated mucositis -Secondary to chemotherapy -Currently on IR morphine 15mg  q4hrs PRN and lidocaine swish and swallow -Pain relatively well controlled; continue regimen as above   Recent oral candidiasis -S/p 3 week course of PO fluconazole -PRN clotrimazole  lozenges   Protein malnutrition -Secondary to chemoradiation -Weight down by 3 lbs since the last visit  -He is currently taking 6  cans of Osmolite per day -I encouraged the patient to maintain adequate PO intake as tolerated, and to adhere to nutritional recommendations   No orders of the defined types were placed in this encounter.  All questions were answered. The patient knows to call the clinic with any problems, questions or concerns. No barriers to learning was detected.  Return in 1 week for labs, port flush and clinic appt. Cycle 7 tentatively to be omitted.   Tish Men, MD 08/13/2018 4:27 PM  CHIEF COMPLAINT: "My ears are still ringing"  INTERVAL HISTORY: Glenn Goodman return to clinic for follow-up of squamous cell carcinoma of the left tonsil on adjuvant chemoradiation.  Patient reports that he has mild to moderate persistent sore throat, for which he takes IR morphine at night and uses viscous lidocaine as needed during the day with relatively adequate control of his pain. He still has mild persistent tinnitus bilaterally at base, with periodic acute exacerbations occurring several times per day and each time lasting 10-20 seconds. Overall, his hearing remains unchanged, but he is slightly concerned that the episodes of acute exacerbation seem to be more intense. He is compliant with salt/soda rinses. He denies any neuropathy.   SUMMARY OF ONCOLOGIC HISTORY:   Squamous cell carcinoma of left tonsil ([place])   03/20/2018 Procedure    FNA of the left cervical LN in office    03/20/2018 Pathology Results    Non-diagnostic    04/07/2018 Procedure    US-guided left cervical LN biopsy/FNA     04/07/2018 Pathology Results    (Accession: [id number]) Lymph node, needle/core biopsy, left cervical - METASTATIC SQUAMOUS CELL CARCINOMA INVOLVING A LYMPH NODE (1/1) - SEE COMMENT  p16 IHC positive. EBV ISH negative.     04/22/2018 Pathology Results    (Accession:  [id number]) Tonsil, biopsy, left, base of tongue - INVASIVE KERATINIZING SQUAMOUS CELL CARCINOMA.    04/29/2018 Imaging    PET:  IMPRESSION: 1. Left palatine tonsil/adjacent pharyngeal mucosal space lesion is asymmetric, measures about 1.3 cm in long axis, and has a maximum SUV of 8.2, compatible with malignancy. There is associated hypermetabolic left level IIa adenopathy as shown on prior exams. 2. Symmetric accentuated activity in the lymphoid tissue at the tongue base, maximum SUV 5.3, quite likely physiologic given the symmetry.  3. No findings of metastatic disease Pred to the chest, abdomen/pelvis, or skeleton.    05/26/2018 Surgery    Radical tonsillectomy    05/26/2018 Pathology Results    PROCEDURE: Radical tonsillectomy TUMOR SITE: Glossotonsillar sulcus TUMOR LATERALITY: LEFT TUMOR FOCALITY: Unifocal TUMOR SIZE: GREATEST DIMENSION: 1.6 cm HISTOLOGIC TYPE: HPV-mediated squamous cell carcinoma MARGINS: Involved by invasive tumor (focal) Specify location of closest margin: Tongue base (soft tissue) Note: Although an additional tongue base mucosal margin was submitted, location of tumor extension to margin appears 1 cm from the mucosal surface. LYMPHOVASCULAR INVASION: Not identified PERINEURAL INVASION: Present REGIONAL LYMPH NODES:  NUMBER OF LYMPH NODES INVOLVED: 1  NUMBER OF LYMPH NODES EXAMINED: 6  LATERALITY OF LYMPH NODES INVOLVED: Ipsilateral  SIZE OF LARGEST METASTATIC DEPOSIT: 6.8 cm  EXTRANODAL EXTENSION: Present   Distance from lymph node capsule: 6 millimeters PATHOLOGIC STAGE CLASSIFICATION (pTNM, AJCC 8TH Ed): pT1 pN1 (HPV-mediated oropharynx tumor)  pT1: Tumor 2 cm or smaller in greatest dimension  pN1: Metastasis in 4 or fewer lymph nodes ANCILLARY STUDIES:  p16 expression by immunohistochemistry: Positive  HPV by PCR: pending    05/26/2018 Cancer Staging    Staging form: Pharynx - HPV-Mediated Oropharynx,  AJCC 8th  Edition - Pathologic stage from 05/26/2018: Stage I (pT1, pN1, cM0, p16+) - Signed by Tish Men, MD on 06/18/2018    07/10/2018 - 07/10/2018 Chemotherapy    The patient had palonosetron (ALOXI) injection 0.25 mg, 0.25 mg, Intravenous,  Once, 0 of 3 cycles CISplatin (PLATINOL) 198 mg in sodium chloride 0.9 % 500 mL chemo infusion, 100 mg/m2 = 198 mg, Intravenous,  Once, 0 of 3 cycles fosaprepitant (EMEND) 150 mg, dexamethasone (DECADRON) 12 mg in sodium chloride 0.9 % 145 mL IVPB, , Intravenous,  Once, 0 of 3 cycles  for chemotherapy treatment.     07/10/2018 -  Chemotherapy    The patient had palonosetron (ALOXI) injection 0.25 mg, 0.25 mg, Intravenous,  Once, 5 of 7 cycles Administration: 0.25 mg (07/10/2018), 0.25 mg (07/18/2018), 0.25 mg (07/24/2018), 0.25 mg (07/31/2018), 0.25 mg (08/07/2018) CISplatin (PLATINOL) 79 mg in sodium chloride 0.9 % 250 mL chemo infusion, 40 mg/m2 = 79 mg, Intravenous,  Once, 5 of 7 cycles Administration: 79 mg (07/10/2018), 79 mg (07/18/2018), 79 mg (07/24/2018), 79 mg (07/31/2018), 79 mg (08/07/2018) fosaprepitant (EMEND) 150 mg, dexamethasone (DECADRON) 12 mg in sodium chloride 0.9 % 145 mL IVPB, , Intravenous,  Once, 5 of 7 cycles Administration:  (07/10/2018),  (07/18/2018),  (07/24/2018),  (07/31/2018),  (08/07/2018)  for chemotherapy treatment.      Malignant neoplasm of base of tongue ([place])   05/09/2018 Initial Diagnosis    Malignant neoplasm of base of tongue ([place])    05/09/2018 Cancer Staging    Staging form: Pharynx - HPV-Mediated Oropharynx, AJCC 8th Edition - Clinical: Stage I (cT1, cN1, cM0, p16+) - Signed by Eppie Gibson, MD on 05/09/2018    06/11/2018 Cancer Staging    Staging form: Pharynx - HPV-Mediated Oropharynx, AJCC 8th Edition - Pathologic: Stage I (pT1, pN1, cM0, p16+) - Signed by Eppie Gibson, MD on 06/11/2018     REVIEW OF SYSTEMS:   Constitutional: ( - ) fevers, ( - )  chills , ( - ) night sweats Eyes: ( - ) blurriness of vision, ( - ) double  vision, ( - ) watery eyes Ears, nose, mouth, throat, and face: ( + ) mucositis, ( + ) sore throat Respiratory: ( - ) cough, ( - ) dyspnea, ( - ) wheezes Cardiovascular: ( - ) palpitation, ( - ) chest discomfort, ( - ) lower extremity swelling Gastrointestinal:  ( + ) nausea, ( - ) heartburn, ( - ) change in bowel habits Skin: ( - ) abnormal skin rashes Lymphatics: ( - ) new lymphadenopathy, ( - ) easy bruising Neurological: ( - ) numbness, ( - ) tingling, ( - ) new weaknesses Behavioral/Psych: ( - ) mood change, ( - ) new changes  All other systems were reviewed with the patient and are negative.  I have reviewed the past medical history, past surgical history, social history and family history with the patient and they are unchanged from previous note.  ALLERGIES:  has No Known Allergies.  MEDICATIONS:  Current Outpatient Medications  Medication Sig Dispense Refill  . clotrimazole (MYCELEX) 10 MG troche Take 1 lozenge (10 mg total) by mouth 5 (five) times daily for 30 days. 150 lozenge 1  . cyclobenzaprine (FLEXERIL) 10 MG tablet Take 1 tablet (10 mg total) by mouth 3 (three) times daily as needed for up to 30 days (for hiccups). 60 tablet 2  . famotidine (PEPCID) 20 MG tablet Take 20 mg by mouth daily as needed for heartburn or indigestion.    Marland Kitchen  lidocaine (XYLOCAINE) 2 % solution Patient: Mix 1part 2% viscous lidocaine, 1part H20. Swish & swallow 45mL of diluted mixture, 71min before meals and at bedtime, up to QID 100 mL 5  . lidocaine-prilocaine (EMLA) cream Apply to affected area once 30 g 3  . LORazepam (ATIVAN) 0.5 MG tablet Take 1 tablet (0.5 mg total) by mouth every 6 (six) hours as needed (Nausea or vomiting). 30 tablet 0  . morphine (MSIR) 15 MG tablet Take 1 tablet (15 mg total) by mouth every 4 (four) hours as needed for severe pain. 60 tablet 0  . Nutritional Supplements (FEEDING SUPPLEMENT, OSMOLITE 1.5 CAL,) LIQD Give 1.5 bottles Osmolite 1.5 Bid via PEG and 2 bottles  Osmolite 1.5 via PEG for total of 7 bottles daily.  Give 60 cc free water before and after bolus feedings 4 times daily.  In addition consume or give via PEG 240 mL free water 3 times daily.  Please send formula and split gauze to patient.  He does not need other supplies at this time. 7 Bottle 0  . ondansetron (ZOFRAN) 8 MG tablet Take 1 tablet (8 mg total) by mouth 2 (two) times daily as needed. Start on the third day after chemotherapy. 30 tablet 1  . pramipexole (MIRAPEX) 0.5 MG tablet Take 1 mg by mouth at bedtime.     . sodium fluoride (PREVIDENT 5000 PLUS) 1.1 % CREA dental cream Apply to tooth brush. Brush teeth for 2 minutes. Spit out excess. DO NOT rinse afterwards. Repeat nightly. 1 Tube prn  . Cholecalciferol (VITAMIN D3) 50 MCG (2000 UT) TABS Take 2,000 Units by mouth daily.    Marland Kitchen dexamethasone (DECADRON) 4 MG tablet Take 2 tablets by mouth once a day on the day after chemotherapy and then take 2 tablets two times a day for 2 days. Take with food. (Patient not taking: Reported on 07/23/2018) 30 tablet 1  . GLUCOSAMINE-CHONDROITIN PO Take 1 tablet by mouth daily.    . Magnesium 500 MG TABS Take 500 mg by mouth daily.    Marland Kitchen oxyCODONE (OXY IR/ROXICODONE) 5 MG immediate release tablet PLEASE SEE ATTACHED FOR DETAILED DIRECTIONS    . phenazopyridine (PYRIDIUM) 200 MG tablet Take 1 tablet (200 mg total) by mouth 3 (three) times daily as needed for pain. (Patient not taking: Reported on 07/23/2018) 10 tablet 0  . prochlorperazine (COMPAZINE) 10 MG tablet Take 1 tablet (10 mg total) by mouth every 6 (six) hours as needed (Nausea or vomiting). (Patient not taking: Reported on 07/23/2018) 30 tablet 1   No current facility-administered medications for this visit.     PHYSICAL EXAMINATION: ECOG PERFORMANCE STATUS: 1 - Symptomatic but completely ambulatory  Today's Vitals   08/13/18 1448 08/13/18 1501  BP: 125/83   Pulse: (!) 104   Resp: 18   Temp: 98.2 F (36.8 C)   TempSrc: Oral   SpO2: 93%    Weight: 173 lb 11.2 oz (78.8 kg)   Height: 5\' 8"  (1.727 m)   PainSc:  3    Body mass index is 26.41 kg/m.  Filed Weights   08/13/18 1448  Weight: 173 lb 11.2 oz (78.8 kg)    GENERAL: alert, no distress and comfortable SKIN: dark erythematous skin over the neck area secondary to radiation  EYES: conjunctiva are pink and non-injected, sclera clear OROPHARYNX: Grade 1 mucositis, no recurrent oral candidiasis  NECK: supple, non-tender, surgical site well healed, mild neck firmness  LYMPH:  no palpable lymphadenopathy in the cervical LUNGS: clear to  auscultation with normal breathing effort HEART: regular rate & rhythm and no murmurs and no lower extremity edema ABDOMEN: soft, non-tender, non-distended, normal bowel sounds Musculoskeletal: no cyanosis of digits and no clubbing  PSYCH: alert & oriented x 3, fluent speech NEURO: no focal motor/sensory deficits  LABORATORY DATA:  I have reviewed the data as listed    Component Value Date/Time   NA 135 08/13/2018 1425   K 4.3 08/13/2018 1425   CL 97 (L) 08/13/2018 1425   CO2 25 08/13/2018 1425   GLUCOSE 545 (H) 08/13/2018 1425   BUN 25 (H) 08/13/2018 1425   CREATININE 0.97 08/13/2018 1425   CALCIUM 8.7 (L) 08/13/2018 1425   PROT 6.4 (L) 07/23/2018 1200   ALBUMIN 3.9 07/23/2018 1200   AST 15 07/23/2018 1200   AST 17 06/18/2018 1105   ALT 32 07/23/2018 1200   ALT 38 06/18/2018 1105   ALKPHOS 76 07/23/2018 1200   BILITOT 0.3 07/23/2018 1200   BILITOT 0.4 06/18/2018 1105   GFRNONAA >60 08/13/2018 1425   GFRAA >60 08/13/2018 1425    No results found for: SPEP, UPEP  Lab Results  Component Value Date   WBC 4.3 08/13/2018   NEUTROABS 3.6 08/13/2018   HGB 12.9 (L) 08/13/2018   HCT 37.8 (L) 08/13/2018   MCV 93.6 08/13/2018   PLT 98 (L) 08/13/2018      Chemistry      Component Value Date/Time   NA 135 08/13/2018 1425   K 4.3 08/13/2018 1425   CL 97 (L) 08/13/2018 1425   CO2 25 08/13/2018 1425   BUN 25 (H) 08/13/2018  1425   CREATININE 0.97 08/13/2018 1425      Component Value Date/Time   CALCIUM 8.7 (L) 08/13/2018 1425   ALKPHOS 76 07/23/2018 1200   AST 15 07/23/2018 1200   AST 17 06/18/2018 1105   ALT 32 07/23/2018 1200   ALT 38 06/18/2018 1105   BILITOT 0.3 07/23/2018 1200   BILITOT 0.4 06/18/2018 1105

## 2018-08-11 ENCOUNTER — Other Ambulatory Visit: Payer: Self-pay

## 2018-08-11 ENCOUNTER — Encounter: Payer: Self-pay | Admitting: *Deleted

## 2018-08-11 ENCOUNTER — Ambulatory Visit
Admission: RE | Admit: 2018-08-11 | Discharge: 2018-08-11 | Disposition: A | Discharge status: Home / Self Care | Payer: Commercial Managed Care - PPO | Source: Ambulatory Visit | Attending: Radiation Oncology | Admitting: Radiation Oncology

## 2018-08-11 DIAGNOSIS — C099 Malignant neoplasm of tonsil, unspecified: Secondary | ICD-10-CM | POA: Diagnosis not present

## 2018-08-12 ENCOUNTER — Ambulatory Visit
Admission: RE | Admit: 2018-08-12 | Discharge: 2018-08-12 | Disposition: A | Discharge status: Home / Self Care | Payer: Commercial Managed Care - PPO | Source: Ambulatory Visit | Attending: Radiation Oncology | Admitting: Radiation Oncology

## 2018-08-12 ENCOUNTER — Other Ambulatory Visit: Payer: Self-pay

## 2018-08-12 DIAGNOSIS — C099 Malignant neoplasm of tonsil, unspecified: Secondary | ICD-10-CM | POA: Diagnosis not present

## 2018-08-13 ENCOUNTER — Ambulatory Visit
Admission: RE | Admit: 2018-08-13 | Discharge: 2018-08-13 | Disposition: A | Discharge status: Home / Self Care | Payer: Commercial Managed Care - PPO | Source: Ambulatory Visit | Attending: Radiation Oncology | Admitting: Radiation Oncology

## 2018-08-13 ENCOUNTER — Inpatient Hospital Stay: Payer: Commercial Managed Care - PPO

## 2018-08-13 ENCOUNTER — Inpatient Hospital Stay (HOSPITAL_BASED_OUTPATIENT_CLINIC_OR_DEPARTMENT_OTHER): Payer: Commercial Managed Care - PPO | Admitting: Hematology

## 2018-08-13 ENCOUNTER — Encounter: Payer: Self-pay | Admitting: Hematology

## 2018-08-13 ENCOUNTER — Other Ambulatory Visit: Payer: Self-pay

## 2018-08-13 ENCOUNTER — Telehealth: Payer: Self-pay | Admitting: Hematology

## 2018-08-13 VITALS — BP 125/83 | HR 104 | Temp 98.2°F | Resp 18 | Ht 68.0 in | Wt 173.7 lb

## 2018-08-13 DIAGNOSIS — Z79899 Other long term (current) drug therapy: Secondary | ICD-10-CM

## 2018-08-13 DIAGNOSIS — T451X5S Adverse effect of antineoplastic and immunosuppressive drugs, sequela: Secondary | ICD-10-CM

## 2018-08-13 DIAGNOSIS — T451X5A Adverse effect of antineoplastic and immunosuppressive drugs, initial encounter: Secondary | ICD-10-CM

## 2018-08-13 DIAGNOSIS — Z931 Gastrostomy status: Secondary | ICD-10-CM

## 2018-08-13 DIAGNOSIS — R066 Hiccough: Secondary | ICD-10-CM

## 2018-08-13 DIAGNOSIS — R110 Nausea: Secondary | ICD-10-CM

## 2018-08-13 DIAGNOSIS — G2581 Restless legs syndrome: Secondary | ICD-10-CM

## 2018-08-13 DIAGNOSIS — D6959 Other secondary thrombocytopenia: Secondary | ICD-10-CM

## 2018-08-13 DIAGNOSIS — C01 Malignant neoplasm of base of tongue: Secondary | ICD-10-CM

## 2018-08-13 DIAGNOSIS — T402X5S Adverse effect of other opioids, sequela: Secondary | ICD-10-CM

## 2018-08-13 DIAGNOSIS — K1231 Oral mucositis (ulcerative) due to antineoplastic therapy: Secondary | ICD-10-CM

## 2018-08-13 DIAGNOSIS — C099 Malignant neoplasm of tonsil, unspecified: Secondary | ICD-10-CM | POA: Diagnosis not present

## 2018-08-13 DIAGNOSIS — H9319 Tinnitus, unspecified ear: Secondary | ICD-10-CM

## 2018-08-13 DIAGNOSIS — K5903 Drug induced constipation: Secondary | ICD-10-CM | POA: Diagnosis not present

## 2018-08-13 DIAGNOSIS — E46 Unspecified protein-calorie malnutrition: Secondary | ICD-10-CM

## 2018-08-13 DIAGNOSIS — D6481 Anemia due to antineoplastic chemotherapy: Secondary | ICD-10-CM

## 2018-08-13 DIAGNOSIS — H903 Sensorineural hearing loss, bilateral: Secondary | ICD-10-CM

## 2018-08-13 LAB — BASIC METABOLIC PANEL - CANCER CENTER ONLY
Anion gap: 13 (ref 5–15)
BUN: 25 mg/dL — ABNORMAL HIGH (ref 6–20)
CO2: 25 mmol/L (ref 22–32)
Calcium: 8.7 mg/dL — ABNORMAL LOW (ref 8.9–10.3)
Chloride: 97 mmol/L — ABNORMAL LOW (ref 98–111)
Creatinine: 0.97 mg/dL (ref 0.61–1.24)
GFR, Est AFR Am: >60 mL/min (ref >60)
GFR, Estimated: >60 mL/min (ref >60)
Glucose, Bld: 545 mg/dL — ABNORMAL HIGH (ref 70–99)
Potassium: 4.3 mmol/L (ref 3.5–5.1)
Sodium: 135 mmol/L (ref 135–145)

## 2018-08-13 LAB — CBC WITH DIFFERENTIAL (CANCER CENTER ONLY)
Abs Immature Granulocytes: 0.05 10*3/uL (ref 0.00–0.07)
Basophils Absolute: 0 10*3/uL (ref 0.0–0.1)
Basophils Relative: 1 %
EOS ABS: 0 10*3/uL (ref 0.0–0.5)
Eosinophils Relative: 0 %
HCT: 37.8 % — ABNORMAL LOW (ref 39.0–52.0)
Hemoglobin: 12.9 g/dL — ABNORMAL LOW (ref 13.0–17.0)
Immature Granulocytes: 1 %
Lymphocytes Relative: 5 %
Lymphs Abs: 0.2 10*3/uL — ABNORMAL LOW (ref 0.7–4.0)
MCH: 31.9 pg (ref 26.0–34.0)
MCHC: 34.1 g/dL (ref 30.0–36.0)
MCV: 93.6 fL (ref 80.0–100.0)
Monocytes Absolute: 0.4 10*3/uL (ref 0.1–1.0)
Monocytes Relative: 8 %
Neutro Abs: 3.6 10*3/uL (ref 1.7–7.7)
Neutrophils Relative %: 85 %
Platelet Count: 98 10*3/uL — ABNORMAL LOW (ref 150–400)
RBC: 4.04 MIL/uL — ABNORMAL LOW (ref 4.22–5.81)
RDW: 13.6 % (ref 11.5–15.5)
WBC Count: 4.3 10*3/uL (ref 4.0–10.5)
nRBC: 0 % (ref 0.0–0.2)

## 2018-08-13 LAB — MAGNESIUM: Magnesium: 1.7 mg/dL (ref 1.7–2.4)

## 2018-08-13 MED ORDER — MORPHINE SULFATE 15 MG PO TABS: 15.0000 mg | ORAL_TABLET | ORAL | 0 refills | Status: DC | PRN

## 2018-08-13 NOTE — Progress Notes (Signed)
Prior authorization # for MSIR  15 mg tab  # 60  Given to pt today by Dr. Maylon Peppers at office visit -   [place] [id number]  - obtained  08/13/2018.

## 2018-08-13 NOTE — Telephone Encounter (Signed)
Per 3/25 los, no change in appt.  Printed avs.

## 2018-08-14 ENCOUNTER — Inpatient Hospital Stay: Payer: Commercial Managed Care - PPO | Admitting: Nutrition

## 2018-08-14 ENCOUNTER — Other Ambulatory Visit: Payer: Self-pay

## 2018-08-14 ENCOUNTER — Ambulatory Visit
Admission: RE | Admit: 2018-08-14 | Discharge: 2018-08-14 | Disposition: A | Discharge status: Home / Self Care | Payer: Commercial Managed Care - PPO | Source: Ambulatory Visit | Attending: Radiation Oncology | Admitting: Radiation Oncology

## 2018-08-14 ENCOUNTER — Inpatient Hospital Stay: Payer: Commercial Managed Care - PPO

## 2018-08-14 VITALS — BP 131/87 | HR 98 | Temp 98.6°F | Resp 18

## 2018-08-14 DIAGNOSIS — C099 Malignant neoplasm of tonsil, unspecified: Secondary | ICD-10-CM | POA: Diagnosis not present

## 2018-08-14 MED ORDER — PALONOSETRON HCL INJECTION 0.25 MG/5ML
INTRAVENOUS | Status: AC
  Filled 2018-08-14: qty 5

## 2018-08-14 MED ORDER — POTASSIUM CHLORIDE 2 MEQ/ML IV SOLN
Freq: Once | INTRAVENOUS | Status: AC
  Administered 2018-08-14: 10:00:00 via INTRAVENOUS
  Filled 2018-08-14: qty 1000

## 2018-08-14 MED ORDER — SODIUM CHLORIDE 0.9 % IV SOLN
Freq: Once | INTRAVENOUS | Status: AC
  Administered 2018-08-14: 09:00:00 via INTRAVENOUS
  Filled 2018-08-14: qty 250

## 2018-08-14 MED ORDER — SODIUM CHLORIDE 0.9% FLUSH
10.0000 mL | INTRAVENOUS | Status: DC | PRN
  Administered 2018-08-14: 10 mL
  Filled 2018-08-14: qty 10

## 2018-08-14 MED ORDER — PALONOSETRON HCL INJECTION 0.25 MG/5ML
0.2500 mg | Freq: Once | INTRAVENOUS | Status: AC
  Administered 2018-08-14: 0.25 mg via INTRAVENOUS

## 2018-08-14 MED ORDER — SODIUM CHLORIDE 0.9 % IV SOLN
Freq: Once | INTRAVENOUS | Status: AC
  Administered 2018-08-14: 12:00:00 via INTRAVENOUS
  Filled 2018-08-14: qty 5

## 2018-08-14 MED ORDER — HEPARIN SOD (PORK) LOCK FLUSH 100 UNIT/ML IV SOLN
500.0000 [IU] | Freq: Once | INTRAVENOUS | Status: AC | PRN
  Administered 2018-08-14: 500 [IU]
  Filled 2018-08-14: qty 5

## 2018-08-14 MED ORDER — SODIUM CHLORIDE 0.9 % IV SOLN
40.0000 mg/m2 | Freq: Once | INTRAVENOUS | Status: AC
  Administered 2018-08-14: 79 mg via INTRAVENOUS
  Filled 2018-08-14: qty 79

## 2018-08-14 NOTE — Progress Notes (Signed)
RD working remotely.  Nutrition follow up completed with patient while he was receiving his final chemotherapy. He is scheduled to complete radiation next Wednesday, April 1. Weight decreased to 173.7 pounds March 25 from 176.2 pounds March 18. Noted glucose 545 yesterday. Reports he is tolerating 7 bottles of Osmolite 1.5 daily divided into 4 feedings with free water flushes. He has been able to eat a little soft food such as scrambled eggs and an Atkins shake. Patient denies diarrhea and constipation. He had some dry heaves this morning secondary to thick saliva. He is using baking soda and salt water rinses consistently.  Nutrition diagnosis:Inadequate oral intake continues.  Estimated Nutrition Needs: 2200-2400 kcal, 100-125 gm protein, 2.4 L fluid  Intervention: Educated patient to continue 7 bottles Osmolite 1.5 daily with 60 mL free water flush before and after bolus feeding. Encouraged patient to continue to swallow food and liquids as tolerated. TF + free water providing 2485 kcal, 104.3 grams protein, 2467 mL free water. This is 100% estimated needs. Continue baking soda and salt water rinses.  Monitoring, Evaluation, Goals: Patient will tolerate TF plus oral intake to minimize weight loss during treatment.  Next Visit: Thursday, April 2.

## 2018-08-14 NOTE — Patient Instructions (Signed)
[facility] Discharge Instructions for Patients Receiving Chemotherapy  Today you received the following chemotherapy agents: Cisplatin  To help prevent nausea and vomiting after your treatment, we encourage you to take your nausea medication  as prescribed.    If you develop nausea and vomiting that is not controlled by your nausea medication, call the clinic.   BELOW ARE SYMPTOMS THAT SHOULD BE REPORTED IMMEDIATELY:  *FEVER GREATER THAN 100.5 F  *CHILLS WITH OR WITHOUT FEVER  NAUSEA AND VOMITING THAT IS NOT CONTROLLED WITH YOUR NAUSEA MEDICATION  *UNUSUAL SHORTNESS OF BREATH  *UNUSUAL BRUISING OR BLEEDING  TENDERNESS IN MOUTH AND THROAT WITH OR WITHOUT PRESENCE OF ULCERS  *URINARY PROBLEMS  *BOWEL PROBLEMS  UNUSUAL RASH Items with * indicate a potential emergency and should be followed up as soon as possible.  Feel free to call the clinic should you have any questions or concerns. The clinic phone number is [phone].  Please show the CHEMO ALERT CARD at check-in to the Emergency Department and triage nurse.   

## 2018-08-15 ENCOUNTER — Ambulatory Visit
Admission: RE | Admit: 2018-08-15 | Discharge: 2018-08-15 | Disposition: A | Discharge status: Home / Self Care | Payer: Commercial Managed Care - PPO | Source: Ambulatory Visit | Attending: Radiation Oncology | Admitting: Radiation Oncology

## 2018-08-15 ENCOUNTER — Other Ambulatory Visit: Payer: Self-pay

## 2018-08-15 DIAGNOSIS — C099 Malignant neoplasm of tonsil, unspecified: Secondary | ICD-10-CM | POA: Diagnosis not present

## 2018-08-18 ENCOUNTER — Ambulatory Visit
Admission: RE | Admit: 2018-08-18 | Discharge: 2018-08-18 | Disposition: A | Discharge status: Home / Self Care | Payer: Commercial Managed Care - PPO | Source: Ambulatory Visit | Attending: Radiation Oncology | Admitting: Radiation Oncology

## 2018-08-18 ENCOUNTER — Other Ambulatory Visit: Payer: Self-pay

## 2018-08-18 ENCOUNTER — Telehealth: Payer: Self-pay | Admitting: Nutrition

## 2018-08-18 DIAGNOSIS — C099 Malignant neoplasm of tonsil, unspecified: Secondary | ICD-10-CM | POA: Diagnosis not present

## 2018-08-18 MED ORDER — OSMOLITE 1.5 CAL PO LIQD: ORAL | 0 refills | Status: DC

## 2018-08-18 MED ORDER — PROMOD PO LIQD: ORAL | 1 refills | Status: DC

## 2018-08-18 NOTE — Telephone Encounter (Signed)
RD working remotely.  Patient paged me with questions regarding TF. Reports he feels extreme fatigue and has lost all desire to eat by mouth. He denies Nausea, vomiting, diarrhea. Reports mild constipation on dulcolax. States he had a BM last night but it was firm. States weight has decreased to 160 pounds on home scale. Reports he increased Osmolite 1.5 to 8 bottles on his own and tolerated it without difficulty.  Completes radiation on Wednesday, April 1.  Revised Estimated Nutrition Needs: 0092 - 2750 kcal, 125-140 gm pro, 2.8 L fluid  Nutrition Diagnosis: Inadequate oral intake continues.  Intervention: Patient educated to continue Osmolite 1.5 at 7 bottles daily. Will add Promod TID to provide an additional 300 calories and 30 grams protein. 120 mL free water before and after bolus feedings 4 times daily. Give an additional 240 mL free water TID between feedings. TF + Promod will provide 2785 kcal, 134.3 grams protein, 2947 mL free water. Educated patient and wife. New orders written and home care notified.  Monitoring, Evaluation, Goals: Patient will tolerate TF and Promod to minimize weight loss and promote healing.  Next Visit: Patient will contact me with questions and concerns.

## 2018-08-18 NOTE — Progress Notes (Signed)
Oncology Nurse Navigator Documentation  In follow-up to patient's call earlier today expressing concern PEG retention ring is too loose because of recent weight loss, met with him following weekly PUT with Dr. Isidore Moos.    External assessment indicated retention ring sufficiently snug.  Upon removal of single split gauze, PEG site appeared WNL.  I redressed site with single split gauze, explained procedure for adjustment to RN Anderson Malta and Dr. Isidore Moos if needed in near future. I encouraged Glenn Goodman to call me with concerns/questions.  [name], RN, BSN Head & Neck Oncology Nurse [place] [facility] [place] [phone]

## 2018-08-19 ENCOUNTER — Ambulatory Visit
Admission: RE | Admit: 2018-08-19 | Discharge: 2018-08-19 | Disposition: A | Discharge status: Home / Self Care | Payer: Commercial Managed Care - PPO | Source: Ambulatory Visit | Attending: Radiation Oncology | Admitting: Radiation Oncology

## 2018-08-19 ENCOUNTER — Other Ambulatory Visit: Payer: Self-pay

## 2018-08-19 DIAGNOSIS — C099 Malignant neoplasm of tonsil, unspecified: Secondary | ICD-10-CM

## 2018-08-19 DIAGNOSIS — R739 Hyperglycemia, unspecified: Secondary | ICD-10-CM | POA: Diagnosis not present

## 2018-08-19 DIAGNOSIS — E1110 Type 2 diabetes mellitus with ketoacidosis without coma: Secondary | ICD-10-CM | POA: Diagnosis not present

## 2018-08-19 DIAGNOSIS — E860 Dehydration: Secondary | ICD-10-CM | POA: Diagnosis not present

## 2018-08-19 DIAGNOSIS — R55 Syncope and collapse: Secondary | ICD-10-CM | POA: Diagnosis not present

## 2018-08-19 MED ORDER — SILVER SULFADIAZINE 1 % EX CREA
TOPICAL_CREAM | Freq: Two times a day (BID) | CUTANEOUS | Status: DC
  Administered 2018-08-19: 17:00:00 via TOPICAL

## 2018-08-19 NOTE — Progress Notes (Signed)
[place] OFFICE PROGRESS NOTE  Patient Care Team: [name], MD as PCP - General (Family Medicine) Melissa Montane, MD as Consulting Physician (Otolaryngology) Eppie Gibson, MD as Attending Physician (Radiation Oncology) Tish Men, MD as Consulting Physician (Hematology) Leota Sauers, RN as Oncology Nurse Navigator [place], [place], [place] as Speech Language Pathologist (Speech Pathology) Karie Mainland, RD as Dietitian (Nutrition) Wynelle Beckmann, Melodie Bouillon, PT as Physical Therapist (Physical Therapy) Kennith Center, LCSW as Social Worker Francina Ames, MD as Referring Physician (Otolaryngology)  HEME/ONC OVERVIEW: 1. Stage I (pT1pN1M0) left tonsil SCCa, p16+ -02/2018: CT neck showed enlarged left Level II and II LN with central necrosis (largest 2.4cm); effacement of the left IJ vein noted  -03/2018: US-guided bx of left cervical LN; path showed SCCa, p16+ -04/2018: PET showed FDG-avid left palatine tonsil lesion measuring ~1.3cm with associated FDG-avid left Level IIa LN's; no evidence of metastatic disease -05/2018: TORS by Dr. Raphael Gibney at [facility]; path showed HPV-positive SCCa of the left glossotonsillar sulcus, 1.6cm, focal margin positive, PNI+, 1/6 LN's positive (largest 6.8cm) with ECE; pT1pN1  -06/2018 - present: adjuvant chemoradiation with weekly cisplatin  2. Port and PEG placement in 06/2018   TREATMENT SUMMARY:  05/26/2018: TORS by Dr. Nicolette Bang at [facility]   07/07/2018 - present: adjuvant chemoradiation with weekly cisplatin 40mg /m2 (due to moderate hearing loss)  ASSESSMENT & PLAN:   Severe hyperglycemia -Glucose 850 today; no hx of diabetes  -Patient reports that he has been adding a lot of sugar products in his drinks to help with taste, as his taste has been impaired by radiation -In light of AKI and severe hyperglycemia, I discussed the case with the ER physician and we will direct him to the ER for further management  AKI -Cr  1.6 today, likely secondary to dehydration from severe hyperglycemia -Patient will be transferred to the ER for further evaluation   Stage I (cT1N1M0) left tonsil SCCa, p16+ -S/p 6 cycles of weekly cisplatin concurrent with RT in the adjuvant setting -We discussed on several visits regarding persistent tinnitus with episodic exacerbations -As the patient has received at least 5 doses of cisplatin, and the treatment is given in the adjuvant instead of definitive setting, he likely has received the bulk of the benefit from chemotherapy -While there is no study to determine the loss of benefit from omitting the last dose of chemotherapy, the margin of benefit is likely small -Therefore, after lengthy discussions of some of the pros and cons, the patient decided to forego the last cycle of weekly cisplatin  -PRN anti-emetics: Zofran, Ativan and dexamethasone  Chemotherapy-associated anemia -Secondary to chemotherapy -Hgb 12.8, stable -Patient denies any symptom of bleeding -We will monitor for now  Chemotherapy-associated thrombocytopenia -Secondary to chemotherapy -Plts 129k, stable -Patient denies any symptoms of bleeding or excess bruising, such as epistaxis, hematochezia, melena, or hematuria -We will monitor for now  Chemotherapy-associated leukopenia -Secondary to chemotherapy -WBC 2.4k with ANC 2000, slightly lower than last week -Patient denies any symptoms of infection -We will monitor for now  Chemotherapy-associated tinnitus -Secondary to cisplatin -Patient has baseline moderate hearing loss, and interim audiology study did not show any changes -See discussion regarding the last cycle of cisplatin above -I have requested repeat audiology study to be done at the end of radiation to re-assess for any interval changes   Chemotherapy-associated nausea  -Secondary to chemotherapy -Symptoms relatively well controlled  -Continue PRN-anti-emetics   Protein  malnutrition -Secondary to chemoradiation -Weight down  by 10 lbs since the last visit, likely due to decreased intake and dehydration due to severe hyperglycemia  -He is currently taking 6 cans of Osmolite per day -I encouraged the patient to maintain adequate PO intake as tolerated, and to adhere to nutritional recommendations   Candidiasis of the penis -Patient reports new plaques over the penis; exam consistent w/ candidiasis -I have prescribed fluconazole 100mg  daily x 7 days   Constipation -I encouraged the patient to add docusate and Miralax in addition to Dulcolax; if still constipated, he may also try milk of magnesia  Port-a-cath in place -q6-8week flush for now  No orders of the defined types were placed in this encounter.  All questions were answered. The patient knows to call the [facility] with any problems, questions or concerns. No barriers to learning was detected.  Return in 1 week for labs, port flush and [facility] appt.   Tish Men, MD 08/20/2018 3:11 PM  CHIEF COMPLAINT: "This week has been hard"  INTERVAL HISTORY: [facility] for follow-up of left tonsillar squamous cell carcinoma on adjuvant chemoradiation.  Patient reports past week, he has lost over 10 pounds.  He has been taking 7 cans of Osmolite per day as well as 3 protein supplements and 40 to 50 ounces of free water per day.  He has persistent dry mouth, for which she has been trying to do salt soda rinses, but it caused him to gag and to feel nauseous.  He has persistent constipation despite Dulcolax.  He has been putting multiple sugar cubes in his tea throughout the day because of decreased taste try to help him drink more fluids.  Patient's glucose was 850 today, and the patient was sent to the ER for further evaluation.  SUMMARY OF ONCOLOGIC HISTORY:   Squamous cell carcinoma of left tonsil ([place])   03/20/2018 Procedure    FNA of the left cervical LN in office    03/20/2018 Pathology  Results    Non-diagnostic    04/07/2018 Procedure    US-guided left cervical LN biopsy/FNA     04/07/2018 Pathology Results    (Accession: [id number]) Lymph node, needle/core biopsy, left cervical - METASTATIC SQUAMOUS CELL CARCINOMA INVOLVING A LYMPH NODE (1/1) - SEE COMMENT  p16 IHC positive. EBV ISH negative.     04/22/2018 Pathology Results    (Accession: [id number]) Tonsil, biopsy, left, base of tongue - INVASIVE KERATINIZING SQUAMOUS CELL CARCINOMA.    04/29/2018 Imaging    PET:  IMPRESSION: 1. Left palatine tonsil/adjacent pharyngeal mucosal space lesion is asymmetric, measures about 1.3 cm in long axis, and has a maximum SUV of 8.2, compatible with malignancy. There is associated hypermetabolic left level IIa adenopathy as shown on prior exams. 2. Symmetric accentuated activity in the lymphoid tissue at the tongue base, maximum SUV 5.3, quite likely physiologic given the symmetry.  3. No findings of metastatic disease Pred to the chest, abdomen/pelvis, or skeleton.    05/26/2018 Surgery    Radical tonsillectomy    05/26/2018 Pathology Results    PROCEDURE: Radical tonsillectomy TUMOR SITE: Glossotonsillar sulcus TUMOR LATERALITY: LEFT TUMOR FOCALITY: Unifocal TUMOR SIZE: GREATEST DIMENSION: 1.6 cm HISTOLOGIC TYPE: HPV-mediated squamous cell carcinoma MARGINS: Involved by invasive tumor (focal) Specify location of closest margin: Tongue base (soft tissue) Note: Although an additional tongue base mucosal margin was submitted, location of tumor extension to margin appears 1 cm from the mucosal surface. LYMPHOVASCULAR INVASION: Not identified PERINEURAL INVASION: Present REGIONAL LYMPH NODES:  NUMBER OF LYMPH  NODES INVOLVED: 1  NUMBER OF LYMPH NODES EXAMINED: 6  LATERALITY OF LYMPH NODES INVOLVED: Ipsilateral  SIZE OF LARGEST METASTATIC DEPOSIT: 6.8 cm  EXTRANODAL EXTENSION: Present   Distance from lymph node capsule: 6 millimeters PATHOLOGIC  STAGE CLASSIFICATION (pTNM, AJCC 8TH Ed): pT1 pN1 (HPV-mediated oropharynx tumor)  pT1: Tumor 2 cm or smaller in greatest dimension  pN1: Metastasis in 4 or fewer lymph nodes ANCILLARY STUDIES:  p16 expression by immunohistochemistry: Positive  HPV by PCR: pending    05/26/2018 Cancer Staging    Staging form: Pharynx - HPV-Mediated Oropharynx, AJCC 8th Edition - Pathologic stage from 05/26/2018: Stage I (pT1, pN1, cM0, p16+) - Signed by Tish Men, MD on 06/18/2018    07/10/2018 - 07/10/2018 Chemotherapy    The patient had palonosetron (ALOXI) injection 0.25 mg, 0.25 mg, Intravenous,  Once, 0 of 3 cycles CISplatin (PLATINOL) 198 mg in sodium chloride 0.9 % 500 mL chemo infusion, 100 mg/m2 = 198 mg, Intravenous,  Once, 0 of 3 cycles fosaprepitant (EMEND) 150 mg, dexamethasone (DECADRON) 12 mg in sodium chloride 0.9 % 145 mL IVPB, , Intravenous,  Once, 0 of 3 cycles  for chemotherapy treatment.     07/10/2018 -  Chemotherapy    The patient had palonosetron (ALOXI) injection 0.25 mg, 0.25 mg, Intravenous,  Once, 6 of 7 cycles Administration: 0.25 mg (07/10/2018), 0.25 mg (07/18/2018), 0.25 mg (07/24/2018), 0.25 mg (07/31/2018), 0.25 mg (08/07/2018), 0.25 mg (08/14/2018) CISplatin (PLATINOL) 79 mg in sodium chloride 0.9 % 250 mL chemo infusion, 40 mg/m2 = 79 mg, Intravenous,  Once, 6 of 7 cycles Administration: 79 mg (07/10/2018), 79 mg (07/18/2018), 79 mg (07/24/2018), 79 mg (07/31/2018), 79 mg (08/07/2018), 79 mg (08/14/2018) fosaprepitant (EMEND) 150 mg, dexamethasone (DECADRON) 12 mg in sodium chloride 0.9 % 145 mL IVPB, , Intravenous,  Once, 6 of 7 cycles Administration:  (07/10/2018),  (07/18/2018),  (07/24/2018),  (07/31/2018),  (08/07/2018),  (08/14/2018)  for chemotherapy treatment.      Malignant neoplasm of base of tongue ([place])   05/09/2018 Initial Diagnosis    Malignant neoplasm of base of tongue ([place])    05/09/2018 Cancer Staging    Staging form: Pharynx - HPV-Mediated Oropharynx, AJCC 8th  Edition - Clinical: Stage I (cT1, cN1, cM0, p16+) - Signed by Eppie Gibson, MD on 05/09/2018    06/11/2018 Cancer Staging    Staging form: Pharynx - HPV-Mediated Oropharynx, AJCC 8th Edition - Pathologic: Stage I (pT1, pN1, cM0, p16+) - Signed by Eppie Gibson, MD on 06/11/2018     REVIEW OF SYSTEMS:   Constitutional: ( - ) fevers, ( - )  chills , ( - ) night sweats Eyes: ( - ) blurriness of vision, ( - ) double vision, ( - ) watery eyes Ears, nose, mouth, throat, and face: ( - ) mucositis, ( - ) sore throat Respiratory: ( - ) cough, ( - ) dyspnea, ( - ) wheezes Cardiovascular: ( - ) palpitation, ( - ) chest discomfort, ( - ) lower extremity swelling Gastrointestinal:  ( - ) nausea, ( - ) heartburn, ( - ) change in bowel habits Skin: ( - ) abnormal skin rashes Lymphatics: ( - ) new lymphadenopathy, ( - ) easy bruising Neurological: ( - ) numbness, ( - ) tingling, ( - ) new weaknesses Behavioral/Psych: ( - ) mood change, ( - ) new changes  All other systems were reviewed with the patient and are negative.  I have reviewed the past medical history, past surgical history, social history and  family history with the patient and they are unchanged from previous note.  ALLERGIES:  has No Known Allergies.  MEDICATIONS:  Current Outpatient Medications  Medication Sig Dispense Refill  . clotrimazole (MYCELEX) 10 MG troche Take 1 lozenge (10 mg total) by mouth 5 (five) times daily for 30 days. 150 lozenge 1  . lidocaine (XYLOCAINE) 2 % solution Patient: Mix 1part 2% viscous lidocaine, 1part H20. Swish & swallow 24mL of diluted mixture, 101min before meals and at bedtime, up to QID 100 mL 5  . LORazepam (ATIVAN) 0.5 MG tablet Take 1 tablet (0.5 mg total) by mouth every 6 (six) hours as needed (Nausea or vomiting). 30 tablet 0  . morphine (MSIR) 15 MG tablet Take 1 tablet (15 mg total) by mouth every 4 (four) hours as needed for severe pain. 60 tablet 0  . Nutritional Supplements (FEEDING  SUPPLEMENT, OSMOLITE 1.5 CAL,) LIQD Give 1.5 bottles Osmolite 1.5 Bid via PEG and 2 bottles Osmolite 1.5 via PEG for total of 7 bottles daily.  Give 60 cc free water before and after bolus feedings 4 times daily.  In addition consume or give via PEG 240 mL free water 3 times daily.  Please send formula and split gauze to patient.  He does not need other supplies at this time. 7 Bottle 0  . Nutritional Supplements (PROMOD) LIQD Add 30 mL Promod TID. Continue 7 bottles Osmolite 1.5 daily. 2 Bottle 1  . pramipexole (MIRAPEX) 0.5 MG tablet Take 1 mg by mouth at bedtime.     . sodium fluoride (PREVIDENT 5000 PLUS) 1.1 % CREA dental cream Apply to tooth brush. Brush teeth for 2 minutes. Spit out excess. DO NOT rinse afterwards. Repeat nightly. 1 Tube prn  . Cholecalciferol (VITAMIN D3) 50 MCG (2000 UT) TABS Take 2,000 Units by mouth daily.    Marland Kitchen dexamethasone (DECADRON) 4 MG tablet Take 2 tablets by mouth once a day on the day after chemotherapy and then take 2 tablets two times a day for 2 days. Take with food. (Patient not taking: Reported on 07/23/2018) 30 tablet 1  . famotidine (PEPCID) 20 MG tablet Take 20 mg by mouth daily as needed for heartburn or indigestion.    . fluconazole (DIFLUCAN) 100 MG tablet Take 1 tablet (100 mg total) by mouth daily for 7 days. 7 tablet 0  . GLUCOSAMINE-CHONDROITIN PO Take 1 tablet by mouth daily.    Marland Kitchen lidocaine-prilocaine (EMLA) cream Apply to affected area once (Patient not taking: Reported on 08/20/2018) 30 g 3  . Magnesium 500 MG TABS Take 500 mg by mouth daily.    . ondansetron (ZOFRAN) 8 MG tablet Take 1 tablet (8 mg total) by mouth 2 (two) times daily as needed. Start on the third day after chemotherapy. (Patient not taking: Reported on 08/20/2018) 30 tablet 1  . oxyCODONE (OXY IR/ROXICODONE) 5 MG immediate release tablet PLEASE SEE ATTACHED FOR DETAILED DIRECTIONS    . phenazopyridine (PYRIDIUM) 200 MG tablet Take 1 tablet (200 mg total) by mouth 3 (three) times daily as  needed for pain. (Patient not taking: Reported on 07/23/2018) 10 tablet 0  . prochlorperazine (COMPAZINE) 10 MG tablet Take 1 tablet (10 mg total) by mouth every 6 (six) hours as needed (Nausea or vomiting). (Patient not taking: Reported on 07/23/2018) 30 tablet 1   No current facility-administered medications for this visit.     PHYSICAL EXAMINATION: ECOG PERFORMANCE STATUS: 2 - Symptomatic, <50% confined to bed  Today's Vitals   08/20/18 1433 08/20/18  1436  BP: (!) 155/102   Pulse: (!) 111   Resp: 18   Temp: 97.8 F (36.6 C)   TempSrc: Oral   SpO2: 92%   Weight: 161 lb 1.6 oz (73.1 kg)   Height: 5\' 8"  (1.727 m)   PainSc:  7    Body mass index is 24.5 kg/m.  Filed Weights   08/20/18 1433  Weight: 161 lb 1.6 oz (73.1 kg)    GENERAL: alert, no distress, pale and frail appearing SKIN: a small white patch over the foreskin of the penis, c/w candidiasis EYES: conjunctiva are pink and non-injected, sclera clear  NECK: supple, non-tender LYMPH:  no palpable lymphadenopathy in the cervical LUNGS: clear to auscultation with normal breathing effort HEART: regular rate & rhythm and no murmurs and no lower extremity edema ABDOMEN: soft, non-tender, non-distended, normal bowel sounds Musculoskeletal: no cyanosis of digits and no clubbing  PSYCH: alert & oriented x 3, fluent speech NEURO: no focal motor/sensory deficits  LABORATORY DATA:  I have reviewed the data as listed    Component Value Date/Time   NA 139 08/20/2018 1408   K 4.4 08/20/2018 1408   CL 98 08/20/2018 1408   CO2 25 08/20/2018 1408   GLUCOSE 850 (HH) 08/20/2018 1408   BUN 42 (H) 08/20/2018 1408   CREATININE 1.60 (H) 08/20/2018 1408   CALCIUM 9.5 08/20/2018 1408   PROT 6.4 (L) 07/23/2018 1200   ALBUMIN 3.9 07/23/2018 1200   AST 15 07/23/2018 1200   AST 17 06/18/2018 1105   ALT 32 07/23/2018 1200   ALT 38 06/18/2018 1105   ALKPHOS 76 07/23/2018 1200   BILITOT 0.3 07/23/2018 1200   BILITOT 0.4 06/18/2018 1105    GFRNONAA 46 (L) 08/20/2018 1408   GFRAA 54 (L) 08/20/2018 1408    No results found for: SPEP, UPEP  Lab Results  Component Value Date   WBC 2.4 (L) 08/20/2018   NEUTROABS 2.0 08/20/2018   HGB 12.8 (L) 08/20/2018   HCT 38.8 (L) 08/20/2018   MCV 98.0 08/20/2018   PLT 129 (L) 08/20/2018      Chemistry      Component Value Date/Time   NA 139 08/20/2018 1408   K 4.4 08/20/2018 1408   CL 98 08/20/2018 1408   CO2 25 08/20/2018 1408   BUN 42 (H) 08/20/2018 1408   CREATININE 1.60 (H) 08/20/2018 1408      Component Value Date/Time   CALCIUM 9.5 08/20/2018 1408   ALKPHOS 76 07/23/2018 1200   AST 15 07/23/2018 1200   AST 17 06/18/2018 1105   ALT 32 07/23/2018 1200   ALT 38 06/18/2018 1105   BILITOT 0.3 07/23/2018 1200   BILITOT 0.4 06/18/2018 1105

## 2018-08-20 ENCOUNTER — Encounter (HOSPITAL_COMMUNITY): Payer: Self-pay

## 2018-08-20 ENCOUNTER — Inpatient Hospital Stay: Payer: Commercial Managed Care - PPO | Attending: Hematology | Admitting: Hematology

## 2018-08-20 ENCOUNTER — Inpatient Hospital Stay: Payer: Commercial Managed Care - PPO

## 2018-08-20 ENCOUNTER — Emergency Department (HOSPITAL_COMMUNITY)
Admission: EM | Admit: 2018-08-20 | Discharge: 2018-08-20 | Disposition: A | Discharge status: Home / Self Care | Payer: Commercial Managed Care - PPO | Attending: Emergency Medicine | Admitting: Emergency Medicine

## 2018-08-20 ENCOUNTER — Telehealth: Payer: Self-pay | Admitting: Hematology

## 2018-08-20 ENCOUNTER — Other Ambulatory Visit: Payer: Self-pay

## 2018-08-20 ENCOUNTER — Encounter: Payer: Self-pay | Admitting: Hematology

## 2018-08-20 ENCOUNTER — Ambulatory Visit: Payer: Commercial Managed Care - PPO

## 2018-08-20 VITALS — BP 155/102 | HR 111 | Temp 97.8°F | Resp 18 | Ht 68.0 in | Wt 161.1 lb

## 2018-08-20 DIAGNOSIS — Z79899 Other long term (current) drug therapy: Secondary | ICD-10-CM | POA: Insufficient documentation

## 2018-08-20 DIAGNOSIS — N179 Acute kidney failure, unspecified: Secondary | ICD-10-CM

## 2018-08-20 DIAGNOSIS — E1165 Type 2 diabetes mellitus with hyperglycemia: Secondary | ICD-10-CM

## 2018-08-20 DIAGNOSIS — E46 Unspecified protein-calorie malnutrition: Secondary | ICD-10-CM

## 2018-08-20 DIAGNOSIS — E860 Dehydration: Secondary | ICD-10-CM

## 2018-08-20 DIAGNOSIS — B3742 Candidal balanitis: Secondary | ICD-10-CM

## 2018-08-20 DIAGNOSIS — C01 Malignant neoplasm of base of tongue: Secondary | ICD-10-CM

## 2018-08-20 DIAGNOSIS — D6481 Anemia due to antineoplastic chemotherapy: Secondary | ICD-10-CM

## 2018-08-20 DIAGNOSIS — H9319 Tinnitus, unspecified ear: Secondary | ICD-10-CM

## 2018-08-20 DIAGNOSIS — C099 Malignant neoplasm of tonsil, unspecified: Secondary | ICD-10-CM

## 2018-08-20 DIAGNOSIS — Z931 Gastrostomy status: Secondary | ICD-10-CM

## 2018-08-20 DIAGNOSIS — T451X5S Adverse effect of antineoplastic and immunosuppressive drugs, sequela: Secondary | ICD-10-CM

## 2018-08-20 DIAGNOSIS — R739 Hyperglycemia, unspecified: Secondary | ICD-10-CM

## 2018-08-20 DIAGNOSIS — T451X5A Adverse effect of antineoplastic and immunosuppressive drugs, initial encounter: Secondary | ICD-10-CM

## 2018-08-20 DIAGNOSIS — Z85810 Personal history of malignant neoplasm of tongue: Secondary | ICD-10-CM | POA: Insufficient documentation

## 2018-08-20 DIAGNOSIS — D701 Agranulocytosis secondary to cancer chemotherapy: Secondary | ICD-10-CM

## 2018-08-20 DIAGNOSIS — R439 Unspecified disturbances of smell and taste: Secondary | ICD-10-CM

## 2018-08-20 DIAGNOSIS — D6959 Other secondary thrombocytopenia: Secondary | ICD-10-CM

## 2018-08-20 DIAGNOSIS — Z85818 Personal history of malignant neoplasm of other sites of lip, oral cavity, and pharynx: Secondary | ICD-10-CM | POA: Insufficient documentation

## 2018-08-20 DIAGNOSIS — R110 Nausea: Secondary | ICD-10-CM

## 2018-08-20 DIAGNOSIS — H903 Sensorineural hearing loss, bilateral: Secondary | ICD-10-CM

## 2018-08-20 DIAGNOSIS — K5900 Constipation, unspecified: Secondary | ICD-10-CM

## 2018-08-20 DIAGNOSIS — B3749 Other urogenital candidiasis: Secondary | ICD-10-CM

## 2018-08-20 HISTORY — DX: Malignant (primary) neoplasm, unspecified: C80.1

## 2018-08-20 LAB — POCT I-STAT EG7
Acid-Base Excess: 4 mmol/L — ABNORMAL HIGH (ref 0.0–2.0)
Bicarbonate: 29.1 mmol/L — ABNORMAL HIGH (ref 20.0–28.0)
Calcium, Ion: 1.23 mmol/L (ref 1.15–1.40)
HCT: 38 % — ABNORMAL LOW (ref 39.0–52.0)
Hemoglobin: 12.9 g/dL — ABNORMAL LOW (ref 13.0–17.0)
O2 Saturation: 61 %
Potassium: 4.7 mmol/L (ref 3.5–5.1)
Sodium: 135 mmol/L (ref 135–145)
TCO2: 30 mmol/L (ref 22–32)
pCO2, Ven: 46.1 mmHg (ref 44.0–60.0)
pH, Ven: 7.409 (ref 7.250–7.430)
pO2, Ven: 32 mmHg (ref 32.0–45.0)

## 2018-08-20 LAB — CBC WITH DIFFERENTIAL (CANCER CENTER ONLY)
Abs Immature Granulocytes: 0.02 10*3/uL (ref 0.00–0.07)
Basophils Absolute: 0 10*3/uL (ref 0.0–0.1)
Basophils Relative: 0 %
Eosinophils Absolute: 0 10*3/uL (ref 0.0–0.5)
Eosinophils Relative: 0 %
HCT: 38.8 % — ABNORMAL LOW (ref 39.0–52.0)
Hemoglobin: 12.8 g/dL — ABNORMAL LOW (ref 13.0–17.0)
Immature Granulocytes: 1 %
Lymphocytes Relative: 4 %
Lymphs Abs: 0.1 10*3/uL — ABNORMAL LOW (ref 0.7–4.0)
MCH: 32.3 pg (ref 26.0–34.0)
MCHC: 33 g/dL (ref 30.0–36.0)
MCV: 98 fL (ref 80.0–100.0)
Monocytes Absolute: 0.3 10*3/uL (ref 0.1–1.0)
Monocytes Relative: 11 %
Neutro Abs: 2 10*3/uL (ref 1.7–7.7)
Neutrophils Relative %: 84 %
Platelet Count: 129 10*3/uL — ABNORMAL LOW (ref 150–400)
RBC: 3.96 MIL/uL — ABNORMAL LOW (ref 4.22–5.81)
RDW: 14.6 % (ref 11.5–15.5)
WBC Count: 2.4 10*3/uL — ABNORMAL LOW (ref 4.0–10.5)
nRBC: 1.3 % — ABNORMAL HIGH (ref 0.0–0.2)

## 2018-08-20 LAB — URINALYSIS, ROUTINE W REFLEX MICROSCOPIC
Bacteria, UA: NONE SEEN
Bilirubin Urine: NEGATIVE
Glucose, UA: >=500 mg/dL — AB
Hgb urine dipstick: NEGATIVE
Ketones, ur: NEGATIVE mg/dL
Leukocytes,Ua: NEGATIVE
Nitrite: NEGATIVE
Protein, ur: NEGATIVE mg/dL
Specific Gravity, Urine: 1.027 (ref 1.005–1.030)
pH: 6 (ref 5.0–8.0)

## 2018-08-20 LAB — CBC WITH DIFFERENTIAL/PLATELET
Abs Immature Granulocytes: 0.01 10*3/uL (ref 0.00–0.07)
Basophils Absolute: 0 10*3/uL (ref 0.0–0.1)
Basophils Relative: 1 %
Eosinophils Absolute: 0 10*3/uL (ref 0.0–0.5)
Eosinophils Relative: 0 %
HCT: 37.8 % — ABNORMAL LOW (ref 39.0–52.0)
Hemoglobin: 12.4 g/dL — ABNORMAL LOW (ref 13.0–17.0)
Immature Granulocytes: 1 %
Lymphocytes Relative: 4 %
Lymphs Abs: 0.1 10*3/uL — ABNORMAL LOW (ref 0.7–4.0)
MCH: 32.4 pg (ref 26.0–34.0)
MCHC: 32.8 g/dL (ref 30.0–36.0)
MCV: 98.7 fL (ref 80.0–100.0)
Monocytes Absolute: 0.3 10*3/uL (ref 0.1–1.0)
Monocytes Relative: 13 %
Neutro Abs: 1.7 10*3/uL (ref 1.7–7.7)
Neutrophils Relative %: 81 %
Platelets: 126 10*3/uL — ABNORMAL LOW (ref 150–400)
RBC: 3.83 MIL/uL — ABNORMAL LOW (ref 4.22–5.81)
RDW: 14.6 % (ref 11.5–15.5)
WBC: 2.1 10*3/uL — ABNORMAL LOW (ref 4.0–10.5)
nRBC: 1 % — ABNORMAL HIGH (ref 0.0–0.2)

## 2018-08-20 LAB — HEPATIC FUNCTION PANEL
ALT: 41 U/L (ref 0–44)
AST: 18 U/L (ref 15–41)
Albumin: 3.9 g/dL (ref 3.5–5.0)
Alkaline Phosphatase: 131 U/L — ABNORMAL HIGH (ref 38–126)
Bilirubin, Direct: 0.1 mg/dL (ref 0.0–0.2)
Indirect Bilirubin: 0.3 mg/dL (ref 0.3–0.9)
Total Bilirubin: 0.4 mg/dL (ref 0.3–1.2)
Total Protein: 6.8 g/dL (ref 6.5–8.1)

## 2018-08-20 LAB — MAGNESIUM: Magnesium: 2.3 mg/dL (ref 1.7–2.4)

## 2018-08-20 LAB — CBG MONITORING, ED
Glucose-Capillary: >600 mg/dL (ref 70–99)
Glucose-Capillary: 530 mg/dL (ref 70–99)
Glucose-Capillary: 373 mg/dL — ABNORMAL HIGH (ref 70–99)

## 2018-08-20 LAB — BASIC METABOLIC PANEL - CANCER CENTER ONLY
Anion gap: 16 — ABNORMAL HIGH (ref 5–15)
BUN: 42 mg/dL — ABNORMAL HIGH (ref 6–20)
CO2: 25 mmol/L (ref 22–32)
Calcium: 9.5 mg/dL (ref 8.9–10.3)
Chloride: 98 mmol/L (ref 98–111)
Creatinine: 1.6 mg/dL — ABNORMAL HIGH (ref 0.61–1.24)
GFR, Est AFR Am: 54 mL/min — ABNORMAL LOW (ref >60)
GFR, Estimated: 46 mL/min — ABNORMAL LOW (ref >60)
Glucose, Bld: 850 mg/dL (ref 70–99)
Potassium: 4.4 mmol/L (ref 3.5–5.1)
Sodium: 139 mmol/L (ref 135–145)

## 2018-08-20 LAB — I-STAT CREATININE, ED: Creatinine, Ser: 0.9 mg/dL (ref 0.61–1.24)

## 2018-08-20 MED ORDER — HEPARIN SOD (PORK) LOCK FLUSH 100 UNIT/ML IV SOLN
500.0000 [IU] | Freq: Once | INTRAVENOUS | Status: AC
  Administered 2018-08-20: 500 [IU]
  Filled 2018-08-20: qty 5

## 2018-08-20 MED ORDER — SODIUM CHLORIDE 0.9 % IV BOLUS
1000.0000 mL | Freq: Once | INTRAVENOUS | Status: AC
  Administered 2018-08-20: 1000 mL via INTRAVENOUS

## 2018-08-20 MED ORDER — METFORMIN HCL 500 MG PO TABS
500.0000 mg | ORAL_TABLET | Freq: Once | ORAL | Status: AC
  Administered 2018-08-20: 500 mg via ORAL
  Filled 2018-08-20: qty 1

## 2018-08-20 MED ORDER — METFORMIN HCL 500 MG PO TABS: 500.0000 mg | ORAL_TABLET | Freq: Two times a day (BID) | ORAL | 0 refills | Status: DC

## 2018-08-20 MED ORDER — FLUCONAZOLE 100 MG PO TABS: 100.0000 mg | ORAL_TABLET | Freq: Every day | ORAL | 0 refills | Status: DC

## 2018-08-20 NOTE — Progress Notes (Signed)
Notified Dr. Maylon Peppers of critical glucose level of 850

## 2018-08-20 NOTE — ED Notes (Signed)
Pt given 2 cups of water per request

## 2018-08-20 NOTE — ED Provider Notes (Signed)
[facility] DEPT Provider Note   CSN: [id number] Arrival date & time: 08/20/18  1516    History   Chief Complaint Chief Complaint  Patient presents with  . Abnormal Lab    HPI Glenn Goodman is a 58 y.o. male.     HPI   He was sent here for evaluation of hyperglycemia, renal insufficiency, weight loss and suspected dehydration, based on recent laboratory testing, by his oncologist.  He was apparently being seen today for routine visit, when these abnormalities were discovered.  CBG was elevated last week on routine testing as well.  No AKI at that time.  He has had notable weight loss from 82 kg to 73 kg today, over the last 4 weeks.  Patient reported to his doctor today that he was using "lots of sugar," to improve the taste of his food.  He has been using 6 sugar cubes and his fatigue, and adding some Gatorade to other beverages, to improve the taste.  He is taking prednisone occasionally for tolerance of chemotherapy medications.  He denies known history of hyperglycemia or diabetes.  He denies family history for diabetes.  He denies fever, chills, cough, sore throat, nausea, vomiting, weakness or dizziness.  He has urinary frequency without dysuria or hematuria.  There are no other known modifying factors.  Past Medical History:  Diagnosis Date  . Arthritis   . Cancer ([place])   . OSA on CPAP   . Restless leg syndrome     Patient Active Problem List   Diagnosis Date Noted  . Protein malnutrition ([place]) 07/30/2018  . Mucositis due to chemotherapy 07/23/2018  . Chemotherapy-induced nausea 07/17/2018  . Chemotherapy-induced diarrhea 07/17/2018  . Oral candidiasis 07/17/2018  . Port-A-Cath in place 07/09/2018  . Sensorineural hearing loss (SNHL) of both ears 06/18/2018  . Odynophagia 06/18/2018  . Malignant neoplasm of base of tongue ([place]) 05/09/2018  . 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 DIRECT LARYNGOSCOPY Left 04/22/2018   Dr. Janace Hoard  . FACIAL FRACTURE SURGERY Left 1986  . IR GASTROSTOMY TUBE MOD SED  06/30/2018  . IR IMAGING GUIDED PORT INSERTION  06/30/2018  . MODIFIED RADICAL NECK DISSECTION Left 05/26/2018   Dr. Nicolette Bang, [facility]  . transoral robotic surgery  05/26/2018   [facility], Dr. Nicolette Bang [facility]        Home Medications    Prior to Admission medications   Medication Sig Start Date End Date Taking? Authorizing Provider  clotrimazole (MYCELEX) 10 MG troche Take 1 lozenge (10 mg total) by mouth 5 (five) times daily for 30 days. 07/30/18 08/29/18 Yes Tish Men, MD  dexamethasone (DECADRON) 4 MG tablet Take 2 tablets by mouth once a day on the day after chemotherapy and then take 2 tablets two times a day for 2 days. Take with food. 06/23/18  Yes Tish Men, MD  docusate sodium (COLACE) 100 MG capsule Take 100 mg by mouth 2 (two) times daily as needed for mild constipation.   Yes [provider]  lidocaine (XYLOCAINE) 2 % solution Patient: Mix 1part 2% viscous lidocaine, 1part H20. Swish & swallow 27mL of diluted mixture, 56min before meals and at bedtime, up to QID 07/14/18  Yes Eppie Gibson, MD  lidocaine-prilocaine (EMLA) cream Apply to affected area once 06/18/18  Yes Tish Men, MD  LORazepam (ATIVAN) 0.5 MG tablet Take 1 tablet (0.5 mg total) by mouth every 6 (six) hours as  needed (Nausea or vomiting). 07/30/18  Yes Tish Men, MD  morphine (MSIR) 15 MG tablet Take 1 tablet (15 mg total) by mouth every 4 (four) hours as needed for severe pain. Patient taking differently: Take 15 mg by mouth at bedtime as needed for severe pain.  08/13/18  Yes Tish Men, MD  Neomycin-Bacitracin-Polymyxin (NEOSPORIN EX) Apply 1 application topically daily as needed (neck wound).   Yes [provider]  Nutritional Supplements (FEEDING SUPPLEMENT, OSMOLITE 1.5 CAL,) LIQD Give 1.5 bottles Osmolite 1.5 Bid via PEG and 2 bottles Osmolite 1.5 via PEG for  total of 7 bottles daily.  Give 60 cc free water before and after bolus feedings 4 times daily.  In addition consume or give via PEG 240 mL free water 3 times daily.  Please send formula and split gauze to patient.  He does not need other supplies at this time. 08/18/18  Yes Eppie Gibson, MD  Nutritional Supplements (PROMOD) LIQD Add 30 mL Promod TID. Continue 7 bottles Osmolite 1.5 daily. 08/18/18  Yes Eppie Gibson, MD  ondansetron (ZOFRAN) 8 MG tablet Take 1 tablet (8 mg total) by mouth 2 (two) times daily as needed. Start on the third day after chemotherapy. 06/18/18  Yes Tish Men, MD  pramipexole (MIRAPEX) 1 MG tablet Take 1 mg by mouth at bedtime. 08/08/18  Yes [provider]  sodium fluoride (PREVIDENT 5000 PLUS) 1.1 % CREA dental cream Apply to tooth brush. Brush teeth for 2 minutes. Spit out excess. DO NOT rinse afterwards. Repeat nightly. 04/30/18  Yes Lenn Cal, DDS  Wound Dressings (SONAFINE EX) Apply 1 application topically daily as needed (neck wound).   Yes [provider]  fluconazole (DIFLUCAN) 100 MG tablet Take 1 tablet (100 mg total) by mouth daily for 7 days. Patient not taking: Reported on 08/20/2018 08/20/18 08/27/18  Tish Men, MD  metFORMIN (GLUCOPHAGE) 500 MG tablet Take 1 tablet (500 mg total) by mouth 2 (two) times daily with a meal. 08/20/18   Daleen Bo, MD  phenazopyridine (PYRIDIUM) 200 MG tablet Take 1 tablet (200 mg total) by mouth 3 (three) times daily as needed for pain. Patient not taking: Reported on 07/23/2018 07/10/18   Harle Stanford., PA-C  prochlorperazine (COMPAZINE) 10 MG tablet Take 1 tablet (10 mg total) by mouth every 6 (six) hours as needed (Nausea or vomiting). Patient not taking: Reported on 08/20/2018 06/18/18   Tish Men, MD    Family History Family History  Problem Relation Age of Onset  . Hypertension Mother   . Prostate cancer Father     Social History Social History   Tobacco Use  . Smoking status: Never Smoker  .  Smokeless tobacco: Never Used  Substance Use Topics  . Alcohol use: Not Currently  . Drug use: Never     Allergies   Patient has no known allergies.   Review of Systems Review of Systems  All other systems reviewed and are negative.    Physical Exam Updated Vital Signs BP (!) 139/96 (BP Location: Right Arm)   Pulse 93   Temp 97.6 F (36.4 C) (Oral)   Resp 20   Ht 5\' 8"  (1.727 m)   Wt 74.8 kg   SpO2 96%   BMI 25.09 kg/m   Physical Exam Vitals signs and nursing note reviewed.  Constitutional:      Appearance: He is well-developed.  HENT:     Head: Normocephalic and atraumatic.     Right Ear: External ear normal.  Left Ear: External ear normal.     Nose: No congestion or rhinorrhea.     Mouth/Throat:     Pharynx: No oropharyngeal exudate.  Eyes:     Conjunctiva/sclera: Conjunctivae normal.     Pupils: Pupils are equal, round, and reactive to light.  Neck:     Musculoskeletal: Normal range of motion and neck supple.     Trachea: Phonation normal.  Cardiovascular:     Rate and Rhythm: Normal rate and regular rhythm.     Heart sounds: Normal heart sounds.  Pulmonary:     Effort: Pulmonary effort is normal.     Breath sounds: Normal breath sounds.  Abdominal:     Palpations: Abdomen is soft.     Tenderness: There is no abdominal tenderness.  Musculoskeletal: Normal range of motion.  Skin:    General: Skin is warm and dry.     Comments: Red rash with excoriations anterior neck, consistent with radiation dermatitis.  Neurological:     Mental Status: He is alert and oriented to person, place, and time.     Cranial Nerves: No cranial nerve deficit.     Sensory: No sensory deficit.     Motor: No abnormal muscle tone.     Coordination: Coordination normal.  Psychiatric:        Mood and Affect: Mood normal.        Behavior: Behavior normal.        Thought Content: Thought content normal.        Judgment: Judgment normal.      ED Treatments / Results   Labs (all labs ordered are listed, but only abnormal results are displayed) Labs Reviewed  CBC WITH DIFFERENTIAL/PLATELET - Abnormal; Notable for the following components:      Result Value   WBC 2.1 (*)    RBC 3.83 (*)    Hemoglobin 12.4 (*)    HCT 37.8 (*)    Platelets 126 (*)    nRBC 1.0 (*)    Lymphs Abs 0.1 (*)    All other components within normal limits  URINALYSIS, ROUTINE W REFLEX MICROSCOPIC - Abnormal; Notable for the following components:   Color, Urine STRAW (*)    Glucose, UA >=500 (*)    All other components within normal limits  HEPATIC FUNCTION PANEL - Abnormal; Notable for the following components:   Alkaline Phosphatase 131 (*)    All other components within normal limits  CBG MONITORING, ED - Abnormal; Notable for the following components:   Glucose-Capillary >600 (*)    All other components within normal limits  POCT I-STAT EG7 - Abnormal; Notable for the following components:   Bicarbonate 29.1 (*)    Acid-Base Excess 4.0 (*)    HCT 38.0 (*)    Hemoglobin 12.9 (*)    All other components within normal limits  CBG MONITORING, ED - Abnormal; Notable for the following components:   Glucose-Capillary 530 (*)    All other components within normal limits  CBG MONITORING, ED - Abnormal; Notable for the following components:   Glucose-Capillary 373 (*)    All other components within normal limits  I-STAT CREATININE, ED    EKG None  Radiology No results found.  Procedures .Critical Care Performed by: Daleen Bo, MD Authorized by: Daleen Bo, MD   Critical care provider statement:    Critical care time (minutes):  35   Critical care start time:  08/20/2018 3:15 PM   Critical care end time:  08/20/2018 11:34 PM  Critical care time was exclusive of:  Separately billable procedures and treating other patients   Critical care was necessary to treat or prevent imminent or life-threatening deterioration of the following conditions:  Metabolic crisis    Critical care was time spent personally by me on the following activities:  Blood draw for specimens, development of treatment plan with patient or surrogate, discussions with consultants, evaluation of patient's response to treatment, examination of patient, obtaining history from patient or surrogate, ordering and performing treatments and interventions, ordering and review of laboratory studies, pulse oximetry, re-evaluation of patient's condition, review of old charts and ordering and review of radiographic studies   (including critical care time)  Medications Ordered in ED Medications  sodium chloride 0.9 % bolus 1,000 mL (0 mLs Intravenous Stopped 08/20/18 1716)  sodium chloride 0.9 % bolus 1,000 mL (0 mLs Intravenous Stopped 08/20/18 2108)  sodium chloride 0.9 % bolus 1,000 mL (0 mLs Intravenous Stopped 08/20/18 2217)  metFORMIN (GLUCOPHAGE) tablet 500 mg (500 mg Oral Given 08/20/18 2330)     Initial Impression / Assessment and Plan / ED Course  I have reviewed the triage vital signs and the nursing notes.  Pertinent labs & imaging results that were available during my care of the patient were reviewed by me and considered in my medical decision making (see chart for details).  Clinical Course as of Aug 19 2333  Wed Aug 20, 2018  1742 high  CBG monitoring, ED(!!) [EW]  1742 Normal except bicarb high, acid-base high, hemoglobin low  POCT I-Stat EG7(!) [EW]  1742 Normal except white count low, hemoglobin low, platelets low  CBC with Differential(!) [EW]  1742 Normal except glucose high  Urinalysis, Routine w reflex microscopic(!) [EW]  1743 Normal except alkaline phosphatase high  Hepatic function panel(!) [EW]  1743 WBC, UA: 0-5 [EW]    Clinical Course User Index [EW] Daleen Bo, MD        Patient Vitals for the past 24 hrs:  BP Temp Temp src Pulse Resp SpO2 Height Weight  08/20/18 2321 (!) 139/96 - - 93 20 96 % - -  08/20/18 2319 (!) 139/96 - - 99 16 100 % - -  08/20/18  2100 - - - - 16 - - -  08/20/18 2030 (!) 146/118 - - 97 - 97 % - -  08/20/18 2000 130/88 - - (!) 102 18 93 % - -  08/20/18 1900 115/86 - - (!) 102 - 92 % - -  08/20/18 1700 (!) 152/106 - - (!) 117 - 95 % - -  08/20/18 1600 (!) 148/108 - - (!) 135 - 95 % - -  08/20/18 1532 - - - - - - 5\' 8"  (1.727 m) 74.8 kg  08/20/18 1527 (!) 140/94 97.6 F (36.4 C) Oral (!) 107 18 97 % - -    11:30 PM Reevaluation with update and discussion. After initial assessment and treatment, an updated evaluation reveals he is comfortable at this time, findings discussed with the patient and all questions were answered. Daleen Bo   Medical Decision Making: Hyperglycemia likely multifactorial.  Patient improved with IV fluids, and did not require insulin treatment.  Doubt DKA, serious bacterial infection, metabolic instability or impending vascular collapse.  He stable for discharge with outpatient management ,  and close follow-up with PCP.   Ehtan Delfavero Gambrell was evaluated in Emergency Department on 08/20/2018 for the symptoms described in the history of present illness. He was evaluated in the context of the  global COVID-19 pandemic, which necessitated consideration that the patient might be at risk for infection with the SARS-CoV-2 virus that causes COVID-19. Institutional protocols and algorithms that pertain to the evaluation of patients at risk for COVID-19 are in a state of rapid change based on information released by regulatory bodies including the CDC and federal and state organizations. These policies and algorithms were followed during the patient's care in the ED.   CRITICAL CARE-yes Performed by: Daleen Bo  Nursing Notes Reviewed/ Care Coordinated Applicable Imaging Reviewed Interpretation of Laboratory Data incorporated into ED treatment  The patient appears reasonably screened and/or stabilized for discharge and I doubt any other medical condition or other [facility] requiring further screening,  evaluation, or treatment in the ED at this time prior to discharge.  Plan: Home Medications-continue usual; Home Treatments-carb modified diet, 1 to 2 L of water each day; return here if the recommended treatment, does not improve the symptoms; Recommended follow up-PCP, 1 week and as needed   Final Clinical Impressions(s) / ED Diagnoses   Final diagnoses:  Hyperglycemia    ED Discharge Orders         Ordered    metFORMIN (GLUCOPHAGE) 500 MG tablet  2 times daily with meals     08/20/18 2328           Daleen Bo, MD 08/20/18 2335

## 2018-08-20 NOTE — Telephone Encounter (Signed)
Scheduled appt per 4/1 los.  Left a VM about scheduled appts.

## 2018-08-20 NOTE — Discharge Instructions (Signed)
Try to moderate your carbohydrate intake, and drink 1 to 2 L of water each day.  Follow-up with your PCP in a week or so for a checkup and see if you need additional treatment or can possibly stop the medication.  Return here, if needed, for problems.

## 2018-08-20 NOTE — ED Notes (Signed)
Patient given water for po challenge 

## 2018-08-20 NOTE — ED Triage Notes (Signed)
Patient was at the [facility] for blood work. Patient states he had an abnormal BUN, creatinine, and glucose of 850. Patient states he has been drinking green tea and adding 5 lumps of sugar to his drinks since last week. Patient stated that it was the only thing that tasted good.  Patient states he has been dribbling when he urinates.

## 2018-08-20 NOTE — ED Notes (Signed)
Pt verbalized understanding of discharge orders, discussed medications and follow up care. Patient A+O x 4 and ambulatory. Port de accessed. Wife coming to pick up patient.

## 2018-08-21 ENCOUNTER — Encounter: Payer: Self-pay | Admitting: Radiation Oncology

## 2018-08-21 ENCOUNTER — Emergency Department (HOSPITAL_COMMUNITY): Payer: Commercial Managed Care - PPO

## 2018-08-21 ENCOUNTER — Encounter (HOSPITAL_COMMUNITY): Payer: Self-pay

## 2018-08-21 ENCOUNTER — Other Ambulatory Visit: Payer: Self-pay | Admitting: *Deleted

## 2018-08-21 ENCOUNTER — Inpatient Hospital Stay: Payer: Commercial Managed Care - PPO | Admitting: Hematology

## 2018-08-21 ENCOUNTER — Ambulatory Visit
Admission: RE | Admit: 2018-08-21 | Discharge: 2018-08-21 | Disposition: A | Discharge status: Home / Self Care | Payer: Commercial Managed Care - PPO | Source: Ambulatory Visit | Attending: Radiation Oncology | Admitting: Radiation Oncology

## 2018-08-21 ENCOUNTER — Other Ambulatory Visit: Payer: Self-pay

## 2018-08-21 ENCOUNTER — Other Ambulatory Visit: Payer: Self-pay | Admitting: Hematology

## 2018-08-21 ENCOUNTER — Inpatient Hospital Stay: Payer: Commercial Managed Care - PPO

## 2018-08-21 ENCOUNTER — Encounter: Payer: Self-pay | Admitting: Nutrition

## 2018-08-21 ENCOUNTER — Ambulatory Visit: Payer: Self-pay

## 2018-08-21 ENCOUNTER — Inpatient Hospital Stay (HOSPITAL_COMMUNITY)
Admission: EM | Admit: 2018-08-21 | Discharge: 2018-08-27 | DRG: 637 | Disposition: A | Discharge status: Home / Self Care | Payer: Commercial Managed Care - PPO | Attending: Internal Medicine | Admitting: Internal Medicine

## 2018-08-21 ENCOUNTER — Telehealth: Payer: Self-pay | Admitting: *Deleted

## 2018-08-21 VITALS — BP 99/76 | HR 132 | Resp 22

## 2018-08-21 DIAGNOSIS — R040 Epistaxis: Secondary | ICD-10-CM | POA: Diagnosis not present

## 2018-08-21 DIAGNOSIS — I82403 Acute embolism and thrombosis of unspecified deep veins of lower extremity, bilateral: Secondary | ICD-10-CM | POA: Diagnosis not present

## 2018-08-21 DIAGNOSIS — Z95828 Presence of other vascular implants and grafts: Secondary | ICD-10-CM

## 2018-08-21 DIAGNOSIS — I2692 Saddle embolus of pulmonary artery without acute cor pulmonale: Secondary | ICD-10-CM | POA: Diagnosis not present

## 2018-08-21 DIAGNOSIS — Z85818 Personal history of malignant neoplasm of other sites of lip, oral cavity, and pharynx: Secondary | ICD-10-CM

## 2018-08-21 DIAGNOSIS — R945 Abnormal results of liver function studies: Secondary | ICD-10-CM | POA: Diagnosis not present

## 2018-08-21 DIAGNOSIS — C099 Malignant neoplasm of tonsil, unspecified: Secondary | ICD-10-CM | POA: Diagnosis present

## 2018-08-21 DIAGNOSIS — I517 Cardiomegaly: Secondary | ICD-10-CM | POA: Diagnosis present

## 2018-08-21 DIAGNOSIS — Z8589 Personal history of malignant neoplasm of other organs and systems: Secondary | ICD-10-CM

## 2018-08-21 DIAGNOSIS — Z8249 Family history of ischemic heart disease and other diseases of the circulatory system: Secondary | ICD-10-CM

## 2018-08-21 DIAGNOSIS — E860 Dehydration: Secondary | ICD-10-CM | POA: Diagnosis not present

## 2018-08-21 DIAGNOSIS — D6959 Other secondary thrombocytopenia: Secondary | ICD-10-CM

## 2018-08-21 DIAGNOSIS — J9621 Acute and chronic respiratory failure with hypoxia: Secondary | ICD-10-CM | POA: Diagnosis not present

## 2018-08-21 DIAGNOSIS — I82432 Acute embolism and thrombosis of left popliteal vein: Secondary | ICD-10-CM | POA: Diagnosis present

## 2018-08-21 DIAGNOSIS — Z8042 Family history of malignant neoplasm of prostate: Secondary | ICD-10-CM

## 2018-08-21 DIAGNOSIS — H903 Sensorineural hearing loss, bilateral: Secondary | ICD-10-CM | POA: Diagnosis present

## 2018-08-21 DIAGNOSIS — N179 Acute kidney failure, unspecified: Secondary | ICD-10-CM | POA: Diagnosis not present

## 2018-08-21 DIAGNOSIS — I2699 Other pulmonary embolism without acute cor pulmonale: Secondary | ICD-10-CM

## 2018-08-21 DIAGNOSIS — G2581 Restless legs syndrome: Secondary | ICD-10-CM | POA: Diagnosis present

## 2018-08-21 DIAGNOSIS — D701 Agranulocytosis secondary to cancer chemotherapy: Secondary | ICD-10-CM | POA: Diagnosis not present

## 2018-08-21 DIAGNOSIS — D61818 Other pancytopenia: Secondary | ICD-10-CM

## 2018-08-21 DIAGNOSIS — G4733 Obstructive sleep apnea (adult) (pediatric): Secondary | ICD-10-CM | POA: Diagnosis present

## 2018-08-21 DIAGNOSIS — K1230 Oral mucositis (ulcerative), unspecified: Secondary | ICD-10-CM | POA: Diagnosis present

## 2018-08-21 DIAGNOSIS — R739 Hyperglycemia, unspecified: Secondary | ICD-10-CM | POA: Diagnosis not present

## 2018-08-21 DIAGNOSIS — Z86718 Personal history of other venous thrombosis and embolism: Secondary | ICD-10-CM

## 2018-08-21 DIAGNOSIS — R55 Syncope and collapse: Secondary | ICD-10-CM

## 2018-08-21 DIAGNOSIS — Z452 Encounter for adjustment and management of vascular access device: Secondary | ICD-10-CM | POA: Diagnosis not present

## 2018-08-21 DIAGNOSIS — B370 Candidal stomatitis: Secondary | ICD-10-CM | POA: Diagnosis present

## 2018-08-21 DIAGNOSIS — E11649 Type 2 diabetes mellitus with hypoglycemia without coma: Secondary | ICD-10-CM | POA: Diagnosis not present

## 2018-08-21 DIAGNOSIS — D6481 Anemia due to antineoplastic chemotherapy: Secondary | ICD-10-CM | POA: Diagnosis not present

## 2018-08-21 DIAGNOSIS — K760 Fatty (change of) liver, not elsewhere classified: Secondary | ICD-10-CM | POA: Diagnosis present

## 2018-08-21 DIAGNOSIS — J9601 Acute respiratory failure with hypoxia: Secondary | ICD-10-CM | POA: Diagnosis present

## 2018-08-21 DIAGNOSIS — T451X5A Adverse effect of antineoplastic and immunosuppressive drugs, initial encounter: Secondary | ICD-10-CM | POA: Diagnosis present

## 2018-08-21 DIAGNOSIS — E1110 Type 2 diabetes mellitus with ketoacidosis without coma: Principal | ICD-10-CM | POA: Diagnosis present

## 2018-08-21 DIAGNOSIS — B3742 Candidal balanitis: Secondary | ICD-10-CM | POA: Diagnosis not present

## 2018-08-21 DIAGNOSIS — R0602 Shortness of breath: Secondary | ICD-10-CM | POA: Diagnosis not present

## 2018-08-21 DIAGNOSIS — E876 Hypokalemia: Secondary | ICD-10-CM

## 2018-08-21 DIAGNOSIS — Z79899 Other long term (current) drug therapy: Secondary | ICD-10-CM | POA: Diagnosis not present

## 2018-08-21 DIAGNOSIS — I371 Nonrheumatic pulmonary valve insufficiency: Secondary | ICD-10-CM | POA: Diagnosis not present

## 2018-08-21 DIAGNOSIS — K761 Chronic passive congestion of liver: Secondary | ICD-10-CM | POA: Diagnosis present

## 2018-08-21 DIAGNOSIS — D61810 Antineoplastic chemotherapy induced pancytopenia: Secondary | ICD-10-CM | POA: Diagnosis present

## 2018-08-21 DIAGNOSIS — Z9889 Other specified postprocedural states: Secondary | ICD-10-CM | POA: Diagnosis not present

## 2018-08-21 DIAGNOSIS — K5900 Constipation, unspecified: Secondary | ICD-10-CM | POA: Diagnosis not present

## 2018-08-21 DIAGNOSIS — D709 Neutropenia, unspecified: Secondary | ICD-10-CM | POA: Diagnosis not present

## 2018-08-21 DIAGNOSIS — Z794 Long term (current) use of insulin: Secondary | ICD-10-CM

## 2018-08-21 DIAGNOSIS — R7989 Other specified abnormal findings of blood chemistry: Secondary | ICD-10-CM

## 2018-08-21 LAB — CBG MONITORING, ED
Glucose-Capillary: >600 mg/dL (ref 70–99)
Glucose-Capillary: >600 mg/dL (ref 70–99)
Glucose-Capillary: 517 mg/dL (ref 70–99)
Glucose-Capillary: 465 mg/dL — ABNORMAL HIGH (ref 70–99)

## 2018-08-21 LAB — COMPREHENSIVE METABOLIC PANEL
ALT: 155 U/L — ABNORMAL HIGH (ref 0–44)
AST: 120 U/L — ABNORMAL HIGH (ref 15–41)
Albumin: 3.3 g/dL — ABNORMAL LOW (ref 3.5–5.0)
Alkaline Phosphatase: 134 U/L — ABNORMAL HIGH (ref 38–126)
Anion gap: 14 (ref 5–15)
BUN: 47 mg/dL — ABNORMAL HIGH (ref 6–20)
CO2: 19 mmol/L — ABNORMAL LOW (ref 22–32)
Calcium: 7.9 mg/dL — ABNORMAL LOW (ref 8.9–10.3)
Chloride: 102 mmol/L (ref 98–111)
Creatinine, Ser: 1.33 mg/dL — ABNORMAL HIGH (ref 0.61–1.24)
GFR calc Af Amer: >60 mL/min (ref >60)
GFR calc non Af Amer: 58 mL/min — ABNORMAL LOW (ref >60)
Glucose, Bld: 1047 mg/dL (ref 70–99)
Potassium: 4.6 mmol/L (ref 3.5–5.1)
Sodium: 135 mmol/L (ref 135–145)
Total Bilirubin: 0.8 mg/dL (ref 0.3–1.2)
Total Protein: 5.5 g/dL — ABNORMAL LOW (ref 6.5–8.1)

## 2018-08-21 LAB — CBC WITH DIFFERENTIAL/PLATELET
Abs Immature Granulocytes: 0 10*3/uL (ref 0.00–0.07)
Band Neutrophils: 7 %
Basophils Absolute: 0 10*3/uL (ref 0.0–0.1)
Basophils Relative: 0 %
Eosinophils Absolute: 0 10*3/uL (ref 0.0–0.5)
Eosinophils Relative: 0 %
HCT: 37.2 % — ABNORMAL LOW (ref 39.0–52.0)
Hemoglobin: 11.8 g/dL — ABNORMAL LOW (ref 13.0–17.0)
Lymphocytes Relative: 4 %
Lymphs Abs: 0.1 10*3/uL — ABNORMAL LOW (ref 0.7–4.0)
MCH: 32.9 pg (ref 26.0–34.0)
MCHC: 31.7 g/dL (ref 30.0–36.0)
MCV: 103.6 fL — ABNORMAL HIGH (ref 80.0–100.0)
Monocytes Absolute: 0.2 10*3/uL (ref 0.1–1.0)
Monocytes Relative: 6 %
Neutro Abs: 3.1 10*3/uL (ref 1.7–7.7)
Neutrophils Relative %: 83 %
Platelets: 105 10*3/uL — ABNORMAL LOW (ref 150–400)
RBC: 3.59 MIL/uL — ABNORMAL LOW (ref 4.22–5.81)
RDW: 15.5 % (ref 11.5–15.5)
WBC: 3.4 10*3/uL — ABNORMAL LOW (ref 4.0–10.5)
nRBC: 5.1 % — ABNORMAL HIGH (ref 0.0–0.2)
nRBC: 6 /100 WBC — ABNORMAL HIGH

## 2018-08-21 LAB — URINALYSIS, ROUTINE W REFLEX MICROSCOPIC
Bacteria, UA: NONE SEEN
Bilirubin Urine: NEGATIVE
Glucose, UA: >=500 mg/dL — AB
Hgb urine dipstick: NEGATIVE
Ketones, ur: NEGATIVE mg/dL
Leukocytes,Ua: NEGATIVE
Nitrite: NEGATIVE
Protein, ur: NEGATIVE mg/dL
Specific Gravity, Urine: 1.029 (ref 1.005–1.030)
pH: 5 (ref 5.0–8.0)

## 2018-08-21 LAB — BLOOD GAS, VENOUS
Acid-base deficit: 3.5 mmol/L — ABNORMAL HIGH (ref 0.0–2.0)
Bicarbonate: 22.5 mmol/L (ref 20.0–28.0)
O2 Saturation: 41.9 %
Patient temperature: 98.6
pCO2, Ven: 46.3 mmHg (ref 44.0–60.0)
pH, Ven: 7.307 (ref 7.250–7.430)

## 2018-08-21 MED ORDER — ONDANSETRON HCL 4 MG PO TABS: 4.0000 mg | ORAL_TABLET | Freq: Four times a day (QID) | ORAL | Status: DC | PRN

## 2018-08-21 MED ORDER — PRAMIPEXOLE DIHYDROCHLORIDE 1 MG PO TABS
1.0000 mg | ORAL_TABLET | Freq: Every day | ORAL | Status: DC
  Administered 2018-08-22: 01:00:00 1 mg via ORAL
  Filled 2018-08-21: qty 1
  Filled 2018-08-21: qty 4

## 2018-08-21 MED ORDER — INSULIN REGULAR HUMAN 100 UNIT/ML IJ SOLN
10.0000 [IU] | Freq: Once | INTRAMUSCULAR | Status: AC
  Administered 2018-08-21: 10 [IU] via SUBCUTANEOUS
  Filled 2018-08-21: qty 10

## 2018-08-21 MED ORDER — LORAZEPAM 0.5 MG PO TABS: 0.5000 mg | ORAL_TABLET | Freq: Four times a day (QID) | ORAL | Status: DC | PRN

## 2018-08-21 MED ORDER — INSULIN REGULAR(HUMAN) IN NACL 100-0.9 UT/100ML-% IV SOLN
INTRAVENOUS | Status: DC
  Administered 2018-08-21: 21:00:00 5.4 [IU]/h via INTRAVENOUS
  Filled 2018-08-21: qty 100

## 2018-08-21 MED ORDER — MORPHINE SULFATE 15 MG PO TABS
15.0000 mg | ORAL_TABLET | ORAL | Status: DC | PRN
  Administered 2018-08-24 – 2018-08-27 (×7): 15 mg via ORAL
  Filled 2018-08-21 (×8): qty 1

## 2018-08-21 MED ORDER — BLOOD GLUCOSE MONITOR KIT: PACK | 0 refills | Status: DC

## 2018-08-21 MED ORDER — ONDANSETRON HCL 4 MG/2ML IJ SOLN: 4.0000 mg | Freq: Four times a day (QID) | INTRAMUSCULAR | Status: DC | PRN

## 2018-08-21 MED ORDER — POTASSIUM CHLORIDE CRYS ER 20 MEQ PO TBCR
40.0000 meq | EXTENDED_RELEASE_TABLET | Freq: Once | ORAL | Status: AC
  Administered 2018-08-21: 40 meq via ORAL
  Filled 2018-08-21: qty 2

## 2018-08-21 MED ORDER — CLOTRIMAZOLE 10 MG MT TROC
10.0000 mg | Freq: Every day | OROMUCOSAL | Status: DC
  Administered 2018-08-22 – 2018-08-25 (×13): 10 mg via ORAL
  Filled 2018-08-21 (×33): qty 1

## 2018-08-21 MED ORDER — SODIUM CHLORIDE 0.9 % IV SOLN
1000.0000 mL | INTRAVENOUS | Status: DC
  Administered 2018-08-21: 20:00:00 1000 mL via INTRAVENOUS

## 2018-08-21 MED ORDER — SODIUM FLUORIDE 1.1 % DT CREA: 1.0000 "application " | TOPICAL_CREAM | Freq: Every day | DENTAL | Status: DC

## 2018-08-21 MED ORDER — SODIUM CHLORIDE 0.9 % IV BOLUS
1000.0000 mL | Freq: Once | INTRAVENOUS | Status: AC
  Administered 2018-08-21: 20:00:00 1000 mL via INTRAVENOUS

## 2018-08-21 MED ORDER — LIDOCAINE VISCOUS HCL 2 % MT SOLN
5.0000 mL | Freq: Four times a day (QID) | OROMUCOSAL | Status: DC | PRN
  Administered 2018-08-24: 5 mL via OROMUCOSAL
  Filled 2018-08-21: qty 15

## 2018-08-21 MED ORDER — INSULIN GLARGINE 100 UNITS/ML SOLOSTAR PEN: 10.0000 [IU] | PEN_INJECTOR | Freq: Every day | SUBCUTANEOUS | 5 refills | Status: DC

## 2018-08-21 MED ORDER — SODIUM CHLORIDE 0.9 % IV SOLN
Freq: Once | INTRAVENOUS | Status: AC
  Administered 2018-08-21: 16:00:00 via INTRAVENOUS
  Filled 2018-08-21: qty 250

## 2018-08-21 MED ORDER — HEPARIN SODIUM (PORCINE) 5000 UNIT/ML IJ SOLN
5000.0000 [IU] | Freq: Three times a day (TID) | INTRAMUSCULAR | Status: DC
  Administered 2018-08-22 (×2): 5000 [IU] via SUBCUTANEOUS
  Filled 2018-08-21 (×2): qty 1

## 2018-08-21 MED ORDER — FLUCONAZOLE 100 MG PO TABS
100.0000 mg | ORAL_TABLET | Freq: Every day | ORAL | Status: DC
  Administered 2018-08-22: 100 mg via ORAL
  Filled 2018-08-21: qty 1

## 2018-08-21 MED ORDER — PROMOD PO LIQD: 30.0000 mL | Freq: Three times a day (TID) | ORAL | Status: DC

## 2018-08-21 MED ORDER — SODIUM CHLORIDE 0.9 % IV BOLUS (SEPSIS)
1000.0000 mL | Freq: Once | INTRAVENOUS | Status: AC
  Administered 2018-08-21: 1000 mL via INTRAVENOUS

## 2018-08-21 MED ORDER — BISACODYL 5 MG PO TBEC
5.0000 mg | DELAYED_RELEASE_TABLET | Freq: Two times a day (BID) | ORAL | Status: DC
  Filled 2018-08-21: qty 1

## 2018-08-21 MED ORDER — ONDANSETRON HCL 4 MG PO TABS: 8.0000 mg | ORAL_TABLET | Freq: Two times a day (BID) | ORAL | Status: DC | PRN

## 2018-08-21 NOTE — ED Notes (Signed)
Date and time results received: 08/21/18 0812   Test: glucose  Critical Value: 1047  Name of Provider Notified: Tomi Bamberger, MD

## 2018-08-21 NOTE — H&P (Signed)
History and Physical   Glenn Goodman [facility] HTD:[id number] DOB: 1960/07/11 DOA: 08/21/2018  Referring MD/NP/PA: Dr. Tomi Bamberger  PCP: [name], MD   Outpatient Specialists: Dr. Rudie Meyer, oncology  Patient coming from: Home  Chief Complaint: Hyperglycemia  HPI: Glenn Goodman is a 58 y.o. male with medical history significant of squamous cell carcinoma of the left tonsil, chemotherapy with induced anemia and thrombocytopenia, penile candidiasis, sensorineural hearing loss, osteoarthritis, restless leg syndrome and obstructive sleep apnea who was seen in the ER only yesterday with new hyperglycemia.  Patient was not in nondiabetic however he has been receiving Decadron with his chemotherapy.  His last chemo was last week.  He was found to have blood sugar of over thousand.  Patient was initiated on Lantus and metformin only yesterday.  He came back due to worsening blood sugar today.  He has not been taking any steroids.  He called his oncologist who sent him to the ER.  Patient has had near syncope today.  He was visibly dehydrated.  Denied any shortness of breath or cough.  Denied any exposure to anyone with significant disease.  His blood sugar was more than 8000 but not acidotic.  Is being admitted with hyperosmolar nonketotic hyperglycemia..  ED Course: Temperature is 97.3 with blood pressure 147/131 pulse 139 respirate of 29 initial oxygen sat 86% on room air.  His white count of 3.4 hemoglobin 11.8.  Platelets 105.  Chemistry showed blood sugar 1047 creatinine 1.33 gap of 14.  LFTs elevated with AST 120 ALT 155 alkaline phosphatase 134 and total bilirubin 0.8.  Patient was initiated on IV insulin, IV fluids and is being admitted with hypoglycemia.  Review of Systems: As per HPI otherwise 10 point review of systems negative.    Past Medical History:  Diagnosis Date  . Arthritis   . Cancer ([employer])   . OSA on CPAP   . Restless leg syndrome     Past Surgical History:  Procedure Laterality Date   . DIRECT LARYNGOSCOPY Left 04/22/2018   Dr. Janace Hoard  . FACIAL FRACTURE SURGERY Left 1986  . IR GASTROSTOMY TUBE MOD SED  06/30/2018  . IR IMAGING GUIDED PORT INSERTION  06/30/2018  . MODIFIED RADICAL NECK DISSECTION Left 05/26/2018   Dr. Nicolette Bang, [facility]  . transoral robotic surgery  05/26/2018   [facility], Dr. Nicolette Bang [facility]     reports that he has never smoked. He has never used smokeless tobacco. He reports previous alcohol use. He reports that he does not use drugs.  No Known Allergies  Family History  Problem Relation Age of Onset  . Hypertension Mother   . Prostate cancer Father      Prior to Admission medications   Medication Sig Start Date End Date Taking? Authorizing Provider  bisacodyl (DULCOLAX) 5 MG EC tablet Take 5 mg by mouth 2 (two) times daily.   Yes [provider]  clotrimazole (MYCELEX) 10 MG troche Take 1 lozenge (10 mg total) by mouth 5 (five) times daily for 30 days. 07/30/18 08/29/18 Yes Tish Men, MD  lidocaine (XYLOCAINE) 2 % solution Patient: Mix 1part 2% viscous lidocaine, 1part H20. Swish & swallow 54m of diluted mixture, 338m before meals and at bedtime, up to QID Patient taking differently: Use as directed 10 mLs in the mouth or throat 4 (four) times daily as needed for mouth pain. Mix 1 part 2% viscous lidocaine, 1 part H20. Swish & swallow 10 mL of diluted mixture, 30 min before meals and at bedtime, up to  QID 07/14/18  Yes Eppie Gibson, MD  LORazepam (ATIVAN) 0.5 MG tablet Take 1 tablet (0.5 mg total) by mouth every 6 (six) hours as needed (Nausea or vomiting). 07/30/18  Yes Tish Men, MD  metFORMIN (GLUCOPHAGE) 500 MG tablet Take 1 tablet (500 mg total) by mouth 2 (two) times daily with a meal. 08/20/18  Yes Daleen Bo, MD  morphine (MSIR) 15 MG tablet Take 1 tablet (15 mg total) by mouth every 4 (four) hours as needed for severe pain. 08/13/18  Yes Tish Men, MD  Neomycin-Bacitracin-Polymyxin (NEOSPORIN EX) Apply 1 application topically daily as needed  (neck wound).   Yes [provider]  Nutritional Supplements (FEEDING SUPPLEMENT, OSMOLITE 1.5 CAL,) LIQD Give 1.5 bottles Osmolite 1.5 Bid via PEG and 2 bottles Osmolite 1.5 via PEG for total of 7 bottles daily.  Give 60 cc free water before and after bolus feedings 4 times daily.  In addition consume or give via PEG 240 mL free water 3 times daily.  Please send formula and split gauze to patient.  He does not need other supplies at this time. Patient taking differently: Take 355.5-474 mLs by mouth See admin instructions. Take 474 ml in the morning, 474 ml at lunch, 355.5 ml at dinner and 355.5 ml at bedtime via PEG for total of 7 bottles daily. Give 60 cc free water before and after bolus feedings 4 times daily.  In addition consume or give via PEG 240 mL free water 3 times daily.  Please send formula and split gauze to patient 08/18/18  Yes Eppie Gibson, MD  Nutritional Supplements (PROMOD) LIQD Add 30 mL Promod TID. Continue 7 bottles Osmolite 1.5 daily. Patient taking differently: Take 30 mLs by mouth 3 (three) times daily.  08/18/18  Yes Eppie Gibson, MD  ondansetron (ZOFRAN) 8 MG tablet Take 1 tablet (8 mg total) by mouth 2 (two) times daily as needed. Start on the third day after chemotherapy. Patient taking differently: Take 8 mg by mouth 2 (two) times daily as needed for nausea or vomiting. Start on the third day after chemotherapy. 06/18/18  Yes Tish Men, MD  pramipexole (MIRAPEX) 1 MG tablet Take 1 mg by mouth at bedtime. 08/08/18  Yes [provider]  sodium fluoride (PREVIDENT 5000 PLUS) 1.1 % CREA dental cream Apply to tooth brush. Brush teeth for 2 minutes. Spit out excess. DO NOT rinse afterwards. Repeat nightly. Patient taking differently: Place 1 application onto teeth at bedtime. Apply to tooth brush. Brush teeth for 2 minutes. Spit out excess. DO NOT rinse afterwards. Repeat nightly. 04/30/18  Yes Lenn Cal, DDS  Wound Dressings (SONAFINE EX) Apply 1  application topically daily as needed (neck wound).   Yes [provider]  blood glucose meter kit and supplies KIT Dispense based on patient and insurance preference. Use up to four times daily as directed. (FOR ICD-9 250.00, 250.01).   Monitor 2-3 times per day. 08/21/18   Tish Men, MD  dexamethasone (DECADRON) 4 MG tablet Take 2 tablets by mouth once a day on the day after chemotherapy and then take 2 tablets two times a day for 2 days. Take with food. Patient not taking: Reported on 08/21/2018 06/23/18   Tish Men, MD  fluconazole (DIFLUCAN) 100 MG tablet Take 1 tablet (100 mg total) by mouth daily for 7 days. 08/20/18 08/27/18  Tish Men, MD  insulin glargine (LANTUS) 100 unit/mL SOPN Inject 0.1 mLs (10 Units total) into the skin daily for 30 days. 08/21/18 09/20/18  Tish Men, MD  lidocaine-prilocaine (EMLA) cream Apply to affected area once Patient not taking: Reported on 08/21/2018 06/18/18   Tish Men, MD  phenazopyridine (PYRIDIUM) 200 MG tablet Take 1 tablet (200 mg total) by mouth 3 (three) times daily as needed for pain. Patient not taking: Reported on 07/23/2018 07/10/18   Harle Stanford., PA-C  prochlorperazine (COMPAZINE) 10 MG tablet Take 1 tablet (10 mg total) by mouth every 6 (six) hours as needed (Nausea or vomiting). Patient not taking: Reported on 08/20/2018 06/18/18   Tish Men, MD  silver sulfADIAZINE (SILVADENE) 1 % cream Apply 1 application topically 2 (two) times daily.    [provider]    Physical Exam: Vitals:   08/21/18 2019 08/21/18 2100 08/21/18 2130 08/21/18 2230  BP: (!) 117/100 (!) 116/102 (!) 109/93 (!) 147/131  Pulse: (!) 124 (!) 128  (!) 118  Resp: (!) 23 15 (!) 22 18  Temp:      TempSrc:      SpO2: 100% (!) 86% 100% 100%  Weight:      Height:          Constitutional: NAD, calm, comfortable, acutely ill looking Vitals:   08/21/18 2019 08/21/18 2100 08/21/18 2130 08/21/18 2230  BP: (!) 117/100 (!) 116/102 (!) 109/93 (!) 147/131  Pulse: (!) 124 (!)  128  (!) 118  Resp: (!) 23 15 (!) 22 18  Temp:      TempSrc:      SpO2: 100% (!) 86% 100% 100%  Weight:      Height:       Eyes: PERRL, lids and conjunctivae normal ENMT: Mucous membranes are dry. Posterior pharynx clear of any exudate or lesions.Normal dentition.  Neck: normal, supple, no masses, no thyromegaly Respiratory: clear to auscultation bilaterally, no wheezing, diffuse crackles. Normal respiratory effort. No accessory muscle use.  Cardiovascular: Regular rate and rhythm, no murmurs / rubs / gallops. No extremity edema. 2+ pedal pulses. No carotid bruits.  Abdomen: no tenderness, no masses palpated. No hepatosplenomegaly. Bowel sounds positive.  Musculoskeletal: no clubbing / cyanosis. No joint deformity upper and lower extremities. Good ROM, no contractures. Normal muscle tone.  Skin: no rashes, lesions, ulcers. No induration Neurologic: CN 2-12 grossly intact. Sensation intact, DTR normal. Strength 5/5 in all 4.  Psychiatric: Normal judgment and insight. Alert and oriented x 3. Normal mood.     Labs on Admission: I have personally reviewed following labs and imaging studies  CBC: Recent Labs  Lab 08/20/18 1408 08/20/18 1602 08/20/18 1612 08/21/18 1817  WBC 2.4* 2.1*  --  3.4*  NEUTROABS 2.0 1.7  --  3.1  HGB 12.8* 12.4* 12.9* 11.8*  HCT 38.8* 37.8* 38.0* 37.2*  MCV 98.0 98.7  --  103.6*  PLT 129* 126*  --  035*   Basic Metabolic Panel: Recent Labs  Lab 08/20/18 1408 08/20/18 1612 08/21/18 1817  NA 139 135 135  K 4.4 4.7 4.6  CL 98  --  102  CO2 25  --  19*  GLUCOSE 850*  --  1,047*  BUN 42*  --  47*  CREATININE 1.60* 0.90 1.33*  CALCIUM 9.5  --  7.9*  MG 2.3  --   --    GFR: Estimated Creatinine Clearance: 57.9 mL/min (A) (by C-G formula based on SCr of 1.33 mg/dL (H)). Liver Function Tests: Recent Labs  Lab 08/20/18 1603 08/21/18 1817  AST 18 120*  ALT 41 155*  ALKPHOS 131* 134*  BILITOT 0.4 0.8  PROT 6.8 5.5*  ALBUMIN 3.9 3.3*   No  results for input(s): LIPASE, AMYLASE in the last 168 hours. No results for input(s): AMMONIA in the last 168 hours. Coagulation Profile: No results for input(s): INR, PROTIME in the last 168 hours. Cardiac Enzymes: No results for input(s): CKTOTAL, CKMB, CKMBINDEX, TROPONINI in the last 168 hours. BNP (last 3 results) No results for input(s): PROBNP in the last 8760 hours. HbA1C: No results for input(s): HGBA1C in the last 72 hours. CBG: Recent Labs  Lab 08/20/18 1949 08/20/18 2313 08/21/18 1800 08/21/18 2054 08/21/18 2206  GLUCAP 530* 373* >600* >600* 517*   Lipid Profile: No results for input(s): CHOL, HDL, LDLCALC, TRIG, CHOLHDL, LDLDIRECT in the last 72 hours. Thyroid Function Tests: No results for input(s): TSH, T4TOTAL, FREET4, T3FREE, THYROIDAB in the last 72 hours. Anemia Panel: No results for input(s): VITAMINB12, FOLATE, FERRITIN, TIBC, IRON, RETICCTPCT in the last 72 hours. Urine analysis:    Component Value Date/Time   COLORURINE YELLOW 08/21/2018 1754   APPEARANCEUR CLEAR 08/21/2018 1754   LABSPEC 1.029 08/21/2018 1754   PHURINE 5.0 08/21/2018 1754   GLUCOSEU >=500 (A) 08/21/2018 1754   HGBUR NEGATIVE 08/21/2018 1754   BILIRUBINUR NEGATIVE 08/21/2018 1754   KETONESUR NEGATIVE 08/21/2018 1754   PROTEINUR NEGATIVE 08/21/2018 1754   NITRITE NEGATIVE 08/21/2018 1754   LEUKOCYTESUR NEGATIVE 08/21/2018 1754   Sepsis Labs: _0 (procalcitonin:4,lacticidven:4) )No results found for this or any previous visit (from the past 240 hour(s)).   Radiological Exams on Admission: Dg Chest Port 1 View  Result Date: 08/21/2018 CLINICAL DATA:  Head neck cancer. Hyperglycemia. EXAM: PORTABLE CHEST 1 VIEW COMPARISON:  None. FINDINGS: The heart size and mediastinal contours are within normal limits. Both lungs are clear. The visualized skeletal structures are unremarkable. Port-A-Cath tip proximal RIGHT atrium. IMPRESSION: No active disease. Electronically Signed   By: Staci Righter M.D.   On: 08/21/2018 18:42      Assessment/Plan Principal Problem:   Hyperglycemia Active Problems:   Squamous cell carcinoma of left tonsil (HCC)   Sensorineural hearing loss (SNHL) of both ears   Oral candidiasis   AKI (acute kidney injury) ([place])     #1 hyperglycemia: Hyperosmolar nonketotic.  Continue IV insulin with fluids.  Patient will be admitted to progressive care unit.  We will transition patient to subcutaneous insulin after titration.  Aggressive fluid resuscitation.  Check hemoglobin A1c.  #2 dehydration: Patient's mucous membranes are dry.  Aggressively hydrate and monitor.  #3 squamous cell carcinoma of the left tonsil: Patient being followed by oncology.  Chemo as scheduled.  Pancytopenia from his chemotherapy which is improving.  #4 acute kidney injury: Secondary to dehydration.  Continue monitoring renal function.   DVT prophylaxis: Heparin Code Status: Full code Family Communication: Discussed care with patient no family around Disposition Plan: Home Consults called: None Admission status: Inpatient  Severity of Illness: The appropriate patient status for this patient is INPATIENT. Inpatient status is judged to be reasonable and necessary in order to provide the required intensity of service to ensure the patient's safety. The patient's presenting symptoms, physical exam findings, and initial radiographic and laboratory data in the context of their chronic comorbidities is felt to place them at high risk for further clinical deterioration. Furthermore, it is not anticipated that the patient will be medically stable for discharge from the hospital within 2 midnights of admission. The following factors support the patient status of inpatient.   " The patient's presenting symptoms include polyuria and polydipsia. "  The worrisome physical exam findings include dry mucous membrane with Port-A-Cath in place. " The initial radiographic and laboratory data  are worrisome because of blood sugar of 1200. " The chronic co-morbidities include history of head and neck cancer.   * I certify that at the point of admission it is my clinical judgment that the patient will require inpatient hospital care spanning beyond 2 midnights from the point of admission due to high intensity of service, high risk for further deterioration and high frequency of surveillance required.Barbette Merino MD [facility] Pager [phone]  If 7PM-7AM, please contact night-coverage [email or web address] Password [email or web address]  08/21/2018, 11:01 PM

## 2018-08-21 NOTE — ED Provider Notes (Addendum)
[facility] DEPT Provider Note   CSN: [id number] Arrival date & time: 08/21/18  1734    History   Chief Complaint Chief Complaint  Patient presents with  . Near Syncope  . Hyperglycemia    HPI Glenn Goodman is a 58 y.o. male.     HPI Pt was recently in the ED for evaluation of hyperglycemia.    He has been on steroids and has been eating a lot of sugar.  Pt is currently receiving treatment for head and neck cancer.  Pt states he was supposed to start taking dm medications but was not able to get them filled from yesterday.  His sugars have been high still in the 500s. He went back to the cancer center today for his radiation tx.  He went to the cancer center after treatment and had a fainting spell after he tried to stand up and urinate.   Pt was sent to the ED for further evaluation.  He was given a dose of insulin in the cancer center.  Past Medical History:  Diagnosis Date  . Arthritis   . Cancer ([place])   . OSA on CPAP   . Restless leg syndrome     Patient Active Problem List   Diagnosis Date Noted  . Protein malnutrition ([place]) 07/30/2018  . Mucositis due to chemotherapy 07/23/2018  . Chemotherapy-induced nausea 07/17/2018  . Chemotherapy-induced diarrhea 07/17/2018  . Oral candidiasis 07/17/2018  . Port-A-Cath in place 07/09/2018  . Sensorineural hearing loss (SNHL) of both ears 06/18/2018  . Odynophagia 06/18/2018  . Malignant neoplasm of base of tongue ([place]) 05/09/2018  . 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 DIRECT LARYNGOSCOPY Left 04/22/2018   Dr. Janace Hoard  . FACIAL FRACTURE SURGERY Left 1986  . IR GASTROSTOMY TUBE MOD SED  06/30/2018  . IR IMAGING GUIDED PORT INSERTION  06/30/2018  . MODIFIED RADICAL NECK DISSECTION Left 05/26/2018   Dr. Nicolette Bang, [facility]  . transoral robotic surgery  05/26/2018   [facility], Dr. Nicolette Bang [facility]        Home Medications     Prior to Admission medications   Medication Sig Start Date End Date Taking? Authorizing Provider  bisacodyl (DULCOLAX) 5 MG EC tablet Take 5 mg by mouth 2 (two) times daily.   Yes [provider]  clotrimazole (MYCELEX) 10 MG troche Take 1 lozenge (10 mg total) by mouth 5 (five) times daily for 30 days. 07/30/18 08/29/18 Yes Tish Men, MD  lidocaine (XYLOCAINE) 2 % solution Patient: Mix 1part 2% viscous lidocaine, 1part H20. Swish & swallow 67m of diluted mixture, 391m before meals and at bedtime, up to QID Patient taking differently: Use as directed 10 mLs in the mouth or throat 4 (four) times daily as needed for mouth pain. Mix 1 part 2% viscous lidocaine, 1 part H20. Swish & swallow 10 mL of diluted mixture, 30 min before meals and at bedtime, up to QID 07/14/18  Yes SqEppie GibsonMD  LORazepam (ATIVAN) 0.5 MG tablet Take 1 tablet (0.5 mg total) by mouth every 6 (six) hours as needed (Nausea or vomiting). 07/30/18  Yes ZhTish MenMD  metFORMIN (GLUCOPHAGE) 500 MG tablet Take 1 tablet (500 mg total) by mouth 2 (two) times daily with a meal. 08/20/18  Yes WeDaleen BoMD  morphine (MSIR) 15 MG tablet Take 1 tablet (15 mg total) by mouth every 4 (four) hours as needed for severe  pain. 08/13/18  Yes Tish Men, MD  Neomycin-Bacitracin-Polymyxin (NEOSPORIN EX) Apply 1 application topically daily as needed (neck wound).   Yes [provider]  Nutritional Supplements (FEEDING SUPPLEMENT, OSMOLITE 1.5 CAL,) LIQD Give 1.5 bottles Osmolite 1.5 Bid via PEG and 2 bottles Osmolite 1.5 via PEG for total of 7 bottles daily.  Give 60 cc free water before and after bolus feedings 4 times daily.  In addition consume or give via PEG 240 mL free water 3 times daily.  Please send formula and split gauze to patient.  He does not need other supplies at this time. Patient taking differently: Take 355.5-474 mLs by mouth See admin instructions. Take 474 ml in the morning, 474 ml at lunch, 355.5 ml at dinner and  355.5 ml at bedtime via PEG for total of 7 bottles daily. Give 60 cc free water before and after bolus feedings 4 times daily.  In addition consume or give via PEG 240 mL free water 3 times daily.  Please send formula and split gauze to patient 08/18/18  Yes Eppie Gibson, MD  Nutritional Supplements (PROMOD) LIQD Add 30 mL Promod TID. Continue 7 bottles Osmolite 1.5 daily. Patient taking differently: Take 30 mLs by mouth 3 (three) times daily.  08/18/18  Yes Eppie Gibson, MD  ondansetron (ZOFRAN) 8 MG tablet Take 1 tablet (8 mg total) by mouth 2 (two) times daily as needed. Start on the third day after chemotherapy. Patient taking differently: Take 8 mg by mouth 2 (two) times daily as needed for nausea or vomiting. Start on the third day after chemotherapy. 06/18/18  Yes Tish Men, MD  pramipexole (MIRAPEX) 1 MG tablet Take 1 mg by mouth at bedtime. 08/08/18  Yes [provider]  sodium fluoride (PREVIDENT 5000 PLUS) 1.1 % CREA dental cream Apply to tooth brush. Brush teeth for 2 minutes. Spit out excess. DO NOT rinse afterwards. Repeat nightly. Patient taking differently: Place 1 application onto teeth at bedtime. Apply to tooth brush. Brush teeth for 2 minutes. Spit out excess. DO NOT rinse afterwards. Repeat nightly. 04/30/18  Yes Lenn Cal, DDS  Wound Dressings (SONAFINE EX) Apply 1 application topically daily as needed (neck wound).   Yes [provider]  blood glucose meter kit and supplies KIT Dispense based on patient and insurance preference. Use up to four times daily as directed. (FOR ICD-9 250.00, 250.01).   Monitor 2-3 times per day. 08/21/18   Tish Men, MD  dexamethasone (DECADRON) 4 MG tablet Take 2 tablets by mouth once a day on the day after chemotherapy and then take 2 tablets two times a day for 2 days. Take with food. Patient not taking: Reported on 08/21/2018 06/23/18   Tish Men, MD  fluconazole (DIFLUCAN) 100 MG tablet Take 1 tablet (100 mg total) by mouth  daily for 7 days. 08/20/18 08/27/18  Tish Men, MD  insulin glargine (LANTUS) 100 unit/mL SOPN Inject 0.1 mLs (10 Units total) into the skin daily for 30 days. 08/21/18 09/20/18  Tish Men, MD  lidocaine-prilocaine (EMLA) cream Apply to affected area once Patient not taking: Reported on 08/21/2018 06/18/18   Tish Men, MD  phenazopyridine (PYRIDIUM) 200 MG tablet Take 1 tablet (200 mg total) by mouth 3 (three) times daily as needed for pain. Patient not taking: Reported on 07/23/2018 07/10/18   Harle Stanford., PA-C  prochlorperazine (COMPAZINE) 10 MG tablet Take 1 tablet (10 mg total) by mouth every 6 (six) hours as needed (Nausea or vomiting). Patient not  taking: Reported on 08/20/2018 06/18/18   Tish Men, MD  silver sulfADIAZINE (SILVADENE) 1 % cream Apply 1 application topically 2 (two) times daily.    [provider]    Family History Family History  Problem Relation Age of Onset  . Hypertension Mother   . Prostate cancer Father     Social History Social History   Tobacco Use  . Smoking status: Never Smoker  . Smokeless tobacco: Never Used  Substance Use Topics  . Alcohol use: Not Currently  . Drug use: Never     Allergies   Patient has no known allergies.   Review of Systems Review of Systems  Constitutional: Negative for fever.  HENT:       Trouble eating and drinking   Gastrointestinal: Negative for diarrhea and vomiting.  All other systems reviewed and are negative.    Physical Exam Updated Vital Signs BP (!) 117/100 (BP Location: Right Arm)   Pulse (!) 124   Temp (!) 97.3 F (36.3 C) (Axillary)   Resp (!) 23   Ht 1.727 m (5' 8" )   Wt 74.8 kg   SpO2 100%   BMI 25.09 kg/m   Physical Exam Vitals signs and nursing note reviewed.  Constitutional:      General: He is not in acute distress.    Appearance: He is well-developed.  HENT:     Head: Normocephalic and atraumatic.     Right Ear: External ear normal.     Left Ear: External ear normal.      Mouth/Throat:     Mouth: Mucous membranes are dry.  Eyes:     General: No scleral icterus.       Right eye: No discharge.        Left eye: No discharge.     Conjunctiva/sclera: Conjunctivae normal.  Neck:     Musculoskeletal: Neck supple.     Trachea: No tracheal deviation.  Cardiovascular:     Rate and Rhythm: Regular rhythm. Tachycardia present.  Pulmonary:     Effort: Pulmonary effort is normal. No respiratory distress.     Breath sounds: Normal breath sounds. No stridor. No wheezing or rales.  Abdominal:     General: Bowel sounds are normal. There is no distension.     Palpations: Abdomen is soft.     Tenderness: There is no abdominal tenderness. There is no guarding or rebound.  Musculoskeletal:        General: No tenderness.  Skin:    General: Skin is warm and dry.     Findings: No rash.  Neurological:     Mental Status: He is alert.     Cranial Nerves: No cranial nerve deficit (no facial droop, extraocular movements intact, no slurred speech).     Sensory: No sensory deficit.     Motor: No abnormal muscle tone or seizure activity.     Coordination: Coordination normal.      ED Treatments / Results  Labs (all labs ordered are listed, but only abnormal results are displayed) Labs Reviewed  CBC WITH DIFFERENTIAL/PLATELET - Abnormal; Notable for the following components:      Result Value   WBC 3.4 (*)    RBC 3.59 (*)    Hemoglobin 11.8 (*)    HCT 37.2 (*)    MCV 103.6 (*)    Platelets 105 (*)    nRBC 5.1 (*)    Lymphs Abs 0.1 (*)    nRBC 6 (*)    All other components  within normal limits  COMPREHENSIVE METABOLIC PANEL - Abnormal; Notable for the following components:   CO2 19 (*)    Glucose, Bld 1,047 (*)    BUN 47 (*)    Creatinine, Ser 1.33 (*)    Calcium 7.9 (*)    Total Protein 5.5 (*)    Albumin 3.3 (*)    AST 120 (*)    ALT 155 (*)    Alkaline Phosphatase 134 (*)    GFR calc non Af Amer 58 (*)    All other components within normal limits   URINALYSIS, ROUTINE W REFLEX MICROSCOPIC - Abnormal; Notable for the following components:   Glucose, UA >=500 (*)    All other components within normal limits  CBG MONITORING, ED - Abnormal; Notable for the following components:   Glucose-Capillary >600 (*)    All other components within normal limits  BLOOD GAS, VENOUS    EKG EKG Interpretation  Date/Time:  Thursday August 21 2018 17:59:19 EDT Ventricular Rate:  126 PR Interval:    QRS Duration: 90 QT Interval:  320 QTC Calculation: 464 R Axis:   56 Text Interpretation:  Sinus tachycardia Low voltage, precordial leads Abnormal R-wave progression, early transition No old tracing to compare Confirmed by Dorie Rank [phone]) on 08/21/2018 6:21:40 PM Also confirmed by Dorie Rank [phone]), editor [place], Jeannetta Nap [phone])  on 08/22/2018 9:59:48 AM   Radiology Dg Chest Port 1 View  Result Date: 08/21/2018 CLINICAL DATA:  Head neck cancer. Hyperglycemia. EXAM: PORTABLE CHEST 1 VIEW COMPARISON:  None. FINDINGS: The heart size and mediastinal contours are within normal limits. Both lungs are clear. The visualized skeletal structures are unremarkable. Port-A-Cath tip proximal RIGHT atrium. IMPRESSION: No active disease. Electronically Signed   By: Staci Righter M.D.   On: 08/21/2018 18:42    Procedures .Critical Care Performed by: Dorie Rank, MD Authorized by: Dorie Rank, MD   Critical care provider statement:    Critical care time (minutes):  45   Critical care was time spent personally by me on the following activities:  Discussions with consultants, evaluation of patient's response to treatment, examination of patient, ordering and performing treatments and interventions, ordering and review of laboratory studies, ordering and review of radiographic studies, pulse oximetry, re-evaluation of patient's condition, obtaining history from patient or surrogate and review of old charts   (including critical care time)  Medications Ordered in ED  Medications  sodium chloride 0.9 % bolus 1,000 mL (1,000 mLs Intravenous New Bag/Given 08/21/18 1826)    Followed by  0.9 %  sodium chloride infusion (1,000 mLs Intravenous New Bag/Given 08/21/18 1931)  insulin regular, human (MYXREDLIN) 100 units/ 100 mL infusion (has no administration in time range)  potassium chloride SA (K-DUR,KLOR-CON) CR tablet 40 mEq (has no administration in time range)  sodium chloride 0.9 % bolus 1,000 mL (1,000 mLs Intravenous New Bag/Given 08/21/18 2023)     Initial Impression / Assessment and Plan / ED Course  I have reviewed the triage vital signs and the nursing notes.  Pertinent labs & imaging results that were available during my care of the patient were reviewed by me and considered in my medical decision making (see chart for details).  Clinical Course as of Aug 21 2039  Thu Aug 21, 2018  2000 Preliminary labs show severe hyperglycemia on his CBG.  Electrolyte panel still pending.   [JK]  2000 BC reviewed and is stable compared to previous values.   [JK]  2000 Continue with fluids most likely  start the patient on insulin drip once I see his electrolyte panel.   [JK]  2029 Patient's laboratory tests are notable for extreme hyperglycemia.  Mild drop in his bicarb but no significant anion gap.  LFTs are also elevated.  We will continue with IV fluid hydration.  Insulin drip ordered.   [JK]    Clinical Course User Index [JK] Dorie Rank, MD      Patient presented to the emergency room for evaluation of hyperglycemia associated with a syncopal episode.  Patient recently started difficulties with hyperglycemia.  Unclear what is causing this extreme hyperglycemia.  He does not have a history of diabetes.  He is not any current steroid regimen.  Syncope likely related to his extreme hyperglycemia and dehydration. Patient also has elevation in LFTs.  Might consider imaging for further evaluation considering his malignancy.  Patient was started IV fluids.  IV  insulin infusion was started.  Plan on admission to the hospital for further treatment  Final Clinical Impressions(s) / ED Diagnoses   Final diagnoses:  Hyperglycemia  Dehydration  Syncope and collapse      Dorie Rank, MD 08/21/18 2041    Dorie Rank, MD 08/30/18 1027

## 2018-08-21 NOTE — ED Notes (Signed)
[facility] at bedside. All belongings packed and taken with pt to [facility]

## 2018-08-21 NOTE — Progress Notes (Signed)
Pt's shirt, wallet, coffee mug, and radiation mask given to wife

## 2018-08-21 NOTE — ED Notes (Signed)
Glenn Sidle, MD contacted to change bed placement to [facility] per orders from [facility] and charge RN

## 2018-08-21 NOTE — ED Notes (Signed)
Pt has fluids at bedside. No reports of emesis.

## 2018-08-21 NOTE — Progress Notes (Signed)
1650- Patient stood up to use the urinal. After sitting back down, patient became pale and unresponsive. Eyes fixed looking up and to the left. Dr. Maylon Peppers present. Rapid Response called. Non-re breather mask placed on patient and vital signs taken. Patient now responsive and  transported to ED by rapid response nurse, respiratory therapist, and this nurse. Report given to Walden Field, RN in the ED. Patient transported with his cell phone and eyeglasses. Watt Climes, RN aware patient has these items on him.

## 2018-08-21 NOTE — Patient Instructions (Signed)
Dehydration, Adult  Dehydration is when there is not enough fluid or water in your body. This happens when you lose more fluids than you take in. Dehydration can range from mild to very bad. It should be treated right away to keep it from getting very bad. Symptoms of mild dehydration may include:  Thirst.  Dry lips.  Slightly dry mouth.  Dry, warm skin.  Dizziness. Symptoms of moderate dehydration may include:  Very dry mouth.  Muscle cramps.  Dark pee (urine). Pee may be the color of tea.  Your body making less pee.  Your eyes making fewer tears.  Heartbeat that is uneven or faster than normal (palpitations).  Headache.  Light-headedness, especially when you stand up from sitting.  Fainting (syncope). Symptoms of very bad dehydration may include:  Changes in skin, such as: ? Cold and clammy skin. ? Blotchy (mottled) or pale skin. ? Skin that does not quickly return to normal after being lightly pinched and let go (poor skin turgor).  Changes in body fluids, such as: ? Feeling very thirsty. ? Your eyes making fewer tears. ? Not sweating when body temperature is high, such as in hot weather. ? Your body making very little pee.  Changes in vital signs, such as: ? Weak pulse. ? Pulse that is more than 100 beats a minute when you are sitting still. ? Fast breathing. ? Low blood pressure.  Other changes, such as: ? Sunken eyes. ? Cold hands and feet. ? Confusion. ? Lack of energy (lethargy). ? Trouble waking up from sleep. ? Short-term weight loss. ? Unconsciousness. Follow these instructions at home:   If told by your doctor, drink an ORS: ? Make an ORS by using instructions on the package. ? Start by drinking small amounts, about  cup (120 mL) every 5-10 minutes. ? Slowly drink more until you have had the amount that your doctor said to have.  Drink enough clear fluid to keep your pee clear or pale yellow. If you were told to drink an ORS, finish the  ORS first, then start slowly drinking clear fluids. Drink fluids such as: ? Water. Do not drink only water by itself. Doing that can make the salt (sodium) level in your body get too low (hyponatremia). ? Ice chips. ? Fruit juice that you have added water to (diluted). ? Low-calorie sports drinks.  Avoid: ? Alcohol. ? Drinks that have a lot of sugar. These include high-calorie sports drinks, fruit juice that does not have water added, and soda. ? Caffeine. ? Foods that are greasy or have a lot of fat or sugar.  Take over-the-counter and prescription medicines only as told by your doctor.  Do not take salt tablets. Doing that can make the salt level in your body get too high (hypernatremia).  Eat foods that have minerals (electrolytes). Examples include bananas, oranges, potatoes, tomatoes, and spinach.  Keep all follow-up visits as told by your doctor. This is important. Contact a doctor if:  You have belly (abdominal) pain that: ? Gets worse. ? Stays in one area (localizes).  You have a rash.  You have a stiff neck.  You get angry or annoyed more easily than normal (irritability).  You are more sleepy than normal.  You have a harder time waking up than normal.  You feel: ? Weak. ? Dizzy. ? Very thirsty.  You have peed (urinated) only a small amount of very dark pee during 6-8 hours. Get help right away if:  You have   symptoms of very bad dehydration.  You cannot drink fluids without throwing up (vomiting).  Your symptoms get worse with treatment.  You have a fever.  You have a very bad headache.  You are throwing up or having watery poop (diarrhea) and it: ? Gets worse. ? Does not go away.  You have blood or something green (bile) in your throw-up.  You have blood in your poop (stool). This may cause poop to look black and tarry.  You have not peed in 6-8 hours.  You pass out (faint).  Your heart rate when you are sitting still is more than 100 beats a  minute.  You have trouble breathing. This information is not intended to replace advice given to you by your health care provider. Make sure you discuss any questions you have with your health care provider. Document Released: 03/03/2009 Document Revised: 11/25/2015 Document Reviewed: 07/01/2015 Elsevier Interactive Patient Education  2019 [employer]vier Inc.  

## 2018-08-21 NOTE — Progress Notes (Signed)
OK to infuse IV fluids over 1 hour per Dr. Maylon Peppers.

## 2018-08-21 NOTE — ED Notes (Signed)
Pt given urinal and made aware of need for urine specimen 

## 2018-08-21 NOTE — ED Notes (Signed)
Report given to [facility] 

## 2018-08-21 NOTE — ED Notes (Signed)
Bed: YF74 Expected date:  Expected time:  Means of arrival:  Comments: [facility] hyperglycemia syncope

## 2018-08-21 NOTE — ED Notes (Signed)
Resp called about VBG. Sent to ICU for them to run

## 2018-08-21 NOTE — ED Notes (Signed)
[facility] contacted. Paperwork printed.  

## 2018-08-21 NOTE — ED Triage Notes (Signed)
Pt from [facility] center with hx of head and neck CA.  Pt was seen in ED yesterday for hyperglycemia, and was D/C.  Pt went to [facility] today for radiation tx and fluids.  Pt CBG was high, oncologist gave 10 units insulin subq.  Pt vasovagaled on the toilet.  BP was 95/78 when rapid response arrived and O2 sats in the 70's.  BP is now 116/83.  Pt pale and diaphoretic.  Pt on non-rebreather satting at 100%

## 2018-08-21 NOTE — ED Notes (Signed)
Pt given cup of ice water 

## 2018-08-21 NOTE — Telephone Encounter (Signed)
Received VM message from pt's wife asking to be called about her husband. TCT Brenda. She states Merry Proud was discharged from the ER last around midnight after being treated for dehydration and hyperglycemia. His discharge blood sugar was 373. He was instructed to increase his water intake and to start Metformin 500 mg BID, and to follow up with his pcp in a week. Nevin Bloodgood states he was not feeling well. She found him sitting on the floor. She checked his VS (she has a BP cuff and an oximeter at home-she is a nurse)  She states his heart rate was up and BP 108/50 and S02 intially in the 70's but increased to the 80's with deep breathing. She states he was very weak and pale. He could only get up to a chair with help. She called EMS.  EMT's checked his blood sugar and found it to be 517.  He was given more water orally and through his PEG tube and seemed to feel better. He was not transported to ED Wife is calling about what to do next.  Merry Proud is scheduled for his last radiation treatment today @ 3:45 pm. Call made to Dr. Maylon Peppers. He ordered IVF's for patient after his radiation treatment and to check his blood sugar once he gets to infusion room . Fluid orders entered per Dr. Maylon Peppers.  POC glucose ordered as well.  Spoke with charge nurse regarding pt getting IV fluids today, as well as Friday, Saturday and the following Monday and Tuesday.  High priority scheduling message sent for these appts  Returned call to wife and advised of the above. She voiced understanding. RadOnc notified to bring pt to infusion room after treatment.

## 2018-08-21 NOTE — Progress Notes (Deleted)
[place] OFFICE PROGRESS NOTE  Patient Care Team: Glenn Arabian, MD as PCP - General (Family Medicine) Glenn Montane, MD as Consulting Physician (Otolaryngology) Glenn Gibson, MD as Attending Physician (Radiation Oncology) Glenn Men, MD as Consulting Physician (Hematology) Glenn Sauers, RN as Oncology Nurse Navigator Glenn Goodman, Glenn Goodman, Glenn Goodman as Speech Language Pathologist (Speech Pathology) Glenn Goodman, RD as Dietitian (Nutrition) Glenn Goodman, Glenn Goodman, Glenn Goodman as Physical Therapist (Physical Therapy) Glenn [facility], Glenn Goodman as Social Worker Glenn Ames, MD as Referring Physician (Otolaryngology)  HEME/ONC OVERVIEW: 1. Stage I (pT1pN1M0) left tonsil SCCa, p16+ -02/2018: CT neck showed enlarged left Level II and II LN with central necrosis (largest 2.4cm); effacement of the left IJ vein noted  -03/2018: US-guided bx of left cervical LN; path showed SCCa, p16+ -04/2018: PET showed FDG-avid left palatine tonsil lesion measuring ~1.3cm with associated FDG-avid left Level IIa LN's; no evidence of metastatic disease -05/2018: TORS by Dr. 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 Port and PEG placement in 06/2018   TREATMENT SUMMARY:  05/26/2018: TORS by Dr. Nicolette Bang at [facility]   07/07/2018 - 08/21/2018: adjuvant chemoradiation with weekly cisplatin 44m/m2 (due to moderate hearing loss)  ASSESSMENT & PLAN:   Severe hyperglycemia -Patient's glucose was over 800 yesterday in clinic, and he was sent to the ER for further management; he received 3L NS and one dose of metformin, and his glucose was 373 at the time of discharge from the ER -Unfortunately, his repeat glucose today in the infusion clinic was over 600 -I discussed the case at length with pharmacy; as  we are unable to administer IV insulin in infusion, we administered SubQ regular insulin 10 units x 1  -I have prescribed Lantus 10 units daily and glucometer, and gave the patient instruction to check his blood glucose at least 2-3 times per day -I emphasized the importance of following up with his PCP for further management of his hyperglycemia -Nutrition consulted to provide recommendations on enteral feeding   Vasovagal syncope -As the patient was getting ready to leave the infusion area, he became pale and minimally responsive for 20-30 seconds, likely vasovagal syncope -Exam was negative for any focal neurologic deficits -In the setting of severe persistent hyperglycemia, this is likely caused by severe volume depletion -Rapid response team was initiated  -I discussed the case in detail with the ER physician, and will direct the patient to the ER for further management -I also updated the patient's wife regarding his clinical decline  No orders of the defined types were placed in this encounter.  All questions were answered. The patient knows to call the clinic with any problems, questions or concerns. No barriers to learning was detected.  A total of more than 40 minutes were spent face-to-face with the patient during this encounter and over half of that time was spent on counseling and coordination of care as outlined above.   YTish Men MD 08/21/2018 5:29 PM  CHIEF COMPLAINT: "I am just really tired"  INTERVAL HISTORY: [name] added on in infusion clinic due to dehydration.  Patient was seen in the oncology clinic yesterday, and was found with severe hyperglycemia (glucose 850).  Patient was taken to the ER for further management, where he received 3 L of normal saline and metformin x1, and was  discharged from the ER with instruction to follow-up with his PCP.  He presented for his final treatment to radiation today, but felt very fatigued and called the oncology clinic.  He  was added on in the infusion schedule, and repeat POC glucose was greater than 600.  Due to the inability to administer IV regular insulin in the infusion clinic, he was given SubQ regular insulin 10 units x 1.  He was also given 1 L normal saline.  As the patient was getting ready to leave, he stood up to button his shirt and became pale.  He sat back in his chair, and became minimally responsive for approximately 20 to 30 seconds.  There was no tonic-clonic movement in extremities.  After a brief episode of decreased responsiveness, his mental status improved and he began to respond to questions again.  On exam, he did not have any focal neurologic deficits.  Rapid response team was called, and patient was found with mild oxygen desaturation, and was started on nonrebreather.  He was taken to the ER for further evaluation.  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 EBV ISH negative.     04/22/2018 Pathology Results    (Accession: [id number]) Tonsil, biopsy, left, base of tongue - INVASIVE KERATINIZING SQUAMOUS CELL CARCINOMA.    04/29/2018 Imaging    PET:  IMPRESSION: 1. Left palatine tonsil/adjacent pharyngeal mucosal space lesion is asymmetric, measures about 1.3 cm in long axis, and has a maximum SUV of 8.2, compatible with malignancy. There is associated hypermetabolic left level IIa adenopathy as shown on prior exams. 2. Symmetric accentuated activity in the lymphoid tissue at the tongue base, maximum SUV 5.3, quite likely physiologic given the symmetry.  3. No  findings of metastatic disease Pred to the chest, abdomen/pelvis, or skeleton.    05/26/2018 Surgery    Radical tonsillectomy    05/26/2018 Pathology Results    PROCEDURE: Radical tonsillectomy TUMOR SITE: Glossotonsillar sulcus TUMOR LATERALITY: LEFT TUMOR FOCALITY: Unifocal TUMOR SIZE: GREATEST DIMENSION: 1.6 cm HISTOLOGIC TYPE: HPV-mediated squamous cell carcinoma MARGINS: Involved by invasive tumor (focal) Specify location of closest margin: Tongue base (soft tissue) Note: Although an additional tongue base mucosal margin was submitted, location of tumor extension to margin appears 1 cm from the mucosal surface. LYMPHOVASCULAR INVASION: Not identified PERINEURAL INVASION: Present REGIONAL LYMPH NODES:  NUMBER OF LYMPH NODES INVOLVED: 1  NUMBER OF LYMPH NODES EXAMINED: 6  LATERALITY OF LYMPH NODES INVOLVED: Ipsilateral  SIZE OF LARGEST METASTATIC DEPOSIT: 6.8 cm  EXTRANODAL EXTENSION: Present   Distance from lymph node capsule: 6 millimeters PATHOLOGIC STAGE CLASSIFICATION (pTNM, AJCC 8TH Ed): pT1 pN1 (HPV-mediated oropharynx tumor)  pT1: Tumor 2 cm or smaller in greatest dimension  pN1: Metastasis in 4 or fewer lymph nodes ANCILLARY STUDIES:  p16 expression by immunohistochemistry: Positive  HPV by PCR: pending    05/26/2018 Cancer Staging    Staging form: Pharynx - HPV-Mediated Oropharynx, AJCC 8th Edition - Pathologic stage from 05/26/2018: Stage I (pT1, pN1, cM0, p16+) - Signed by Glenn Men, MD on  06/18/2018    07/10/2018 - 07/10/2018 Chemotherapy    The patient had palonosetron (ALOXI) injection 0.25 mg, 0.25 mg, Intravenous,  Once, 0 of 3 cycles CISplatin (PLATINOL) 198 mg in sodium chloride 0.9 % 500 mL chemo infusion, 100 mg/m2 = 198 mg, Intravenous,  Once, 0 of 3 cycles fosaprepitant (EMEND) 150 mg, dexamethasone (DECADRON) 12 mg in sodium chloride 0.9 % 145 mL IVPB, , Intravenous,  Once, 0 of 3 cycles  for chemotherapy treatment.      07/10/2018 -  Chemotherapy    The patient had palonosetron (ALOXI) injection 0.25 mg, 0.25 mg, Intravenous,  Once, 6 of 6 cycles Administration: 0.25 mg (07/10/2018), 0.25 mg (07/18/2018), 0.25 mg (07/24/2018), 0.25 mg (07/31/2018), 0.25 mg (08/07/2018), 0.25 mg (08/14/2018) CISplatin (PLATINOL) 79 mg in sodium chloride 0.9 % 250 mL chemo infusion, 40 mg/m2 = 79 mg, Intravenous,  Once, 6 of 6 cycles Administration: 79 mg (07/10/2018), 79 mg (07/18/2018), 79 mg (07/24/2018), 79 mg (07/31/2018), 79 mg (08/07/2018), 79 mg (08/14/2018) fosaprepitant (EMEND) 150 mg, dexamethasone (DECADRON) 12 mg in sodium chloride 0.9 % 145 mL IVPB, , Intravenous,  Once, 6 of 6 cycles Administration:  (07/10/2018),  (07/18/2018),  (07/24/2018),  (07/31/2018),  (08/07/2018),  (08/14/2018)  for chemotherapy treatment.      Malignant neoplasm of base of tongue ([place])   05/09/2018 Initial Diagnosis    Malignant neoplasm of base of tongue ([place])    05/09/2018 Cancer Staging    Staging form: Pharynx - HPV-Mediated Oropharynx, AJCC 8th Edition - Clinical: Stage I (cT1, cN1, cM0, p16+) - Signed by Glenn [facility], MD on 05/09/2018    06/11/2018 Cancer Staging    Staging form: Pharynx - HPV-Mediated Oropharynx, AJCC 8th Edition - Pathologic: Stage I (pT1, pN1, cM0, p16+) - Signed by Glenn [facility], MD on 06/11/2018     REVIEW OF SYSTEMS:   Constitutional: ( - ) fevers, ( - )  chills , ( - ) night sweats Eyes: ( - ) blurriness of vision, ( - ) double vision, ( - ) watery eyes Ears, nose, mouth, throat, and face: ( - ) mucositis, ( + ) sore throat Respiratory: ( - ) cough, ( - ) dyspnea, ( - ) wheezes Cardiovascular: ( - ) palpitation, ( - ) chest discomfort, ( - ) lower extremity swelling Gastrointestinal:  ( - ) nausea, ( - ) heartburn, ( - ) change in bowel habits Skin: ( - ) abnormal skin rashes Lymphatics: ( - ) new lymphadenopathy, ( - ) easy bruising Neurological: ( - ) numbness, ( - ) tingling Behavioral/Psych: ( - ) mood change, ( -  ) new changes  All other systems were reviewed with the patient and are negative.  I have reviewed the past medical history, past surgical history, social history and family history with the patient and they are unchanged from previous note.  ALLERGIES:  has No Known Allergies.  MEDICATIONS:  Current Outpatient Medications  Medication Sig Dispense Refill  . blood glucose meter kit and supplies KIT Dispense based on patient and insurance preference. Use up to four times daily as directed. (FOR ICD-9 250.00, 250.01).   Monitor 2-3 times per day. 1 each 0  . clotrimazole (MYCELEX) 10 MG troche Take 1 lozenge (10 mg total) by mouth 5 (five) times daily for 30 days. 150 lozenge 1  . dexamethasone (DECADRON) 4 MG tablet Take 2 tablets by mouth once a day on the day after chemotherapy and then take 2 tablets two times a day for  2 days. Take with food. 30 tablet 1  . docusate sodium (COLACE) 100 MG capsule Take 100 mg by mouth 2 (two) times daily as needed for mild constipation.    . fluconazole (DIFLUCAN) 100 MG tablet Take 1 tablet (100 mg total) by mouth daily for 7 days. (Patient not taking: Reported on 08/20/2018) 7 tablet 0  . insulin glargine (LANTUS) 100 unit/mL SOPN Inject 0.1 mLs (10 Units total) into the skin daily for 30 days. 15 mL 5  . lidocaine (XYLOCAINE) 2 % solution Patient: Mix 1part 2% viscous lidocaine, 1part H20. Swish & swallow 49m of diluted mixture, 348m before meals and at bedtime, up to QID 100 mL 5  . lidocaine-prilocaine (EMLA) cream Apply to affected area once 30 g 3  . LORazepam (ATIVAN) 0.5 MG tablet Take 1 tablet (0.5 mg total) by mouth every 6 (six) hours as needed (Nausea or vomiting). 30 tablet 0  . metFORMIN (GLUCOPHAGE) 500 MG tablet Take 1 tablet (500 mg total) by mouth 2 (two) times daily with a meal. 60 tablet 0  . morphine (MSIR) 15 MG tablet Take 1 tablet (15 mg total) by mouth every 4 (four) hours as needed for severe pain. (Patient taking differently: Take  15 mg by mouth at bedtime as needed for severe pain. ) 60 tablet 0  . Neomycin-Bacitracin-Polymyxin (NEOSPORIN EX) Apply 1 application topically daily as needed (neck wound).    . Nutritional Supplements (FEEDING SUPPLEMENT, OSMOLITE 1.5 CAL,) LIQD Give 1.5 bottles Osmolite 1.5 Bid via PEG and 2 bottles Osmolite 1.5 via PEG for total of 7 bottles daily.  Give 60 cc free water before and after bolus feedings 4 times daily.  In addition consume or give via PEG 240 mL free water 3 times daily.  Please send formula and split gauze to patient.  He does not need other supplies at this time. 7 Bottle 0  . Nutritional Supplements (PROMOD) LIQD Add 30 mL Promod TID. Continue 7 bottles Osmolite 1.5 daily. 2 Bottle 1  . ondansetron (ZOFRAN) 8 MG tablet Take 1 tablet (8 mg total) by mouth 2 (two) times daily as needed. Start on the third day after chemotherapy. 30 tablet 1  . phenazopyridine (PYRIDIUM) 200 MG tablet Take 1 tablet (200 mg total) by mouth 3 (three) times daily as needed for pain. (Patient not taking: Reported on 07/23/2018) 10 tablet 0  . pramipexole (MIRAPEX) 1 MG tablet Take 1 mg by mouth at bedtime.    . prochlorperazine (COMPAZINE) 10 MG tablet Take 1 tablet (10 mg total) by mouth every 6 (six) hours as needed (Nausea or vomiting). (Patient not taking: Reported on 08/20/2018) 30 tablet 1  . sodium fluoride (PREVIDENT 5000 PLUS) 1.1 % CREA dental cream Apply to tooth brush. Brush teeth for 2 minutes. Spit out excess. DO NOT rinse afterwards. Repeat nightly. 1 Tube prn  . Wound Dressings (SONAFINE EX) Apply 1 application topically daily as needed (neck wound).     No current facility-administered medications for this visit.     PHYSICAL EXAMINATION: ECOG PERFORMANCE STATUS: 2 - Symptomatic, <50% confined to bed  Wt Readings from Last 3 Encounters:  08/20/18 165 lb (74.8 kg)  08/20/18 161 lb 1.6 oz (73.1 kg)  08/13/18 173 lb 11.2 oz (78.8 kg)   Temp Readings from Last 3 Encounters:  08/20/18  97.6 F (36.4 C) (Oral)  08/20/18 97.8 F (36.6 C) (Oral)   BP Readings from Last 3 Encounters:  08/21/18 102/70  08/20/18 138/90  08/20/18 (!Marland Kitchen  155/102   Pulse Readings from Last 3 Encounters:  08/21/18 (!) 139  08/20/18 91  08/20/18 (!) 111   GENERAL: pale, no acute distress, frail appearing  SKIN: left neck skin peeling from radiation  EYES: conjunctiva are pink and non-injected, sclera clear OROPHARYNX: dry oral mucosa   LYMPH:  no palpable lymphadenopathy in the cervical LUNGS: shallow breathing, slightly labored  HEART: tachycardic, regular rhythm ABDOMEN: soft, non-distended  PSYCH: awake and responsive, slowed response  NEURO: CN II-XII intact, normal strength in upper and lower extremities   LABORATORY DATA:  I have reviewed the data as listed    Component Value Date/Time   NA 135 08/20/2018 1612   K 4.7 08/20/2018 1612   CL 98 08/20/2018 1408   CO2 25 08/20/2018 1408   GLUCOSE 850 (HH) 08/20/2018 1408   BUN 42 (H) 08/20/2018 1408   CREATININE 0.90 08/20/2018 1612   CREATININE 1.60 (H) 08/20/2018 1408   CALCIUM 9.5 08/20/2018 1408   PROT 6.8 08/20/2018 1603   ALBUMIN 3.9 08/20/2018 1603   AST 18 08/20/2018 1603   AST 17 06/18/2018 1105   ALT 41 08/20/2018 1603   ALT 38 06/18/2018 1105   ALKPHOS 131 (H) 08/20/2018 1603   BILITOT 0.4 08/20/2018 1603   BILITOT 0.4 06/18/2018 1105   GFRNONAA 46 (L) 08/20/2018 1408   GFRAA 54 (L) 08/20/2018 1408    No results found for: SPEP, UPEP  Lab Results  Component Value Date   WBC 2.1 (L) 08/20/2018   NEUTROABS 1.7 08/20/2018   HGB 12.9 (L) 08/20/2018   HCT 38.0 (L) 08/20/2018   MCV 98.7 08/20/2018   PLT 126 (L) 08/20/2018      Chemistry      Component Value Date/Time   NA 135 08/20/2018 1612   K 4.7 08/20/2018 1612   CL 98 08/20/2018 1408   CO2 25 08/20/2018 1408   BUN 42 (H) 08/20/2018 1408   CREATININE 0.90 08/20/2018 1612   CREATININE 1.60 (H) 08/20/2018 1408      Component Value Date/Time    CALCIUM 9.5 08/20/2018 1408   ALKPHOS 131 (H) 08/20/2018 1603   AST 18 08/20/2018 1603   AST 17 06/18/2018 1105   ALT 41 08/20/2018 1603   ALT 38 06/18/2018 1105   BILITOT 0.4 08/20/2018 1603   BILITOT 0.4 06/18/2018 1105

## 2018-08-22 ENCOUNTER — Inpatient Hospital Stay (HOSPITAL_COMMUNITY): Payer: Commercial Managed Care - PPO

## 2018-08-22 ENCOUNTER — Ambulatory Visit: Payer: Self-pay

## 2018-08-22 DIAGNOSIS — D6959 Other secondary thrombocytopenia: Secondary | ICD-10-CM

## 2018-08-22 DIAGNOSIS — I371 Nonrheumatic pulmonary valve insufficiency: Secondary | ICD-10-CM

## 2018-08-22 DIAGNOSIS — D6481 Anemia due to antineoplastic chemotherapy: Secondary | ICD-10-CM

## 2018-08-22 DIAGNOSIS — D701 Agranulocytosis secondary to cancer chemotherapy: Secondary | ICD-10-CM

## 2018-08-22 DIAGNOSIS — N179 Acute kidney failure, unspecified: Secondary | ICD-10-CM

## 2018-08-22 DIAGNOSIS — R945 Abnormal results of liver function studies: Secondary | ICD-10-CM

## 2018-08-22 DIAGNOSIS — I2699 Other pulmonary embolism without acute cor pulmonale: Secondary | ICD-10-CM

## 2018-08-22 DIAGNOSIS — D61818 Other pancytopenia: Secondary | ICD-10-CM

## 2018-08-22 DIAGNOSIS — R739 Hyperglycemia, unspecified: Secondary | ICD-10-CM

## 2018-08-22 DIAGNOSIS — T451X5A Adverse effect of antineoplastic and immunosuppressive drugs, initial encounter: Secondary | ICD-10-CM

## 2018-08-22 DIAGNOSIS — E876 Hypokalemia: Secondary | ICD-10-CM

## 2018-08-22 DIAGNOSIS — C099 Malignant neoplasm of tonsil, unspecified: Secondary | ICD-10-CM

## 2018-08-22 DIAGNOSIS — K1230 Oral mucositis (ulcerative), unspecified: Secondary | ICD-10-CM

## 2018-08-22 DIAGNOSIS — E860 Dehydration: Secondary | ICD-10-CM

## 2018-08-22 DIAGNOSIS — R7989 Other specified abnormal findings of blood chemistry: Secondary | ICD-10-CM

## 2018-08-22 HISTORY — PX: IR ANGIOGRAM PULMONARY BILATERAL SELECTIVE: IMG664

## 2018-08-22 HISTORY — PX: IR ANGIOGRAM SELECTIVE EACH ADDITIONAL VESSEL: IMG667

## 2018-08-22 HISTORY — PX: IR INFUSION THROMBOL ARTERIAL INITIAL (MS): IMG5376

## 2018-08-22 HISTORY — PX: IR US GUIDE VASC ACCESS RIGHT: IMG2390

## 2018-08-22 LAB — GLUCOSE, CAPILLARY
Glucose-Capillary: 112 mg/dL — ABNORMAL HIGH (ref 70–99)
Glucose-Capillary: 119 mg/dL — ABNORMAL HIGH (ref 70–99)
Glucose-Capillary: 120 mg/dL — ABNORMAL HIGH (ref 70–99)
Glucose-Capillary: 125 mg/dL — ABNORMAL HIGH (ref 70–99)
Glucose-Capillary: 145 mg/dL — ABNORMAL HIGH (ref 70–99)
Glucose-Capillary: 152 mg/dL — ABNORMAL HIGH (ref 70–99)
Glucose-Capillary: 159 mg/dL — ABNORMAL HIGH (ref 70–99)
Glucose-Capillary: 167 mg/dL — ABNORMAL HIGH (ref 70–99)
Glucose-Capillary: 231 mg/dL — ABNORMAL HIGH (ref 70–99)
Glucose-Capillary: 276 mg/dL — ABNORMAL HIGH (ref 70–99)
Glucose-Capillary: 363 mg/dL — ABNORMAL HIGH (ref 70–99)

## 2018-08-22 LAB — CBC
HCT: 33.1 % — ABNORMAL LOW (ref 39.0–52.0)
HCT: 30.2 % — ABNORMAL LOW (ref 39.0–52.0)
HCT: 29.9 % — ABNORMAL LOW (ref 39.0–52.0)
Hemoglobin: 10.7 g/dL — ABNORMAL LOW (ref 13.0–17.0)
Hemoglobin: 9.9 g/dL — ABNORMAL LOW (ref 13.0–17.0)
Hemoglobin: 9.7 g/dL — ABNORMAL LOW (ref 13.0–17.0)
MCH: 31.3 pg (ref 26.0–34.0)
MCH: 31.2 pg (ref 26.0–34.0)
MCH: 31.7 pg (ref 26.0–34.0)
MCHC: 32.3 g/dL (ref 30.0–36.0)
MCHC: 32.8 g/dL (ref 30.0–36.0)
MCHC: 32.4 g/dL (ref 30.0–36.0)
MCV: 96.8 fL (ref 80.0–100.0)
MCV: 95.3 fL (ref 80.0–100.0)
MCV: 97.7 fL (ref 80.0–100.0)
Platelets: 90 10*3/uL — ABNORMAL LOW (ref 150–400)
Platelets: 78 10*3/uL — ABNORMAL LOW (ref 150–400)
Platelets: 74 10*3/uL — ABNORMAL LOW (ref 150–400)
RBC: 3.42 MIL/uL — ABNORMAL LOW (ref 4.22–5.81)
RBC: 3.17 MIL/uL — ABNORMAL LOW (ref 4.22–5.81)
RBC: 3.06 MIL/uL — ABNORMAL LOW (ref 4.22–5.81)
RDW: 15.7 % — ABNORMAL HIGH (ref 11.5–15.5)
RDW: 15.8 % — ABNORMAL HIGH (ref 11.5–15.5)
RDW: 15.9 % — ABNORMAL HIGH (ref 11.5–15.5)
WBC: 2.5 10*3/uL — ABNORMAL LOW (ref 4.0–10.5)
WBC: 2 10*3/uL — ABNORMAL LOW (ref 4.0–10.5)
WBC: 1.8 10*3/uL — ABNORMAL LOW (ref 4.0–10.5)
nRBC: 3.9 % — ABNORMAL HIGH (ref 0.0–0.2)
nRBC: 3.5 % — ABNORMAL HIGH (ref 0.0–0.2)
nRBC: 3.8 % — ABNORMAL HIGH (ref 0.0–0.2)

## 2018-08-22 LAB — COMPREHENSIVE METABOLIC PANEL
ALT: 203 U/L — ABNORMAL HIGH (ref 0–44)
AST: 116 U/L — ABNORMAL HIGH (ref 15–41)
Albumin: 3 g/dL — ABNORMAL LOW (ref 3.5–5.0)
Alkaline Phosphatase: 94 U/L (ref 38–126)
Anion gap: 7 (ref 5–15)
BUN: 31 mg/dL — ABNORMAL HIGH (ref 6–20)
CO2: 23 mmol/L (ref 22–32)
Calcium: 8 mg/dL — ABNORMAL LOW (ref 8.9–10.3)
Chloride: 117 mmol/L — ABNORMAL HIGH (ref 98–111)
Creatinine, Ser: 0.86 mg/dL (ref 0.61–1.24)
GFR calc Af Amer: >60 mL/min (ref >60)
GFR calc non Af Amer: >60 mL/min (ref >60)
Glucose, Bld: 130 mg/dL — ABNORMAL HIGH (ref 70–99)
Potassium: 3.3 mmol/L — ABNORMAL LOW (ref 3.5–5.1)
Sodium: 147 mmol/L — ABNORMAL HIGH (ref 135–145)
Total Bilirubin: 0.5 mg/dL (ref 0.3–1.2)
Total Protein: 5.1 g/dL — ABNORMAL LOW (ref 6.5–8.1)

## 2018-08-22 LAB — HEPARIN LEVEL (UNFRACTIONATED)
Heparin Unfractionated: 0.25 IU/mL — ABNORMAL LOW (ref 0.30–0.70)
Heparin Unfractionated: 0.9 IU/mL — ABNORMAL HIGH (ref 0.30–0.70)
Heparin Unfractionated: 0.88 IU/mL — ABNORMAL HIGH (ref 0.30–0.70)

## 2018-08-22 LAB — TSH: TSH: 0.206 u[IU]/mL — ABNORMAL LOW (ref 0.350–4.500)

## 2018-08-22 LAB — TROPONIN I
Troponin I: 0.56 ng/mL (ref <0.03)
Troponin I: 0.7 ng/mL (ref <0.03)

## 2018-08-22 LAB — FIBRINOGEN
Fibrinogen: 248 mg/dL (ref 210–475)
Fibrinogen: 236 mg/dL (ref 210–475)

## 2018-08-22 LAB — BRAIN NATRIURETIC PEPTIDE: B Natriuretic Peptide: 457.1 pg/mL — ABNORMAL HIGH (ref 0.0–100.0)

## 2018-08-22 LAB — ECHOCARDIOGRAM COMPLETE
Height: 68 in
Weight: 2729.6 oz

## 2018-08-22 LAB — HIV ANTIBODY (ROUTINE TESTING W REFLEX): HIV Screen 4th Generation wRfx: NONREACTIVE

## 2018-08-22 LAB — LACTIC ACID, PLASMA: Lactic Acid, Venous: 1.1 mmol/L (ref 0.5–1.9)

## 2018-08-22 MED ORDER — SODIUM CHLORIDE 0.9 % IV SOLN
INTRAVENOUS | Status: DC
  Administered 2018-08-22 (×2): via INTRAVENOUS

## 2018-08-22 MED ORDER — LIVING WELL WITH DIABETES BOOK
Freq: Once | Status: AC
  Administered 2018-08-22: 16:00:00

## 2018-08-22 MED ORDER — SODIUM CHLORIDE 0.9 % IV SOLN: INTRAVENOUS | Status: DC

## 2018-08-22 MED ORDER — DOCUSATE SODIUM 50 MG/5ML PO LIQD
100.0000 mg | Freq: Two times a day (BID) | ORAL | Status: DC
  Administered 2018-08-23 – 2018-08-25 (×2): 100 mg
  Filled 2018-08-22 (×5): qty 10

## 2018-08-22 MED ORDER — SODIUM CHLORIDE 0.9 % IV SOLN
12.0000 mg | Freq: Once | INTRAVENOUS | Status: AC
  Administered 2018-08-22: 12 mg via INTRAVENOUS
  Filled 2018-08-22: qty 12

## 2018-08-22 MED ORDER — FENTANYL CITRATE (PF) 100 MCG/2ML IJ SOLN
INTRAMUSCULAR | Status: AC
  Filled 2018-08-22: qty 2

## 2018-08-22 MED ORDER — DEXTROSE 50 % IV SOLN: 25.0000 mL | INTRAVENOUS | Status: DC | PRN

## 2018-08-22 MED ORDER — HEPARIN (PORCINE) 25000 UT/250ML-% IV SOLN
1300.0000 [IU]/h | INTRAVENOUS | Status: AC
  Administered 2018-08-22: 1300 [IU]/h via INTRAVENOUS
  Administered 2018-08-24: 1100 [IU]/h via INTRAVENOUS
  Administered 2018-08-25: 1200 [IU]/h via INTRAVENOUS
  Administered 2018-08-26: 05:00:00 1300 [IU]/h via INTRAVENOUS
  Filled 2018-08-22 (×6): qty 250

## 2018-08-22 MED ORDER — SODIUM CHLORIDE 0.9% FLUSH
10.0000 mL | INTRAVENOUS | Status: DC | PRN
  Administered 2018-08-27: 10 mL
  Filled 2018-08-22: qty 40

## 2018-08-22 MED ORDER — ONDANSETRON HCL 4 MG/2ML IJ SOLN: 4.0000 mg | Freq: Four times a day (QID) | INTRAMUSCULAR | Status: DC | PRN

## 2018-08-22 MED ORDER — SODIUM CHLORIDE 0.9 % IV SOLN
12.0000 mg | Freq: Once | INTRAVENOUS | Status: DC
  Filled 2018-08-22: qty 12

## 2018-08-22 MED ORDER — PRO-STAT SUGAR FREE PO LIQD
30.0000 mL | Freq: Three times a day (TID) | ORAL | Status: DC
  Administered 2018-08-22 – 2018-08-23 (×2): 30 mL via ORAL
  Filled 2018-08-22 (×3): qty 30

## 2018-08-22 MED ORDER — SODIUM CHLORIDE 0.9% FLUSH
3.0000 mL | Freq: Two times a day (BID) | INTRAVENOUS | Status: DC
  Administered 2018-08-23 – 2018-08-27 (×3): 3 mL via INTRAVENOUS

## 2018-08-22 MED ORDER — ORAL CARE MOUTH RINSE
15.0000 mL | Freq: Two times a day (BID) | OROMUCOSAL | Status: DC
  Administered 2018-08-22 – 2018-08-25 (×5): 15 mL via OROMUCOSAL

## 2018-08-22 MED ORDER — SODIUM CHLORIDE 0.9 % IV SOLN
INTRAVENOUS | Status: DC
  Administered 2018-08-23: 09:00:00 via INTRAVENOUS

## 2018-08-22 MED ORDER — SODIUM CHLORIDE 0.9 % IV SOLN
INTRAVENOUS | Status: DC
  Administered 2018-08-22: 15:00:00 via INTRAVENOUS

## 2018-08-22 MED ORDER — LIDOCAINE HCL 1 % IJ SOLN
INTRAMUSCULAR | Status: AC
  Filled 2018-08-22: qty 20

## 2018-08-22 MED ORDER — INSULIN STARTER KIT- PEN NEEDLES (ENGLISH): 1.0000 | Freq: Once | Status: DC

## 2018-08-22 MED ORDER — INSULIN ASPART 100 UNIT/ML ~~LOC~~ SOLN
0.0000 [IU] | SUBCUTANEOUS | Status: DC
  Administered 2018-08-22 (×2): 4 [IU] via SUBCUTANEOUS

## 2018-08-22 MED ORDER — FENTANYL CITRATE (PF) 100 MCG/2ML IJ SOLN
INTRAMUSCULAR | Status: AC | PRN
  Administered 2018-08-22: 50 ug via INTRAVENOUS

## 2018-08-22 MED ORDER — INSULIN REGULAR BOLUS VIA INFUSION
0.0000 [IU] | Freq: Three times a day (TID) | INTRAVENOUS | Status: DC
  Filled 2018-08-22: qty 10

## 2018-08-22 MED ORDER — SODIUM CHLORIDE 0.9 % IV SOLN: 250.0000 mL | INTRAVENOUS | Status: DC | PRN

## 2018-08-22 MED ORDER — POTASSIUM CHLORIDE CRYS ER 20 MEQ PO TBCR
40.0000 meq | EXTENDED_RELEASE_TABLET | Freq: Once | ORAL | Status: AC
  Administered 2018-08-22: 15:00:00 40 meq via ORAL
  Filled 2018-08-22: qty 2

## 2018-08-22 MED ORDER — INSULIN ASPART 100 UNIT/ML ~~LOC~~ SOLN
0.0000 [IU] | SUBCUTANEOUS | Status: DC
  Administered 2018-08-22 – 2018-08-23 (×2): 1 [IU] via SUBCUTANEOUS
  Administered 2018-08-23: 13:00:00 3 [IU] via SUBCUTANEOUS
  Administered 2018-08-23: 1 [IU] via SUBCUTANEOUS
  Administered 2018-08-24: 3 [IU] via SUBCUTANEOUS
  Administered 2018-08-24: 17:00:00 5 [IU] via SUBCUTANEOUS
  Administered 2018-08-25: 10:00:00 2 [IU] via SUBCUTANEOUS
  Administered 2018-08-25: 01:00:00 3 [IU] via SUBCUTANEOUS

## 2018-08-22 MED ORDER — SODIUM CHLORIDE 0.9% FLUSH
10.0000 mL | Freq: Two times a day (BID) | INTRAVENOUS | Status: DC
  Administered 2018-08-22 – 2018-08-27 (×9): 10 mL

## 2018-08-22 MED ORDER — SODIUM CHLORIDE 0.9% FLUSH: 3.0000 mL | INTRAVENOUS | Status: DC | PRN

## 2018-08-22 MED ORDER — IOHEXOL 300 MG/ML  SOLN
10.0000 mL | Freq: Once | INTRAMUSCULAR | Status: AC | PRN
  Administered 2018-08-22: 10 mL via INTRAVENOUS

## 2018-08-22 MED ORDER — INSULIN REGULAR(HUMAN) IN NACL 100-0.9 UT/100ML-% IV SOLN: INTRAVENOUS | Status: DC

## 2018-08-22 MED ORDER — INSULIN GLARGINE 100 UNIT/ML ~~LOC~~ SOLN
12.0000 [IU] | Freq: Every day | SUBCUTANEOUS | Status: DC
  Administered 2018-08-22 – 2018-08-24 (×2): 12 [IU] via SUBCUTANEOUS
  Filled 2018-08-22 (×4): qty 0.12

## 2018-08-22 MED ORDER — MIDAZOLAM HCL 2 MG/2ML IJ SOLN
INTRAMUSCULAR | Status: AC
  Filled 2018-08-22: qty 2

## 2018-08-22 MED ORDER — ONDANSETRON HCL 4 MG PO TABS: 4.0000 mg | ORAL_TABLET | Freq: Four times a day (QID) | ORAL | Status: DC | PRN

## 2018-08-22 MED ORDER — FLUCONAZOLE 40 MG/ML PO SUSR
100.0000 mg | Freq: Every day | ORAL | Status: DC
  Administered 2018-08-23 – 2018-08-27 (×5): 100 mg
  Filled 2018-08-22 (×5): qty 2.5

## 2018-08-22 MED ORDER — IOHEXOL 350 MG/ML SOLN
100.0000 mL | Freq: Once | INTRAVENOUS | Status: AC | PRN
  Administered 2018-08-22: 100 mL via INTRAVENOUS

## 2018-08-22 MED ORDER — SODIUM CHLORIDE 0.9% FLUSH
3.0000 mL | Freq: Two times a day (BID) | INTRAVENOUS | Status: DC
  Administered 2018-08-23 – 2018-08-27 (×5): 3 mL via INTRAVENOUS

## 2018-08-22 MED ORDER — MAGIC MOUTHWASH
5.0000 mL | Freq: Three times a day (TID) | ORAL | Status: DC | PRN
  Administered 2018-08-24 – 2018-08-27 (×7): 5 mL via ORAL
  Filled 2018-08-22 (×10): qty 5

## 2018-08-22 MED ORDER — PRAMIPEXOLE DIHYDROCHLORIDE 1 MG PO TABS
1.0000 mg | ORAL_TABLET | Freq: Every day | ORAL | Status: DC
  Administered 2018-08-23 – 2018-08-26 (×4): 1 mg
  Filled 2018-08-22 (×6): qty 1

## 2018-08-22 MED ORDER — SODIUM CHLORIDE 0.9% FLUSH
3.0000 mL | INTRAVENOUS | Status: DC | PRN
  Administered 2018-08-23: 3 mL via INTRAVENOUS
  Filled 2018-08-22: qty 3

## 2018-08-22 MED ORDER — GLUCERNA 1.2 CAL PO LIQD
237.0000 mL | Freq: Every day | ORAL | Status: DC
  Administered 2018-08-22 – 2018-08-23 (×2): 237 mL
  Filled 2018-08-22 (×7): qty 237

## 2018-08-22 MED ORDER — MIDAZOLAM HCL 2 MG/2ML IJ SOLN
INTRAMUSCULAR | Status: AC | PRN
  Administered 2018-08-22: 1 mg via INTRAVENOUS

## 2018-08-22 NOTE — Progress Notes (Signed)
ANTICOAGULATION CONSULT NOTE  Pharmacy Consult for heparin Indication: pulmonary embolus  No Known Allergies  Patient Measurements: Height: 5\' 8"  (172.7 cm) Weight: 170 lb 9.6 oz (77.4 kg) IBW/kg (Calculated) : 68.4  Vital Signs: Temp: 98.2 F (36.8 C) (04/03 1717) Temp Source: Oral (04/03 1717) BP: 117/95 (04/03 2100) Pulse Rate: 102 (04/03 2100)  Labs: Recent Labs    08/20/18 1612 08/21/18 1817 08/22/18 0520 08/22/18 0523 08/22/18 1807 08/22/18 2129 08/22/18 2130  HGB 12.9* 11.8* 10.7*  --  9.9* 9.7*  --   HCT 38.0* 37.2* 33.1*  --  30.2* 29.9*  --   PLT  --  105* 90*  --  78* 74*  --   HEPARINUNFRC  --   --   --   --  0.25* 0.90*  --   CREATININE 0.90 1.33*  --  0.86  --   --   --   TROPONINI  --   --   --   --  0.56*  --  0.70*    Estimated Creatinine Clearance: 89.5 mL/min (by C-G formula based on SCr of 0.86 mg/dL).   Assessment: 58 y/o male SCC of left tonsil on chemotherapy who presented to the ED with near syncope and hyperglycemia. CTA chest shows bilateral PE with right heart strain. LLE duplex noted for DVT. Pharmacy consulted to begin IV heparin. Renal function is normal. No bleeding noted, Hgb low at 10.7, platelets are low at 90 and chemotherapy related per Oncology. Heparin 5000 units SQ given at 14:30.  Heparin level appears to be above goal at 0.9 however it was drawn from port-o-cath where heparin was infusing. Concerned this is a falsely elevated level. RN switched heparin infusion to peripheral IV so will re-draw heparin level.  Goal of Therapy:  Heparin level 0.3-0.7 units/ml Monitor platelets by anticoagulation protocol: Yes   Plan:  Continue heparin drip at 1300 units/hr Re-draw heparin level Daily heparin level and CBC Monitor for s/sx of bleeding   Renold Genta, PharmD, BCPS Clinical Pharmacist Clinical phone for 08/22/2018 until 10p is [phone] 08/22/2018 10:24 PM  **Pharmacist phone directory can now be found on [email or web address] listed  under [place]**

## 2018-08-22 NOTE — Progress Notes (Signed)
Patient transferred to [facility] ICU.  Report given to Georgina Snell, Therapist, sports.  Transport by bed by [facility] and [employer].

## 2018-08-22 NOTE — Progress Notes (Signed)
Initial Nutrition Assessment  RD working remotely.  DOCUMENTATION CODES:   Not applicable  INTERVENTION:   Suggest TF regimen as follows to meet 100% of energy and protein needs: 9 bottles of Glucerna 1.2 daily bolused via PEG Provides 2569 kcal and 138 g protein Sample schedule: 7am - 2 bottles, 30 mL water flush before and after 11 am - 1 1/2 bottles, 30 mL water flush before and after 2 pm - 2 bottles, 30 mL water flush before and after 5 pm - 2 bottles, 30 mL water flush before and after 10 pm - 1 1/2 bottles, 30 mL water flush before and after  If pt is meeting >50% of needs by mouth, suggest TF regimen as follows: 4 bottles of Glucerna 1.2 daily bolused via PEG Provides 1138 kcal and 57 g protein, meeting 49% of kcal and 62% of protein needs. Sample schedule: 2 pm - 2 bottles, 30 mL water flush before and after 8 pm - 2 bottles, 30 mL water flush before and after  NUTRITION DIAGNOSIS:   Inadequate enteral nutrition infusion related to catabolic illness as evidenced by estimated needs, percent weight loss.  GOAL:   Patient will meet greater than or equal to 90% of their needs  MONITOR:   PO intake, TF tolerance, Weight trends, Labs, I & O's  REASON FOR ASSESSMENT:   Consult Diet education, Enteral/tube feeding initiation and management  ASSESSMENT:   58 yo male, admitted with hyperglycemia. PMH significant for squamous cell carcinoma of the left tonsil, chemotherapy with induced anemia and thrombocytopenia.   Per 4/3 Oncology note, treatment includes adjuvant chemoradiation with weekly cisplatin (February-April 2020)  Port and PEG placed February 2020  Hyperglycemia of unknown etiology. Pt has no h/o diabetes, no measurement of Hgb A1c. RD consulted to adjust TF formula to Glucerna instead of Osmolite.   Labs: sodium 147 (H), potassium 3.3 (L), chloride 117 (H), glucose 130 mg/dL, BUN 31 (H)  Meds: Dulcolax, 30 mL ProStat TID, Novolog, NS at 125 mL/hr    Spoke with pt over the phone. He described his home TF regimen as follows: 7 bottles Osmolite + 8 x 8 oz water daily bolused via PEG. 2 bottles in the morning, 1 1/2 bottles twice in the afternoon, 2 bottles in the evening.   Reported mouth pain and dryness, as well as taste changes d/t chemo and radiation therapy. Can only tolerate water orally. Suggested Healios mouth wash, which contains glutamine.   Pt endorses significant weight loss.  NUTRITION - FOCUSED PHYSICAL EXAM: Deferred - RD working remotely  Diet Order:   Diet Order            Diet Carb Modified Fluid consistency: Thin; Room service appropriate? Yes  Diet effective now            PO Intake 0% x 1 meal recorded  EDUCATION NEEDS:  Education needs have been addressed  Skin:  Skin Assessment: Skin Integrity Issues: Skin Integrity Issues:: Other (Comment) Other: Radiation burn/peeling neck  Last BM:  4/2  Height:  Ht Readings from Last 1 Encounters:  08/22/18 5\' 8"  (1.727 m)   Weight:  Wt Readings from Last 10 Encounters:  08/22/18 77.4 kg  08/20/18 74.8 kg  08/20/18 73.1 kg  08/13/18 78.8 kg  08/06/18 79.9 kg  07/30/18 81.1 kg  07/23/18 82.3 kg  07/16/18 82.5 kg  07/09/18 83.3 kg  06/18/18 81.6 kg  4.9 kg wt loss x 1 month = 6%, may be indicative of malnutrition  Ideal Body Weight:  70 kg  BMI:  Body mass index is 25.94 kg/m. overweight  Estimated Nutritional Needs:   Kcal:  3009-7949 (30-35 kcal/kg)  Protein:  92-116 gm (1.2-1.5 g/kg)  Fluid:  >/= 1.5 L daily or per MD   Althea Grimmer, MS, RDN, LDN Pager: [phone] Available Mondays and Fridays, 9am-2pm

## 2018-08-22 NOTE — Progress Notes (Signed)
Checked patient blood sugar 120. Notified physician and he DC'd insulin drip. Stopped drip and did a STAT blood sugar check 119.  Patient is resting comfortably in bed. He is alert and oriented. Call bell within reach. I will continue to monitor patient.

## 2018-08-22 NOTE — Consult Note (Signed)
[place] NOTE  Patient Care Team: Glenn Arabian, MD as PCP - General (Family Medicine) Glenn Montane, MD as Consulting Physician (Otolaryngology) Glenn Gibson, MD as Attending Physician (Radiation Oncology) Glenn Men, MD as Consulting Physician (Hematology) Glenn Sauers, RN as Oncology Nurse Navigator Glenn Goodman, Glenn Goodman, Glenn Goodman as Speech Language Pathologist (Speech Pathology) Glenn Goodman, RD as Dietitian (Nutrition) Glenn Goodman, Glenn Goodman, Glenn Goodman as Physical Therapist (Physical Therapy) Glenn [facility], Glenn Goodman as Social Worker Glenn Ames, MD as Referring Physician (Otolaryngology)  HEME/ONC OVERVIEW: 1. Stage I (pT1pN1M0) left tonsil SCCa, p16+ -02/2018: CT neck showed enlarged left Level II and II LN with central necrosis (largest 2.4cm); effacement of the left IJ vein noted  -03/2018: US-guided bx of left cervical LN; path showed SCCa, p16+ -04/2018: PET showed FDG-avid left palatine tonsil lesion measuring ~1.3cm with associated FDG-avid left Level IIa LN's; no evidence of metastatic disease -05/2018: TORS by Dr. Raphael Gibney at [facility]; path showed HPV-positive SCCa of the left glossotonsillar sulcus, 1.6cm, focal margin positive, PNI+, 1/6 LN's positive (largest 6.8cm) with ECE; pT1pN1  -06/2018 - 08/2018: adjuvant chemoradiation with weekly cisplatin  2. Port and PEG placement in 06/2018   TREATMENT SUMMARY:  05/26/2018: TORS by Dr. Nicolette Bang at [facility]   07/07/2018 - 08/21/2018: adjuvant chemoradiation with weekly cisplatin 40mg /m2 (due to moderate hearing loss)  ASSESSMENT & PLAN:   Severe hyperglycemia -Etiology unclear; possibly due to undiagnosed pre-diabetes, exacerbated by tube feeding and high glucose intake  -Cisplatin not commonly associated with hyperglycemia or pancreas insufficiency -Glucose > 600 in clinic on 08/21/2018 and patient was admitted for hyperglycemic hyperosmolar syndrome -Since starting insulin ggt, patient's  glucose has improved markedly -I recommend prior to discharge, patient should be transitioned to a basal-bolus regimen to prevent any rebound hyperglycemia -In addition, as tube feed may have contributed to the hyperglycemia, the patient would benefit from nutrition consult to optimize his tube feed choices  -Finally, I emphasized with the patient that he should have close follow-up with PCP next week for further management of the hyperglycemia and adjustment of his insulin regimen as needed   Stage I (pT1pN1M0) left tonsil SCCa, p16+ -S/p TORS in 05/2018, followed by adjuvant chemoradiation  -Chemoradiation completed on 08/21/2018 -No further treatment planned at this time -We will plan to repeat surveillance scans in 2-3 months for assess for any evidence of residual disease   Chemotherapy-associated anemia -Secondary to chemotherapy -Hgb 10.7, slightly lower, likely due to dilution effect -Patient denies any symptom of bleeding -We will monitor for now  Chemotherapy-associated leukopenia -Secondary to chemotherapy -WBC 2.5k, slightly lower, likely due to dilution effect -Patient denies any symptoms of infection -We will monitor for now  Chemotherapy-associated thrombocytopenia -Secondary to chemotherapy -Plts 90k, slightly lower, likely due to dilution effect -Patient denies any symptoms of bleeding or excess bruising, such as epistaxis, hematochezia, melena, or hematuria -We will monitor for now  Patient has follow-up appt with oncology on 08/27/2018 for labs and clinic appt. Please do not hesitate to contact us if there are any questions.   Glenn Men, MD 08/22/2018 9:09 AM   CHIEF COMPLAINTS/PURPOSE OF CONSULTATION:  "I am doing a little better"  HISTORY OF PRESENTING ILLNESS:  Glenn Goodman is a 58 year old white male with history of stage I squamous of carcinoma of the left tonsil s/p TORS, followed by adjuvant chemoradiation, who was admitted for severe hyperglycemia.  The  patient was seen in in the oncology clinic on 08/20/2018 for routine  follow-up, and was found with glucose over 600.  He was sent to the ER for further evaluation, where he received 3 L of normal saline and discharged home with metformin.  However, he called the oncology clinic and the following day regarding persistent fatigue and malaise, and it was added in the infusion clinic for additional IV fluid support for dehydration.  While in the infusion clinic, he developed a vasovagal syncope after standing up, and briefly was unresponsive for 10 to 20 seconds.  He was taken to the ER a second time on 08/21/2018, and his glucose was over 1000.  Patient was initiated on insulin drip with improvement in his glucose.  He reports feeling much better this morning, but is still overall very weak.  I have reviewed his chart and materials related to his cancer extensively and collaborated history with the patient. Summary of oncologic history is as follows:   Squamous cell carcinoma of left tonsil ([place])   03/20/2018 Procedure    FNA of the left cervical LN in office    03/20/2018 Pathology Results    Non-diagnostic    04/07/2018 Procedure    US-guided left cervical LN biopsy/FNA     04/07/2018 Pathology Results    (Accession: [id number]) Lymph node, needle/core biopsy, left cervical - METASTATIC SQUAMOUS CELL CARCINOMA INVOLVING A LYMPH NODE (1/1) - SEE COMMENT  p16 IHC positive. EBV ISH negative.     04/22/2018 Pathology Results    (Accession: [id number]) Tonsil, biopsy, left, base of tongue - INVASIVE KERATINIZING SQUAMOUS CELL CARCINOMA.    04/29/2018 Imaging    PET:  IMPRESSION: 1. Left palatine tonsil/adjacent pharyngeal mucosal space lesion is asymmetric, measures about 1.3 cm in long axis, and has a maximum SUV of 8.2, compatible with malignancy. There is associated hypermetabolic left level IIa adenopathy as shown on prior exams. 2. Symmetric accentuated activity in the lymphoid tissue at  the tongue base, maximum SUV 5.3, quite likely physiologic given the symmetry.  3. No findings of metastatic disease Pred to the chest, abdomen/pelvis, or skeleton.    05/26/2018 Surgery    Radical tonsillectomy    05/26/2018 Pathology Results    PROCEDURE: Radical tonsillectomy TUMOR SITE: Glossotonsillar sulcus TUMOR LATERALITY: LEFT TUMOR FOCALITY: Unifocal TUMOR SIZE: GREATEST DIMENSION: 1.6 cm HISTOLOGIC TYPE: HPV-mediated squamous cell carcinoma MARGINS: Involved by invasive tumor (focal) Specify location of closest margin: Tongue base (soft tissue) Note: Although an additional tongue base mucosal margin was submitted, location of tumor extension to margin appears 1 cm from the mucosal surface. LYMPHOVASCULAR INVASION: Not identified PERINEURAL INVASION: Present REGIONAL LYMPH NODES:  NUMBER OF LYMPH NODES INVOLVED: 1  NUMBER OF LYMPH NODES EXAMINED: 6  LATERALITY OF LYMPH NODES INVOLVED: Ipsilateral  SIZE OF LARGEST METASTATIC DEPOSIT: 6.8 cm  EXTRANODAL EXTENSION: Present   Distance from lymph node capsule: 6 millimeters PATHOLOGIC STAGE CLASSIFICATION (pTNM, AJCC 8TH Ed): pT1 pN1 (HPV-mediated oropharynx tumor)  pT1: Tumor 2 cm or smaller in greatest dimension  pN1: Metastasis in 4 or fewer lymph nodes ANCILLARY STUDIES:  p16 expression by immunohistochemistry: Positive  HPV by PCR: pending    05/26/2018 Cancer Staging    Staging form: Pharynx - HPV-Mediated Oropharynx, AJCC 8th Edition - Pathologic stage from 05/26/2018: Stage I (pT1, pN1, cM0, p16+) - Signed by Glenn Men, MD on 06/18/2018    07/10/2018 - 07/10/2018 Chemotherapy    The patient had palonosetron (ALOXI) injection 0.25 mg, 0.25 mg, Intravenous,  Once, 0 of 3 cycles CISplatin (PLATINOL) 198 mg in sodium chloride  0.9 % 500 mL chemo infusion, 100 mg/m2 = 198 mg, Intravenous,  Once, 0 of 3 cycles fosaprepitant (EMEND) 150 mg, dexamethasone (DECADRON) 12 mg in sodium chloride 0.9 %  145 mL IVPB, , Intravenous,  Once, 0 of 3 cycles  for chemotherapy treatment.     07/10/2018 -  Chemotherapy    The patient had palonosetron (ALOXI) injection 0.25 mg, 0.25 mg, Intravenous,  Once, 6 of 6 cycles Administration: 0.25 mg (07/10/2018), 0.25 mg (07/18/2018), 0.25 mg (07/24/2018), 0.25 mg (07/31/2018), 0.25 mg (08/07/2018), 0.25 mg (08/14/2018) CISplatin (PLATINOL) 79 mg in sodium chloride 0.9 % 250 mL chemo infusion, 40 mg/m2 = 79 mg, Intravenous,  Once, 6 of 6 cycles Administration: 79 mg (07/10/2018), 79 mg (07/18/2018), 79 mg (07/24/2018), 79 mg (07/31/2018), 79 mg (08/07/2018), 79 mg (08/14/2018) fosaprepitant (EMEND) 150 mg, dexamethasone (DECADRON) 12 mg in sodium chloride 0.9 % 145 mL IVPB, , Intravenous,  Once, 6 of 6 cycles Administration:  (07/10/2018),  (07/18/2018),  (07/24/2018),  (07/31/2018),  (08/07/2018),  (08/14/2018)  for chemotherapy treatment.      Malignant neoplasm of base of tongue ([place])   05/09/2018 Initial Diagnosis    Malignant neoplasm of base of tongue ([place])    05/09/2018 Cancer Staging    Staging form: Pharynx - HPV-Mediated Oropharynx, AJCC 8th Edition - Clinical: Stage I (cT1, cN1, cM0, p16+) - Signed by Glenn Gibson, MD on 05/09/2018    06/11/2018 Cancer Staging    Staging form: Pharynx - HPV-Mediated Oropharynx, AJCC 8th Edition - Pathologic: Stage I (pT1, pN1, cM0, p16+) - Signed by Glenn Gibson, MD on 06/11/2018     MEDICAL HISTORY:  Past Medical History:  Diagnosis Date  . Arthritis   . Cancer ([place])   . OSA on CPAP   . Restless leg syndrome     SURGICAL HISTORY: Past Surgical History:  Procedure Laterality Date  . DIRECT LARYNGOSCOPY Left 04/22/2018   Dr. Janace Hoard  . FACIAL FRACTURE SURGERY Left 1986  . IR GASTROSTOMY TUBE MOD SED  06/30/2018  . IR IMAGING GUIDED PORT INSERTION  06/30/2018  . MODIFIED RADICAL NECK DISSECTION Left 05/26/2018   Dr. Nicolette Bang, [facility]  . transoral robotic surgery  05/26/2018   TORS, Dr. Nicolette Bang [facility]    SOCIAL  HISTORY: Social History   Socioeconomic History  . Marital status: Married    Spouse name: Not on file  . Number of children: 2  . Years of education: Not on file  . Highest education level: Not on file  Occupational History  . Not on file  Social Needs  . Financial resource strain: Not on file  . Food insecurity:    Worry: Not on file    Inability: Not on file  . Transportation needs:    Medical: No    Non-medical: No  Tobacco Use  . Smoking status: Never Smoker  . Smokeless tobacco: Never Used  Substance and Sexual Activity  . Alcohol use: Not Currently  . Drug use: Never  . Sexual activity: Not on file  Lifestyle  . Physical activity:    Days per week: Not on file    Minutes per session: Not on file  . Stress: Not on file  Relationships  . Social connections:    Talks on phone: Not on file    Gets together: Not on file    Attends religious service: Not on file    Active member of club or organization: Not on file    Attends meetings of clubs or organizations:  Not on file    Relationship status: Not on file  . Intimate partner violence:    Fear of current or ex partner: No    Emotionally abused: No    Physically abused: No    Forced sexual activity: No  Other Topics Concern  . Not on file  Social History Narrative  . Not on file    FAMILY HISTORY: Family History  Problem Relation Age of Onset  . Hypertension Mother   . Prostate cancer Father     ALLERGIES:  has No Known Allergies.  MEDICATIONS:  Current Facility-Administered Medications  Medication Dose Route Frequency Provider Last Rate Last Dose  . 0.9 %  sodium chloride infusion  1,000 mL Intravenous Continuous Elwyn Reach, MD 125 mL/hr at 08/21/18 1931 1,000 mL at 08/21/18 1931  . 0.9 %  sodium chloride infusion   Intravenous Continuous Elwyn Reach, MD 20 mL/hr at 08/22/18 0400    . bisacodyl (DULCOLAX) EC tablet 5 mg  5 mg Oral BID Gala Romney L, MD      . clotrimazole [facility])  troche 10 mg  10 mg Oral 5 X Daily Elwyn Reach, MD   10 mg at 08/22/18 0527  . dextrose 50 % solution 25 mL  25 mL Intravenous PRN Gala Romney L, MD      . feeding supplement (PRO-STAT SUGAR FREE 64) liquid 30 mL  30 mL Oral TID Gala Romney L, MD      . fluconazole (DIFLUCAN) tablet 100 mg  100 mg Oral Daily Garba, Mohammad L, MD      . heparin injection 5,000 Units  5,000 Units Subcutaneous Q8H Elwyn Reach, MD   5,000 Units at 08/22/18 0529  . insulin aspart (novoLOG) injection 0-20 Units  0-20 Units Subcutaneous Q4H Bodenheimer, Charles A, NP   4 Units at 08/22/18 [phone]  . lidocaine (XYLOCAINE) 2 % viscous mouth solution 5 mL  5 mL Mouth/Throat QID PRN Gala Romney L, MD      . LORazepam (ATIVAN) tablet 0.5 mg  0.5 mg Oral Q6H PRN Elwyn Reach, MD      . MEDLINE mouth rinse  15 mL Mouth Rinse BID Jonelle Sidle, Mohammad L, MD      . morphine (MSIR) tablet 15 mg  15 mg Oral Q4H PRN Jonelle Sidle, Mohammad L, MD      . ondansetron (ZOFRAN) tablet 4 mg  4 mg Oral Q6H PRN Elwyn Reach, MD       Or  . ondansetron (ZOFRAN) injection 4 mg  4 mg Intravenous Q6H PRN Gala Romney L, MD      . pramipexole (MIRAPEX) tablet 1 mg  1 mg Oral QHS Gala Romney L, MD   1 mg at 08/22/18 0039  . sodium chloride flush (NS) 0.9 % injection 10-40 mL  10-40 mL Intracatheter Q12H Garba, Mohammad L, MD      . sodium chloride flush (NS) 0.9 % injection 10-40 mL  10-40 mL Intracatheter PRN Elwyn Reach, MD        REVIEW OF SYSTEMS:   Constitutional: ( - ) fevers, ( - )  chills , ( - ) night sweats Eyes: ( - ) blurriness of vision, ( - ) double vision, ( - ) watery eyes Ears, nose, mouth, throat, and face: ( - ) mucositis, ( - ) sore throat Respiratory: ( - ) cough, ( - ) dyspnea, ( - ) wheezes Cardiovascular: ( - ) palpitation, ( - ) chest  discomfort, ( - ) lower extremity swelling Gastrointestinal:  ( - ) nausea, ( - ) heartburn, ( - ) change in bowel habits Skin: ( - ) abnormal skin  rashes Lymphatics: ( - ) new lymphadenopathy, ( - ) easy bruising Neurological: ( - ) numbness, ( - ) tingling, ( - ) new weaknesses Behavioral/Psych: ( - ) mood change, ( - ) new changes  All other systems were reviewed with the patient and are negative.  PHYSICAL EXAMINATION: ECOG PERFORMANCE STATUS: 2 - Symptomatic, <50% confined to bed  Vitals:   08/22/18 0232 08/22/18 0424  BP:  (!) 135/96  Pulse: (!) 103   Resp: 19 (!) 21  Temp:  (!) 97.5 F (36.4 C)  SpO2: 99% 100%   Filed Weights   08/21/18 1737 08/22/18 0008  Weight: 165 lb (74.8 kg) 170 lb 9.6 oz (77.4 kg)    GENERAL: alert, no distress, frail appearing  SKIN: left neck skin desquamation secondary to radiation with dried scabs, healing  EYES: conjunctiva are pink and non-injected, sclera clear OROPHARYNX: Grade 1 mucositis, dry oral mucosa  NECK: supple, non-tender LYMPH:  no palpable lymphadenopathy in the cervical LUNGS: clear to auscultation with normal breathing effort HEART: regular rate & rhythm, no murmurs, no lower extremity edema ABDOMEN: soft, non-tender, non-distended, normal bowel sounds Musculoskeletal: no cyanosis of digits and no clubbing  PSYCH: alert & oriented x 3, fluent speech NEURO: no focal motor/sensory deficits  LABORATORY DATA:  I have reviewed the data as listed Lab Results  Component Value Date   WBC 2.5 (L) 08/22/2018   HGB 10.7 (L) 08/22/2018   HCT 33.1 (L) 08/22/2018   MCV 96.8 08/22/2018   PLT 90 (L) 08/22/2018   Lab Results  Component Value Date   NA 147 (H) 08/22/2018   K 3.3 (L) 08/22/2018   CL 117 (H) 08/22/2018   CO2 23 08/22/2018    RADIOGRAPHIC STUDIES: I have personally reviewed the radiological images as listed and agreed with the findings in the report. Dg Chest Port 1 View  Result Date: 08/21/2018 CLINICAL DATA:  Head neck cancer. Hyperglycemia. EXAM: PORTABLE CHEST 1 VIEW COMPARISON:  None. FINDINGS: The heart size and mediastinal contours are within normal  limits. Both lungs are clear. The visualized skeletal structures are unremarkable. Port-A-Cath tip proximal RIGHT atrium. IMPRESSION: No active disease. Electronically Signed   By: Staci Righter M.D.   On: 08/21/2018 18:42    PATHOLOGY: I have reviewed the pathology reports as documented in the oncologist history.

## 2018-08-22 NOTE — Procedures (Signed)
Pre procedural Dx: Submassive Pulmonary Embolism Post procedural Dx: Same  Technically successful fluoro guided initiation of bilateral catheter directed pulmonary arterial catheter directed thrombolysis.  Unfortunately pre procedural PA pressure measurements were unable to be obtained secondary to device malfunction.  EBL: None Complications: None immediate  Ronny Bacon, MD Pager #: [phone]

## 2018-08-22 NOTE — Progress Notes (Signed)
1625- Blood sugar checked and machine= unable to read-blood sugar greater than 600. Dr. Maylon Peppers made aware and ordered 10 units of regular insulin.

## 2018-08-22 NOTE — Consult Note (Signed)
NAME:  Glenn Goodman, MRN:  [id number], DOB:  04/16/1961, LOS: 1 ADMISSION DATE:  08/21/2018, CONSULTATION DATE:  08/22/18 REFERRING MD:  Erlinda Hong  CHIEF COMPLAINT:  SOB   Brief History   Glenn Goodman is a 58 y.o. male who was admitted 4/2 with HHS.  Had hypoxia 4/3 and CTA demonstrated large PE.  Due to significant clot burden along with increasing O2 requirements, he was transferred to the ICU for EKOS.  History of present illness   Glenn Goodman is a 58 y.o. 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 Maylon Peppers).  He completed chemotherapy 1 week ago and XRT on 4/1.  He had XRT on 4/1 and while there, was noted to have severe hyperglycemia. He was discharged home with metformin; however, he called clinic the following day due to fatigue and malaise.  At clinic, he developed vasovagal syncope after standing up and had brief moment of unresponsiveness (10 - 20 seconds).  He was subsequently taken to the ED for further evaluation / management.  He was admitted for HHS / DKA and started on insulin gtt.  No known hx DM but had been on decadron in conjunction with chemo.  On 4/3, glucose had improved and he was transitioned to [place] insulin.  He was found to be hypoxic and required 4L O2 to maintain SpO2 in 90s.  He was also tachycardic with HR in 120's.  BP normal.  He had CTA that demonstrated large PE with RV / LV ratio of 2.  PCCM was called and asked to see in consultation.  He denies hx of prior VTE, hemoptysis, recent travel, LE edema.  Denies any recent surgical intervention, CVA / ICH, known intracranial metastases, trauma, significant bleeding, known bleeding diathesis, PUD.  Past Medical History  Stage I SCC of left tonsil s/p TORS followed by adjuvant chemotherapy, OSA on CPAP, RLS.  Significant Hospital Events   4/2 > admit. 4/3 > transferred to ICU for probable  EKOS.  Consults:  Oncology. IR.  Procedures:  4/3 > EKOS likely to start.  Significant Diagnostic Tests:  CTA chest 4/3 > moderate PE bilaterally with small saddle component.  RV / LV = 2.0. Echo 4/3 >  LE duplex 4/3 > prelim positive for LLE DVT. Abd [place] 4/3 > fatty infiltration of the liver.  Gallbladder wall thickening, possible chronic cholecystitis.  Micro Data:  None.  Antimicrobials:  None.   Interim history/subjective:  Comfortable on 4L O2.  SpO2 ranging from 92 - 98%.  HR in 120's. Currently getting echo.  Personal review shows dilated RV.  EF appears around 50% per echo tech. Currently denies chest pain, dyspnea. LE duplex positive for LLE DVT.  Objective:  Blood pressure (!) 120/92, pulse (!) 109, temperature 98.4 F (36.9 C), temperature source Oral, resp. rate (!) 21, height 5' 8" (1.727 m), weight 77.4 kg, SpO2 99 %.        Intake/Output Summary (Last 24 hours) at 08/22/2018 1719 Last data filed at 08/22/2018 1041 Gross per 24 hour  Intake 10 ml  Output 675 ml  Net -665 ml   Filed Weights   08/21/18 1737 08/22/18 0008  Weight: 74.8 kg 77.4 kg    Examination: General: Adult male, in NAD. Neuro: A&O x 3, no deficits. HEENT: Radiation burn / skin changes noted to neck and jawline. Sclerae anicteric.  EOMI. Cardiovascular: Tachy, regular, no M/R/G.  Lungs: Respirations even and unlabored.  CTA bilaterally, No W/R/R.  Abdomen: BS x 4, soft, NT/ND.  Musculoskeletal: No gross deformities, no edema.  Skin: Intact, warm, no rashes.  Assessment & Plan:   Submassive PE - extensive clot burden bilaterally.  Complicated by syncope prior to admit and now with hypoxia and increasing O2 requirements. - IR contacted for EKOS.  Dr. Pascal Lux has agreed to perform tonight. - Might require IVC filter given underlying malignancy (IR to discuss with CCM tomorrow 4/4 after PA pressures have been re-evaluated). - Continue heparin for now. - Follow up on formal echo and LE  duplex. - Will need lifelong anticoagulation with lovenox vs DOAC. - Oncology notified (Dr. Marin Olp on call at time of this consult).  Acute hypoxic respiratory failure - due to above. - Continue supplemental O2 as needed to maintain SpO2 > 92%.  HHS - Likely due to / exacerbated by decadron in conjunction with chemo.  Now improved after insulin gtt.  - Continue [place] insulin. - Will need anti-hyperglycemic regimen upon discharge (recently started on metformin).  AKI - anticipate worsening after dye load. Hypokalemia - s/p repletion. - Continue IVF's. - F/u AM labs.  Transaminitis - unclear etiology. ? Hepatic congestion in setting impaired RV function due to PE.  Abd [place] negative for acute process, possible chronic cholecystitis. - Trend LFT's.  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 - F/u with oncology.  Best Practice:  Diet: Carb Mod. Pain/Anxiety/Delirium protocol (if indicated): N/A. VAP protocol (if indicated): N/A. DVT prophylaxis: Heparin gtt. GI prophylaxis: N/A. Glucose control: SSI. Mobility: Bedrest. Code Status: Full. Family Communication: Wife updated by me over the phone. Disposition: ICU.  Labs   CBC: Recent Labs  Lab 08/20/18 1408 08/20/18 1602 08/20/18 1612 08/21/18 1817 08/22/18 0520  WBC 2.4* 2.1*  --  3.4* 2.5*  NEUTROABS 2.0 1.7  --  3.1  --   HGB 12.8* 12.4* 12.9* 11.8* 10.7*  HCT 38.8* 37.8* 38.0* 37.2* 33.1*  MCV 98.0 98.7  --  103.6* 96.8  PLT 129* 126*  --  105* 90*   Basic Metabolic Panel: Recent Labs  Lab 08/20/18 1408 08/20/18 1612 08/21/18 1817 08/22/18 0523  NA 139 135 135 147*  K 4.4 4.7 4.6 3.3*  CL 98  --  102 117*  CO2 25  --  19* 23  GLUCOSE 850*  --  1,047* 130*  BUN 42*  --  47* 31*  CREATININE 1.60* 0.90 1.33* 0.86  CALCIUM 9.5  --  7.9* 8.0*  MG 2.3  --   --   --    GFR: Estimated Creatinine Clearance: 89.5 mL/min (by C-G formula based on SCr of 0.86 mg/dL). Recent Labs  Lab  08/20/18 1408 08/20/18 1602 08/21/18 1817 08/22/18 0520  WBC 2.4* 2.1* 3.4* 2.5*   Liver Function Tests: Recent Labs  Lab 08/20/18 1603 08/21/18 1817 08/22/18 0523  AST 18 120* 116*  ALT 41 155* 203*  ALKPHOS 131* 134* 94  BILITOT 0.4 0.8 0.5  PROT 6.8 5.5* 5.1*  ALBUMIN 3.9 3.3* 3.0*   No results for input(s): LIPASE, AMYLASE in the last 168 hours. No results for input(s): AMMONIA in the last 168 hours. ABG    Component Value Date/Time   HCO3 22.5 08/21/2018 2030   TCO2 30 08/20/2018 1612   ACIDBASEDEF 3.5 (H) 08/21/2018 2030   O2SAT 41.9 08/21/2018 2030    Coagulation Profile: No results  for input(s): INR, PROTIME in the last 168 hours. Cardiac Enzymes: No results for input(s): CKTOTAL, CKMB, CKMBINDEX, TROPONINI in the last 168 hours. HbA1C: No results found for: HGBA1C CBG: Recent Labs  Lab 08/22/18 0202 08/22/18 0315 08/22/18 0422 08/22/18 0457 08/22/18 0817  GLUCAP 231* 167* 120* 119* 152*    Review of Systems:   All negative; except for those that are bolded, which indicate positives.  Constitutional: weight loss, weight gain, night sweats, fevers, chills, fatigue, weakness.  HEENT: headaches, sore throat, sneezing, nasal congestion, post nasal drip, difficulty swallowing, tooth/dental problems, visual complaints, visual changes, ear aches. Neuro: difficulty with speech, weakness, numbness, ataxia. CV:  chest pain, orthopnea, PND, swelling in lower extremities, dizziness, palpitations, syncope.  Resp: cough, hemoptysis, dyspnea, wheezing. GI: heartburn, indigestion, abdominal pain, nausea, vomiting, diarrhea, constipation, change in bowel habits, loss of appetite, hematemesis, melena, hematochezia.  GU: dysuria, change in color of urine, urgency or frequency, flank pain, hematuria. MSK: joint pain or swelling, decreased range of motion. Psych: change in mood or affect, depression, anxiety, suicidal ideations, homicidal ideations. Skin: rash, itching,  bruising.   Past medical history  He,  has a past medical history of Arthritis, Cancer ([place]), OSA on CPAP, and Restless leg syndrome.   Surgical History    Past Surgical History:  Procedure Laterality Date   DIRECT LARYNGOSCOPY Left 04/22/2018   Dr. Janace Hoard   FACIAL FRACTURE SURGERY Left 1986   IR GASTROSTOMY TUBE MOD SED  06/30/2018   IR IMAGING GUIDED PORT INSERTION  06/30/2018   MODIFIED RADICAL NECK DISSECTION Left 05/26/2018   Dr. Nicolette Bang, [facility]   transoral robotic surgery  05/26/2018   TORS, Dr. Nicolette Bang [place] History   reports that he has never smoked. He has never used smokeless tobacco. He reports previous alcohol use. He reports that he does not use drugs.   Family history   His family history includes Hypertension in his mother; Prostate cancer in his father.   Allergies No Known Allergies   Home meds  Prior to Admission medications   Medication Sig Start Date End Date Taking? Authorizing Provider  bisacodyl (DULCOLAX) 5 MG EC tablet Take 5 mg by mouth 2 (two) times daily.   Yes [provider]  clotrimazole (MYCELEX) 10 MG troche Take 1 lozenge (10 mg total) by mouth 5 (five) times daily for 30 days. 07/30/18 08/29/18 Yes Tish Men, MD  lidocaine (XYLOCAINE) 2 % solution Patient: Mix 1part 2% viscous lidocaine, 1part H20. Swish & swallow 21m of diluted mixture, 370m before meals and at bedtime, up to QID Patient taking differently: Use as directed 10 mLs in the mouth or throat 4 (four) times daily as needed for mouth pain. Mix 1 part 2% viscous lidocaine, 1 part H20. Swish & swallow 10 mL of diluted mixture, 30 min before meals and at bedtime, up to QID 07/14/18  Yes SqEppie GibsonMD  LORazepam (ATIVAN) 0.5 MG tablet Take 1 tablet (0.5 mg total) by mouth every 6 (six) hours as needed (Nausea or vomiting). 07/30/18  Yes ZhTish MenMD  metFORMIN (GLUCOPHAGE) 500 MG tablet Take 1 tablet (500 mg total) by mouth 2 (two) times daily with a meal. 08/20/18   Yes WeDaleen BoMD  morphine (MSIR) 15 MG tablet Take 1 tablet (15 mg total) by mouth every 4 (four) hours as needed for severe pain. 08/13/18  Yes ZhTish MenMD  Neomycin-Bacitracin-Polymyxin (NEOSPORIN EX) Apply 1 application topically daily as needed (neck wound).  Yes [provider]  Nutritional Supplements (FEEDING SUPPLEMENT, OSMOLITE 1.5 CAL,) LIQD Give 1.5 bottles Osmolite 1.5 Bid via PEG and 2 bottles Osmolite 1.5 via PEG for total of 7 bottles daily.  Give 60 cc free water before and after bolus feedings 4 times daily.  In addition consume or give via PEG 240 mL free water 3 times daily.  Please send formula and split gauze to patient.  He does not need other supplies at this time. Patient taking differently: Take 355.5-474 mLs by mouth See admin instructions. Take 474 ml in the morning, 474 ml at lunch, 355.5 ml at dinner and 355.5 ml at bedtime via PEG for total of 7 bottles daily. Give 60 cc free water before and after bolus feedings 4 times daily.  In addition consume or give via PEG 240 mL free water 3 times daily.  Please send formula and split gauze to patient 08/18/18  Yes Eppie Gibson, MD  Nutritional Supplements (PROMOD) LIQD Add 30 mL Promod TID. Continue 7 bottles Osmolite 1.5 daily. Patient taking differently: Take 30 mLs by mouth 3 (three) times daily.  08/18/18  Yes Eppie Gibson, MD  ondansetron (ZOFRAN) 8 MG tablet Take 1 tablet (8 mg total) by mouth 2 (two) times daily as needed. Start on the third day after chemotherapy. Patient taking differently: Take 8 mg by mouth 2 (two) times daily as needed for nausea or vomiting. Start on the third day after chemotherapy. 06/18/18  Yes Tish Men, MD  pramipexole (MIRAPEX) 1 MG tablet Take 1 mg by mouth at bedtime. 08/08/18  Yes [provider]  sodium fluoride (PREVIDENT 5000 PLUS) 1.1 % CREA dental cream Apply to tooth brush. Brush teeth for 2 minutes. Spit out excess. DO NOT rinse afterwards. Repeat  nightly. Patient taking differently: Place 1 application onto teeth at bedtime. Apply to tooth brush. Brush teeth for 2 minutes. Spit out excess. DO NOT rinse afterwards. Repeat nightly. 04/30/18  Yes Lenn Cal, DDS  Wound Dressings (SONAFINE EX) Apply 1 application topically daily as needed (neck wound).   Yes [provider]  blood glucose meter kit and supplies KIT Dispense based on patient and insurance preference. Use up to four times daily as directed. (FOR ICD-9 250.00, 250.01).   Monitor 2-3 times per day. 08/21/18   Tish Men, MD  dexamethasone (DECADRON) 4 MG tablet Take 2 tablets by mouth once a day on the day after chemotherapy and then take 2 tablets two times a day for 2 days. Take with food. Patient not taking: Reported on 08/21/2018 06/23/18   Tish Men, MD  fluconazole (DIFLUCAN) 100 MG tablet Take 1 tablet (100 mg total) by mouth daily for 7 days. 08/20/18 08/27/18  Tish Men, MD  insulin glargine (LANTUS) 100 unit/mL SOPN Inject 0.1 mLs (10 Units total) into the skin daily for 30 days. 08/21/18 09/20/18  Tish Men, MD  lidocaine-prilocaine (EMLA) cream Apply to affected area once Patient not taking: Reported on 08/21/2018 06/18/18   Tish Men, MD  phenazopyridine (PYRIDIUM) 200 MG tablet Take 1 tablet (200 mg total) by mouth 3 (three) times daily as needed for pain. Patient not taking: Reported on 07/23/2018 07/10/18   Harle Stanford., PA-C  prochlorperazine (COMPAZINE) 10 MG tablet Take 1 tablet (10 mg total) by mouth every 6 (six) hours as needed (Nausea or vomiting). Patient not taking: Reported on 08/20/2018 06/18/18   Tish Men, MD  silver sulfADIAZINE (SILVADENE) 1 % cream Apply 1 application topically 2 (two)  times daily.    [provider]    Montey Hora, [facility] Pager: [phone].  If no answer, [phone] 08/22/2018, 5:19 PM

## 2018-08-22 NOTE — Progress Notes (Signed)
Bilateral lower extremity venous duplex has been completed. Preliminary results can be found in CV Proc through chart review.  Results were given to the patient's nurse, Lattie Haw.  08/22/18 4:33 PM Carlos Levering RVT

## 2018-08-22 NOTE — Progress Notes (Signed)
  Echocardiogram 2D Echocardiogram has been performed.  Glenn Goodman 08/22/2018, 5:34 PM

## 2018-08-22 NOTE — Progress Notes (Signed)
ANTICOAGULATION CONSULT NOTE - Initial Consult  Pharmacy Consult for heparin Indication: pulmonary embolus  No Known Allergies  Patient Measurements: Height: 5' 8" (172.7 cm) Weight: 170 lb 9.6 oz (77.4 kg) IBW/kg (Calculated) : 68.4  Vital Signs: Temp: 98.4 F (36.9 C) (04/03 1240) Temp Source: Oral (04/03 1240) BP: 120/92 (04/03 1240) Pulse Rate: 109 (04/03 1240)  Labs: Recent Labs    08/20/18 1602 08/20/18 1612 08/21/18 1817 08/22/18 0520 08/22/18 0523  HGB 12.4* 12.9* 11.8* 10.7*  --   HCT 37.8* 38.0* 37.2* 33.1*  --   PLT 126*  --  105* 90*  --   CREATININE  --  0.90 1.33*  --  0.86    Estimated Creatinine Clearance: 89.5 mL/min (by C-G formula based on SCr of 0.86 mg/dL).   Medical History: Past Medical History:  Diagnosis Date  . Arthritis   . Cancer (HCC)   . OSA on CPAP   . Restless leg syndrome     Medications:  Medications Prior to Admission  Medication Sig Dispense Refill Last Dose  . bisacodyl (DULCOLAX) 5 MG EC tablet Take 5 mg by mouth 2 (two) times daily.   08/21/2018 at Unknown time  . clotrimazole (MYCELEX) 10 MG troche Take 1 lozenge (10 mg total) by mouth 5 (five) times daily for 30 days. 150 lozenge 1 08/21/2018 at Unknown time  . lidocaine (XYLOCAINE) 2 % solution Patient: Mix 1part 2% viscous lidocaine, 1part H20. Swish & swallow 10mL of diluted mixture, 30min before meals and at bedtime, up to QID (Patient taking differently: Use as directed 10 mLs in the mouth or throat 4 (four) times daily as needed for mouth pain. Mix 1 part 2% viscous lidocaine, 1 part H20. Swish & swallow 10 mL of diluted mixture, 30 min before meals and at bedtime, up to QID) 100 mL 5 08/20/2018 at Unknown time  . LORazepam (ATIVAN) 0.5 MG tablet Take 1 tablet (0.5 mg total) by mouth every 6 (six) hours as needed (Nausea or vomiting). 30 tablet 0 08/20/2018 at Unknown time  . metFORMIN (GLUCOPHAGE) 500 MG tablet Take 1 tablet (500 mg total) by mouth 2 (two) times daily with a  meal. 60 tablet 0 08/21/2018 at Unknown time  . morphine (MSIR) 15 MG tablet Take 1 tablet (15 mg total) by mouth every 4 (four) hours as needed for severe pain. 60 tablet 0 08/20/2018 at Unknown time  . Neomycin-Bacitracin-Polymyxin (NEOSPORIN EX) Apply 1 application topically daily as needed (neck wound).   Past Week at Unknown time  . Nutritional Supplements (FEEDING SUPPLEMENT, OSMOLITE 1.5 CAL,) LIQD Give 1.5 bottles Osmolite 1.5 Bid via PEG and 2 bottles Osmolite 1.5 via PEG for total of 7 bottles daily.  Give 60 cc free water before and after bolus feedings 4 times daily.  In addition consume or give via PEG 240 mL free water 3 times daily.  Please send formula and split gauze to patient.  He does not need other supplies at this time. (Patient taking differently: Take 355.5-474 mLs by mouth See admin instructions. Take 474 ml in the morning, 474 ml at lunch, 355.5 ml at dinner and 355.5 ml at bedtime via PEG for total of 7 bottles daily. Give 60 cc free water before and after bolus feedings 4 times daily.  In addition consume or give via PEG 240 mL free water 3 times daily.  Please send formula and split gauze to patient) 7 Bottle 0 08/21/2018 at Unknown time  . Nutritional Supplements (PROMOD)   LIQD Add 30 mL Promod TID. Continue 7 bottles Osmolite 1.5 daily. (Patient taking differently: Take 30 mLs by mouth 3 (three) times daily. ) 2 Bottle 1 08/21/2018 at Unknown time  . ondansetron (ZOFRAN) 8 MG tablet Take 1 tablet (8 mg total) by mouth 2 (two) times daily as needed. Start on the third day after chemotherapy. (Patient taking differently: Take 8 mg by mouth 2 (two) times daily as needed for nausea or vomiting. Start on the third day after chemotherapy.) 30 tablet 1 Over a month ago  . pramipexole (MIRAPEX) 1 MG tablet Take 1 mg by mouth at bedtime.   08/20/2018 at Unknown time  . sodium fluoride (PREVIDENT 5000 PLUS) 1.1 % CREA dental cream Apply to tooth brush. Brush teeth for 2 minutes. Spit out excess. DO  NOT rinse afterwards. Repeat nightly. (Patient taking differently: Place 1 application onto teeth at bedtime. Apply to tooth brush. Brush teeth for 2 minutes. Spit out excess. DO NOT rinse afterwards. Repeat nightly.) 1 Tube prn 08/20/2018 at Unknown time  . Wound Dressings (SONAFINE EX) Apply 1 application topically daily as needed (neck wound).   Past Week at Unknown time  . blood glucose meter kit and supplies KIT Dispense based on patient and insurance preference. Use up to four times daily as directed. (FOR ICD-9 250.00, 250.01).   Monitor 2-3 times per day. 1 each 0   . dexamethasone (DECADRON) 4 MG tablet Take 2 tablets by mouth once a day on the day after chemotherapy and then take 2 tablets two times a day for 2 days. Take with food. (Patient not taking: Reported on 08/21/2018) 30 tablet 1 Not Taking at Unknown time  . fluconazole (DIFLUCAN) 100 MG tablet Take 1 tablet (100 mg total) by mouth daily for 7 days. 7 tablet 0 Has not picked up rx yet  . insulin glargine (LANTUS) 100 unit/mL SOPN Inject 0.1 mLs (10 Units total) into the skin daily for 30 days. 15 mL 5 Has not picked up rx yet  . lidocaine-prilocaine (EMLA) cream Apply to affected area once (Patient not taking: Reported on 08/21/2018) 30 g 3 Not Taking at Unknown time  . phenazopyridine (PYRIDIUM) 200 MG tablet Take 1 tablet (200 mg total) by mouth 3 (three) times daily as needed for pain. (Patient not taking: Reported on 07/23/2018) 10 tablet 0 Not Taking at Unknown time  . prochlorperazine (COMPAZINE) 10 MG tablet Take 1 tablet (10 mg total) by mouth every 6 (six) hours as needed (Nausea or vomiting). (Patient not taking: Reported on 08/20/2018) 30 tablet 1 Not Taking at Unknown time  . silver sulfADIAZINE (SILVADENE) 1 % cream Apply 1 application topically 2 (two) times daily.   Has not picked up rx yet    Assessment: 58 y/o male SCC of left tonsil on chemotherapy who presented to the ED with near syncope and hyperglycemia. CTA chest shows  bilateral PE with right heart strain. Pharmacy consulted to begin IV heparin. Renal function is normal. No bleeding noted, Hgb low at 10.7, platelets are low at 90 and chemotherapy related per Oncology. Heparin 5000 units SQ given at 14:30.  Goal of Therapy:  Heparin level 0.3-0.7 units/ml Monitor platelets by anticoagulation protocol: Yes   Plan:  Begin heparin drip at 1300 units/hr with no bolus 6 hr heparin level Daily heparin level and CBC Monitor for s/sx of bleeding   Jennifer , PharmD, BCPS Clinical Pharmacist Clinical phone for 08/22/2018 until 10p is [phone] 08/22/2018 4:00 PM  **Pharmacist phone   directory can now be found on [email or web address] listed under [facility]**   

## 2018-08-22 NOTE — Progress Notes (Addendum)
PROGRESS NOTE  [facility] ZMO:[id number] DOB: 03/14/1961 DOA: 08/21/2018 PCP: [name], MD  HPI/Recap of past 24 hours:  Feel weak, denies pain, no fever, no sob  Assessment/Plan: Principal Problem:   Hyperglycemia Active Problems:   Squamous cell carcinoma of left tonsil (HCC)   Sensorineural hearing loss (SNHL) of both ears   Oral candidiasis   AKI (acute kidney injury) ([place])   Leukopenia due to antineoplastic chemotherapy (HCC)   Chemotherapy-induced thrombocytopenia   Antineoplastic chemotherapy induced anemia  Hyperglycemia: with mild DKA on presentation Blood glucose 1047 on presentation. Gap closed ( from 16 to 7), blood glucose much improved after insulin drip and ivf He is transitioned to subQ insulin, continue hydration Possible cause of hyperglycemia, including tube feeds , recent steroids use, and possible underline diabetes Change tube feeds from osmolites to glucerna, nutrition input appreciated, continue hydration Will need glucometer, diabetic testing supplies and insulin prescription at discharge  Near syncope: -happened while he was at the cancer center, rapid response was called, he was brought to the ED -Likely due to dehydration, continue hydration, keep on tele,  Will rule out PE  Acute hypoxic respiratory failure: Patient o2 sats was in 70's while at the cancer center per rapid response RN note cxr no acute infiltrate Currently on 4liter o2, denies sob, no cough, no chest pain, will get CTA to r/o PE  Addendum: CTA + for bilateral PE with right heart strain, case discussed with critical care who recommended checking tropoinin/bnp/lactic acid Will get echo/venous doppler, start heparin drip Will follow critical care recommendation Patient and wife updated  Hypokalemia: replace k, repeat lab in am, also check mag  AKI; Likely prerenal  Cr 1.33 on presentation, cr normalized today is 0.8  Elevated lft:  he denies ab pain, no n/v Will  get ab [place] and hepatitis panel, repeat lab in am   Pancytopenia:  possible chemo related (anemia, thrombocytopenia, leukopenia) Monitor, hematology/oncology input appreciated  Mucositis: magic mouth wash  Stage I (pT1pN1M0) left tonsil SCCa, p16+ Plan per oncology Dr Maylon Peppers: -S/p TORS in 05/2018, followed by adjuvant chemoradiation  -Chemoradiation completed on 08/21/2018 -No further treatment planned at this time -We will plan to repeat surveillance scans in 2-3 months for assess for any evidence of residual disease    Code Status: full  Family Communication: patient   Disposition Plan: likely d/c in am    Consultants:  Hematology/oncology  Procedures:  none  Antibiotics:  none   Objective: BP (!) 141/96 (BP Location: Right Arm)   Pulse (!) 114   Temp (!) 97.5 F (36.4 C) (Oral)   Resp (!) 25   Ht 5\' 8"  (1.727 m)   Wt 77.4 kg   SpO2 100%   BMI 25.94 kg/m   Intake/Output Summary (Last 24 hours) at 08/22/2018 1119 Last data filed at 08/22/2018 1041 Gross per 24 hour  Intake 10 ml  Output 675 ml  Net -665 ml   Filed Weights   08/21/18 1737 08/22/18 0008  Weight: 74.8 kg 77.4 kg    Exam: Patient is examined daily including today on 08/22/2018, exams remain the same as of yesterday except that has changed    General:  NAD  Cardiovascular: RRR  Respiratory: CTABL  Abdomen: peg tube in place, Soft/ND/NT, positive BS  Musculoskeletal: No Edema  Neuro: alert, oriented   Data Reviewed: Basic Metabolic Panel: Recent Labs  Lab 08/20/18 1408 08/20/18 1612 08/21/18 1817 08/22/18 0523  NA 139 135 135 147*  K 4.4 4.7 4.6 3.3*  CL 98  --  102 117*  CO2 25  --  19* 23  GLUCOSE 850*  --  1,047* 130*  BUN 42*  --  47* 31*  CREATININE 1.60* 0.90 1.33* 0.86  CALCIUM 9.5  --  7.9* 8.0*  MG 2.3  --   --   --    Liver Function Tests: Recent Labs  Lab 08/20/18 1603 08/21/18 1817 08/22/18 0523  AST 18 120* 116*  ALT 41 155* 203*  ALKPHOS 131* 134* 94   BILITOT 0.4 0.8 0.5  PROT 6.8 5.5* 5.1*  ALBUMIN 3.9 3.3* 3.0*   No results for input(s): LIPASE, AMYLASE in the last 168 hours. No results for input(s): AMMONIA in the last 168 hours. CBC: Recent Labs  Lab 08/20/18 1408 08/20/18 1602 08/20/18 1612 08/21/18 1817 08/22/18 0520  WBC 2.4* 2.1*  --  3.4* 2.5*  NEUTROABS 2.0 1.7  --  3.1  --   HGB 12.8* 12.4* 12.9* 11.8* 10.7*  HCT 38.8* 37.8* 38.0* 37.2* 33.1*  MCV 98.0 98.7  --  103.6* 96.8  PLT 129* 126*  --  105* 90*   Cardiac Enzymes:   No results for input(s): CKTOTAL, CKMB, CKMBINDEX, TROPONINI in the last 168 hours. BNP (last 3 results) No results for input(s): BNP in the last 8760 hours.  ProBNP (last 3 results) No results for input(s): PROBNP in the last 8760 hours.  CBG: Recent Labs  Lab 08/22/18 0202 08/22/18 0315 08/22/18 0422 08/22/18 0457 08/22/18 0817  GLUCAP 231* 167* 120* 119* 152*    No results found for this or any previous visit (from the past 240 hour(s)).   Studies: Dg Chest Port 1 View  Result Date: 08/21/2018 CLINICAL DATA:  Head neck cancer. Hyperglycemia. EXAM: PORTABLE CHEST 1 VIEW COMPARISON:  None. FINDINGS: The heart size and mediastinal contours are within normal limits. Both lungs are clear. The visualized skeletal structures are unremarkable. Port-A-Cath tip proximal RIGHT atrium. IMPRESSION: No active disease. Electronically Signed   By: Staci Righter M.D.   On: 08/21/2018 18:42    Scheduled Meds: . bisacodyl  5 mg Oral BID  . clotrimazole  10 mg Oral 5 X Daily  . feeding supplement (PRO-STAT SUGAR FREE 64)  30 mL Oral TID  . fluconazole  100 mg Oral Daily  . heparin  5,000 Units Subcutaneous Q8H  . insulin aspart  0-20 Units Subcutaneous Q4H  . mouth rinse  15 mL Mouth Rinse BID  . pramipexole  1 mg Oral QHS  . sodium chloride flush  10-40 mL Intracatheter Q12H    Continuous Infusions: . sodium chloride 1,000 mL (08/21/18 1931)  . sodium chloride 20 mL/hr at 08/22/18 0400      Time spent: 45mins I have personally reviewed and interpreted on  08/22/2018 daily labs, tele strips, imagings as discussed above under date review session and assessment and plans.  I reviewed all nursing notes, pharmacy notes, consultant notes,  vitals, pertinent old records  I have discussed plan of care as described above with RN , patient on 08/22/2018   Florencia Reasons MD, PhD  [facility] Pager [phone]. If 7PM-7AM, please contact night-coverage at [email or web address], password [facility] 08/22/2018, 11:19 AM  LOS: 1 day

## 2018-08-22 NOTE — Progress Notes (Signed)
Pt received from [facility] via [facility] with insulin and 0.9% NS running. Pt oriented to room and equipment. CHG bath given. Vitals stable - T 97.5, BP 123/89, HR 115, O2 100% on non-rebreather at 10L. Per [facility] report desat to 88% on room air, will attempt to wean. Pt denies pain. CBG obtained. Call bell within reach, will continue to monitor.   IV team consulted to assess accessed port dressing as it was sealed with paper tape.   MD paged for Glucostabilizer orders, orders received.   Loel Dubonnet, RN

## 2018-08-22 NOTE — Progress Notes (Signed)
Inpatient Diabetes Program Recommendations  AACE/ADA: New Consensus Statement on Inpatient Glycemic Control (2015)  Target Ranges:  Prepandial:   less than 140 mg/dL      Peak postprandial:   less than 180 mg/dL (1-2 hours)      Critically ill patients:  140 - 180 mg/dL   Lab Results  Component Value Date   GLUCAP 152 (H) 08/22/2018    Review of Glycemic Control Results for Glenn Goodman, Glenn Goodman (MRN [id number]) as of 08/22/2018 11:08  Ref. Range 08/22/2018 04:22 08/22/2018 04:57 08/22/2018 08:17  Glucose-Capillary Latest Ref Range: 70 - 99 mg/dL 120 (H) 119 (H) 152 (H)   Diabetes history: No DM hx Outpatient Diabetes medications: Metformin 500 mg BID (prescribed by ED MD on 4/1) Current orders for Inpatient glycemic control: Novolog 0-20 units Q4H  Inpatient Diabetes Program Recommendations:    Patient was in ER on 4/1 with hyperglycemia, discharge home with Metformin 500 mg BID.  Of note, Patient had been taking Decadron 8 mg QD following chemotherapy days and following 2 days post. Has completed this course and per MD will not be continuing this at DC.   Consider: -adding A1C to determine glycemic control.  -Decrease Novolog 0-9 unit TID  - If to add tube feeds, add Novolog 3 units Q4H (to be held or stopped if tube feeds are held for any reason).  - Add Lantus 12 units QHS.   @1100 - Discussed hyperglycemia and diabetes with patient and wife, via phone. Verified that patient had recently been to [facility] ED and was prescribed Metformin, however, he did not feel that it worked. Is no longer taking steroids, as his course of chemo/radiation is complete. Has been doing tube feeds at home. Explained what a A1c is and what it measures. Also reviewed goal A1c with patient, importance of good glucose control @ home, and blood sugar goals. Reviewed patho of DM, need for insulin, role of pancreas, survival skills, long acting versus short acting insulin, vascular changes and co-morbidies associated with DM.   Patient's wife plans to pick up meter from pharmacy. Noted that order had been placed, however pharmacy is not able to see order. MD please place additional discharge order for blood glucose meter kit (includes lancets and strips) ([id number]). Reviewed to begin checking 2-3 times per day and best practice of checking glucose. Will attach in discharge summary.  Patient is still able to take in fluids by mouth. Patient states, "I usually just drink water." Commended him for this and explained the importance of being mindful with sugar intake of fluids.  Patient is preferring insulin pen. Wife is RN and feels that she will be able to perform injections. Attached links for videos, given COVID for review. Educated patient and spouse on insulin pen use at home. Reviewed contents of insulin flexpen starter kit. Reviewed all steps if insulin pen including attachment of needle, 2-unit air shot, dialing up dose, giving injection, removing needle, disposal of sharps, storage of unused insulin, disposal of insulin etc. Also reviewed troubleshooting with insulin pen. MD to give patient Rxs for insulin pens and insulin pen needles. Encouraged patient to begin working with RNs for self injections and placed care order instruction.   @1515 - Attempted to reach out to patient to see that he viewed attached links for education. No answer. Will attempt again at a later time.    Thanks, Bronson Curb, MSN, RNC-OB Diabetes Coordinator [phone] (8a-5p)

## 2018-08-23 ENCOUNTER — Encounter (HOSPITAL_COMMUNITY): Payer: Self-pay | Admitting: Interventional Radiology

## 2018-08-23 ENCOUNTER — Inpatient Hospital Stay (HOSPITAL_COMMUNITY): Payer: Commercial Managed Care - PPO

## 2018-08-23 ENCOUNTER — Inpatient Hospital Stay: Payer: Commercial Managed Care - PPO

## 2018-08-23 DIAGNOSIS — I2699 Other pulmonary embolism without acute cor pulmonale: Secondary | ICD-10-CM

## 2018-08-23 HISTORY — PX: IR THROMB F/U EVAL ART/VEN FINAL DAY (MS): IMG5379

## 2018-08-23 LAB — HEPARIN LEVEL (UNFRACTIONATED)
Heparin Unfractionated: 0.91 IU/mL — ABNORMAL HIGH (ref 0.30–0.70)
Heparin Unfractionated: 0.65 IU/mL (ref 0.30–0.70)
Heparin Unfractionated: 0.34 IU/mL (ref 0.30–0.70)

## 2018-08-23 LAB — COMPREHENSIVE METABOLIC PANEL
ALT: 190 U/L — ABNORMAL HIGH (ref 0–44)
AST: 73 U/L — ABNORMAL HIGH (ref 15–41)
Albumin: 2.8 g/dL — ABNORMAL LOW (ref 3.5–5.0)
Alkaline Phosphatase: 83 U/L (ref 38–126)
Anion gap: 8 (ref 5–15)
BUN: 15 mg/dL (ref 6–20)
CO2: 26 mmol/L (ref 22–32)
Calcium: 7.6 mg/dL — ABNORMAL LOW (ref 8.9–10.3)
Chloride: 109 mmol/L (ref 98–111)
Creatinine, Ser: 0.69 mg/dL (ref 0.61–1.24)
GFR calc Af Amer: >60 mL/min (ref >60)
GFR calc non Af Amer: >60 mL/min (ref >60)
Glucose, Bld: 131 mg/dL — ABNORMAL HIGH (ref 70–99)
Potassium: 3 mmol/L — ABNORMAL LOW (ref 3.5–5.1)
Sodium: 143 mmol/L (ref 135–145)
Total Bilirubin: 1 mg/dL (ref 0.3–1.2)
Total Protein: 4.6 g/dL — ABNORMAL LOW (ref 6.5–8.1)

## 2018-08-23 LAB — CBC
HCT: 32.1 % — ABNORMAL LOW (ref 39.0–52.0)
HCT: 33.1 % — ABNORMAL LOW (ref 39.0–52.0)
Hemoglobin: 10.5 g/dL — ABNORMAL LOW (ref 13.0–17.0)
Hemoglobin: 10.9 g/dL — ABNORMAL LOW (ref 13.0–17.0)
MCH: 32.2 pg (ref 26.0–34.0)
MCH: 31.4 pg (ref 26.0–34.0)
MCHC: 32.7 g/dL (ref 30.0–36.0)
MCHC: 32.9 g/dL (ref 30.0–36.0)
MCV: 98.5 fL (ref 80.0–100.0)
MCV: 95.4 fL (ref 80.0–100.0)
Platelets: 78 10*3/uL — ABNORMAL LOW (ref 150–400)
Platelets: 83 10*3/uL — ABNORMAL LOW (ref 150–400)
RBC: 3.26 MIL/uL — ABNORMAL LOW (ref 4.22–5.81)
RBC: 3.47 MIL/uL — ABNORMAL LOW (ref 4.22–5.81)
RDW: 16.2 % — ABNORMAL HIGH (ref 11.5–15.5)
RDW: 16 % — ABNORMAL HIGH (ref 11.5–15.5)
WBC: 1.9 10*3/uL — ABNORMAL LOW (ref 4.0–10.5)
WBC: 2 10*3/uL — ABNORMAL LOW (ref 4.0–10.5)
nRBC: 4.3 % — ABNORMAL HIGH (ref 0.0–0.2)
nRBC: 3 % — ABNORMAL HIGH (ref 0.0–0.2)

## 2018-08-23 LAB — TROPONIN I: Troponin I: 0.52 ng/mL (ref <0.03)

## 2018-08-23 LAB — LIPASE, BLOOD: Lipase: 17 U/L (ref 11–51)

## 2018-08-23 LAB — FIBRINOGEN
Fibrinogen: 281 mg/dL (ref 210–475)
Fibrinogen: 314 mg/dL (ref 210–475)

## 2018-08-23 LAB — GLUCOSE, CAPILLARY
Glucose-Capillary: 122 mg/dL — ABNORMAL HIGH (ref 70–99)
Glucose-Capillary: 114 mg/dL — ABNORMAL HIGH (ref 70–99)
Glucose-Capillary: 203 mg/dL — ABNORMAL HIGH (ref 70–99)
Glucose-Capillary: 140 mg/dL — ABNORMAL HIGH (ref 70–99)

## 2018-08-23 LAB — HEMOGLOBIN A1C
Hgb A1c MFr Bld: 10.2 % — ABNORMAL HIGH (ref 4.8–5.6)
Mean Plasma Glucose: 246.04 mg/dL

## 2018-08-23 LAB — MAGNESIUM: Magnesium: 2 mg/dL (ref 1.7–2.4)

## 2018-08-23 MED ORDER — POTASSIUM CHLORIDE 20 MEQ/15ML (10%) PO SOLN
40.0000 meq | ORAL | Status: AC
  Administered 2018-08-23 (×2): 40 meq
  Filled 2018-08-23 (×2): qty 30

## 2018-08-23 MED ORDER — POTASSIUM CHLORIDE CRYS ER 20 MEQ PO TBCR
40.0000 meq | EXTENDED_RELEASE_TABLET | ORAL | Status: DC
  Administered 2018-08-23: 08:00:00 40 meq via ORAL
  Filled 2018-08-23: qty 2

## 2018-08-23 MED ORDER — MIDAZOLAM HCL 2 MG/2ML IJ SOLN
INTRAMUSCULAR | Status: AC | PRN
  Administered 2018-08-22: 0.5 mg via INTRAVENOUS

## 2018-08-23 MED ORDER — FENTANYL CITRATE (PF) 100 MCG/2ML IJ SOLN
INTRAMUSCULAR | Status: AC | PRN
  Administered 2018-08-22: 25 ug via INTRAVENOUS

## 2018-08-23 MED ORDER — NON FORMULARY: 237.0000 mL | Freq: Every day | Status: DC

## 2018-08-23 MED ORDER — GLUCERNA 1.5 CAL PO LIQD
237.0000 mL | Freq: Every day | ORAL | Status: DC
  Filled 2018-08-23: qty 1000

## 2018-08-23 MED ORDER — NON FORMULARY: 474.0000 mL | Freq: Three times a day (TID) | Status: DC

## 2018-08-23 MED ORDER — GLUCERNA 1.5 CAL PO LIQD
474.0000 mL | Freq: Three times a day (TID) | ORAL | Status: DC
  Filled 2018-08-23 (×5): qty 1000

## 2018-08-23 MED ORDER — GLUCERNA 1.5 CAL PO LIQD
474.0000 mL | Freq: Three times a day (TID) | ORAL | Status: DC
  Filled 2018-08-23: qty 1000

## 2018-08-23 NOTE — Progress Notes (Signed)
ANTICOAGULATION CONSULT NOTE - Follow Up Consult  Pharmacy Consult for heparin Indication: pulmonary embolus  Labs: Recent Labs    08/21/18 1817  08/22/18 0523  08/22/18 1807 08/22/18 2129 08/22/18 2130 08/22/18 2223 08/23/18 0506 08/23/18 0507 08/23/18 0508 08/23/18 1044  HGB 11.8*   < >  --   --  9.9* 9.7*  --   --   --  10.5*  --   --   HCT 37.2*   < >  --   --  30.2* 29.9*  --   --   --  32.1*  --   --   PLT 105*   < >  --   --  78* 74*  --   --   --  78*  --   --   HEPARINUNFRC  --   --   --    < > 0.25* 0.90*  --  0.88* 0.91*  --   --  0.65  CREATININE 1.33*  --  0.86  --   --   --   --   --   --   --  0.69  --   TROPONINI  --   --   --   --  0.56*  --  0.70*  --   --   --  0.52*  --    < > = values in this interval not displayed.    Assessment: 58yo male supratherapeutic on heparin with initial dosing for PE while on EKOS therapy Heparin level therapeutic  Goal of Therapy:  Heparin level 0.3-0.7 units/ml   Plan:  Heparin at 900 units / hr 6 hour heparin level to confirm  Thank you Anette Guarneri, PharmD 08/23/2018,11:48 AM

## 2018-08-23 NOTE — Procedures (Signed)
Pre procedural Dx: Submassive PE, post initiation of bilateral pulmonary arterial thrombolysis.  Post procedural Dx: Same  Technically successful completion of catheter directed bilateral pulmonary arterial thrombolysis  Main pulmonary arterial pressure measurements: Pre procedural - unable to be obtained d/t device malfunction Post procedural - 42/26 (mean - 33)  Keep right leg flat for 2 hrs (Until 37:10)  Complications: None immediate  Ronny Bacon, MD Pager #: [phone]
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
## 2018-08-23 NOTE — Progress Notes (Signed)
Pt transferred to the unit via wheelchair with staff in stable condition. Call bell within reach, bed in lowest position, and pt instructed to call for assistance. Pt placed on telemetry. Will continue to monitor.

## 2018-08-23 NOTE — Progress Notes (Addendum)
ANTICOAGULATION CONSULT NOTE - Follow Up Consult  Pharmacy Consult for heparin Indication: pulmonary embolus  Labs: Recent Labs    08/20/18 1612 08/21/18 1817 08/22/18 0520 08/22/18 0523 08/22/18 1807 08/22/18 2129 08/22/18 2130 08/22/18 2223  HGB 12.9* 11.8* 10.7*  --  9.9* 9.7*  --   --   HCT 38.0* 37.2* 33.1*  --  30.2* 29.9*  --   --   PLT  --  105* 90*  --  78* 74*  --   --   HEPARINUNFRC  --   --   --   --  0.25* 0.90*  --  0.88*  CREATININE 0.90 1.33*  --  0.86  --   --   --   --   TROPONINI  --   --   --   --  0.56*  --  0.70*  --     Assessment: 57yo male supratherapeutic on heparin with initial dosing for PE while on EKOS therapy; no gtt issues or signs of bleeding per RN; heparin gtt moved to peripheral line to ensure accurate labs; fibrinogen trending down slowly.  Goal of Therapy:  Heparin level 0.3-0.7 units/ml   Plan:  Will decrease heparin gtt by ~2-3 units/kg/hr to 1100 units/hr and check level with next lab draw.    Wynona Neat, PharmD, BCPS  08/23/2018,12:08 AM   Addendum: Heparin level has barely moved after rate change, likely accumulating.  RN reports no gtt issues or signs of bleeding.  Will decrease heparin gtt by 2-3 units/kg/hr to 900 units/hr and check level with next lab draw.  VB 6:38 AM

## 2018-08-23 NOTE — Consult Note (Signed)
NAME:  Glenn Goodman, MRN:  [id number], DOB:  04/11/1961, LOS: 2 ADMISSION DATE:  08/21/2018, CONSULTATION DATE:  08/22/18 REFERRING MD:  Erlinda Hong  CHIEF COMPLAINT:  SOB   Brief History    Glenn Goodman is a 58 y.o. 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 Maylon Peppers).  He completed chemotherapy 1 week ago and XRT on 4/1.  He had XRT on 4/1 and while there, was noted to have severe hyperglycemia. He was discharged home with metformin; however, he called clinic the following day due to fatigue and malaise.  At clinic, he developed vasovagal syncope after standing up and had brief moment of unresponsiveness (10 - 20 seconds).  He was subsequently taken to the ED for further evaluation / management.  He was admitted for HHS / DKA and started on insulin gtt.  No known hx DM but had been on decadron in conjunction with chemo.  On 4/3, glucose had improved and he was transitioned to [place] insulin.  He was found to be hypoxic and required 4L O2 to maintain SpO2 in 90s.  He was also tachycardic with HR in 120's.  BP normal.  He had CTA that demonstrated large PE with RV / LV ratio of 2.  PCCM was called and asked to see in consultation.  He denies hx of prior VTE, hemoptysis, recent travel, LE edema.  Denies any recent surgical intervention, CVA / ICH, known intracranial metastases, trauma, significant bleeding, known bleeding diathesis, PUD.  Past Medical History  Stage I SCC of left tonsil s/p TORS followed by adjuvant chemotherapy, OSA on CPAP, RLS.  Significant Hospital Events   4/2 > admit. 4/3 > transferred to ICU for probable EKOS.` -Comfortable on 4L O2.  SpO2 ranging from 92 - 98%.  HR in 120's. Currently getting echo.  Personal review shows dilated RV.  EF appears around 50% per echo tech. Currently denies chest pain, dyspnea. LE duplex positive for LLE DVT.  PESI score 139, Class 5 at  time of eval by PCCM -> EKOS   Consults:  Oncology. IR.  Procedures:  4/3 > EKOS likely to start.  Significant Diagnostic Tests:  CTA chest 4/3 > moderate PE bilaterally with small saddle component.  RV / LV = 2.0. Echo 4/3 >  LE duplex 4/3 > prelim positive for LLE DVT. Abd [place] 4/3 > fatty infiltration of the liver.  Gallbladder wall thickening, possible chronic cholecystitis.  Micro Data:  None.  Antimicrobials:  None.   Interim history/subjective:    08/23/2018 -> s/p EKOS ysterday. D/w IR = will hold off on IVC filter. No contraindication to anticoagulation. . Now on RA with pulse 92 to 95%. Mild epistaxis and [place] o2 removed. Sheath removed . Feeling ok  Objective:  Blood pressure 106/81, pulse 99, temperature (!) 97.4 F (36.3 C), temperature source Axillary, resp. rate (!) 26, height 5' 8"  (1.727 m), weight 77.4 kg, SpO2 97 %.        Intake/Output Summary (Last 24 hours) at 08/23/2018 0908 Last data filed at 08/23/2018 0600 Gross per 24 hour  Intake 1201.29 ml  Output 1650 ml  Net -448.71 ml   Filed Weights   08/21/18 1737 08/22/18 0008  Weight: 74.8 kg 77.4 kg   General Appearance:  Looks well Head:  Normocephalic, without obvious abnormality, atraumatic Eyes:  PERRL - yes, conjunctiva/corneas - clear  Ears:  Normal external ear canals, both ears Nose:  G tube - no Throat:  ETT TUBE - no , OG tube - no Neck:  Supple,  No enlargement/tenderness/nodules Lungs: Clear to auscultation bilaterally, Heart:  S1 and S2 normal, no murmur, CVP - no.  Pressors - no Abdomen:  Soft, no masses, no organomegaly Genitalia / Rectal:  Normal, Foley cath +, Rt groin bandage +. No bleeding Extremities:  Extremities- intact Skin:  ntact in exposed areas . Sacral area - not eamined Neurologic:  Sedation - none -> RASS - +1 . Moves all 4s - yes. CAM-ICU - neg . Orientation - x3+      LABS    PULMONARY Recent Labs  Lab 08/20/18 1612 08/21/18 2030  HCO3 29.1* 22.5    TCO2 30  --   O2SAT 61.0 41.9    CBC Recent Labs  Lab 08/22/18 1807 08/22/18 2129 08/23/18 0507  HGB 9.9* 9.7* 10.5*  HCT 30.2* 29.9* 32.1*  WBC 2.0* 1.8* 1.9*  PLT 78* 74* 78*    COAGULATION No results for input(s): INR in the last 168 hours.  CARDIAC   Recent Labs  Lab 08/22/18 1807 08/22/18 2130 08/23/18 0508  TROPONINI 0.56* 0.70* 0.52*   No results for input(s): PROBNP in the last 168 hours.   CHEMISTRY Recent Labs  Lab 08/20/18 1408 08/20/18 1612 08/21/18 1817 08/22/18 0523 08/23/18 0508  NA 139 135 135 147* 143  K 4.4 4.7 4.6 3.3* 3.0*  CL 98  --  102 117* 109  CO2 25  --  19* 23 26  GLUCOSE 850*  --  1,047* 130* 131*  BUN 42*  --  47* 31* 15  CREATININE 1.60* 0.90 1.33* 0.86 0.69  CALCIUM 9.5  --  7.9* 8.0* 7.6*  MG 2.3  --   --   --  2.0   Estimated Creatinine Clearance: 96.2 mL/min (by C-G formula based on SCr of 0.69 mg/dL).   LIVER Recent Labs  Lab 08/20/18 1603 08/21/18 1817 08/22/18 0523 08/23/18 0508  AST 18 120* 116* 73*  ALT 41 155* 203* 190*  ALKPHOS 131* 134* 94 83  BILITOT 0.4 0.8 0.5 1.0  PROT 6.8 5.5* 5.1* 4.6*  ALBUMIN 3.9 3.3* 3.0* 2.8*     INFECTIOUS Recent Labs  Lab 08/22/18 1807  LATICACIDVEN 1.1     ENDOCRINE CBG (last 3)  Recent Labs    08/22/18 2321 08/23/18 0514 08/23/18 0859  GLUCAP 112* 122* 114*         IMAGING x48h  - image(s) personally visualized  -   highlighted in bold Ct Angio Chest Pe W Or Wo Contrast  Addendum Date: 08/22/2018   ADDENDUM REPORT: 08/22/2018 15:55 ADDENDUM: Critical Value/emergent results were called by telephone at the time of interpretation on 08/22/2018 at 3:52 p.m. to Dr. Florencia Reasons , who verbally acknowledged these results. Electronically Signed   By: Marin Olp M.D.   On: 08/22/2018 15:55   Result Date: 08/22/2018 CLINICAL DATA:  Patient received treatment for head neck cancer. Hyperglycemic. Shortness of breath. Assess for pulmonary embolism. EXAM: CT  ANGIOGRAPHY CHEST WITH CONTRAST TECHNIQUE: Multidetector CT imaging of the chest was performed using the standard protocol during bolus administration of intravenous contrast. Multiplanar CT image reconstructions and MIPs were obtained to evaluate the vascular anatomy. CONTRAST:  70 mL OMNIPAQUE IOHEXOL 350 MG/ML SOLN COMPARISON:  Chest CT 07/07/2010 and PET-CT 04/29/2018 FINDINGS: Cardiovascular: Right IJ Port-A-Cath with tip over the cavoatrial junction. Heart is normal  size. Thoracic aorta is within normal. Pulmonary arteries are well opacified and demonstrate moderate burden of emboli over the bilateral main pulmonary arteries with small saddle embolus component and emboli extending into the proximal bilateral lobar arteries. Evidence of right heart strain with RV/LV ratio of 52.7/25.8 = 2.0. Remaining vascular structures are unremarkable. Mediastinum/Nodes: No evidence of mediastinal or hilar adenopathy. Remaining mediastinal structures are normal. Lungs/Pleura: Lungs are adequately inflated. There is a small right pleural effusion with minimal associated basilar atelectasis. Airways are normal. Upper Abdomen: No acute findings. Musculoskeletal: No acute findings. Review of the MIP images confirms the above findings. IMPRESSION: Moderate burden bilateral pulmonary emboli within the main pulmonary arteries with small saddle embolus component as emboli extends into the proximal bilateral lobar arteries. Right heart strain with LV ratio of 2.0. Positive for acute PE with CT evidence of right heart strain (RV/LV Ratio = 2.0) consistent with at least submassive (intermediate risk) PE. The presence of right heart strain has been associated with an increased risk of morbidity and mortality. Please activate Code PE by paging [phone]. Small right pleural effusion with minimal associated right basilar atelectasis. Electronically Signed: By: Marin Olp M.D. On: 08/22/2018 15:41   Ir Angiogram Pulmonary Bilateral  Selective  Result Date: 08/23/2018 INDICATION: History of head neck cancer, now with bilateral sub massive pulmonary embolism. Request made for placement of bilateral infusion catheters for the initiation pulmonary arterial thrombolysis EXAM: 1. ULTRASOUND GUIDANCE FOR VENOUS ACCESS X2 2. PULMONARY ARTERIOGRAPHY 3. FLUOROSCOPIC GUIDED PLACEMENT OF BILATERAL PULMONARY ARTERIAL LYTIC INFUSION CATHETERS COMPARISON:  Chest CTA-08/22/2018 MEDICATIONS: None ANESTHESIA/SEDATION: Moderate (conscious) sedation was employed during this procedure. A total of Versed 1 mg and Fentanyl 50 mcg was administered intravenously. Moderate Sedation Time: 40 minutes. The patient's level of consciousness and vital signs were monitored continuously by radiology nursing throughout the procedure under my direct supervision. CONTRAST:  10 mL OMNIPAQUE IOHEXOL 300 MG/ML  SOLN FLUOROSCOPY TIME:  3 minutes, 36 seconds (24 mGy) COMPLICATIONS: None immediate. TECHNIQUE: Informed written consent was obtained from the patient after a discussion of the risks, benefits and alternatives to treatment. Questions regarding the procedure were encouraged and answered. A timeout was performed prior to the initiation of the procedure. Ultrasound scanning was performed of the right groin and demonstrated wide patency of the right common femoral vein. As such, the right common femoral vein was selected venous access. The right groin was prepped and draped in the usual sterile fashion, and a sterile drape was applied covering the operative field. Maximum barrier sterile technique with sterile gowns and gloves were used for the procedure. A timeout was performed prior to the initiation of the procedure. Local anesthesia was provided with 1% lidocaine. Under direct ultrasound guidance, the right common femoral vein was accessed with a micro puncture kit ultimately allowing placement of a 6 French, 35 cm vascular sheath. Slightly caudal to this initial access, the  right common femoral was again accessed with an additional micropuncture kit ultimately allowing placement of an additional 6 French, 35 cm vascular sheath. Ultrasound images were saved for procedural documentation purposes. With the use of a glidewire, a vertebral catheter was advanced into the main pulmonary artery and a limited central pulmonary arteriogram was performed. Pressure measurements were attempted to be obtained from the level the main pulmonary artery however this ultimately proved unsuccessful secondary to measurement device malfunction. The vertebral catheter was advanced into the distal branch of the left lower lobe pulmonary artery. Limited contrast injection confirmed appropriate positioning.  Over an exchange length Rosen wire, the vertebral catheter was exchanged for a 105/12 cm multi side-hole EKOS ultrasound assisted infusion catheter. With the use of a glidewire, a vertebral catheter was advanced into a distal branch of the right lower lobe pulmonary artery. Limited contrast injection confirmed appropriate positioning. Over an exchange length Rosen wire, the pigtail catheter was exchanged for a 105/18 cm multi side-hole EKOS ultrasound assisted infusion catheter. A postprocedural fluoroscopic image was obtained to document final catheter positioning. The external catheter tubing was secured at the right thigh and the lytic therapy was initiated. The patient tolerated the procedure well without immediate postprocedural complication. FINDINGS: Limited central pulmonary arteriogram was performed to document appropriate catheter positioning. As above, pressure measurements unable to be obtained secondary to pressure measurement device malfunction. Following the procedure, both ultrasound assisted infusion catheter tips terminate within the distal aspects of the bilateral lower lobe sub segmental pulmonary arteries. IMPRESSION: Successful fluoroscopic guided initiation of bilateral ultrasound  assisted catheter directed pulmonary arterial lysis for sub massive pulmonary embolism and right-sided heart strain. PLAN: - Will proceed with 12 hour bilateral pulmonary arterial catheter directed thrombolysis. - Will discussed appropriateness of placement of an IVC filter in the setting of left lower extremity DVT with providing ICU attending prior to removal of infusion catheters. Electronically Signed   By: Sandi Mariscal M.D.   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 Request made for placement of bilateral infusion catheters for the initiation pulmonary arterial thrombolysis EXAM: 1. ULTRASOUND GUIDANCE FOR VENOUS ACCESS X2 2. PULMONARY ARTERIOGRAPHY 3. FLUOROSCOPIC GUIDED PLACEMENT OF BILATERAL PULMONARY ARTERIAL LYTIC INFUSION CATHETERS COMPARISON:  Chest CTA-08/22/2018 MEDICATIONS: None ANESTHESIA/SEDATION: Moderate (conscious) sedation was employed during this procedure. A total of Versed 1 mg and Fentanyl 50 mcg was administered intravenously. Moderate Sedation Time: 40 minutes. The patient's level of consciousness and vital signs were monitored continuously by radiology nursing throughout the procedure under my direct supervision. CONTRAST:  10 mL OMNIPAQUE IOHEXOL 300 MG/ML  SOLN FLUOROSCOPY TIME:  3 minutes, 36 seconds (24 mGy) COMPLICATIONS: None immediate. TECHNIQUE: Informed written consent was obtained from the patient after a discussion of the risks, benefits and alternatives to treatment. Questions regarding the procedure were encouraged and answered. A timeout was performed prior to the initiation of the procedure. Ultrasound scanning was performed of the right groin and demonstrated wide patency of the right common femoral vein. As such, the right common femoral vein was selected venous access. The right groin was prepped and draped in the usual  sterile fashion, and a sterile drape was applied covering the operative field. Maximum barrier sterile technique with sterile gowns and gloves were used for the procedure. A timeout was performed prior to the initiation of the procedure. Local anesthesia was provided with 1% lidocaine. Under direct ultrasound guidance, the right common femoral vein was accessed with a micro puncture kit ultimately allowing placement of a 6 French, 35 cm vascular sheath. Slightly caudal to this initial access, the right common femoral was again accessed with an additional micropuncture kit ultimately allowing placement of an additional 6 French, 35 cm vascular sheath. Ultrasound images were saved for procedural documentation purposes. With the use of a glidewire, a vertebral catheter was advanced into the main pulmonary artery and a limited central pulmonary arteriogram was performed. Pressure measurements were attempted to be obtained from the level the main pulmonary artery  however this ultimately proved unsuccessful secondary to measurement device malfunction. The vertebral catheter was advanced into the distal branch of the left lower lobe pulmonary artery. Limited contrast injection confirmed appropriate positioning. Over an exchange length Rosen wire, the vertebral catheter was exchanged for a 105/12 cm multi side-hole EKOS ultrasound assisted infusion catheter. With the use of a glidewire, a vertebral catheter was advanced into a distal branch of the right lower lobe pulmonary artery. Limited contrast injection confirmed appropriate positioning. Over an exchange length Rosen wire, the pigtail catheter was exchanged for a 105/18 cm multi side-hole EKOS ultrasound assisted infusion catheter. A postprocedural fluoroscopic image was obtained to document final catheter positioning. The external catheter tubing was secured at the right thigh and the lytic therapy was initiated. The patient tolerated the procedure well without  immediate postprocedural complication. FINDINGS: Limited central pulmonary arteriogram was performed to document appropriate catheter positioning. As above, pressure measurements unable to be obtained secondary to pressure measurement device malfunction. Following the procedure, both ultrasound assisted infusion catheter tips terminate within the distal aspects of the bilateral lower lobe sub segmental pulmonary arteries. IMPRESSION: Successful fluoroscopic guided initiation of bilateral ultrasound assisted catheter directed pulmonary arterial lysis for sub massive pulmonary embolism and right-sided heart strain. PLAN: - Will proceed with 12 hour bilateral pulmonary arterial catheter directed thrombolysis. - Will discussed appropriateness of placement of an IVC filter in the setting of left lower extremity DVT with providing ICU attending prior to removal of infusion catheters. Electronically Signed   By: Sandi Mariscal M.D.   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 Request made for placement of bilateral infusion catheters for the initiation pulmonary arterial thrombolysis EXAM: 1. ULTRASOUND GUIDANCE FOR VENOUS ACCESS X2 2. PULMONARY ARTERIOGRAPHY 3. FLUOROSCOPIC GUIDED PLACEMENT OF BILATERAL PULMONARY ARTERIAL LYTIC INFUSION CATHETERS COMPARISON:  Chest CTA-08/22/2018 MEDICATIONS: None ANESTHESIA/SEDATION: Moderate (conscious) sedation was employed during this procedure. A total of Versed 1 mg and Fentanyl 50 mcg was administered intravenously. Moderate Sedation Time: 40 minutes. The patient's level of consciousness and vital signs were monitored continuously by radiology nursing throughout the procedure under my direct supervision. CONTRAST:  10 mL OMNIPAQUE IOHEXOL 300 MG/ML  SOLN FLUOROSCOPY TIME:  3 minutes, 36 seconds (24 mGy) COMPLICATIONS: None immediate.  TECHNIQUE: Informed written consent was obtained from the patient after a discussion of the risks, benefits and alternatives to treatment. Questions regarding the procedure were encouraged and answered. A timeout was performed prior to the initiation of the procedure. Ultrasound scanning was performed of the right groin and demonstrated wide patency of the right common femoral vein. As such, the right common femoral vein was selected venous access. The right groin was prepped and draped in the usual sterile fashion, and a sterile drape was applied covering the operative field. Maximum barrier sterile technique with sterile gowns and gloves were used for the procedure. A timeout was performed prior to the initiation of the procedure. Local anesthesia was provided with 1% lidocaine. Under direct ultrasound guidance, the right common femoral vein was accessed with a micro puncture kit ultimately allowing placement of a 6 French, 35 cm vascular sheath. Slightly caudal to this initial access, the right common femoral was again accessed with an additional micropuncture kit ultimately allowing placement of an additional 6 French, 35 cm vascular sheath. Ultrasound images were saved for procedural documentation purposes. With the use of a glidewire,  a vertebral catheter was advanced into the main pulmonary artery and a limited central pulmonary arteriogram was performed. Pressure measurements were attempted to be obtained from the level the main pulmonary artery however this ultimately proved unsuccessful secondary to measurement device malfunction. The vertebral catheter was advanced into the distal branch of the left lower lobe pulmonary artery. Limited contrast injection confirmed appropriate positioning. Over an exchange length Rosen wire, the vertebral catheter was exchanged for a 105/12 cm multi side-hole EKOS ultrasound assisted infusion catheter. With the use of a glidewire, a vertebral catheter was advanced into a  distal branch of the right lower lobe pulmonary artery. Limited contrast injection confirmed appropriate positioning. Over an exchange length Rosen wire, the pigtail catheter was exchanged for a 105/18 cm multi side-hole EKOS ultrasound assisted infusion catheter. A postprocedural fluoroscopic image was obtained to document final catheter positioning. The external catheter tubing was secured at the right thigh and the lytic therapy was initiated. The patient tolerated the procedure well without immediate postprocedural complication. FINDINGS: Limited central pulmonary arteriogram was performed to document appropriate catheter positioning. As above, pressure measurements unable to be obtained secondary to pressure measurement device malfunction. Following the procedure, both ultrasound assisted infusion catheter tips terminate within the distal aspects of the bilateral lower lobe sub segmental pulmonary arteries. IMPRESSION: Successful fluoroscopic guided initiation of bilateral ultrasound assisted catheter directed pulmonary arterial lysis for sub massive pulmonary embolism and right-sided heart strain. PLAN: - Will proceed with 12 hour bilateral pulmonary arterial catheter directed thrombolysis. - Will discussed appropriateness of placement of an IVC filter in the setting of left lower extremity DVT with providing ICU attending prior to removal of infusion catheters. Electronically Signed   By: Sandi Mariscal M.D.   On: 08/23/2018 08:38   US Abdomen Limited  Result Date: 08/22/2018 CLINICAL DATA:  Elevated LFTs EXAM: ULTRASOUND ABDOMEN LIMITED RIGHT UPPER QUADRANT COMPARISON:  None. FINDINGS: Gallbladder: No visible gallstones. Gallbladder wall is mildly thickened at 6 mm. Negative sonographic Murphy's. Common bile duct: Diameter: Normal caliber, 3 mm Liver: Increased echotexture compatible with fatty infiltration. No focal abnormality or biliary ductal dilatation. Portal vein is patent on color Doppler imaging  with normal direction of blood flow towards the liver. IMPRESSION: Fatty infiltration of the liver. Gallbladder wall thickening without visible stones or sonographic Murphy sign. This may be related to liver disease or chronic cholecystitis. Electronically Signed   By: Rolm Baptise M.D.   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 Request made for placement of bilateral infusion catheters for the initiation pulmonary arterial thrombolysis EXAM: 1. ULTRASOUND GUIDANCE FOR VENOUS ACCESS X2 2. PULMONARY ARTERIOGRAPHY 3. FLUOROSCOPIC GUIDED PLACEMENT OF BILATERAL PULMONARY ARTERIAL LYTIC INFUSION CATHETERS COMPARISON:  Chest CTA-08/22/2018 MEDICATIONS: None ANESTHESIA/SEDATION: Moderate (conscious) sedation was employed during this procedure. A total of Versed 1 mg and Fentanyl 50 mcg was administered intravenously. Moderate Sedation Time: 40 minutes. The patient's level of consciousness and vital signs were monitored continuously by radiology nursing throughout the procedure under my direct supervision. CONTRAST:  10 mL OMNIPAQUE IOHEXOL 300 MG/ML  SOLN FLUOROSCOPY TIME:  3 minutes, 36 seconds (24 mGy) COMPLICATIONS: None immediate. TECHNIQUE: Informed written consent was obtained from the patient after a discussion of the risks, benefits and alternatives to treatment. Questions regarding the procedure were encouraged and answered. A timeout was performed prior to the initiation of the procedure. Ultrasound scanning was  performed of the right groin and demonstrated wide patency of the right common femoral vein. As such, the right common femoral vein was selected venous access. The right groin was prepped and draped in the usual sterile fashion, and a sterile drape was applied covering the operative field. Maximum barrier sterile technique with sterile gowns and gloves were used for the  procedure. A timeout was performed prior to the initiation of the procedure. Local anesthesia was provided with 1% lidocaine. Under direct ultrasound guidance, the right common femoral vein was accessed with a micro puncture kit ultimately allowing placement of a 6 French, 35 cm vascular sheath. Slightly caudal to this initial access, the right common femoral was again accessed with an additional micropuncture kit ultimately allowing placement of an additional 6 French, 35 cm vascular sheath. Ultrasound images were saved for procedural documentation purposes. With the use of a glidewire, a vertebral catheter was advanced into the main pulmonary artery and a limited central pulmonary arteriogram was performed. Pressure measurements were attempted to be obtained from the level the main pulmonary artery however this ultimately proved unsuccessful secondary to measurement device malfunction. The vertebral catheter was advanced into the distal branch of the left lower lobe pulmonary artery. Limited contrast injection confirmed appropriate positioning. Over an exchange length Rosen wire, the vertebral catheter was exchanged for a 105/12 cm multi side-hole EKOS ultrasound assisted infusion catheter. With the use of a glidewire, a vertebral catheter was advanced into a distal branch of the right lower lobe pulmonary artery. Limited contrast injection confirmed appropriate positioning. Over an exchange length Rosen wire, the pigtail catheter was exchanged for a 105/18 cm multi side-hole EKOS ultrasound assisted infusion catheter. A postprocedural fluoroscopic image was obtained to document final catheter positioning. The external catheter tubing was secured at the right thigh and the lytic therapy was initiated. The patient tolerated the procedure well without immediate postprocedural complication. FINDINGS: Limited central pulmonary arteriogram was performed to document appropriate catheter positioning. As above, pressure  measurements unable to be obtained secondary to pressure measurement device malfunction. Following the procedure, both ultrasound assisted infusion catheter tips terminate within the distal aspects of the bilateral lower lobe sub segmental pulmonary arteries. IMPRESSION: Successful fluoroscopic guided initiation of bilateral ultrasound assisted catheter directed pulmonary arterial lysis for sub massive pulmonary embolism and right-sided heart strain. PLAN: - Will proceed with 12 hour bilateral pulmonary arterial catheter directed thrombolysis. - Will discussed appropriateness of placement of an IVC filter in the setting of left lower extremity DVT with providing ICU attending prior to removal of infusion catheters. Electronically Signed   By: Sandi Mariscal M.D.   On: 08/23/2018 08:38   Dg Chest Port 1 View  Result Date: 08/23/2018 CLINICAL DATA:  Post initiation of bilateral pulmonary arterial catheter directed thrombolysis. Evaluate pulmonary infusion catheter positioning. EXAM: PORTABLE CHEST 1 VIEW COMPARISON:  Chest radiograph - 08/21/2018; initiation of bilateral pulmonary arterial catheter directed thrombolysis - 08/22/2018; chest CT - 08/22/2018 FINDINGS: Grossly unchanged cardiac silhouette and mediastinal contours. Unchanged positioning of bilateral pulmonary arterial infusion catheters with tips overlying the expected location of the bilateral lower lobe subsegmental pulmonary arteries the right approximately 15 cm from the expected location of the main pulmonary artery and the left approximately 17 cm. Stable positioning of right jugular approach port a catheter with tip projected over the superior cavoatrial junction. No focal airspace opacities. No pleural effusion or pneumothorax no evidence of edema. No acute osseous abnormalities. IMPRESSION: Unchanged positioning of bilateral pulmonary arterial lytic infusion catheters.  Electronically Signed   By: Sandi Mariscal M.D.   On: 08/23/2018 09:00   Dg  Chest Port 1 View  Result Date: 08/21/2018 CLINICAL DATA:  Head neck cancer. Hyperglycemia. EXAM: PORTABLE CHEST 1 VIEW COMPARISON:  None. FINDINGS: The heart size and mediastinal contours are within normal limits. Both lungs are clear. The visualized skeletal structures are unremarkable. Port-A-Cath tip proximal RIGHT atrium. IMPRESSION: No active disease. Electronically Signed   By: Staci Righter M.D.   On: 08/21/2018 18:42   Vas [place] Lower Extremity Venous (dvt)  Result Date: 08/22/2018  Lower Venous Study Indications: Pulmonary embolism.  Performing Technologist: Oliver Hum RVT  Examination Guidelines: A complete evaluation includes B-mode imaging, spectral Doppler, color Doppler, and power Doppler as needed of all accessible portions of each vessel. Bilateral testing is considered an integral part of a complete examination. Limited examinations for reoccurring indications may be performed as noted.  Right Venous Findings: +---------+---------------+---------+-----------+----------+-------+            Compressibility Phasicity Spontaneity Properties Summary  +---------+---------------+---------+-----------+----------+-------+  CFV       Full            Yes       Yes                             +---------+---------------+---------+-----------+----------+-------+  SFJ       Full                                                      +---------+---------------+---------+-----------+----------+-------+  FV Prox   Full                                                      +---------+---------------+---------+-----------+----------+-------+  FV Mid    Full                                                      +---------+---------------+---------+-----------+----------+-------+  FV Distal Full                                                      +---------+---------------+---------+-----------+----------+-------+  PFV       Full                                                       +---------+---------------+---------+-----------+----------+-------+  POP       Full            Yes       Yes                             +---------+---------------+---------+-----------+----------+-------+  PTV       Full                                                      +---------+---------------+---------+-----------+----------+-------+  PERO      Full                                                      +---------+---------------+---------+-----------+----------+-------+  Left Venous Findings: +---------+---------------+---------+-----------+----------+-------+            Compressibility Phasicity Spontaneity Properties Summary  +---------+---------------+---------+-----------+----------+-------+  CFV       Full            No        No                              +---------+---------------+---------+-----------+----------+-------+  SFJ       Full                                                      +---------+---------------+---------+-----------+----------+-------+  FV Prox   Full                                                      +---------+---------------+---------+-----------+----------+-------+  FV Mid    Full                                                      +---------+---------------+---------+-----------+----------+-------+  FV Distal Full                                                      +---------+---------------+---------+-----------+----------+-------+  PFV       Full                                                      +---------+---------------+---------+-----------+----------+-------+  POP       Partial         No        No                     Acute    +---------+---------------+---------+-----------+----------+-------+  PTV       Partial  Acute    +---------+---------------+---------+-----------+----------+-------+  PERO      None                                             Acute     +---------+---------------+---------+-----------+----------+-------+  Gastroc   Full                                                      +---------+---------------+---------+-----------+----------+-------+    Summary: Right: There is no evidence of deep vein thrombosis in the lower extremity. Left: Findings consistent with acute deep vein thrombosis involving the left popliteal vein, left posterior tibial vein, and left peroneal vein. No cystic structure found in the popliteal fossa.  *See table(s) above for measurements and observations.    Preliminary    Ir Infusion Thrombol Arterial Initial (ms)  Result Date: 08/23/2018 INDICATION: History of head neck cancer, now with bilateral sub massive pulmonary embolism. Request made for placement of bilateral infusion catheters for the initiation pulmonary arterial thrombolysis EXAM: 1. ULTRASOUND GUIDANCE FOR VENOUS ACCESS X2 2. PULMONARY ARTERIOGRAPHY 3. FLUOROSCOPIC GUIDED PLACEMENT OF BILATERAL PULMONARY ARTERIAL LYTIC INFUSION CATHETERS COMPARISON:  Chest CTA-08/22/2018 MEDICATIONS: None ANESTHESIA/SEDATION: Moderate (conscious) sedation was employed during this procedure. A total of Versed 1 mg and Fentanyl 50 mcg was administered intravenously. Moderate Sedation Time: 40 minutes. The patient's level of consciousness and vital signs were monitored continuously by radiology nursing throughout the procedure under my direct supervision. CONTRAST:  10 mL OMNIPAQUE IOHEXOL 300 MG/ML  SOLN FLUOROSCOPY TIME:  3 minutes, 36 seconds (24 mGy) COMPLICATIONS: None immediate. TECHNIQUE: Informed written consent was obtained from the patient after a discussion of the risks, benefits and alternatives to treatment. Questions regarding the procedure were encouraged and answered. A timeout was performed prior to the initiation of the procedure. Ultrasound scanning was performed of the right groin and demonstrated wide patency of the right common femoral vein. As such, the right  common femoral vein was selected venous access. The right groin was prepped and draped in the usual sterile fashion, and a sterile drape was applied covering the operative field. Maximum barrier sterile technique with sterile gowns and gloves were used for the procedure. A timeout was performed prior to the initiation of the procedure. Local anesthesia was provided with 1% lidocaine. Under direct ultrasound guidance, the right common femoral vein was accessed with a micro puncture kit ultimately allowing placement of a 6 French, 35 cm vascular sheath. Slightly caudal to this initial access, the right common femoral was again accessed with an additional micropuncture kit ultimately allowing placement of an additional 6 French, 35 cm vascular sheath. Ultrasound images were saved for procedural documentation purposes. With the use of a glidewire, a vertebral catheter was advanced into the main pulmonary artery and a limited central pulmonary arteriogram was performed. Pressure measurements were attempted to be obtained from the level the main pulmonary artery however this ultimately proved unsuccessful secondary to measurement device malfunction. The vertebral catheter was advanced into the distal branch of the left lower lobe pulmonary artery. Limited contrast injection confirmed appropriate positioning. Over an exchange length Rosen wire, the vertebral catheter was exchanged for a 105/12 cm multi side-hole EKOS ultrasound assisted infusion catheter. With the  use of a glidewire, a vertebral catheter was advanced into a distal branch of the right lower lobe pulmonary artery. Limited contrast injection confirmed appropriate positioning. Over an exchange length Rosen wire, the pigtail catheter was exchanged for a 105/18 cm multi side-hole EKOS ultrasound assisted infusion catheter. A postprocedural fluoroscopic image was obtained to document final catheter positioning. The external catheter tubing was secured at the  right thigh and the lytic therapy was initiated. The patient tolerated the procedure well without immediate postprocedural complication. FINDINGS: Limited central pulmonary arteriogram was performed to document appropriate catheter positioning. As above, pressure measurements unable to be obtained secondary to pressure measurement device malfunction. Following the procedure, both ultrasound assisted infusion catheter tips terminate within the distal aspects of the bilateral lower lobe sub segmental pulmonary arteries. IMPRESSION: Successful fluoroscopic guided initiation of bilateral ultrasound assisted catheter directed pulmonary arterial lysis for sub massive pulmonary embolism and right-sided heart strain. PLAN: - Will proceed with 12 hour bilateral pulmonary arterial catheter directed thrombolysis. - Will discussed appropriateness of placement of an IVC filter in the setting of left lower extremity DVT with providing ICU attending prior to removal of infusion catheters. Electronically Signed   By: Sandi Mariscal M.D.   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 Request made for placement of bilateral infusion catheters for the initiation pulmonary arterial thrombolysis EXAM: 1. ULTRASOUND GUIDANCE FOR VENOUS ACCESS X2 2. PULMONARY ARTERIOGRAPHY 3. FLUOROSCOPIC GUIDED PLACEMENT OF BILATERAL PULMONARY ARTERIAL LYTIC INFUSION CATHETERS COMPARISON:  Chest CTA-08/22/2018 MEDICATIONS: None ANESTHESIA/SEDATION: Moderate (conscious) sedation was employed during this procedure. A total of Versed 1 mg and Fentanyl 50 mcg was administered intravenously. Moderate Sedation Time: 40 minutes. The patient's level of consciousness and vital signs were monitored continuously by radiology nursing throughout the procedure under my direct supervision. CONTRAST:  10 mL OMNIPAQUE IOHEXOL 300 MG/ML   SOLN FLUOROSCOPY TIME:  3 minutes, 36 seconds (24 mGy) COMPLICATIONS: None immediate. TECHNIQUE: Informed written consent was obtained from the patient after a discussion of the risks, benefits and alternatives to treatment. Questions regarding the procedure were encouraged and answered. A timeout was performed prior to the initiation of the procedure. Ultrasound scanning was performed of the right groin and demonstrated wide patency of the right common femoral vein. As such, the right common femoral vein was selected venous access. The right groin was prepped and draped in the usual sterile fashion, and a sterile drape was applied covering the operative field. Maximum barrier sterile technique with sterile gowns and gloves were used for the procedure. A timeout was performed prior to the initiation of the procedure. Local anesthesia was provided with 1% lidocaine. Under direct ultrasound guidance, the right common femoral vein was accessed with a micro puncture kit ultimately allowing placement of a 6 French, 35 cm vascular sheath. Slightly caudal to this initial access, the right common femoral was again accessed with an additional micropuncture kit ultimately allowing placement of an additional 6 French, 35 cm vascular sheath. Ultrasound images were saved for procedural documentation purposes. With the use of a glidewire, a vertebral catheter was advanced into the main pulmonary artery and a limited central pulmonary arteriogram was performed. Pressure measurements were attempted to be obtained from the level the main pulmonary artery however this ultimately proved unsuccessful secondary to measurement device malfunction. The vertebral catheter was advanced into the distal branch of the left lower lobe  pulmonary artery. Limited contrast injection confirmed appropriate positioning. Over an exchange length Rosen wire, the vertebral catheter was exchanged for a 105/12 cm multi side-hole EKOS ultrasound assisted  infusion catheter. With the use of a glidewire, a vertebral catheter was advanced into a distal branch of the right lower lobe pulmonary artery. Limited contrast injection confirmed appropriate positioning. Over an exchange length Rosen wire, the pigtail catheter was exchanged for a 105/18 cm multi side-hole EKOS ultrasound assisted infusion catheter. A postprocedural fluoroscopic image was obtained to document final catheter positioning. The external catheter tubing was secured at the right thigh and the lytic therapy was initiated. The patient tolerated the procedure well without immediate postprocedural complication. FINDINGS: Limited central pulmonary arteriogram was performed to document appropriate catheter positioning. As above, pressure measurements unable to be obtained secondary to pressure measurement device malfunction. Following the procedure, both ultrasound assisted infusion catheter tips terminate within the distal aspects of the bilateral lower lobe sub segmental pulmonary arteries. IMPRESSION: Successful fluoroscopic guided initiation of bilateral ultrasound assisted catheter directed pulmonary arterial lysis for sub massive pulmonary embolism and right-sided heart strain. PLAN: - Will proceed with 12 hour bilateral pulmonary arterial catheter directed thrombolysis. - Will discussed appropriateness of placement of an IVC filter in the setting of left lower extremity DVT with providing ICU attending prior to removal of infusion catheters. Electronically Signed   By: Sandi Mariscal M.D.   On: 08/23/2018 08:38   s.  Assessment & Plan:   Submassive PE - extensive clot burden bilaterally.  Complicated by syncope prior to admit and with hypoxia and increasing O2 requirements. PESI Score 139/Class 5 - high risk , sp EKOS 08/22/2018  4/4 - improved. Off o2. HR < 105. Having some epistaxis but resolved. Off EKOS  PLAN - Continue IV heparin - Will need lifelong anticoagulation with lovenox vs DOAC. -  Oncology notified (Dr. Marin Olp on call at time of this consult).08/22/2018    Acute hypoxic respiratory failure - due to above. - off o2 08/23/2018 - goal pulse ox > 88%   DM - Likely due to / exacerbated by decadron in conjunction with chemo.  Now improved after insulin gtt.   4/4  - off insuling gtt  PLAN - Continue [facility] insulin. - Will need anti-hyperglycemic regimen upon discharge (recently started on metformin).  AKI - anticipate worsening after dye load.  4/4- resolved  PLAN - Continue IVF's. - F/u AM labs.   Hypokalemia  4/4 - repleted  Plan  monitor  Transaminitis - unclear etiology. ? Hepatic congestion in setting impaired RV function due to PE.  Abd [place] negative for acute process, possible chronic cholecystitis. - Trend LFT's.  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 - F/u with oncology.  Best Practice:  Diet: Carb Mod. Pain/Anxiety/Delirium protocol (if indicated): N/A. VAP protocol (if indicated): N/A. DVT prophylaxis: Heparin gtt. GI prophylaxis: N/A. Glucose control: SSI. Mobility: Bedrest. Code Status: Full. Family Communication: Wife updated by me over the phone by APP 4./07/2018. By MD 4./44/2020 Disposition: ICU - can go to tele later 08/23/2018  - d/w D Xu - she wil take over 08/24/18      SIGNATURE    Dr. Brand Males, M.D., F.C.C.P,  Pulmonary and Critical Care Medicine Staff Physician, [facility] Director - Interstitial Lung Disease  Program  [facility] [place], [place], [address]  Pager: [phone], If no answer or between  15:00h -  7:00h: call [phone] Telephone: [phone]  10:17 AM 08/23/2018

## 2018-08-23 NOTE — Progress Notes (Signed)
ANTICOAGULATION CONSULT NOTE  Pharmacy Consult:  Heparin Indication: pulmonary embolus  No Known Allergies  Patient Measurements: Height: 5\' 8"  (172.7 cm) Weight: 168 lb 14 oz (76.6 kg) IBW/kg (Calculated) : 68.4  Vital Signs: Temp: 97.3 F (36.3 C) (04/04 1617) Temp Source: Oral (04/04 1617) BP: 123/101 (04/04 1617) Pulse Rate: 106 (04/04 1617)  Labs: Recent Labs    08/21/18 1817  08/22/18 0523  08/22/18 1807 08/22/18 2129 08/22/18 2130  08/23/18 0506 08/23/18 0507 08/23/18 0508 08/23/18 1044 08/23/18 1734  HGB 11.8*   < >  --   --  9.9* 9.7*  --   --   --  10.5*  --  10.9*  --   HCT 37.2*   < >  --   --  30.2* 29.9*  --   --   --  32.1*  --  33.1*  --   PLT 105*   < >  --   --  78* 74*  --   --   --  78*  --  83*  --   HEPARINUNFRC  --   --   --    < > 0.25* 0.90*  --    < > 0.91*  --   --  0.65 0.34  CREATININE 1.33*  --  0.86  --   --   --   --   --   --   --  0.69  --   --   TROPONINI  --   --   --   --  0.56*  --  0.70*  --   --   --  0.52*  --   --    < > = values in this interval not displayed.    Estimated Creatinine Clearance: 96.2 mL/min (by C-G formula based on SCr of 0.69 mg/dL).   Assessment: 58 y/o male SCC of left tonsil on chemotherapy who presented to the ED with near syncope and hyperglycemia. CTA chest shows bilateral PE with right heart strain. LLE duplex noted for DVT. Pharmacy consulted to dose IV heparin.  He is s/p EKOS.  Confirmatory heparin level is therapeutic and has decreased.  No issue with heparin infusion per RN.  No bleeding reported.  Goal of Therapy:  Heparin level 0.3-0.7 units/ml Monitor platelets by anticoagulation protocol: Yes   Plan:  Increase heparin gtt to 950 units/hr F/U AM labs  Maurice Ramseur D. Mina Marble, PharmD, BCPS, [place] 08/23/2018, 7:15 PM

## 2018-08-23 NOTE — Progress Notes (Signed)
PROGRESS NOTE  Cevin Rubinstein [facility] KKX:[id number] DOB: 01-Aug-1960 DOA: 08/21/2018 PCP: [name], MD  HPI/Recap of past 24 hours:   S/p catheter directed thrombolysis yesterday afternoon Hemodynamically stable  Assessment/Plan: Principal Problem:   Hyperglycemia Active Problems:   Squamous cell carcinoma of left tonsil (HCC)   Sensorineural hearing loss (SNHL) of both ears   Oral candidiasis   AKI (acute kidney injury) ([place])   Leukopenia due to antineoplastic chemotherapy (HCC)   Chemotherapy-induced thrombocytopenia   Antineoplastic chemotherapy induced anemia   Dehydration   LFT elevation   Mucositis   Pancytopenia (HCC)   Hypokalemia  Hyperglycemia: with mild DKA on presentation Blood glucose 1047 on presentation. Gap closed ( from 16 to 7), blood glucose much improved after insulin drip and ivf He is transitioned to subQ insulin, continue hydration Possible cause of hyperglycemia, including tube feeds , recent steroids use, and possible underline diabetes Change tube feeds from osmolites to glucerna, nutrition input appreciated, continue hydration Will need glucometer, diabetic testing supplies and insulin prescription at discharge  Acute bilateral PE/Bilateral DVT -patient had Near syncope/Acute hypoxic respiratory failure while at the cancer center, rapid response was called, he was brought to the ED -he is started on heparin drip, code PE activated, he underwent catheter directed thrombolysis on 4/3 --critical care and IR input appreciated   Hypokalemia:remain low, continue to replace k, repeat lab in am, also check mag  AKI; Likely prerenal  Cr 1.33 on presentation, cr normalized today is 0.8  Elevated lft:  he denies ab pain, no n/v Will get ab [place] and hepatitis panel, repeat lab in am   Pancytopenia:  possible chemo related (anemia, thrombocytopenia, leukopenia) Monitor, hematology/oncology input appreciated  Mucositis: magic mouth wash  Stage I  (pT1pN1M0) left tonsil SCCa, p16+ Plan per oncology Dr Maylon Peppers: -S/p TORS in 05/2018, followed by adjuvant chemoradiation  -Chemoradiation completed on 08/21/2018 -No further treatment planned at this time -We will plan to repeat surveillance scans in 2-3 months for assess for any evidence of residual disease    Code Status: full  Family Communication: patient and wife over the phone  Disposition Plan: not ready to discharge Patient today is under critical care service, will be back on my service tomorrow  Consultants:  Hematology/oncology  IR  Pulmonary /critical care  Procedures:  he underwent catheter directed thrombolysis on 4/3  Antibiotics:  none   Objective: BP 118/87   Pulse (!) 102   Temp (!) 97.5 F (36.4 C) (Oral)   Resp 15   Ht 5' 8" (1.727 m)   Wt 77.4 kg   SpO2 98%   BMI 25.94 kg/m   Intake/Output Summary (Last 24 hours) at 08/23/2018 0738 Last data filed at 08/22/2018 1041 Gross per 24 hour  Intake 10 ml  Output -  Net 10 ml   Filed Weights   08/21/18 1737 08/22/18 0008  Weight: 74.8 kg 77.4 kg    Exam: Patient is examined daily including today on 08/23/2018, exams remain the same as of yesterday except that has changed    General:  NAD  Cardiovascular: RRR  Respiratory: CTABL  Abdomen: peg tube in place, Soft/ND/NT, positive BS  Musculoskeletal: No Edema  Neuro: alert, oriented   Data Reviewed: Basic Metabolic Panel: Recent Labs  Lab 08/20/18 1408 08/20/18 1612 08/21/18 1817 08/22/18 0523 08/23/18 0508  NA 139 135 135 147* 143  K 4.4 4.7 4.6 3.3* 3.0*  CL 98  --  102 117* 109  CO2 25  --  19* 23 26  GLUCOSE 850*  --  1,047* 130* 131*  BUN 42*  --  47* 31* 15  CREATININE 1.60* 0.90 1.33* 0.86 0.69  CALCIUM 9.5  --  7.9* 8.0* 7.6*  MG 2.3  --   --   --  2.0   Liver Function Tests: Recent Labs  Lab 08/20/18 1603 08/21/18 1817 08/22/18 0523 08/23/18 0508  AST 18 120* 116* 73*  ALT 41 155* 203* 190*  ALKPHOS 131* 134*  94 83  BILITOT 0.4 0.8 0.5 1.0  PROT 6.8 5.5* 5.1* 4.6*  ALBUMIN 3.9 3.3* 3.0* 2.8*   Recent Labs  Lab 08/23/18 0508  LIPASE 17   No results for input(s): AMMONIA in the last 168 hours. CBC: Recent Labs  Lab 08/20/18 1408  08/20/18 1602  08/21/18 1817 08/22/18 0520 08/22/18 1807 08/22/18 2129 08/23/18 0507  WBC 2.4*   < > 2.1*  --  3.4* 2.5* 2.0* 1.8* 1.9*  NEUTROABS 2.0  --  1.7  --  3.1  --   --   --   --   HGB 12.8*  --  12.4*   < > 11.8* 10.7* 9.9* 9.7* 10.5*  HCT 38.8*  --  37.8*   < > 37.2* 33.1* 30.2* 29.9* 32.1*  MCV 98.0  --  98.7  --  103.6* 96.8 95.3 97.7 98.5  PLT 129*   < > 126*  --  105* 90* 78* 74* 78*   < > = values in this interval not displayed.   Cardiac Enzymes:   Recent Labs  Lab 08/22/18 1807 08/22/18 2130 08/23/18 0508  TROPONINI 0.56* 0.70* 0.52*   BNP (last 3 results) Recent Labs    08/22/18 1807  BNP 457.1*    ProBNP (last 3 results) No results for input(s): PROBNP in the last 8760 hours.  CBG: Recent Labs  Lab 08/22/18 1204 08/22/18 1632 08/22/18 2136 08/22/18 2321 08/23/18 0514  GLUCAP 159* 145* 125* 112* 122*    No results found for this or any previous visit (from the past 240 hour(s)).   Studies: Ct Angio Chest Pe W Or Wo Contrast  Addendum Date: 08/22/2018   ADDENDUM REPORT: 08/22/2018 15:55 ADDENDUM: Critical Value/emergent results were called by telephone at the time of interpretation on 08/22/2018 at 3:52 p.m. to Dr. Florencia Reasons , who verbally acknowledged these results. Electronically Signed   By: Marin Olp M.D.   On: 08/22/2018 15:55   Result Date: 08/22/2018 CLINICAL DATA:  Patient received treatment for head neck cancer. Hyperglycemic. Shortness of breath. Assess for pulmonary embolism. EXAM: CT ANGIOGRAPHY CHEST WITH CONTRAST TECHNIQUE: Multidetector CT imaging of the chest was performed using the standard protocol during bolus administration of intravenous contrast. Multiplanar CT image reconstructions and MIPs were  obtained to evaluate the vascular anatomy. CONTRAST:  70 mL OMNIPAQUE IOHEXOL 350 MG/ML SOLN COMPARISON:  Chest CT 07/07/2010 and PET-CT 04/29/2018 FINDINGS: Cardiovascular: Right IJ Port-A-Cath with tip over the cavoatrial junction. Heart is normal size. Thoracic aorta is within normal. Pulmonary arteries are well opacified and demonstrate moderate burden of emboli over the bilateral main pulmonary arteries with small saddle embolus component and emboli extending into the proximal bilateral lobar arteries. Evidence of right heart strain with RV/LV ratio of 52.7/25.8 = 2.0. Remaining vascular structures are unremarkable. Mediastinum/Nodes: No evidence of mediastinal or hilar adenopathy. Remaining mediastinal structures are normal. Lungs/Pleura: Lungs are adequately inflated. There is a small right pleural effusion with minimal associated basilar atelectasis. Airways are normal. Upper Abdomen:  No acute findings. Musculoskeletal: No acute findings. Review of the MIP images confirms the above findings. IMPRESSION: Moderate burden bilateral pulmonary emboli within the main pulmonary arteries with small saddle embolus component as emboli extends into the proximal bilateral lobar arteries. Right heart strain with LV ratio of 2.0. Positive for acute PE with CT evidence of right heart strain (RV/LV Ratio = 2.0) consistent with at least submassive (intermediate risk) PE. The presence of right heart strain has been associated with an increased risk of morbidity and mortality. Please activate Code PE by paging [phone]. Small right pleural effusion with minimal associated right basilar atelectasis. Electronically Signed: By: Marin Olp M.D. On: 08/22/2018 15:41   US Abdomen Limited  Result Date: 08/22/2018 CLINICAL DATA:  Elevated LFTs EXAM: ULTRASOUND ABDOMEN LIMITED RIGHT UPPER QUADRANT COMPARISON:  None. FINDINGS: Gallbladder: No visible gallstones. Gallbladder wall is mildly thickened at 6 mm. Negative  sonographic Murphy's. Common bile duct: Diameter: Normal caliber, 3 mm Liver: Increased echotexture compatible with fatty infiltration. No focal abnormality or biliary ductal dilatation. Portal vein is patent on color Doppler imaging with normal direction of blood flow towards the liver. IMPRESSION: Fatty infiltration of the liver. Gallbladder wall thickening without visible stones or sonographic Murphy sign. This may be related to liver disease or chronic cholecystitis. Electronically Signed   By: Rolm Baptise M.D.   On: 08/22/2018 13:29   Vas [place] Lower Extremity Venous (dvt)  Result Date: 08/22/2018  Lower Venous Study Indications: Pulmonary embolism.  Performing Technologist: Oliver Hum RVT  Examination Guidelines: A complete evaluation includes B-mode imaging, spectral Doppler, color Doppler, and power Doppler as needed of all accessible portions of each vessel. Bilateral testing is considered an integral part of a complete examination. Limited examinations for reoccurring indications may be performed as noted.  Right Venous Findings: +---------+---------------+---------+-----------+----------+-------+          CompressibilityPhasicitySpontaneityPropertiesSummary +---------+---------------+---------+-----------+----------+-------+ CFV      Full           Yes      Yes                          +---------+---------------+---------+-----------+----------+-------+ SFJ      Full                                                 +---------+---------------+---------+-----------+----------+-------+ FV Prox  Full                                                 +---------+---------------+---------+-----------+----------+-------+ FV Mid   Full                                                 +---------+---------------+---------+-----------+----------+-------+ FV DistalFull                                                  +---------+---------------+---------+-----------+----------+-------+ PFV      Full                                                 +---------+---------------+---------+-----------+----------+-------+  POP      Full           Yes      Yes                          +---------+---------------+---------+-----------+----------+-------+ PTV      Full                                                 +---------+---------------+---------+-----------+----------+-------+ PERO     Full                                                 +---------+---------------+---------+-----------+----------+-------+  Left Venous Findings: +---------+---------------+---------+-----------+----------+-------+          CompressibilityPhasicitySpontaneityPropertiesSummary +---------+---------------+---------+-----------+----------+-------+ CFV      Full           No       No                           +---------+---------------+---------+-----------+----------+-------+ SFJ      Full                                                 +---------+---------------+---------+-----------+----------+-------+ FV Prox  Full                                                 +---------+---------------+---------+-----------+----------+-------+ FV Mid   Full                                                 +---------+---------------+---------+-----------+----------+-------+ FV DistalFull                                                 +---------+---------------+---------+-----------+----------+-------+ PFV      Full                                                 +---------+---------------+---------+-----------+----------+-------+ POP      Partial        No       No                   Acute   +---------+---------------+---------+-----------+----------+-------+ PTV      Partial                                      Acute    +---------+---------------+---------+-----------+----------+-------+ PERO     None  Acute   +---------+---------------+---------+-----------+----------+-------+ Gastroc  Full                                                 +---------+---------------+---------+-----------+----------+-------+    Summary: Right: There is no evidence of deep vein thrombosis in the lower extremity. Left: Findings consistent with acute deep vein thrombosis involving the left popliteal vein, left posterior tibial vein, and left peroneal vein. No cystic structure found in the popliteal fossa.  *See table(s) above for measurements and observations.    Preliminary     Scheduled Meds: . clotrimazole  10 mg Oral 5 X Daily  . docusate  100 mg Per Tube BID  . feeding supplement (GLUCERNA 1.2 CAL)  237 mL Per Tube 5 X Daily  . feeding supplement (PRO-STAT SUGAR FREE 64)  30 mL Oral TID  . fluconazole  100 mg Per Tube Daily  . insulin aspart  0-9 Units Subcutaneous Q4H  . insulin glargine  12 Units Subcutaneous QHS  . insulin starter kit- pen needles  1 kit Other Once  . mouth rinse  15 mL Mouth Rinse BID  . potassium chloride  40 mEq Oral Q4H  . pramipexole  1 mg Per Tube QHS  . sodium chloride flush  10-40 mL Intracatheter Q12H  . sodium chloride flush  3 mL Intravenous Q12H  . sodium chloride flush  3 mL Intravenous Q12H    Continuous Infusions: . sodium chloride 1,000 mL (08/21/18 1931)  . sodium chloride    . sodium chloride    . sodium chloride    . sodium chloride    . sodium chloride    . sodium chloride    . sodium chloride 35 mL/hr at 08/22/18 2045  . sodium chloride 35 mL/hr at 08/22/18 2045  . sodium chloride    . sodium chloride    . alteplase (tPA/ ACTIVASE) PE Lysis 12 mg/250 mL (BILATERAL)    . alteplase (tPA/ ACTIVASE) PE Lysis 12 mg/250 mL (BILATERAL)    . heparin 900 Units/hr (08/23/18 0636)     Time spent: 10mns I have personally  reviewed and interpreted on  08/23/2018 daily labs, tele strips, imagings as discussed above under date review session and assessment and plans.  I reviewed all nursing notes, pharmacy notes, consultant notes,  vitals, pertinent old records  I have discussed plan of care as described above with RN , patient and family on 08/23/2018   FFlorencia ReasonsMD, PhD  [facility] Pager [phone] If 7PM-7AM, please contact night-coverage at [email or web address], password TM[facility] Inc4/08/2018, 7:38 AM  LOS: 2 days

## 2018-08-24 DIAGNOSIS — I82403 Acute embolism and thrombosis of unspecified deep veins of lower extremity, bilateral: Secondary | ICD-10-CM

## 2018-08-24 DIAGNOSIS — I2699 Other pulmonary embolism without acute cor pulmonale: Secondary | ICD-10-CM

## 2018-08-24 LAB — CBC WITH DIFFERENTIAL/PLATELET
Abs Immature Granulocytes: 0.01 10*3/uL (ref 0.00–0.07)
Basophils Absolute: 0 10*3/uL (ref 0.0–0.1)
Basophils Relative: 1 %
Eosinophils Absolute: 0 10*3/uL (ref 0.0–0.5)
Eosinophils Relative: 1 %
HCT: 32.6 % — ABNORMAL LOW (ref 39.0–52.0)
Hemoglobin: 10.8 g/dL — ABNORMAL LOW (ref 13.0–17.0)
Immature Granulocytes: 1 %
Lymphocytes Relative: 12 %
Lymphs Abs: 0.2 10*3/uL — ABNORMAL LOW (ref 0.7–4.0)
MCH: 31.9 pg (ref 26.0–34.0)
MCHC: 33.1 g/dL (ref 30.0–36.0)
MCV: 96.2 fL (ref 80.0–100.0)
Monocytes Absolute: 0.2 10*3/uL (ref 0.1–1.0)
Monocytes Relative: 15 %
Neutro Abs: 0.9 10*3/uL — ABNORMAL LOW (ref 1.7–7.7)
Neutrophils Relative %: 70 %
Platelets: 86 10*3/uL — ABNORMAL LOW (ref 150–400)
RBC: 3.39 MIL/uL — ABNORMAL LOW (ref 4.22–5.81)
RDW: 16 % — ABNORMAL HIGH (ref 11.5–15.5)
WBC: 1.3 10*3/uL — CL (ref 4.0–10.5)
nRBC: 4.6 % — ABNORMAL HIGH (ref 0.0–0.2)

## 2018-08-24 LAB — MAGNESIUM: Magnesium: 1.9 mg/dL (ref 1.7–2.4)

## 2018-08-24 LAB — PROTIME-INR
INR: 1.1 (ref 0.8–1.2)
Prothrombin Time: 14.1 seconds (ref 11.4–15.2)

## 2018-08-24 LAB — BASIC METABOLIC PANEL
Anion gap: 7 (ref 5–15)
BUN: 14 mg/dL (ref 6–20)
CO2: 24 mmol/L (ref 22–32)
Calcium: 8 mg/dL — ABNORMAL LOW (ref 8.9–10.3)
Chloride: 106 mmol/L (ref 98–111)
Creatinine, Ser: 0.62 mg/dL (ref 0.61–1.24)
GFR calc Af Amer: >60 mL/min (ref >60)
GFR calc non Af Amer: >60 mL/min (ref >60)
Glucose, Bld: 121 mg/dL — ABNORMAL HIGH (ref 70–99)
Potassium: 2.9 mmol/L — ABNORMAL LOW (ref 3.5–5.1)
Sodium: 137 mmol/L (ref 135–145)

## 2018-08-24 LAB — HEPATIC FUNCTION PANEL
ALT: 140 U/L — ABNORMAL HIGH (ref 0–44)
AST: 38 U/L (ref 15–41)
Albumin: 2.8 g/dL — ABNORMAL LOW (ref 3.5–5.0)
Alkaline Phosphatase: 88 U/L (ref 38–126)
Bilirubin, Direct: 0.2 mg/dL (ref 0.0–0.2)
Indirect Bilirubin: 0.7 mg/dL (ref 0.3–0.9)
Total Bilirubin: 0.9 mg/dL (ref 0.3–1.2)
Total Protein: 5.2 g/dL — ABNORMAL LOW (ref 6.5–8.1)

## 2018-08-24 LAB — GLUCOSE, CAPILLARY
Glucose-Capillary: 110 mg/dL — ABNORMAL HIGH (ref 70–99)
Glucose-Capillary: 112 mg/dL — ABNORMAL HIGH (ref 70–99)
Glucose-Capillary: 258 mg/dL — ABNORMAL HIGH (ref 70–99)
Glucose-Capillary: 230 mg/dL — ABNORMAL HIGH (ref 70–99)

## 2018-08-24 LAB — TROPONIN I: Troponin I: 0.15 ng/mL (ref <0.03)

## 2018-08-24 LAB — LACTIC ACID, PLASMA: Lactic Acid, Venous: 0.8 mmol/L (ref 0.5–1.9)

## 2018-08-24 LAB — HEPATITIS PANEL, ACUTE
HCV Ab: <0.1 s/co ratio (ref 0.0–0.9)
Hep A IgM: NEGATIVE
Hep B C IgM: NEGATIVE
Hepatitis B Surface Ag: NEGATIVE

## 2018-08-24 LAB — HEPARIN LEVEL (UNFRACTIONATED)
Heparin Unfractionated: 0.29 IU/mL — ABNORMAL LOW (ref 0.30–0.70)
Heparin Unfractionated: 0.39 IU/mL (ref 0.30–0.70)
Heparin Unfractionated: 0.32 IU/mL (ref 0.30–0.70)

## 2018-08-24 LAB — PHOSPHORUS: Phosphorus: 3.3 mg/dL (ref 2.5–4.6)

## 2018-08-24 MED ORDER — POTASSIUM CHLORIDE 20 MEQ/15ML (10%) PO SOLN
40.0000 meq | ORAL | Status: AC
  Administered 2018-08-24 (×3): 40 meq via ORAL
  Filled 2018-08-24 (×3): qty 30

## 2018-08-24 MED ORDER — FREE WATER
240.0000 mL | Freq: Three times a day (TID) | Status: DC
  Administered 2018-08-24 – 2018-08-27 (×8): 240 mL

## 2018-08-24 MED ORDER — FREE WATER: 60.0000 mL | Freq: Three times a day (TID) | Status: DC

## 2018-08-24 MED ORDER — FREE WATER
60.0000 mL | Freq: Three times a day (TID) | Status: DC
  Administered 2018-08-24 – 2018-08-27 (×12): 60 mL

## 2018-08-24 MED ORDER — OSMOLITE 1.5 CAL PO LIQD
474.0000 mL | Freq: Every day | ORAL | Status: DC
  Filled 2018-08-24 (×2): qty 474

## 2018-08-24 MED ORDER — OSMOLITE 1.5 CAL PO LIQD
355.5000 mL | Freq: Every day | ORAL | Status: DC
  Administered 2018-08-24: 355.5 mL
  Filled 2018-08-24: qty 474

## 2018-08-24 MED ORDER — OSMOLITE 1.5 CAL PO LIQD
474.0000 mL | Freq: Every day | ORAL | Status: DC
  Administered 2018-08-24: 12:00:00 474 mL
  Filled 2018-08-24 (×2): qty 474

## 2018-08-24 MED ORDER — FREE WATER
200.0000 mL | Freq: Four times a day (QID) | Status: DC
  Administered 2018-08-24: 11:00:00 200 mL

## 2018-08-24 MED ORDER — OSMOLITE 1.5 CAL PO LIQD
355.5000 mL | Freq: Every day | ORAL | Status: DC
  Administered 2018-08-24: 21:00:00 355.5 mL
  Filled 2018-08-24: qty 474

## 2018-08-24 MED ORDER — HEPARIN BOLUS VIA INFUSION
1500.0000 [IU] | Freq: Once | INTRAVENOUS | Status: AC
  Administered 2018-08-24: 08:00:00 1500 [IU] via INTRAVENOUS
  Filled 2018-08-24: qty 1500

## 2018-08-24 MED ORDER — SILVER SULFADIAZINE 1 % EX CREA
TOPICAL_CREAM | Freq: Two times a day (BID) | CUTANEOUS | Status: DC
  Administered 2018-08-24 – 2018-08-27 (×7): via TOPICAL
  Filled 2018-08-24: qty 85

## 2018-08-24 NOTE — Progress Notes (Addendum)
ANTICOAGULATION CONSULT NOTE  Pharmacy Consult:  Heparin Indication: pulmonary embolus  No Known Allergies  Patient Measurements: Height: 5\' 8"  (172.7 cm) Weight: 168 lb 14 oz (76.6 kg) IBW/kg (Calculated) : 68.4  Vital Signs: Temp: 98.2 F (36.8 C) (04/05 1331) Temp Source: Oral (04/05 1331) BP: 109/74 (04/05 1331) Pulse Rate: 109 (04/05 1331)  Labs: Recent Labs    08/22/18 0523  08/22/18 2130  08/23/18 0507 08/23/18 0508 08/23/18 1044 08/23/18 1734 08/24/18 0351 08/24/18 1417  HGB  --    < >  --   --  10.5*  --  10.9*  --  10.8*  --   HCT  --    < >  --   --  32.1*  --  33.1*  --  32.6*  --   PLT  --    < >  --   --  78*  --  83*  --  86*  --   LABPROT  --   --   --   --   --   --   --   --  14.1  --   INR  --   --   --   --   --   --   --   --  1.1  --   HEPARINUNFRC  --    < >  --    < >  --   --  0.65 0.34 0.29* 0.39  CREATININE 0.86  --   --   --   --  0.69  --   --  0.62  --   TROPONINI  --    < > 0.70*  --   --  0.52*  --   --  0.15*  --    < > = values in this interval not displayed.    Estimated Creatinine Clearance: 96.2 mL/min (by C-G formula based on SCr of 0.62 mg/dL).   Assessment: 58 y/o male SCC of left tonsil on chemotherapy who presented to the ED with near syncope and hyperglycemia. CTA chest shows bilateral PE with right heart strain. LLE duplex noted for DVT. Pharmacy consulted to dose IV heparin.  He is s/p EKOS.  4/5 PM: Heparin level is now therapeutic this afternoon at 0.39.  No issue with heparin infusion per RN and no bleeding reported.  Goal of Therapy:  Heparin level 0.3-0.7 units/ml Monitor platelets by anticoagulation protocol: Yes   Plan:  Continue heparin gtt to 1100 units/hr Confirmatory heparin level at 2100  F/U AM labs  Thank you for involving pharmacy in this patient's care.  Janae Bridgeman, PharmD PGY1 Pharmacy Resident Phone: [phone] 08/24/2018 2:53 PM   ______________  Plan: Confirmatory heparin level  therapeutic at 0.32. Will follow daily heparin levels.  Janae Bridgeman, PharmD PGY1 Pharmacy Resident Phone: [phone] 08/24/2018 9:46 PM

## 2018-08-24 NOTE — Progress Notes (Signed)
ANTICOAGULATION CONSULT NOTE  Pharmacy Consult:  Heparin Indication: pulmonary embolus  No Known Allergies  Patient Measurements: Height: 5\' 8"  (172.7 cm) Weight: 168 lb 14 oz (76.6 kg) IBW/kg (Calculated) : 68.4  Vital Signs: Temp: 97.8 F (36.6 C) (04/05 0442) Temp Source: Oral (04/05 0442) BP: 115/86 (04/05 0442) Pulse Rate: 92 (04/05 0442)  Labs: Recent Labs    08/22/18 0523  08/22/18 2130  08/23/18 0507 08/23/18 0508 08/23/18 1044 08/23/18 1734 08/24/18 0351  HGB  --    < >  --   --  10.5*  --  10.9*  --  10.8*  HCT  --    < >  --   --  32.1*  --  33.1*  --  32.6*  PLT  --    < >  --   --  78*  --  83*  --  86*  LABPROT  --   --   --   --   --   --   --   --  14.1  INR  --   --   --   --   --   --   --   --  1.1  HEPARINUNFRC  --    < >  --    < >  --   --  0.65 0.34 0.29*  CREATININE 0.86  --   --   --   --  0.69  --   --  0.62  TROPONINI  --    < > 0.70*  --   --  0.52*  --   --  0.15*   < > = values in this interval not displayed.    Estimated Creatinine Clearance: 96.2 mL/min (by C-G formula based on SCr of 0.62 mg/dL).   Assessment: 58 y/o male SCC of left tonsil on chemotherapy who presented to the ED with near syncope and hyperglycemia. CTA chest shows bilateral PE with right heart strain. LLE duplex noted for DVT. Pharmacy consulted to dose IV heparin.  He is s/p EKOS.  Heparin level is SUBtherapeutic with AM labs.  No issue with heparin infusion per RN.  No bleeding reported.  Goal of Therapy:  Heparin level 0.3-0.7 units/ml Monitor platelets by anticoagulation protocol: Yes   Plan:  Heparin bolus of 1500 units Increase heparin gtt to 1100 units/hr Heparin level in 6 hours F/U AM labs  Zahlia Deshazer A. Levada Dy, PharmD, [place] Please utilize Amion for appropriate phone number to reach the unit pharmacist ([place])   08/24/2018, 7:50 AM

## 2018-08-24 NOTE — Progress Notes (Signed)
Pharmacy consult entered to clarify and order home TF and free water flushes.   Patient takes  Osmolite 1.5 471ml in AM with breakfast Osmolite 1.5 450ml at lunch Osmolite 1.5 355.5 ml in PM with supper Osmolite 1.5 355.5 ml in PM at bedtime  Free water: 30ml before and after each bolus feed qid and as well as 241ml TID.   Orders entered.   Glenn Goodman A. Levada Dy, PharmD, [place] Pager: [phone] Please utilize Amion for appropriate phone number to reach the unit pharmacist ([place])

## 2018-08-24 NOTE — Progress Notes (Signed)
PROGRESS NOTE  Glenn Goodman [facility] GDJ:[id number] DOB: 05-30-1960 DOA: 08/21/2018 PCP: [name], MD  HPI/Recap of past 24 hours:  S/p catheter directed thrombolysis on 4/3 pm, Hemodynamically stable  He reports started to have diarrhea after changed to glucerna tube feeds, he rather go back on osmolites and use more insulin  Mouth is sore due to mucositis , feel dehydrated   Assessment/Plan: Principal Problem:   Hyperglycemia Active Problems:   Squamous cell carcinoma of left tonsil (HCC)   Sensorineural hearing loss (SNHL) of both ears   Oral candidiasis   AKI (acute kidney injury) ([place])   Leukopenia due to antineoplastic chemotherapy (HCC)   Chemotherapy-induced thrombocytopenia   Antineoplastic chemotherapy induced anemia   Dehydration   LFT elevation   Mucositis   Pancytopenia (HCC)   Hypokalemia  Hyperglycemia: with mild DKA on presentation Blood glucose 1047 on presentation. Gap closed ( from 16 to 7), blood glucose much improved after insulin drip and ivf He is transitioned to subQ insulin, continue hydration Possible cause of hyperglycemia, including tube feeds , recent steroids use, and possible underline diabetes Change tube feeds from osmolites to glucerna, he can not tolerate glucerna due to diarrhea, he wants to change back to osmolite and use more insulin  nutrition input appreciated,  Will need glucometer, diabetic testing supplies and insulin prescription at discharge  Acute bilateral PE/Bilateral DVT -patient had Near syncope/Acute hypoxic respiratory failure while at the cancer center, rapid response was called, he was brought to the ED -he is started on heparin drip, code PE activated, he underwent catheter directed thrombolysis on 4/3 --critical care and IR input appreciated -currently on heparin drip, will discuss with oncology regarding choice of anticoagulation , likely will transition to eliquis if oncology agrees  Diarrhea : possible tube feeds  related, reports started to have diarrhea after changing to glucerna Will discuss with nutrition about alternative choice of tube feeds May have to go back on osmolite with insulin Increase free water flush  Hypokalemia:remain low, continue to replace k, repeat lab in am, also check mag  AKI; Likely prerenal  Cr 1.33 on presentation, cr normalized today is 0.8  Elevated lft:  he denies ab pain, no n/v Will get ab [place] and hepatitis panel, repeat lab in am   Pancytopenia:  possible chemo /XRT related (anemia, thrombocytopenia, leukopenia), last chemo on 3/26, last xrt on 3/31 Monitor, hematology/oncology input appreciated  Mucositis: magic mouth wash/viscous lidocaine   Stage I (pT1pN1M0) left tonsil SCCa, p16+ Plan per oncology Dr Maylon Peppers: -S/p TORS in 05/2018, followed by adjuvant chemoradiation  -Chemoradiation completed on 08/21/2018 -No further treatment planned at this time -We will plan to repeat surveillance scans in 2-3 months for assess for any evidence of residual disease    Code Status: full  Family Communication: patient and wife over the phone  Disposition Plan: home early next week hopefully , depends on pancytopenia recovery , anticoagulation choice    Consultants:  Hematology/oncology  IR  Pulmonary /critical care  Procedures:  he underwent catheter directed thrombolysis on 4/3  Antibiotics:  none   Objective: BP 115/86 (BP Location: Left Arm)   Pulse 92   Temp 97.8 F (36.6 C) (Oral)   Resp 16   Ht 5' 8"  (1.727 m)   Wt 76.6 kg   SpO2 97%   BMI 25.68 kg/m   Intake/Output Summary (Last 24 hours) at 08/24/2018 0828 Last data filed at 08/24/2018 0746 Gross per 24 hour  Intake 1313.57 ml  Output 954 ml  Net 359.57 ml   Filed Weights   08/21/18 1737 08/22/18 0008 08/23/18 1617  Weight: 74.8 kg 77.4 kg 76.6 kg    Exam: Patient is examined daily including today on 08/24/2018, exams remain the same as of yesterday except that has changed     General:  NAD, + mucositis   Cardiovascular: RRR  Respiratory: CTABL  Abdomen: peg tube in place, Soft/ND/NT, positive BS  Musculoskeletal: No Edema  Neuro: alert, oriented   Data Reviewed: Basic Metabolic Panel: Recent Labs  Lab 08/20/18 1408 08/20/18 1612 08/21/18 1817 08/22/18 0523 08/23/18 0508 08/24/18 0351  NA 139 135 135 147* 143 137  K 4.4 4.7 4.6 3.3* 3.0* 2.9*  CL 98  --  102 117* 109 106  CO2 25  --  19* 23 26 24   GLUCOSE 850*  --  1,047* 130* 131* 121*  BUN 42*  --  47* 31* 15 14  CREATININE 1.60* 0.90 1.33* 0.86 0.69 0.62  CALCIUM 9.5  --  7.9* 8.0* 7.6* 8.0*  MG 2.3  --   --   --  2.0 1.9  PHOS  --   --   --   --   --  3.3   Liver Function Tests: Recent Labs  Lab 08/20/18 1603 08/21/18 1817 08/22/18 0523 08/23/18 0508 08/24/18 0351  AST 18 120* 116* 73* 38  ALT 41 155* 203* 190* 140*  ALKPHOS 131* 134* 94 83 88  BILITOT 0.4 0.8 0.5 1.0 0.9  PROT 6.8 5.5* 5.1* 4.6* 5.2*  ALBUMIN 3.9 3.3* 3.0* 2.8* 2.8*   Recent Labs  Lab 08/23/18 0508  LIPASE 17   No results for input(s): AMMONIA in the last 168 hours. CBC: Recent Labs  Lab 08/20/18 1408  08/20/18 1602  08/21/18 1817  08/22/18 1807 08/22/18 2129 08/23/18 0507 08/23/18 1044 08/24/18 0351  WBC 2.4*   < > 2.1*  --  3.4*   < > 2.0* 1.8* 1.9* 2.0* 1.3*  NEUTROABS 2.0  --  1.7  --  3.1  --   --   --   --   --  0.9*  HGB 12.8*  --  12.4*   < > 11.8*   < > 9.9* 9.7* 10.5* 10.9* 10.8*  HCT 38.8*  --  37.8*   < > 37.2*   < > 30.2* 29.9* 32.1* 33.1* 32.6*  MCV 98.0  --  98.7  --  103.6*   < > 95.3 97.7 98.5 95.4 96.2  PLT 129*   < > 126*  --  105*   < > 78* 74* 78* 83* 86*   < > = values in this interval not displayed.   Cardiac Enzymes:   Recent Labs  Lab 08/22/18 1807 08/22/18 2130 08/23/18 0508 08/24/18 0351  TROPONINI 0.56* 0.70* 0.52* 0.15*   BNP (last 3 results) Recent Labs    08/22/18 1807  BNP 457.1*    ProBNP (last 3 results) No results for input(s): PROBNP in  the last 8760 hours.  CBG: Recent Labs  Lab 08/23/18 0514 08/23/18 0859 08/23/18 1304 08/23/18 1554 08/24/18 0753  GLUCAP 122* 114* 203* 140* 110*    No results found for this or any previous visit (from the past 240 hour(s)).   Studies: Dg Chest Port 1 View  Result Date: 08/23/2018 CLINICAL DATA:  Post initiation of bilateral pulmonary arterial catheter directed thrombolysis. Evaluate pulmonary infusion catheter positioning. EXAM: PORTABLE CHEST 1 VIEW COMPARISON:  Chest radiograph - 08/21/2018;  initiation of bilateral pulmonary arterial catheter directed thrombolysis - 08/22/2018; chest CT - 08/22/2018 FINDINGS: Grossly unchanged cardiac silhouette and mediastinal contours. Unchanged positioning of bilateral pulmonary arterial infusion catheters with tips overlying the expected location of the bilateral lower lobe subsegmental pulmonary arteries the right approximately 15 cm from the expected location of the main pulmonary artery and the left approximately 17 cm. Stable positioning of right jugular approach port a catheter with tip projected over the superior cavoatrial junction. No focal airspace opacities. No pleural effusion or pneumothorax no evidence of edema. No acute osseous abnormalities. IMPRESSION: Unchanged positioning of bilateral pulmonary arterial lytic infusion catheters. Electronically Signed   By: Sandi Mariscal M.D.   On: 08/23/2018 09:00   Ir Jacolyn Reedy F/u Eval Art/ven Final Day (ms)  Result Date: 08/23/2018 INDICATION: History of sub massive pulmonary embolism, post initiation of bilateral catheter directed pulmonary arterial thrombolysis. Request made for pulmonary arterial pressure measurement prior to removal of infusion catheters. Discussion of appropriateness of proceeding with IVC filter placement for temporary caval interruption purposes was discussed with ICU attending, Dr. Chase Caller, however the decision was made not to proceed with filter placement this time as the  patient appears to be tolerating initiated anticoagulation without incident. EXAM: BEDSIDE PULMONARY ARTERIAL PRESSURE MEASUREMENT AND PULMONARY INFUSION CATHETER REMOVAL COMPARISON:  Chest radiograph - earlier same day; Initiation of bilateral pulmonary arterial catheter directed thrombolysis - 08/22/2018; chest CT-08/22/2018 MEDICATIONS: None CONTRAST:  None FLUOROSCOPY TIME:  None COMPLICATIONS: None immediate. TECHNIQUE: Review of chest radiograph performed earlier this morning demonstrates unchanged positioning of bilateral pulmonary arterial infusion catheters. The patient was positioned supine on his hospital bed. The right pulmonary arterial catheter infusion wire was removed and the multi side-hole infusion catheter was retracted approximately 15 cm to the level of the main pulmonary artery. Pressure measurements were acquired from this location. At this point, the procedure was terminated. All wires, catheters and sheaths were removed from the patient. Hemostasis was achieved at the right groin access site with manual compression. A dressing was placed. The patient tolerated the procedure well without immediate postprocedural complication. FINDINGS: Preprocedural spot chest radiograph demonstrates unchanged positioning of bilateral pulmonary arterial infusion catheters. The right pulmonary arterial infusion catheter was retracted approximately 15 cm to the level of the main pulmonary artery. Acquired pressure measurements as follows: Preprocedural main pulmonary artery - unable to be obtained secondary to device malfunction. Postprocedural main pulmonary artery - 44/26; mean - 33 IMPRESSION: Technically successful completion of bilateral catheter directed pulmonary arterial thrombolysis. Electronically Signed   By: Sandi Mariscal M.D.   On: 08/23/2018 10:14    Scheduled Meds: . clotrimazole  10 mg Oral 5 X Daily  . docusate  100 mg Per Tube BID  . feeding supplement (GLUCERNA 1.5 CAL)  474 mL Per Tube  TID WC & HS  . fluconazole  100 mg Per Tube Daily  . insulin aspart  0-9 Units Subcutaneous Q4H  . insulin glargine  12 Units Subcutaneous QHS  . insulin starter kit- pen needles  1 kit Other Once  . mouth rinse  15 mL Mouth Rinse BID  . potassium chloride  40 mEq Oral Q4H  . pramipexole  1 mg Per Tube QHS  . sodium chloride flush  10-40 mL Intracatheter Q12H  . sodium chloride flush  3 mL Intravenous Q12H  . sodium chloride flush  3 mL Intravenous Q12H    Continuous Infusions: . sodium chloride 125 mL/hr at 08/22/18 0336  . sodium chloride    .  sodium chloride    . sodium chloride    . sodium chloride    . sodium chloride    . sodium chloride    . sodium chloride Stopped (08/23/18 0935)  . sodium chloride Stopped (08/23/18 0935)  . sodium chloride    . sodium chloride Stopped (08/23/18 0935)  . heparin 1,100 Units/hr (08/24/18 0819)     Time spent: 90mns I have personally reviewed and interpreted on  08/24/2018 daily labs, tele strips, imagings as discussed above under date review session and assessment and plans.  I reviewed all nursing notes, pharmacy notes, consultant notes,  vitals, pertinent old records  I have discussed plan of care as described above with RN , patient and family on 08/24/2018   FFlorencia ReasonsMD, PhD  [facility] Pager [phone] If 7PM-7AM, please contact night-coverage at [email or web address], password [email or web address]4/09/2018, 8:28 AM  LOS: 3 days

## 2018-08-25 ENCOUNTER — Ambulatory Visit: Payer: Self-pay

## 2018-08-25 DIAGNOSIS — J9621 Acute and chronic respiratory failure with hypoxia: Secondary | ICD-10-CM

## 2018-08-25 DIAGNOSIS — I2692 Saddle embolus of pulmonary artery without acute cor pulmonale: Secondary | ICD-10-CM

## 2018-08-25 LAB — COMPREHENSIVE METABOLIC PANEL
ALT: 93 U/L — ABNORMAL HIGH (ref 0–44)
AST: 18 U/L (ref 15–41)
Albumin: 2.8 g/dL — ABNORMAL LOW (ref 3.5–5.0)
Alkaline Phosphatase: 96 U/L (ref 38–126)
Anion gap: 8 (ref 5–15)
BUN: 13 mg/dL (ref 6–20)
CO2: 26 mmol/L (ref 22–32)
Calcium: 8.4 mg/dL — ABNORMAL LOW (ref 8.9–10.3)
Chloride: 104 mmol/L (ref 98–111)
Creatinine, Ser: 0.54 mg/dL — ABNORMAL LOW (ref 0.61–1.24)
GFR calc Af Amer: >60 mL/min (ref >60)
GFR calc non Af Amer: >60 mL/min (ref >60)
Glucose, Bld: 125 mg/dL — ABNORMAL HIGH (ref 70–99)
Potassium: 3.5 mmol/L (ref 3.5–5.1)
Sodium: 138 mmol/L (ref 135–145)
Total Bilirubin: 0.6 mg/dL (ref 0.3–1.2)
Total Protein: 5 g/dL — ABNORMAL LOW (ref 6.5–8.1)

## 2018-08-25 LAB — CBC WITH DIFFERENTIAL/PLATELET
Abs Immature Granulocytes: 0.02 10*3/uL (ref 0.00–0.07)
Basophils Absolute: 0 10*3/uL (ref 0.0–0.1)
Basophils Relative: 0 %
Eosinophils Absolute: 0 10*3/uL (ref 0.0–0.5)
Eosinophils Relative: 0 %
HCT: 31.7 % — ABNORMAL LOW (ref 39.0–52.0)
Hemoglobin: 10.5 g/dL — ABNORMAL LOW (ref 13.0–17.0)
Immature Granulocytes: 2 %
Lymphocytes Relative: 14 %
Lymphs Abs: 0.2 10*3/uL — ABNORMAL LOW (ref 0.7–4.0)
MCH: 31.7 pg (ref 26.0–34.0)
MCHC: 33.1 g/dL (ref 30.0–36.0)
MCV: 95.8 fL (ref 80.0–100.0)
Monocytes Absolute: 0.2 10*3/uL (ref 0.1–1.0)
Monocytes Relative: 17 %
Neutro Abs: 0.7 10*3/uL — ABNORMAL LOW (ref 1.7–7.7)
Neutrophils Relative %: 67 %
Platelets: 110 10*3/uL — ABNORMAL LOW (ref 150–400)
RBC: 3.31 MIL/uL — ABNORMAL LOW (ref 4.22–5.81)
RDW: 16.1 % — ABNORMAL HIGH (ref 11.5–15.5)
WBC: 1.1 10*3/uL — CL (ref 4.0–10.5)
nRBC: 3.7 % — ABNORMAL HIGH (ref 0.0–0.2)

## 2018-08-25 LAB — GLUCOSE, CAPILLARY
Glucose-Capillary: 114 mg/dL — ABNORMAL HIGH (ref 70–99)
Glucose-Capillary: 161 mg/dL — ABNORMAL HIGH (ref 70–99)
Glucose-Capillary: 162 mg/dL — ABNORMAL HIGH (ref 70–99)
Glucose-Capillary: 211 mg/dL — ABNORMAL HIGH (ref 70–99)
Glucose-Capillary: 250 mg/dL — ABNORMAL HIGH (ref 70–99)
Glucose-Capillary: 360 mg/dL — ABNORMAL HIGH (ref 70–99)
Glucose-Capillary: 407 mg/dL — ABNORMAL HIGH (ref 70–99)
Glucose-Capillary: 57 mg/dL — ABNORMAL LOW (ref 70–99)

## 2018-08-25 LAB — URINALYSIS, ROUTINE W REFLEX MICROSCOPIC
Bilirubin Urine: NEGATIVE
Glucose, UA: 50 mg/dL — AB
Hgb urine dipstick: NEGATIVE
Ketones, ur: 5 mg/dL — AB
Leukocytes,Ua: NEGATIVE
Nitrite: NEGATIVE
Protein, ur: NEGATIVE mg/dL
Specific Gravity, Urine: 1.017 (ref 1.005–1.030)
pH: 7 (ref 5.0–8.0)

## 2018-08-25 LAB — MAGNESIUM: Magnesium: 1.9 mg/dL (ref 1.7–2.4)

## 2018-08-25 LAB — PATHOLOGIST SMEAR REVIEW

## 2018-08-25 LAB — HEPARIN LEVEL (UNFRACTIONATED)
Heparin Unfractionated: 0.27 IU/mL — ABNORMAL LOW (ref 0.30–0.70)
Heparin Unfractionated: 0.32 IU/mL (ref 0.30–0.70)

## 2018-08-25 LAB — PHOSPHORUS: Phosphorus: 2.9 mg/dL (ref 2.5–4.6)

## 2018-08-25 MED ORDER — INSULIN ASPART 100 UNIT/ML ~~LOC~~ SOLN
3.0000 [IU] | Freq: Three times a day (TID) | SUBCUTANEOUS | Status: DC
  Administered 2018-08-26 – 2018-08-27 (×4): 3 [IU] via SUBCUTANEOUS

## 2018-08-25 MED ORDER — INSULIN GLARGINE 100 UNIT/ML ~~LOC~~ SOLN
18.0000 [IU] | Freq: Every day | SUBCUTANEOUS | Status: DC
  Administered 2018-08-25 – 2018-08-26 (×2): 18 [IU] via SUBCUTANEOUS
  Filled 2018-08-25 (×3): qty 0.18

## 2018-08-25 MED ORDER — SENNOSIDES-DOCUSATE SODIUM 8.6-50 MG PO TABS
1.0000 | ORAL_TABLET | Freq: Two times a day (BID) | ORAL | Status: DC
  Administered 2018-08-25 – 2018-08-27 (×5): 1
  Filled 2018-08-25 (×5): qty 1

## 2018-08-25 MED ORDER — INSULIN ASPART 100 UNIT/ML ~~LOC~~ SOLN: 0.0000 [IU] | SUBCUTANEOUS | Status: DC

## 2018-08-25 MED ORDER — GUAIFENESIN 100 MG/5ML PO SOLN
5.0000 mL | Freq: Two times a day (BID) | ORAL | Status: DC
  Administered 2018-08-25: 100 mg
  Filled 2018-08-25 (×5): qty 5

## 2018-08-25 MED ORDER — OSMOLITE 1.5 CAL PO LIQD
237.0000 mL | Freq: Every day | ORAL | Status: DC
  Administered 2018-08-25 – 2018-08-26 (×2): 237 mL
  Filled 2018-08-25 (×3): qty 237

## 2018-08-25 MED ORDER — PRO-STAT SUGAR FREE PO LIQD
30.0000 mL | Freq: Three times a day (TID) | ORAL | Status: DC
  Administered 2018-08-25 – 2018-08-27 (×7): 30 mL
  Filled 2018-08-25 (×6): qty 30

## 2018-08-25 MED ORDER — OSMOLITE 1.5 CAL PO LIQD
474.0000 mL | Freq: Three times a day (TID) | ORAL | Status: DC
  Administered 2018-08-25 – 2018-08-27 (×6): 474 mL
  Filled 2018-08-25 (×9): qty 474

## 2018-08-25 MED ORDER — INSULIN ASPART 100 UNIT/ML ~~LOC~~ SOLN
0.0000 [IU] | SUBCUTANEOUS | Status: DC
  Administered 2018-08-25: 13:00:00 20 [IU] via SUBCUTANEOUS

## 2018-08-25 MED ORDER — INSULIN ASPART 100 UNIT/ML ~~LOC~~ SOLN
0.0000 [IU] | Freq: Three times a day (TID) | SUBCUTANEOUS | Status: DC
  Administered 2018-08-26: 3 [IU] via SUBCUTANEOUS

## 2018-08-25 NOTE — Progress Notes (Signed)
HEMATOLOGY-ONCOLOGY PROGRESS NOTE  SUBJECTIVE: Resting quietly.  He developed diarrhea with change in his tube feeds from Osmolite to Glucerna.  Still has pain secondary to mucositis.  Denies fevers and chills.  Denies chest discomfort and shortness of breath.  No bleeding.    Squamous cell carcinoma of left tonsil ([place])   03/20/2018 Procedure    FNA of the left cervical LN in office    03/20/2018 Pathology Results    Non-diagnostic    04/07/2018 Procedure    US-guided left cervical LN biopsy/FNA     04/07/2018 Pathology Results    (Accession: [id number]) Lymph node, needle/core biopsy, left cervical - METASTATIC SQUAMOUS CELL CARCINOMA INVOLVING A LYMPH NODE (1/1) - SEE COMMENT  p16 IHC positive. EBV ISH negative.     04/22/2018 Pathology Results    (Accession: [id number]) Tonsil, biopsy, left, base of tongue - INVASIVE KERATINIZING SQUAMOUS CELL CARCINOMA.    04/29/2018 Imaging    PET:  IMPRESSION: 1. Left palatine tonsil/adjacent pharyngeal mucosal space lesion is asymmetric, measures about 1.3 cm in long axis, and has a maximum SUV of 8.2, compatible with malignancy. There is associated hypermetabolic left level IIa adenopathy as shown on prior exams. 2. Symmetric accentuated activity in the lymphoid tissue at the tongue base, maximum SUV 5.3, quite likely physiologic given the symmetry.  3. No findings of metastatic disease Pred to the chest, abdomen/pelvis, or skeleton.    05/26/2018 Surgery    Radical tonsillectomy    05/26/2018 Pathology Results    PROCEDURE: Radical tonsillectomy TUMOR SITE: Glossotonsillar sulcus TUMOR LATERALITY: LEFT TUMOR FOCALITY: Unifocal TUMOR SIZE: GREATEST DIMENSION: 1.6 cm HISTOLOGIC TYPE: HPV-mediated squamous cell carcinoma MARGINS: Involved by invasive tumor (focal) Specify location of closest margin: Tongue base (soft tissue) Note: Although an additional tongue base mucosal margin was submitted, location of tumor extension  to margin appears 1 cm from the mucosal surface. LYMPHOVASCULAR INVASION: Not identified PERINEURAL INVASION: Present REGIONAL LYMPH NODES:  NUMBER OF LYMPH NODES INVOLVED: 1  NUMBER OF LYMPH NODES EXAMINED: 6  LATERALITY OF LYMPH NODES INVOLVED: Ipsilateral  SIZE OF LARGEST METASTATIC DEPOSIT: 6.8 cm  EXTRANODAL EXTENSION: Present   Distance from lymph node capsule: 6 millimeters PATHOLOGIC STAGE CLASSIFICATION (pTNM, AJCC 8TH Ed): pT1 pN1 (HPV-mediated oropharynx tumor)  pT1: Tumor 2 cm or smaller in greatest dimension  pN1: Metastasis in 4 or fewer lymph nodes ANCILLARY STUDIES:  p16 expression by immunohistochemistry: Positive  HPV by PCR: pending    05/26/2018 Cancer Staging    Staging form: Pharynx - HPV-Mediated Oropharynx, AJCC 8th Edition - Pathologic stage from 05/26/2018: Stage I (pT1, pN1, cM0, p16+) - Signed by Tish Men, MD on 06/18/2018    07/10/2018 - 07/10/2018 Chemotherapy    The patient had palonosetron (ALOXI) injection 0.25 mg, 0.25 mg, Intravenous,  Once, 0 of 3 cycles CISplatin (PLATINOL) 198 mg in sodium chloride 0.9 % 500 mL chemo infusion, 100 mg/m2 = 198 mg, Intravenous,  Once, 0 of 3 cycles fosaprepitant (EMEND) 150 mg, dexamethasone (DECADRON) 12 mg in sodium chloride 0.9 % 145 mL IVPB, , Intravenous,  Once, 0 of 3 cycles  for chemotherapy treatment.     07/10/2018 -  Chemotherapy    The patient had palonosetron (ALOXI) injection 0.25 mg, 0.25 mg, Intravenous,  Once, 6 of 6 cycles Administration: 0.25 mg (07/10/2018), 0.25 mg (07/18/2018), 0.25 mg (07/24/2018), 0.25 mg (07/31/2018), 0.25 mg (08/07/2018), 0.25 mg (08/14/2018) CISplatin (PLATINOL) 79 mg in sodium chloride 0.9 % 250 mL chemo infusion, 40 mg/m2 = 79  mg, Intravenous,  Once, 6 of 6 cycles Administration: 79 mg (07/10/2018), 79 mg (07/18/2018), 79 mg (07/24/2018), 79 mg (07/31/2018), 79 mg (08/07/2018), 79 mg (08/14/2018) fosaprepitant (EMEND) 150 mg, dexamethasone (DECADRON) 12 mg in sodium  chloride 0.9 % 145 mL IVPB, , Intravenous,  Once, 6 of 6 cycles Administration:  (07/10/2018),  (07/18/2018),  (07/24/2018),  (07/31/2018),  (08/07/2018),  (08/14/2018)  for chemotherapy treatment.      Malignant neoplasm of base of tongue ([place])   05/09/2018 Initial Diagnosis    Malignant neoplasm of base of tongue ([place])    05/09/2018 Cancer Staging    Staging form: Pharynx - HPV-Mediated Oropharynx, AJCC 8th Edition - Clinical: Stage I (cT1, cN1, cM0, p16+) - Signed by Eppie Gibson, MD on 05/09/2018    06/11/2018 Cancer Staging    Staging form: Pharynx - HPV-Mediated Oropharynx, AJCC 8th Edition - Pathologic: Stage I (pT1, pN1, cM0, p16+) - Signed by Eppie Gibson, MD on 06/11/2018     REVIEW OF SYSTEMS:   Constitutional: Denies fevers, chills Eyes: Denies blurriness of vision Throat: reports mucositis. Respiratory: Denies cough, dyspnea or wheezes Cardiovascular: Denies palpitation, chest discomfort Gastrointestinal: Develop diarrhea after changing tube feeds from Osmolite to Glucerna. Skin: Denies abnormal skin rashes Lymphatics: Denies new lymphadenopathy or easy bruising Neurological:Denies numbness, tingling or new weaknesses Behavioral/Psych: Mood is stable, no new changes  Extremities: No lower extremity edema All other systems were reviewed with the patient and are negative.  I have reviewed the past medical history, past surgical history, social history and family history with the patient and they are unchanged from previous note.   PHYSICAL EXAMINATION:  Vitals:   08/24/18 2129 08/25/18 0622  BP: 107/85 108/81  Pulse: (!) 110 (!) 114  Resp:    Temp: 98 F (36.7 C) 97.8 F (36.6 C)  SpO2: 97% 98%   Filed Weights   08/21/18 1737 08/22/18 0008 08/23/18 1617  Weight: 165 lb (74.8 kg) 170 lb 9.6 oz (77.4 kg) 168 lb 14 oz (76.6 kg)    GENERAL:alert, no distress and comfortable SKIN: skin color, texture, turgor are normal, no rashes or significant lesions EYES:  normal, Conjunctiva are pink and non-injected, sclera clear OROPHARYNX: Mucositis noted. LYMPH:  no palpable lymphadenopathy in the cervical, axillary or inguinal LUNGS: clear to auscultation and percussion with normal breathing effort HEART: Tachycardic.  Regular rhythm and no murmurs and no lower extremity edema ABDOMEN:abdomen soft, non-tender and normal bowel sounds Musculoskeletal:no cyanosis of digits and no clubbing  NEURO: alert & oriented x 3 with fluent speech, no focal motor/sensory deficits  LABORATORY DATA:  I have reviewed the data as listed CMP Latest Ref Rng & Units 08/25/2018 08/24/2018 08/23/2018  Glucose 70 - 99 mg/dL 125(H) 121(H) 131(H)  BUN 6 - 20 mg/dL 13 14 15   Creatinine 0.61 - 1.24 mg/dL 0.54(L) 0.62 0.69  Sodium 135 - 145 mmol/L 138 137 143  Potassium 3.5 - 5.1 mmol/L 3.5 2.9(L) 3.0(L)  Chloride 98 - 111 mmol/L 104 106 109  CO2 22 - 32 mmol/L 26 24 26   Calcium 8.9 - 10.3 mg/dL 8.4(L) 8.0(L) 7.6(L)  Total Protein 6.5 - 8.1 g/dL 5.0(L) 5.2(L) 4.6(L)  Total Bilirubin 0.3 - 1.2 mg/dL 0.6 0.9 1.0  Alkaline Phos 38 - 126 U/L 96 88 83  AST 15 - 41 U/L 18 38 73(H)  ALT 0 - 44 U/L 93(H) 140(H) 190(H)    Lab Results  Component Value Date   WBC 1.1 (LL) 08/25/2018   HGB 10.5 (L)  08/25/2018   HCT 31.7 (L) 08/25/2018   MCV 95.8 08/25/2018   PLT 110 (L) 08/25/2018   NEUTROABS 0.7 (L) 08/25/2018    ASSESSMENT AND PLAN: Stage I (pT1pN1M0) left tonsil SCCa, p16+ -S/p TORS in 05/2018, followed by adjuvant chemoradiation  -Chemoradiation completed on 08/21/2018 -No further treatment planned at this time -We will plan to repeat surveillance scans in 2-3 months for assess for any evidence of residual disease   Newly diagnosed bilateral pulmonary emboli  -Currently on heparin drip. -Status post pulmonary arterial catheter directed thrombolysis by interventional radiology -Okay to transition patient to Eliquis from an oncology perspective if no further procedures are  planned.  Severe hyperglycemia -Etiology unclear; possibly due to undiagnosed pre-diabetes, exacerbated by tube feeding and high glucose intake  -Cisplatin not commonly associated with hyperglycemia or pancreas insufficiency -Glucose > 600 in clinic on 08/21/2018 and patient was admitted for hyperglycemic hyperosmolar syndrome -He initially received an insulin drip.  He has now been transitioned to Lantus with NovoLog sliding scale. -He was transitioned from Osmolite to Glucerna over concern that his tubings were driving his blood sugar up.  He could not tolerate the Glucerna secondary to diarrhea.  He has been switched back to Osmolite.  Blood sugars are trending upward.  He will need adjustment of his Lantus and NovoLog.  Will defer this to his hospitalist. -Finally, I emphasized with the patient that he should have close follow-up with PCP next week for further management of the hyperglycemia and adjustment of his insulin regimen as needed   Chemotherapy-associated anemia -Secondary to chemotherapy -Hgb 10.5, slightly lower, likely due to dilution effect -Patient denies any symptom of bleeding -We will monitor for now  Chemotherapy-associated leukopenia -Secondary to chemotherapy and likely in part due to his acute illness -WBC 1.1k, slightly lower, likely due to dilution effect -Patient denies any symptoms of infection and he remains afebrile -There is no role for G-CSF unless fever or clinical decline. -We will monitor for now  Chemotherapy-associated thrombocytopenia -Secondary to chemotherapy -Plts 110k,  which is slightly improved -Patient denies any symptoms of bleeding or excess bruising, such as epistaxis, hematochezia, melena, or hematuria -We will monitor for now  Mikey Bussing, DNP, AGPCNP-BC, AOCNP

## 2018-08-25 NOTE — Progress Notes (Signed)
ANTICOAGULATION CONSULT NOTE  Pharmacy Consult:  Heparin Indication: pulmonary embolus  No Known Allergies  Patient Measurements: Height: 5\' 8"  (172.7 cm) Weight: 168 lb 14 oz (76.6 kg) IBW/kg (Calculated) : 68.4  Vital Signs: Temp: 97.8 F (36.6 C) (04/06 0622) BP: 108/81 (04/06 0622) Pulse Rate: 114 (04/06 0622)  Labs: Recent Labs    08/22/18 2130  08/23/18 0508 08/23/18 1044  08/24/18 0351 08/24/18 1417 08/24/18 2118 08/25/18 0318  HGB  --    < >  --  10.9*  --  10.8*  --   --  10.5*  HCT  --    < >  --  33.1*  --  32.6*  --   --  31.7*  PLT  --    < >  --  83*  --  86*  --   --  110*  LABPROT  --   --   --   --   --  14.1  --   --   --   INR  --   --   --   --   --  1.1  --   --   --   HEPARINUNFRC  --    < >  --  0.65   < > 0.29* 0.39 0.32 0.27*  CREATININE  --   --  0.69  --   --  0.62  --   --  0.54*  TROPONINI 0.70*  --  0.52*  --   --  0.15*  --   --   --    < > = values in this interval not displayed.    Estimated Creatinine Clearance: 96.2 mL/min (A) (by C-G formula based on SCr of 0.54 mg/dL (L)).   Assessment: 58 y/o male SCC of left tonsil on chemotherapy who presented to the ED with near syncope and hyperglycemia. CTA chest shows bilateral PE with right heart strain. LLE duplex noted for DVT. Pharmacy consulted to dose IV heparin.  He is s/p EKOS.  Heparin level slightly subtherapeutic at 0.27, H/H low but stable, plts improved.    Goal of Therapy:  Heparin level 0.3-0.7 units/ml Monitor platelets by anticoagulation protocol: Yes   Plan:  Increase heparin gtt to 1200 units/hr F/u 6 hour heparin level  Bertis Ruddy, PharmD Clinical Pharmacist Please check AMION for all [place] numbers 08/25/2018 8:05 AM
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
## 2018-08-25 NOTE — Progress Notes (Signed)
Dr. Erlinda Hong made aware of patient's blood sugars.

## 2018-08-25 NOTE — TOC Benefit Eligibility Note (Signed)
Transition of Care [facility]) Benefit Eligibility Note    Patient Details  Name: Glenn Goodman MRN: [id number] Date of Birth: Sep 15, 1960   Medication/Dose: Alveda Reasons  15 MG   AND   XARELTO   20 MG DAILY(BOTH ARE NON-FORMULARY)  Covered?: No(NON-FORMULARY)     Prescription Coverage Preferred Pharmacy: YES([facility])  Spoke with Person/Company/Phone Number:: SHARON([employer] rx # [phone])     Prior Approval: UVQ([phone])     Additional Notes: ELIQUIS  5 MG BID   COVER- YES, CO-PAY- ZERO DOLLARS PRIOR APPROVAL- NO (ELIQUIS  10 MG  BID AND ELIQUIS  2.5 MG  BOTH ARE NON-FORMULARY)    Memory Argue Phone Number: 08/25/2018, 10:45 AM

## 2018-08-25 NOTE — Progress Notes (Signed)
ANTICOAGULATION CONSULT NOTE - Follow Up Consult  Pharmacy Consult for Heparin Indication: B PE  No Known Allergies  Patient Measurements: Height: 5\' 8"  (172.7 cm) Weight: 168 lb 14 oz (76.6 kg) IBW/kg (Calculated) : 68.4 Heparin Dosing Weight:   Vital Signs: Temp: 97.8 F (36.6 C) (04/06 0622) BP: 108/81 (04/06 0622) Pulse Rate: 114 (04/06 0622)  Labs: Recent Labs    08/22/18 2130  08/23/18 0508 08/23/18 1044  08/24/18 0351  08/24/18 2118 08/25/18 0318 08/25/18 1554  HGB  --    < >  --  10.9*  --  10.8*  --   --  10.5*  --   HCT  --    < >  --  33.1*  --  32.6*  --   --  31.7*  --   PLT  --    < >  --  83*  --  86*  --   --  110*  --   LABPROT  --   --   --   --   --  14.1  --   --   --   --   INR  --   --   --   --   --  1.1  --   --   --   --   HEPARINUNFRC  --    < >  --  0.65   < > 0.29*   < > 0.32 0.27* 0.32  CREATININE  --   --  0.69  --   --  0.62  --   --  0.54*  --   TROPONINI 0.70*  --  0.52*  --   --  0.15*  --   --   --   --    < > = values in this interval not displayed.    Estimated Creatinine Clearance: 96.2 mL/min (A) (by C-G formula based on SCr of 0.54 mg/dL (L)).  Assessment: Anticoag: heparin for bilateral PE with RH strain S/p Ekos - TPA complete. HL 0.32 now in goal range.  Goal of Therapy:  Heparin level 0.3-0.7 units/ml Monitor platelets by anticoagulation protocol: Yes   Plan:  Increase heparin gtt to 1300 units/hr so it doesn't fall below goal again. Daily HL and CBC  Lando Alcalde S. Alford Highland, PharmD, BCPS Clinical Staff Pharmacist Eilene Ghazi Stillinger 08/25/2018,4:35 PM

## 2018-08-25 NOTE — Progress Notes (Signed)
Inpatient Diabetes Program Recommendations  AACE/ADA: New Consensus Statement on Inpatient Glycemic Control (2015)  Target Ranges:  Prepandial:   less than 140 mg/dL      Peak postprandial:   less than 180 mg/dL (1-2 hours)      Critically ill patients:  140 - 180 mg/dL   Lab Results  Component Value Date   GLUCAP 162 (H) 08/25/2018   HGBA1C 10.2 (H) 08/23/2018    Review of Glycemic Control Results for Glenn Goodman, Glenn Goodman (MRN [id number]) as of 08/25/2018 11:57  Ref. Range 08/24/2018 20:26 08/25/2018 00:41 08/25/2018 04:25  Glucose-Capillary Latest Ref Range: 70 - 99 mg/dL 230 (H) 211 (H) 114 (H)   Diabetes history: No DM hx Outpatient Diabetes medications: Metformin 500 mg BID (prescribed by ED MD on 4/1) Current orders for Inpatient glycemic control: Novolog 0-9 units Q4H, Lantus 12 units QHS Osmolite 474 ml TID, 237 ml QHS Prostat 30 ml TID  Inpatient Diabetes Program Recommendations:    Consider adding Novolog 3 units TID (to coordinate with Osmolite 474 ml) for tube feed coverage (0800, 1200, 1600). To be held if tube feeds are stopped for any reason.  Will plan to check back in with patient once discharge is near to review insulin for discharge.  Thanks, Bronson Curb, MSN, RNC-OB Diabetes Coordinator [phone] (8a-5p)
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
## 2018-08-26 ENCOUNTER — Ambulatory Visit: Payer: Self-pay

## 2018-08-26 LAB — CBC WITH DIFFERENTIAL/PLATELET
Abs Immature Granulocytes: 0 10*3/uL (ref 0.00–0.07)
Basophils Absolute: 0 10*3/uL (ref 0.0–0.1)
Basophils Relative: 1 %
Eosinophils Absolute: 0 10*3/uL (ref 0.0–0.5)
Eosinophils Relative: 0 %
HCT: 31.8 % — ABNORMAL LOW (ref 39.0–52.0)
Hemoglobin: 10.4 g/dL — ABNORMAL LOW (ref 13.0–17.0)
Lymphocytes Relative: 13 %
Lymphs Abs: 0.2 10*3/uL — ABNORMAL LOW (ref 0.7–4.0)
MCH: 31.5 pg (ref 26.0–34.0)
MCHC: 32.7 g/dL (ref 30.0–36.0)
MCV: 96.4 fL (ref 80.0–100.0)
Monocytes Absolute: 0.1 10*3/uL (ref 0.1–1.0)
Monocytes Relative: 10 %
Neutro Abs: 1 10*3/uL — ABNORMAL LOW (ref 1.7–7.7)
Neutrophils Relative %: 76 %
Platelets: 128 10*3/uL — ABNORMAL LOW (ref 150–400)
RBC: 3.3 MIL/uL — ABNORMAL LOW (ref 4.22–5.81)
RDW: 16.7 % — ABNORMAL HIGH (ref 11.5–15.5)
WBC: 1.3 10*3/uL — CL (ref 4.0–10.5)
nRBC: 2.3 % — ABNORMAL HIGH (ref 0.0–0.2)
nRBC: 4 /100 WBC — ABNORMAL HIGH

## 2018-08-26 LAB — PHOSPHORUS: Phosphorus: 3.8 mg/dL (ref 2.5–4.6)

## 2018-08-26 LAB — BASIC METABOLIC PANEL
Anion gap: 4 — ABNORMAL LOW (ref 5–15)
BUN: 15 mg/dL (ref 6–20)
CO2: 32 mmol/L (ref 22–32)
Calcium: 8.5 mg/dL — ABNORMAL LOW (ref 8.9–10.3)
Chloride: 99 mmol/L (ref 98–111)
Creatinine, Ser: 0.58 mg/dL — ABNORMAL LOW (ref 0.61–1.24)
GFR calc Af Amer: >60 mL/min (ref >60)
GFR calc non Af Amer: >60 mL/min (ref >60)
Glucose, Bld: 152 mg/dL — ABNORMAL HIGH (ref 70–99)
Potassium: 3.5 mmol/L (ref 3.5–5.1)
Sodium: 135 mmol/L (ref 135–145)

## 2018-08-26 LAB — GLUCOSE, CAPILLARY
Glucose-Capillary: 139 mg/dL — ABNORMAL HIGH (ref 70–99)
Glucose-Capillary: 146 mg/dL — ABNORMAL HIGH (ref 70–99)
Glucose-Capillary: 267 mg/dL — ABNORMAL HIGH (ref 70–99)
Glucose-Capillary: 167 mg/dL — ABNORMAL HIGH (ref 70–99)
Glucose-Capillary: 315 mg/dL — ABNORMAL HIGH (ref 70–99)

## 2018-08-26 LAB — HEPARIN LEVEL (UNFRACTIONATED): Heparin Unfractionated: 0.36 IU/mL (ref 0.30–0.70)

## 2018-08-26 LAB — MAGNESIUM: Magnesium: 1.9 mg/dL (ref 1.7–2.4)

## 2018-08-26 MED ORDER — POLYETHYLENE GLYCOL 3350 17 G PO PACK
17.0000 g | PACK | Freq: Every day | ORAL | Status: DC
  Administered 2018-08-26 – 2018-08-27 (×2): 17 g
  Filled 2018-08-26 (×2): qty 1

## 2018-08-26 MED ORDER — APIXABAN 5 MG PO TABS
10.0000 mg | ORAL_TABLET | Freq: Two times a day (BID) | ORAL | Status: DC
  Administered 2018-08-26 – 2018-08-27 (×3): 10 mg via ORAL
  Filled 2018-08-26 (×3): qty 2

## 2018-08-26 MED ORDER — APIXABAN 5 MG PO TABS: ORAL_TABLET | ORAL | 0 refills | Status: DC

## 2018-08-26 MED ORDER — INSULIN GLARGINE 100 UNITS/ML SOLOSTAR PEN: 18.0000 [IU] | PEN_INJECTOR | Freq: Every day | SUBCUTANEOUS | 0 refills | Status: DC

## 2018-08-26 MED ORDER — INSULIN STARTER KIT- PEN NEEDLES (ENGLISH): 1.0000 | Freq: Once | Status: DC

## 2018-08-26 MED ORDER — APIXABAN 5 MG PO TABS: 5.0000 mg | ORAL_TABLET | Freq: Two times a day (BID) | ORAL | Status: DC

## 2018-08-26 MED ORDER — INSULIN ASPART 100 UNIT/ML FLEXPEN: PEN_INJECTOR | SUBCUTANEOUS | 0 refills | Status: DC

## 2018-08-26 MED ORDER — POTASSIUM CHLORIDE 20 MEQ/15ML (10%) PO SOLN
40.0000 meq | Freq: Once | ORAL | Status: AC
  Administered 2018-08-26: 40 meq
  Filled 2018-08-26: qty 30

## 2018-08-26 MED ORDER — "PEN NEEDLES 3/16"" 31G X 5 MM MISC": 0 refills | Status: DC

## 2018-08-26 MED ORDER — INSULIN ASPART 100 UNIT/ML ~~LOC~~ SOLN
0.0000 [IU] | Freq: Three times a day (TID) | SUBCUTANEOUS | Status: DC
  Administered 2018-08-26: 13:00:00 8 [IU] via SUBCUTANEOUS
  Administered 2018-08-26 – 2018-08-27 (×2): 3 [IU] via SUBCUTANEOUS

## 2018-08-26 MED ORDER — INSULIN LISPRO (1 UNIT DIAL) 100 UNIT/ML (KWIKPEN): PEN_INJECTOR | SUBCUTANEOUS | 0 refills | Status: DC

## 2018-08-26 MED FILL — HUMALOG 100 UNITS/ML KWIKPE: 100 | 30 days supply | Qty: 9 | Fill #0

## 2018-08-26 MED FILL — PENTIPS 31G X 8 MM MISC: 31G X 8 MM | 30 days supply | Qty: 100 | Fill #0

## 2018-08-26 MED FILL — ELIQUIS STARTER PACK 5 MG T: 5 | 30 days supply | Qty: 74 | Fill #0

## 2018-08-26 MED FILL — LANTUS SOLOSTAR 100 UNITS/M: 100 | 30 days supply | Qty: 6 | Fill #0

## 2018-08-26 NOTE — Progress Notes (Addendum)
PROGRESS NOTE  [facility] KDX:[id number] DOB: [birth year] DOA: 08/21/2018 PCP: [name], MD  Brief history:  Glenn Goodman is a 58 y.o. male with medical history significant of squamous cell carcinoma of the left tonsil, chemotherapy with induced anemia and thrombocytopenia, penile candidiasis, sensorineural hearing loss, osteoarthritis, restless leg syndrome and obstructive sleep apnea on cpap who was  Sent from oncology's office to the ER due to syncope, acute hypoxia and  new hyperglycemia  HPI/Recap of past 24 hours:  Reports cpap helped him sleep last night, C/o mucositis, hardly able to swallow pills, Has mild nose bleed this am C/o skin lesion on neck due to radiation, no fever  neutropenia appear had reached nadir and slowly going up   Assessment/Plan: Principal Problem:   Hyperglycemia Active Problems:   Squamous cell carcinoma of left tonsil (HCC)   Sensorineural hearing loss (SNHL) of both ears   Oral candidiasis   AKI (acute kidney injury) ([place])   Leukopenia due to antineoplastic chemotherapy (HCC)   Chemotherapy-induced thrombocytopenia   Antineoplastic chemotherapy induced anemia   Dehydration   LFT elevation   Mucositis   Pancytopenia (HCC)   Hypokalemia   Acute pulmonary embolism without acute cor pulmonale (HCC)   Deep vein thrombosis (DVT) of both lower extremities (HCC)   Acute on chronic respiratory failure with hypoxia (HCC)   Saddle embolus of pulmonary artery (HCC)  Hyperglycemia: with mild DKA on presentation (A1c 10.2) Blood glucose 1047 on presentation. Gap closed ( from 16 to 7), blood glucose much improved after insulin drip and ivf Possible cause of hyperglycemia, including tube feeds , recent steroids use, and possible underline diabetes Change tube feeds from osmolites to glucerna, he can not tolerate glucerna due to diarrhea, he wants to change back to osmolite and use more insulin  nutrition input appreciated,  Blood glucose  elevated after changing back to osmolite, lantus and ssi dose adjusted,  Insulin Prescription/pne needles  sent to transitional pharmacy  He reports oncology sent prescription for  glucometer, diabetic testing supplies to [facility] in [place]. He will verify with his local pharmacy prior to discharge.  Acute bilateral PE/with right heart strain, left lower extremity DVT -patient had Near syncope/Acute hypoxic respiratory failure while at the cancer center, rapid response was called, he was brought to the ED -he is started on heparin drip, code PE activated, he underwent catheter directed thrombolysis on 4/3 --critical care and IR input appreciated -oncology oked with  transition to eliquis , transition from heparin drip to eliquis.   notification received from case manager on 4/6 that eliquis requires prior authorization (PA) I have called in PA request on 4/6 at [phone], requested  urgent approval PA# [id number], awaiting approval.  Sent eliquis prescription to transitional pharmacy , meds to be delivered to his room prior to discharge   Hypokalemia:remain low, continue to replace k, repeat lab in am, mag 1.9  AKI; Likely prerenal  Cr 1.33 on presentation, cr normalized, today is 0.58  Elevated lft:  - hepatitis panel negative  - ab [place] "Fatty infiltration of the liver. Gallbladder wall thickening without visible stones or sonographic Murphy sign. This may be related to liver disease or chronic cholecystitis." -he denies ab pain, no n/v -lft improving, elevated lft likely due to liver congestion in the setting of bilateral PE.    Pancytopenia:  possible chemo /XRT related (anemia, thrombocytopenia, leukopenia), last chemo on 3/26, last xrt on 3/31 no fever, does has significant mucositis, blood  culture no grwoth, ua no infection, does not need gcsf  Or abx per oncology. Wbc nadir at 1.1, appear improving hematology/oncology input appreciated  Mucositis: magic mouth  wash/viscous lidocaine , he has been on diflucan at home which is continued. Continue tube feeds for nutrition support  Stage I (pT1pN1M0) left tonsil SCCa, p16+ Plan per oncology Dr Maylon Peppers: -S/p TORS in 05/2018, followed by adjuvant chemoradiation  -Chemoradiation completed on 08/21/2018 -No further treatment planned at this time -We will plan to repeat surveillance scans in 2-3 months for assess for any evidence of residual disease   OSA on cpap at home  Code Status: full  Family Communication: patient and wife over the phone daily  Disposition Plan: home once neutropenia improves , likely on 4/8    Consultants:  Hematology/oncology  IR  Pulmonary /critical care  Procedures:  he underwent catheter directed thrombolysis on 4/3  Antibiotics:  none   Objective: BP 97/74 (BP Location: Left Arm)   Pulse (!) 104   Temp 98.3 F (36.8 C) (Oral)   Resp 18   Ht 5' 8"  (1.727 m)   Wt 76.6 kg   SpO2 97%   BMI 25.68 kg/m   Intake/Output Summary (Last 24 hours) at 08/26/2018 0940 Last data filed at 08/26/2018 0600 Gross per 24 hour  Intake 544.67 ml  Output 1900 ml  Net -1355.33 ml   Filed Weights   08/21/18 1737 08/22/18 0008 08/23/18 1617  Weight: 74.8 kg 77.4 kg 76.6 kg    Exam: Patient is examined daily including today on 08/26/2018, exams remain the same as of yesterday except that has changed    General:  NAD, + mucositis   Cardiovascular: RRR  Respiratory: CTABL  Abdomen: peg tube in place, Soft/ND/NT, positive BS  Musculoskeletal: No Edema  Neuro: alert, oriented   Data Reviewed: Basic Metabolic Panel: Recent Labs  Lab 08/20/18 1408  08/22/18 0523 08/23/18 0508 08/24/18 0351 08/25/18 0318 08/26/18 0342  NA 139   < > 147* 143 137 138 135  K 4.4   < > 3.3* 3.0* 2.9* 3.5 3.5  CL 98   < > 117* 109 106 104 99  CO2 25   < > 23 26 24 26  32  GLUCOSE 850*   < > 130* 131* 121* 125* 152*  BUN 42*   < > 31* 15 14 13 15   CREATININE 1.60*   < > 0.86 0.69  0.62 0.54* 0.58*  CALCIUM 9.5   < > 8.0* 7.6* 8.0* 8.4* 8.5*  MG 2.3  --   --  2.0 1.9 1.9 1.9  PHOS  --   --   --   --  3.3 2.9 3.8   < > = values in this interval not displayed.   Liver Function Tests: Recent Labs  Lab 08/21/18 1817 08/22/18 0523 08/23/18 0508 08/24/18 0351 08/25/18 0318  AST 120* 116* 73* 38 18  ALT 155* 203* 190* 140* 93*  ALKPHOS 134* 94 83 88 96  BILITOT 0.8 0.5 1.0 0.9 0.6  PROT 5.5* 5.1* 4.6* 5.2* 5.0*  ALBUMIN 3.3* 3.0* 2.8* 2.8* 2.8*   Recent Labs  Lab 08/23/18 0508  LIPASE 17   No results for input(s): AMMONIA in the last 168 hours. CBC: Recent Labs  Lab 08/20/18 1602  08/21/18 1817  08/23/18 0507 08/23/18 1044 08/24/18 0351 08/25/18 0318 08/26/18 0342  WBC 2.1*  --  3.4*   < > 1.9* 2.0* 1.3* 1.1* 1.3*  NEUTROABS 1.7  --  3.1  --   --   --  0.9* 0.7* 1.0*  HGB 12.4*   < > 11.8*   < > 10.5* 10.9* 10.8* 10.5* 10.4*  HCT 37.8*   < > 37.2*   < > 32.1* 33.1* 32.6* 31.7* 31.8*  MCV 98.7  --  103.6*   < > 98.5 95.4 96.2 95.8 96.4  PLT 126*  --  105*   < > 78* 83* 86* 110* 128*   < > = values in this interval not displayed.   Cardiac Enzymes:   Recent Labs  Lab 08/22/18 1807 08/22/18 2130 08/23/18 0508 08/24/18 0351  TROPONINI 0.56* 0.70* 0.52* 0.15*   BNP (last 3 results) Recent Labs    08/22/18 1807  BNP 457.1*    ProBNP (last 3 results) No results for input(s): PROBNP in the last 8760 hours.  CBG: Recent Labs  Lab 08/25/18 1629 08/25/18 1741 08/25/18 2106 08/26/18 0240 08/26/18 0848  GLUCAP 57* 161* 250* 139* 146*    Recent Results (from the past 240 hour(s))  Culture, blood (routine x 2)     Status: None (Preliminary result)   Collection Time: 08/25/18  9:32 AM  Result Value Ref Range Status   Specimen Description BLOOD RIGHT ANTECUBITAL  Final   Special Requests   Final    BOTTLES DRAWN AEROBIC ONLY Blood Culture adequate volume   Culture   Final    NO GROWTH < 24 HOURS Performed at [facility],  [place] [address] [place], [place] [address]    Report Status PENDING  Incomplete  Culture, blood (routine x 2)     Status: None (Preliminary result)   Collection Time: 08/25/18  9:37 AM  Result Value Ref Range Status   Specimen Description BLOOD RIGHT HAND  Final   Special Requests   Final    BOTTLES DRAWN AEROBIC ONLY Blood Culture adequate volume   Culture   Final    NO GROWTH < 24 HOURS Performed at [facility], [place] [address] [place], [place] [address]    Report Status PENDING  Incomplete     Studies: No results found.  Scheduled Meds: . clotrimazole  10 mg Oral 5 X Daily  . feeding supplement (OSMOLITE 1.5 CAL)  237 mL Per Tube Q2000  . feeding supplement (OSMOLITE 1.5 CAL)  474 mL Per Tube TID  . feeding supplement (PRO-STAT SUGAR FREE 64)  30 mL Per Tube TID  . fluconazole  100 mg Per Tube Daily  . free water  240 mL Per Tube Q8H  . free water  60 mL Per Tube TID PC & HS  . guaiFENesin  5 mL Per Tube BID  . insulin aspart  0-20 Units Subcutaneous TID WC  . insulin aspart  3 Units Subcutaneous TID WC  . insulin glargine  18 Units Subcutaneous QHS  . insulin starter kit- pen needles  1 kit Other Once  . mouth rinse  15 mL Mouth Rinse BID  . pramipexole  1 mg Per Tube QHS  . senna-docusate  1 tablet Per Tube BID  . silver sulfADIAZINE   Topical BID  . sodium chloride flush  10-40 mL Intracatheter Q12H  . sodium chloride flush  3 mL Intravenous Q12H  . sodium chloride flush  3 mL Intravenous Q12H    Continuous Infusions: . sodium chloride    . sodium chloride    . heparin 1,300 Units/hr (08/26/18 0430)     Time spent: 56mns,  I have personally reviewed and interpreted on  08/26/2018 daily labs, tele strips, imagings as discussed above under date review session and assessment and plans.  I reviewed all nursing notes, pharmacy notes, consultant notes,  vitals, pertinent old records  I have discussed plan of care as described above with RN , patient and family  on 08/26/2018   Glenn Reasons MD, PhD  [facility] Pager [phone]. If 7PM-7AM, please contact night-coverage at [email or web address], password [facility] 08/26/2018, 9:40 AM  LOS: 5 days

## 2018-08-26 NOTE — Progress Notes (Addendum)
Inpatient Diabetes Program Recommendations  AACE/ADA: New Consensus Statement on Inpatient Glycemic Control (2015)  Target Ranges:  Prepandial:   less than 140 mg/dL      Peak postprandial:   less than 180 mg/dL (1-2 hours)      Critically ill patients:  140 - 180 mg/dL   Lab Results  Component Value Date   GLUCAP 146 (H) 08/26/2018   HGBA1C 10.2 (H) 08/23/2018    Review of Glycemic Control Results for Glenn Goodman, Glenn Goodman (MRN [id number]) as of 08/26/2018 10:23  Ref. Range 08/25/2018 13:04 08/25/2018 14:01 08/25/2018 16:29 08/25/2018 17:41 08/25/2018 21:06 08/26/2018 02:40 08/26/2018 08:48  Glucose-Capillary Latest Ref Range: 70 - 99 mg/dL 407 (H) 360 (H) 57 (L) 161 (H) 250 (H) 139 (H) 146 (H)    Inpatient Diabetes Program Recommendations:   Hypoglycemia yesterday afternoon post correction. -Dcrease Novolog correction to moderate  Received consult regarding use of insulin pen. Reviewed with patient over the phone. Patient and wife have viewed insulin pen video and diabetes management and feel comfortable with using pen. Reordered starter kit for pen due to patient unsure where his is @ present. Will check on patient in am to determine if patient has other questions.  Thank you, Nani Gasser. Crystalina Stodghill, RN, MSN, CDE  Diabetes Coordinator Inpatient Glycemic Control Team Team Pager [phone] (8am-5pm) 08/26/2018 10:24 AM

## 2018-08-26 NOTE — Progress Notes (Signed)
RT placed patient on CPAP HS auto. NO O2 bleed in needed.

## 2018-08-26 NOTE — Progress Notes (Addendum)
ANTICOAGULATION CONSULT NOTE  Pharmacy Consult:  Heparin Indication: pulmonary embolus  No Known Allergies  Patient Measurements: Height: 5\' 8"  (172.7 cm) Weight: 168 lb 14 oz (76.6 kg) IBW/kg (Calculated) : 68.4  Vital Signs: Temp: 98.3 F (36.8 C) (04/07 0433) Temp Source: Oral (04/07 0433) BP: 97/74 (04/07 0433) Pulse Rate: 104 (04/07 0433)  Labs: Recent Labs    08/24/18 0351  08/25/18 0318 08/25/18 1554 08/26/18 0342  HGB 10.8*  --  10.5*  --  10.4*  HCT 32.6*  --  31.7*  --  31.8*  PLT 86*  --  110*  --  128*  LABPROT 14.1  --   --   --   --   INR 1.1  --   --   --   --   HEPARINUNFRC 0.29*   < > 0.27* 0.32 0.36  CREATININE 0.62  --  0.54*  --  0.58*  TROPONINI 0.15*  --   --   --   --    < > = values in this interval not displayed.    Estimated Creatinine Clearance: 96.2 mL/min (A) (by C-G formula based on SCr of 0.58 mg/dL (L)).   Assessment: 58 y/o male SCC of left tonsil on chemotherapy who presented to the ED with near syncope and hyperglycemia. CTA chest shows bilateral PE with right heart strain. LLE duplex noted for DVT. Pharmacy consulted to dose IV heparin.  He is s/p EKOS.  Heparin level therapeutic at 0.36, H/H stable, plts improved to 128.  Goal of Therapy:  Heparin level 0.3-0.7 units/ml Monitor platelets by anticoagulation protocol: Yes   Plan:  Continue heparin gtt at 1300 units/hr Daily heparin level, CBC, s/s bleeding F/u transition to oral [facility]  Bertis Ruddy, PharmD Clinical Pharmacist Please check AMION for all [place] numbers 08/26/2018 8:01 AM   Addendum: Now to transition to Eliquis D/c heparin gtt, and give Eliquis 10mg  PO BID x 7d, then 5mg  PO BID thereafter

## 2018-08-27 ENCOUNTER — Telehealth: Payer: Self-pay | Admitting: *Deleted

## 2018-08-27 ENCOUNTER — Inpatient Hospital Stay: Payer: Commercial Managed Care - PPO

## 2018-08-27 ENCOUNTER — Inpatient Hospital Stay: Payer: Commercial Managed Care - PPO | Admitting: Hematology

## 2018-08-27 DIAGNOSIS — B370 Candidal stomatitis: Secondary | ICD-10-CM

## 2018-08-27 DIAGNOSIS — B3742 Candidal balanitis: Secondary | ICD-10-CM

## 2018-08-27 LAB — CBC WITH DIFFERENTIAL/PLATELET
Abs Immature Granulocytes: 0.1 10*3/uL — ABNORMAL HIGH (ref 0.00–0.07)
Basophils Absolute: 0 10*3/uL (ref 0.0–0.1)
Basophils Relative: 0 %
Eosinophils Absolute: 0 10*3/uL (ref 0.0–0.5)
Eosinophils Relative: 1 %
HCT: 32.9 % — ABNORMAL LOW (ref 39.0–52.0)
Hemoglobin: 10.7 g/dL — ABNORMAL LOW (ref 13.0–17.0)
Lymphocytes Relative: 14 %
Lymphs Abs: 0.2 10*3/uL — ABNORMAL LOW (ref 0.7–4.0)
MCH: 31.4 pg (ref 26.0–34.0)
MCHC: 32.5 g/dL (ref 30.0–36.0)
MCV: 96.5 fL (ref 80.0–100.0)
Metamyelocytes Relative: 3 %
Monocytes Absolute: 0.2 10*3/uL (ref 0.1–1.0)
Monocytes Relative: 13 %
Myelocytes: 1 %
Neutro Abs: 1.2 10*3/uL — ABNORMAL LOW (ref 1.7–7.7)
Neutrophils Relative %: 68 %
Platelets: 152 10*3/uL (ref 150–400)
RBC: 3.41 MIL/uL — ABNORMAL LOW (ref 4.22–5.81)
RDW: 16.7 % — ABNORMAL HIGH (ref 11.5–15.5)
WBC: 1.7 10*3/uL — ABNORMAL LOW (ref 4.0–10.5)
nRBC: 2.4 % — ABNORMAL HIGH (ref 0.0–0.2)
nRBC: 3 /100 WBC — ABNORMAL HIGH

## 2018-08-27 LAB — COMPREHENSIVE METABOLIC PANEL
ALT: 55 U/L — ABNORMAL HIGH (ref 0–44)
AST: 16 U/L (ref 15–41)
Albumin: 3 g/dL — ABNORMAL LOW (ref 3.5–5.0)
Alkaline Phosphatase: 90 U/L (ref 38–126)
Anion gap: 7 (ref 5–15)
BUN: 20 mg/dL (ref 6–20)
CO2: 29 mmol/L (ref 22–32)
Calcium: 8.9 mg/dL (ref 8.9–10.3)
Chloride: 97 mmol/L — ABNORMAL LOW (ref 98–111)
Creatinine, Ser: 0.75 mg/dL (ref 0.61–1.24)
GFR calc Af Amer: >60 mL/min (ref >60)
GFR calc non Af Amer: >60 mL/min (ref >60)
Glucose, Bld: 243 mg/dL — ABNORMAL HIGH (ref 70–99)
Potassium: 4.2 mmol/L (ref 3.5–5.1)
Sodium: 133 mmol/L — ABNORMAL LOW (ref 135–145)
Total Bilirubin: 0.6 mg/dL (ref 0.3–1.2)
Total Protein: 5.7 g/dL — ABNORMAL LOW (ref 6.5–8.1)

## 2018-08-27 LAB — GLUCOSE, CAPILLARY
Glucose-Capillary: 178 mg/dL — ABNORMAL HIGH (ref 70–99)
Glucose-Capillary: 352 mg/dL — ABNORMAL HIGH (ref 70–99)

## 2018-08-27 LAB — MAGNESIUM: Magnesium: 2.1 mg/dL (ref 1.7–2.4)

## 2018-08-27 MED ORDER — SORBITOL 70 % SOLN
960.0000 mL | TOPICAL_OIL | Freq: Once | ORAL | Status: DC
  Filled 2018-08-27: qty 473

## 2018-08-27 MED ORDER — BLOOD GLUCOSE MONITOR KIT: PACK | 0 refills | Status: DC

## 2018-08-27 MED ORDER — HEPARIN SOD (PORK) LOCK FLUSH 100 UNIT/ML IV SOLN
500.0000 [IU] | INTRAVENOUS | Status: AC | PRN
  Administered 2018-08-27: 500 [IU]

## 2018-08-27 MED ORDER — SODIUM FLUORIDE 1.1 % DT CREA: 1.0000 "application " | TOPICAL_CREAM | Freq: Every day | DENTAL | Status: DC

## 2018-08-27 MED ORDER — FLUCONAZOLE 100 MG PO TABS: 100.0000 mg | ORAL_TABLET | Freq: Every day | ORAL | 0 refills | Status: AC

## 2018-08-27 MED ORDER — INSULIN GLARGINE 100 UNITS/ML SOLOSTAR PEN: 18.0000 [IU] | PEN_INJECTOR | Freq: Every day | SUBCUTANEOUS | 0 refills | Status: DC

## 2018-08-27 NOTE — Discharge Summary (Signed)
Glenn Goodman [facility], is a 58 y.o. male  DOB [birth year]  MRN [id number].  Admission date:  08/21/2018  Admitting Physician  Elwyn Reach, MD  Discharge Date:  08/27/2018   Primary MD  [name], MD  Recommendations for primary care physician for things to follow:    Discharge Diagnosis   Principal Problem:   Hyperglycemia Active Problems:   Squamous cell carcinoma of left tonsil (HCC)   Sensorineural hearing loss (SNHL) of both ears   Oral candidiasis   AKI (acute kidney injury) ([place])   Leukopenia due to antineoplastic chemotherapy (HCC)   Chemotherapy-induced thrombocytopenia   Antineoplastic chemotherapy induced anemia   Dehydration   LFT elevation   Mucositis   Pancytopenia (HCC)   Hypokalemia   Acute pulmonary embolism without acute cor pulmonale (HCC)   Deep vein thrombosis (DVT) of both lower extremities (HCC)   Acute on chronic respiratory failure with hypoxia (HCC)   Saddle embolus of pulmonary artery (HCC)      Past Medical History:  Diagnosis Date   Arthritis    Cancer ([place])    OSA on CPAP    Restless leg syndrome     Past Surgical History:  Procedure Laterality Date   DIRECT LARYNGOSCOPY Left 04/22/2018   Dr. Janace Hoard   FACIAL FRACTURE SURGERY Left 1986   IR ANGIOGRAM PULMONARY BILATERAL SELECTIVE  08/22/2018   IR ANGIOGRAM SELECTIVE EACH ADDITIONAL VESSEL  08/22/2018   IR ANGIOGRAM SELECTIVE EACH ADDITIONAL VESSEL  08/22/2018   IR GASTROSTOMY TUBE MOD SED  06/30/2018   IR IMAGING GUIDED PORT INSERTION  06/30/2018   IR INFUSION THROMBOL ARTERIAL INITIAL (MS)  08/22/2018   IR INFUSION THROMBOL ARTERIAL INITIAL (MS)  08/22/2018   IR THROMB F/U EVAL ART/VEN FINAL DAY (MS)  08/23/2018   IR US GUIDE VASC ACCESS RIGHT  08/22/2018   MODIFIED RADICAL NECK DISSECTION Left 05/26/2018   Dr. Nicolette Bang,  [facility]   transoral robotic surgery  05/26/2018   TORS, Dr. Nicolette Bang [facility]       HPI  from the history and physical done on the day of admission:    Glenn Goodman is a 58 y.o. male with medical history significant of squamous cell carcinoma of the left tonsil, chemotherapy with induced anemia and thrombocytopenia, penile candidiasis, sensorineural hearing loss, osteoarthritis, restless leg syndrome and obstructive sleep apnea who was seen in the ER only yesterday with new hyperglycemia.  Patient was not in nondiabetic however he has been receiving Decadron with his chemotherapy.  His last chemo was last week.  He was found to have blood sugar of over thousand.  Patient was initiated on Lantus and metformin only yesterday.  He came back due to worsening blood sugar today.  He has not been taking any steroids.  He called his oncologist who sent him to the ER.  Patient has had near syncope today.  He was visibly dehydrated.  Denied any shortness of breath or cough.  Denied any exposure to anyone with significant disease.  His blood  sugar was more than 8000 but not acidotic.  Is being admitted with hyperosmolar nonketotic hyperglycemia..  ED Course: Temperature is 97.3 with blood pressure 147/131 pulse 139 respirate of 29 initial oxygen sat 86% on room air.  His white count of 3.4 hemoglobin 11.8.  Platelets 105.  Chemistry showed blood sugar 1047 creatinine 1.33 gap of 14.  LFTs elevated with AST 120 ALT 155 alkaline phosphatase 134 and total bilirubin 0.8.  Patient was initiated on IV insulin, IV fluids and is being admitted with hyperglycemia.    Hospital Course:  1.  Hyperglycemia: with mild DKA on presentation (A1c 10.2) Blood glucose 1047 on presentation. Gap closed ( from 16 to 7), blood glucose much improved after insulin drip and IV fluids Possible cause of hyperglycemia, including tube feeds , recent steroids use, and possible underline diabetes Change tube feeds from osmolites to glucerna,  however he could not tolerate glucerna due to diarrhea, and was change back to osmolite. Blood glucose elevated after changing back to osmolite, lantus and ssi dose adjusted,  Insulin Prescription/pne needles  sent to transitional pharmacy   Diabetic education consulted and gave patient freestyle glucose meter and tester, but unsure if insurance will cover this.  Therefore a prescription for  glucometer, diabetic testing supplies given to patient in hand.  2.  Acute bilateral PE/with right heart strain, left lower extremity DVT -patient had Near syncope/Acute hypoxic respiratory failure while at the cancer center, rapid response was called, he was brought to the ED -he is started on heparin drip, code PE activated, he underwent catheter directed thrombolysis on 4/3 --critical care and IR input appreciated -oncology oked with  transition to eliquis , transition from heparin drip to eliquis.  Notification received from case manager on 4/6 that eliquis requires prior authorization (PA) I have called in PA request on 4/6 at [phone], requested  urgent approval PA# [id number], awaiting approval.  Sent eliquis prescription to transitional pharmacy , meds were delivered to bedside prior to discharge   3.  Hypokalemia: Repleted.  Potassium 4.2 prior to discharge  4.  Acute kidney injury: Resolved. -Likely prerenal  -Cr 1.33 on presentation, cr normalized,  prior to discharge 0.75  5.  Elevated liver function tests: - hepatitis panel negative  - ab [place] "Fatty infiltration of the liver. Gallbladder wall thickening without visible stones or sonographic Murphy sign. This may be related to liver disease or chronic cholecystitis." -he denies ab pain, no n/v -Liver function studies improving improving, elevated lft likely due to liver congestion in the setting of bilateral PE.  X.  Pancytopenia: Suspect chemo /XRT related (anemia, thrombocytopenia, leukopenia), last chemo on 3/26, last xrt on  3/31.  No fever noted during hospitalization.physical exam did show significant mucositis. Blood cultures showed no growth, and urinalysis showed no signs of infection.  Patient did not does not need gcsf or abx per oncology. Wbc nadir at 1.7, appear improving hematology/oncology input appreciated   7.  Mucositis: Unchanged. magic mouth wash/viscous lidocaine. He has been on diflucan at home which is continued to complete 7-day course. -Continue tube feeds for nutrition support -Discussed advances and  8.  Stage I (pT1pN1M0) left tonsil SCCa, p16+ Plan per oncology Dr Maylon Peppers: -S/p TORS in 05/2018, followed by adjuvant chemoradiation  -Chemoradiation completed on 08/21/2018 -No further treatment planned at this time -Will plan to repeat surveillance scans in 2-3 months for assess for any evidence of residual disease -Recommend continue outpatient follow-up with oncology  9.  OSA on  cpap at home  10.  Constipation: Patient was placed on stool softeners/laxatives without relief of symptoms during hospital stay.  Enema was offered, but patient declined as he feared having to have a bowel movement as he was going home.  Recommended patient to continue home laxatives/stool softeners  Follow UP  Follow-up Information    [name], MD Follow up.   Specialty:  Family Medicine Why:  hospital discharge follow up, pcp to monitor blood glucose control Contact information: [address] [place] [address] [phone]        Tish Men, MD Follow up.   Specialties:  Hematology, Oncology Contact information: [place] [address] [phone]            Consults obtained:  Hematology/oncology  IR  Pulmonary /critical care  Discharge Condition: Stable  Diet and Activity recommendation: See Discharge Instructions below  Discharge Instructions    Ambulatory referral to Nutrition and Diabetic Education   Complete by:  As directed     Discharge instructions   Complete by:  As directed    Follow with Primary [name], MD in 7-10 days.  It is important that you check your blood sugars 3 times daily prior to meals and at bedtime keeping a log of the glucose readings to show your primary care doctor.  You will need to continue on anticoagulation until advised to stop by your primary doctor.  Do not take NSAIDs as this can put you at risk for bleeding.  If you have any signs of bleeding/very dark stools you should call your doctor or come to the hospital immediately for further evaluation.  Get CBC and BMP -  checked  by Primary MD or SNF MD in 5-7 days ( we routinely change or add medications that can affect your baseline labs and fluid status, therefore we recommend that you get the mentioned basic workup next visit with your PCP, your PCP may decide not to get them or add new tests based on their clinical decision)  Activity: Advance activity as tolerated  Disposition: Home  Diet: Carb modified diet  Special Instructions: If you have smoked or chewed Tobacco  in the last 2 yrs please stop smoking, stop any regular Alcohol  and or any Recreational drug use.  On your next visit with your primary care physician please Get Medicines reviewed and adjusted.  Please request your [name], MD to go over all Hospital Tests and Procedure/Radiological results at the follow up, please get all Hospital records sent to your Prim MD by signing hospital release before you go home.  If you experience worsening of your admission symptoms, develop shortness of breath, life threatening emergency, suicidal or homicidal thoughts you must seek medical attention immediately by calling 911 or calling your MD immediately  if symptoms less severe.  You Must read complete instructions/literature along with all the possible adverse reactions/side effects for all the Medicines you take and that have been prescribed to you. Take any new Medicines  after you have completely understood and accpet all the possible adverse reactions/side effects.   Do not drive, operate heavy machinery, perform activities at heights, swimming or participation in water activities or provide baby sitting services if your were admitted for syncope or siezures until you have seen by Primary MD or a Neurologist and advised to do so again.  Do not drive when taking Pain medications.  Do not take more than prescribed Pain, Sleep and Anxiety  Medications  Wear Seat belts while driving.   Please note  You were cared for by a hospitalist during your hospital stay. If you have any questions about your discharge medications or the care you received while you were in the hospital after you are discharged, you can call the unit and asked to speak with the hospitalist on call if the hospitalist that took care of you is not available. Once you are discharged, your primary care physician will handle any further medical issues. Please note that NO REFILLS for any discharge medications will be authorized once you are discharged, as it is imperative that you return to your primary care physician (or establish a relationship with a primary care physician if you do not have one) for your aftercare needs so that they can reassess your need for medications and monitor your lab values.   Increase activity slowly   Complete by:  As directed         Discharge Medications     Allergies as of 08/27/2018   No Known Allergies     Medication List    STOP taking these medications   dexamethasone 4 MG tablet Commonly known as:  DECADRON   lidocaine-prilocaine cream Commonly known as:  EMLA   phenazopyridine 200 MG tablet Commonly known as:  PYRIDIUM   prochlorperazine 10 MG tablet Commonly known as:  COMPAZINE     TAKE these medications   apixaban 5 MG Tabs tablet Commonly known as:  ELIQUIS 41m po bid for 7 days then 566mpo bid , for 30days supply.   bisacodyl 5 MG EC  tablet Commonly known as:  DULCOLAX Take 5 mg by mouth 2 (two) times daily.   blood glucose meter kit and supplies Kit Dispense based on patient and insurance preference. Use up to four times daily as directed. (FOR ICD-9 250.00, 250.01). What changed:  additional instructions   clotrimazole 10 MG troche Commonly known as:  MYCELEX Take 1 lozenge (10 mg total) by mouth 5 (five) times daily for 30 days.   fluconazole 100 MG tablet Commonly known as:  DIFLUCAN Take 1 tablet (100 mg total) by mouth daily for 3 days.   insulin glargine 100 unit/mL Sopn Commonly known as:  LANTUS Inject 0.18 mLs (18 Units total) into the skin at bedtime for 30 days. What changed:    how much to take  when to take this   insulin lispro 100 UNIT/ML KwikPen Commonly known as:  HumaLOG KwikPen Before each meal 3 times a day 140-199 - 2 units; 200-250  4units; 251-299  6 units;  300-349 - 8 units; 350 or above 10 units.   lidocaine 2 % solution Commonly known as:  XYLOCAINE Patient: Mix 1part 2% viscous lidocaine, 1part H20. Swish & swallow 1018mf diluted mixture, 4m60mefore meals and at bedtime, up to QID What changed:    how much to take  how to take this  when to take this  reasons to take this  additional instructions   LORazepam 0.5 MG tablet Commonly known as:  Ativan Take 1 tablet (0.5 mg total) by mouth every 6 (six) hours as needed (Nausea or vomiting).   metFORMIN 500 MG tablet Commonly known as:  GLUCOPHAGE Take 1 tablet (500 mg total) by mouth 2 (two) times daily with a meal.   morphine 15 MG tablet Commonly known as:  MSIR Take 1 tablet (15 mg total) by mouth every 4 (four) hours as needed for severe pain.  NEOSPORIN EX Apply 1 application topically daily as needed (neck wound).   ondansetron 8 MG tablet Commonly known as:  Zofran Take 1 tablet (8 mg total) by mouth 2 (two) times daily as needed. Start on the third day after chemotherapy. What changed:  reasons to  take this   Pen Needles 3/16" 31G X 5 MM Misc For insulin injection.   pramipexole 1 MG tablet Commonly known as:  MIRAPEX Take 1 mg by mouth at bedtime.   ProMod Liqd Add 30 mL Promod TID. Continue 7 bottles Osmolite 1.5 daily. What changed:    how much to take  how to take this  when to take this  additional instructions   feeding supplement (OSMOLITE 1.5 CAL) Liqd Give 1.5 bottles Osmolite 1.5 Bid via PEG and 2 bottles Osmolite 1.5 via PEG for total of 7 bottles daily.  Give 60 cc free water before and after bolus feedings 4 times daily.  In addition consume or give via PEG 240 mL free water 3 times daily.  Please send formula and split gauze to patient.  He does not need other supplies at this time. What changed:    how much to take  how to take this  when to take this  additional instructions   silver sulfADIAZINE 1 % cream Commonly known as:  SILVADENE Apply 1 application topically 2 (two) times daily.   sodium fluoride 1.1 % Crea dental cream Commonly known as:  PreviDent 5000 Plus Place 1 application onto teeth at bedtime. Apply to tooth brush. Brush teeth for 2 minutes. Spit out excess. DO NOT rinse afterwards. Repeat nightly.   SONAFINE EX Apply 1 application topically daily as needed (neck wound).       Major procedures and Radiology Reports - PLEASE review detailed and final reports for all details, in brief -     Ct Angio Chest Pe W Or Wo Contrast  Addendum Date: 08/22/2018   ADDENDUM REPORT: 08/22/2018 15:55 ADDENDUM: Critical Value/emergent results were called by telephone at the time of interpretation on 08/22/2018 at 3:52 p.m. to Dr. Florencia Reasons , who verbally acknowledged these results. Electronically Signed   By: Marin Olp M.D.   On: 08/22/2018 15:55   Result Date: 08/22/2018 CLINICAL DATA:  Patient received treatment for head neck cancer. Hyperglycemic. Shortness of breath. Assess for pulmonary embolism. EXAM: CT ANGIOGRAPHY CHEST WITH CONTRAST  TECHNIQUE: Multidetector CT imaging of the chest was performed using the standard protocol during bolus administration of intravenous contrast. Multiplanar CT image reconstructions and MIPs were obtained to evaluate the vascular anatomy. CONTRAST:  70 mL OMNIPAQUE IOHEXOL 350 MG/ML SOLN COMPARISON:  Chest CT 07/07/2010 and PET-CT 04/29/2018 FINDINGS: Cardiovascular: Right IJ Port-A-Cath with tip over the cavoatrial junction. Heart is normal size. Thoracic aorta is within normal. Pulmonary arteries are well opacified and demonstrate moderate burden of emboli over the bilateral main pulmonary arteries with small saddle embolus component and emboli extending into the proximal bilateral lobar arteries. Evidence of right heart strain with RV/LV ratio of 52.7/25.8 = 2.0. Remaining vascular structures are unremarkable. Mediastinum/Nodes: No evidence of mediastinal or hilar adenopathy. Remaining mediastinal structures are normal. Lungs/Pleura: Lungs are adequately inflated. There is a small right pleural effusion with minimal associated basilar atelectasis. Airways are normal. Upper Abdomen: No acute findings. Musculoskeletal: No acute findings. Review of the MIP images confirms the above findings. IMPRESSION: Moderate burden bilateral pulmonary emboli within the main pulmonary arteries with small saddle embolus component as emboli extends into the proximal  bilateral lobar arteries. Right heart strain with LV ratio of 2.0. Positive for acute PE with CT evidence of right heart strain (RV/LV Ratio = 2.0) consistent with at least submassive (intermediate risk) PE. The presence of right heart strain has been associated with an increased risk of morbidity and mortality. Please activate Code PE by paging [phone]. Small right pleural effusion with minimal associated right basilar atelectasis. Electronically Signed: By: Marin Olp M.D. On: 08/22/2018 15:41   Ir Angiogram Pulmonary Bilateral Selective  Result Date:  08/23/2018 INDICATION: History of head neck cancer, now with bilateral sub massive pulmonary embolism. Request made for placement of bilateral infusion catheters for the initiation pulmonary arterial thrombolysis EXAM: 1. ULTRASOUND GUIDANCE FOR VENOUS ACCESS X2 2. PULMONARY ARTERIOGRAPHY 3. FLUOROSCOPIC GUIDED PLACEMENT OF BILATERAL PULMONARY ARTERIAL LYTIC INFUSION CATHETERS COMPARISON:  Chest CTA-08/22/2018 MEDICATIONS: None ANESTHESIA/SEDATION: Moderate (conscious) sedation was employed during this procedure. A total of Versed 1 mg and Fentanyl 50 mcg was administered intravenously. Moderate Sedation Time: 40 minutes. The patient's level of consciousness and vital signs were monitored continuously by radiology nursing throughout the procedure under my direct supervision. CONTRAST:  10 mL OMNIPAQUE IOHEXOL 300 MG/ML  SOLN FLUOROSCOPY TIME:  3 minutes, 36 seconds (24 mGy) COMPLICATIONS: None immediate. TECHNIQUE: Informed written consent was obtained from the patient after a discussion of the risks, benefits and alternatives to treatment. Questions regarding the procedure were encouraged and answered. A timeout was performed prior to the initiation of the procedure. Ultrasound scanning was performed of the right groin and demonstrated wide patency of the right common femoral vein. As such, the right common femoral vein was selected venous access. The right groin was prepped and draped in the usual sterile fashion, and a sterile drape was applied covering the operative field. Maximum barrier sterile technique with sterile gowns and gloves were used for the procedure. A timeout was performed prior to the initiation of the procedure. Local anesthesia was provided with 1% lidocaine. Under direct ultrasound guidance, the right common femoral vein was accessed with a micro puncture kit ultimately allowing placement of a 6 French, 35 cm vascular sheath. Slightly caudal to this initial access, the right common femoral was  again accessed with an additional micropuncture kit ultimately allowing placement of an additional 6 French, 35 cm vascular sheath. Ultrasound images were saved for procedural documentation purposes. With the use of a glidewire, a vertebral catheter was advanced into the main pulmonary artery and a limited central pulmonary arteriogram was performed. Pressure measurements were attempted to be obtained from the level the main pulmonary artery however this ultimately proved unsuccessful secondary to measurement device malfunction. The vertebral catheter was advanced into the distal branch of the left lower lobe pulmonary artery. Limited contrast injection confirmed appropriate positioning. Over an exchange length Rosen wire, the vertebral catheter was exchanged for a 105/12 cm multi side-hole EKOS ultrasound assisted infusion catheter. With the use of a glidewire, a vertebral catheter was advanced into a distal branch of the right lower lobe pulmonary artery. Limited contrast injection confirmed appropriate positioning. Over an exchange length Rosen wire, the pigtail catheter was exchanged for a 105/18 cm multi side-hole EKOS ultrasound assisted infusion catheter. A postprocedural fluoroscopic image was obtained to document final catheter positioning. The external catheter tubing was secured at the right thigh and the lytic therapy was initiated. The patient tolerated the procedure well without immediate postprocedural complication. FINDINGS: Limited central pulmonary arteriogram was performed to document appropriate catheter positioning. As above, pressure measurements unable to  be obtained secondary to pressure measurement device malfunction. Following the procedure, both ultrasound assisted infusion catheter tips terminate within the distal aspects of the bilateral lower lobe sub segmental pulmonary arteries. IMPRESSION: Successful fluoroscopic guided initiation of bilateral ultrasound assisted catheter directed  pulmonary arterial lysis for sub massive pulmonary embolism and right-sided heart strain. PLAN: - Will proceed with 12 hour bilateral pulmonary arterial catheter directed thrombolysis. - Will discussed appropriateness of placement of an IVC filter in the setting of left lower extremity DVT with providing ICU attending prior to removal of infusion catheters. Electronically Signed   By: Sandi Mariscal M.D.   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 Request made for placement of bilateral infusion catheters for the initiation pulmonary arterial thrombolysis EXAM: 1. ULTRASOUND GUIDANCE FOR VENOUS ACCESS X2 2. PULMONARY ARTERIOGRAPHY 3. FLUOROSCOPIC GUIDED PLACEMENT OF BILATERAL PULMONARY ARTERIAL LYTIC INFUSION CATHETERS COMPARISON:  Chest CTA-08/22/2018 MEDICATIONS: None ANESTHESIA/SEDATION: Moderate (conscious) sedation was employed during this procedure. A total of Versed 1 mg and Fentanyl 50 mcg was administered intravenously. Moderate Sedation Time: 40 minutes. The patient's level of consciousness and vital signs were monitored continuously by radiology nursing throughout the procedure under my direct supervision. CONTRAST:  10 mL OMNIPAQUE IOHEXOL 300 MG/ML  SOLN FLUOROSCOPY TIME:  3 minutes, 36 seconds (24 mGy) COMPLICATIONS: None immediate. TECHNIQUE: Informed written consent was obtained from the patient after a discussion of the risks, benefits and alternatives to treatment. Questions regarding the procedure were encouraged and answered. A timeout was performed prior to the initiation of the procedure. Ultrasound scanning was performed of the right groin and demonstrated wide patency of the right common femoral vein. As such, the right common femoral vein was selected venous access. The right groin was prepped and draped in the usual sterile fashion, and a sterile  drape was applied covering the operative field. Maximum barrier sterile technique with sterile gowns and gloves were used for the procedure. A timeout was performed prior to the initiation of the procedure. Local anesthesia was provided with 1% lidocaine. Under direct ultrasound guidance, the right common femoral vein was accessed with a micro puncture kit ultimately allowing placement of a 6 French, 35 cm vascular sheath. Slightly caudal to this initial access, the right common femoral was again accessed with an additional micropuncture kit ultimately allowing placement of an additional 6 French, 35 cm vascular sheath. Ultrasound images were saved for procedural documentation purposes. With the use of a glidewire, a vertebral catheter was advanced into the main pulmonary artery and a limited central pulmonary arteriogram was performed. Pressure measurements were attempted to be obtained from the level the main pulmonary artery however this ultimately proved unsuccessful secondary to measurement device malfunction. The vertebral catheter was advanced into the distal branch of the left lower lobe pulmonary artery. Limited contrast injection confirmed appropriate positioning. Over an exchange length Rosen wire, the vertebral catheter was exchanged for a 105/12 cm multi side-hole EKOS ultrasound assisted infusion catheter. With the use of a glidewire, a vertebral catheter was advanced into a distal branch of the right lower lobe pulmonary artery. Limited contrast injection confirmed appropriate positioning. Over an exchange length Rosen wire, the pigtail catheter was exchanged for a 105/18 cm multi side-hole EKOS ultrasound assisted infusion catheter. A postprocedural fluoroscopic image was obtained to document final catheter positioning. The external catheter tubing was secured at the right thigh and the  lytic therapy was initiated. The patient tolerated the procedure well without immediate postprocedural  complication. FINDINGS: Limited central pulmonary arteriogram was performed to document appropriate catheter positioning. As above, pressure measurements unable to be obtained secondary to pressure measurement device malfunction. Following the procedure, both ultrasound assisted infusion catheter tips terminate within the distal aspects of the bilateral lower lobe sub segmental pulmonary arteries. IMPRESSION: Successful fluoroscopic guided initiation of bilateral ultrasound assisted catheter directed pulmonary arterial lysis for sub massive pulmonary embolism and right-sided heart strain. PLAN: - Will proceed with 12 hour bilateral pulmonary arterial catheter directed thrombolysis. - Will discussed appropriateness of placement of an IVC filter in the setting of left lower extremity DVT with providing ICU attending prior to removal of infusion catheters. Electronically Signed   By: Sandi Mariscal M.D.   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 Request made for placement of bilateral infusion catheters for the initiation pulmonary arterial thrombolysis EXAM: 1. ULTRASOUND GUIDANCE FOR VENOUS ACCESS X2 2. PULMONARY ARTERIOGRAPHY 3. FLUOROSCOPIC GUIDED PLACEMENT OF BILATERAL PULMONARY ARTERIAL LYTIC INFUSION CATHETERS COMPARISON:  Chest CTA-08/22/2018 MEDICATIONS: None ANESTHESIA/SEDATION: Moderate (conscious) sedation was employed during this procedure. A total of Versed 1 mg and Fentanyl 50 mcg was administered intravenously. Moderate Sedation Time: 40 minutes. The patient's level of consciousness and vital signs were monitored continuously by radiology nursing throughout the procedure under my direct supervision. CONTRAST:  10 mL OMNIPAQUE IOHEXOL 300 MG/ML  SOLN FLUOROSCOPY TIME:  3 minutes, 36 seconds (24 mGy) COMPLICATIONS: None immediate. TECHNIQUE: Informed written  consent was obtained from the patient after a discussion of the risks, benefits and alternatives to treatment. Questions regarding the procedure were encouraged and answered. A timeout was performed prior to the initiation of the procedure. Ultrasound scanning was performed of the right groin and demonstrated wide patency of the right common femoral vein. As such, the right common femoral vein was selected venous access. The right groin was prepped and draped in the usual sterile fashion, and a sterile drape was applied covering the operative field. Maximum barrier sterile technique with sterile gowns and gloves were used for the procedure. A timeout was performed prior to the initiation of the procedure. Local anesthesia was provided with 1% lidocaine. Under direct ultrasound guidance, the right common femoral vein was accessed with a micro puncture kit ultimately allowing placement of a 6 French, 35 cm vascular sheath. Slightly caudal to this initial access, the right common femoral was again accessed with an additional micropuncture kit ultimately allowing placement of an additional 6 French, 35 cm vascular sheath. Ultrasound images were saved for procedural documentation purposes. With the use of a glidewire, a vertebral catheter was advanced into the main pulmonary artery and a limited central pulmonary arteriogram was performed. Pressure measurements were attempted to be obtained from the level the main pulmonary artery however this ultimately proved unsuccessful secondary to measurement device malfunction. The vertebral catheter was advanced into the distal branch of the left lower lobe pulmonary artery. Limited contrast injection confirmed appropriate positioning. Over an exchange length Rosen wire, the vertebral catheter was exchanged for a 105/12 cm multi side-hole EKOS ultrasound assisted infusion catheter. With the use of a glidewire, a vertebral catheter was advanced into a distal branch of the right  lower lobe pulmonary artery. Limited contrast injection confirmed appropriate positioning. Over an exchange length Rosen wire, the pigtail catheter was exchanged for  a 105/18 cm multi side-hole EKOS ultrasound assisted infusion catheter. A postprocedural fluoroscopic image was obtained to document final catheter positioning. The external catheter tubing was secured at the right thigh and the lytic therapy was initiated. The patient tolerated the procedure well without immediate postprocedural complication. FINDINGS: Limited central pulmonary arteriogram was performed to document appropriate catheter positioning. As above, pressure measurements unable to be obtained secondary to pressure measurement device malfunction. Following the procedure, both ultrasound assisted infusion catheter tips terminate within the distal aspects of the bilateral lower lobe sub segmental pulmonary arteries. IMPRESSION: Successful fluoroscopic guided initiation of bilateral ultrasound assisted catheter directed pulmonary arterial lysis for sub massive pulmonary embolism and right-sided heart strain. PLAN: - Will proceed with 12 hour bilateral pulmonary arterial catheter directed thrombolysis. - Will discussed appropriateness of placement of an IVC filter in the setting of left lower extremity DVT with providing ICU attending prior to removal of infusion catheters. Electronically Signed   By: Sandi Mariscal M.D.   On: 08/23/2018 08:38   US Abdomen Limited  Result Date: 08/22/2018 CLINICAL DATA:  Elevated LFTs EXAM: ULTRASOUND ABDOMEN LIMITED RIGHT UPPER QUADRANT COMPARISON:  None. FINDINGS: Gallbladder: No visible gallstones. Gallbladder wall is mildly thickened at 6 mm. Negative sonographic Murphy's. Common bile duct: Diameter: Normal caliber, 3 mm Liver: Increased echotexture compatible with fatty infiltration. No focal abnormality or biliary ductal dilatation. Portal vein is patent on color Doppler imaging with normal direction of blood  flow towards the liver. IMPRESSION: Fatty infiltration of the liver. Gallbladder wall thickening without visible stones or sonographic Murphy sign. This may be related to liver disease or chronic cholecystitis. Electronically Signed   By: Rolm Baptise M.D.   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 Request made for placement of bilateral infusion catheters for the initiation pulmonary arterial thrombolysis EXAM: 1. ULTRASOUND GUIDANCE FOR VENOUS ACCESS X2 2. PULMONARY ARTERIOGRAPHY 3. FLUOROSCOPIC GUIDED PLACEMENT OF BILATERAL PULMONARY ARTERIAL LYTIC INFUSION CATHETERS COMPARISON:  Chest CTA-08/22/2018 MEDICATIONS: None ANESTHESIA/SEDATION: Moderate (conscious) sedation was employed during this procedure. A total of Versed 1 mg and Fentanyl 50 mcg was administered intravenously. Moderate Sedation Time: 40 minutes. The patient's level of consciousness and vital signs were monitored continuously by radiology nursing throughout the procedure under my direct supervision. CONTRAST:  10 mL OMNIPAQUE IOHEXOL 300 MG/ML  SOLN FLUOROSCOPY TIME:  3 minutes, 36 seconds (24 mGy) COMPLICATIONS: None immediate. TECHNIQUE: Informed written consent was obtained from the patient after a discussion of the risks, benefits and alternatives to treatment. Questions regarding the procedure were encouraged and answered. A timeout was performed prior to the initiation of the procedure. Ultrasound scanning was performed of the right groin and demonstrated wide patency of the right common femoral vein. As such, the right common femoral vein was selected venous access. The right groin was prepped and draped in the usual sterile fashion, and a sterile drape was applied covering the operative field. Maximum barrier sterile technique with sterile gowns and gloves were used for the procedure. A timeout was performed  prior to the initiation of the procedure. Local anesthesia was provided with 1% lidocaine. Under direct ultrasound guidance, the right common femoral vein was accessed with a micro puncture kit ultimately allowing placement of a 6 French, 35 cm vascular sheath. Slightly caudal to this initial access, the right common femoral was again accessed with an additional micropuncture kit ultimately  allowing placement of an additional 6 French, 35 cm vascular sheath. Ultrasound images were saved for procedural documentation purposes. With the use of a glidewire, a vertebral catheter was advanced into the main pulmonary artery and a limited central pulmonary arteriogram was performed. Pressure measurements were attempted to be obtained from the level the main pulmonary artery however this ultimately proved unsuccessful secondary to measurement device malfunction. The vertebral catheter was advanced into the distal branch of the left lower lobe pulmonary artery. Limited contrast injection confirmed appropriate positioning. Over an exchange length Rosen wire, the vertebral catheter was exchanged for a 105/12 cm multi side-hole EKOS ultrasound assisted infusion catheter. With the use of a glidewire, a vertebral catheter was advanced into a distal branch of the right lower lobe pulmonary artery. Limited contrast injection confirmed appropriate positioning. Over an exchange length Rosen wire, the pigtail catheter was exchanged for a 105/18 cm multi side-hole EKOS ultrasound assisted infusion catheter. A postprocedural fluoroscopic image was obtained to document final catheter positioning. The external catheter tubing was secured at the right thigh and the lytic therapy was initiated. The patient tolerated the procedure well without immediate postprocedural complication. FINDINGS: Limited central pulmonary arteriogram was performed to document appropriate catheter positioning. As above, pressure measurements unable to be obtained  secondary to pressure measurement device malfunction. Following the procedure, both ultrasound assisted infusion catheter tips terminate within the distal aspects of the bilateral lower lobe sub segmental pulmonary arteries. IMPRESSION: Successful fluoroscopic guided initiation of bilateral ultrasound assisted catheter directed pulmonary arterial lysis for sub massive pulmonary embolism and right-sided heart strain. PLAN: - Will proceed with 12 hour bilateral pulmonary arterial catheter directed thrombolysis. - Will discussed appropriateness of placement of an IVC filter in the setting of left lower extremity DVT with providing ICU attending prior to removal of infusion catheters. Electronically Signed   By: Sandi Mariscal M.D.   On: 08/23/2018 08:38   Dg Chest Port 1 View  Result Date: 08/23/2018 CLINICAL DATA:  Post initiation of bilateral pulmonary arterial catheter directed thrombolysis. Evaluate pulmonary infusion catheter positioning. EXAM: PORTABLE CHEST 1 VIEW COMPARISON:  Chest radiograph - 08/21/2018; initiation of bilateral pulmonary arterial catheter directed thrombolysis - 08/22/2018; chest CT - 08/22/2018 FINDINGS: Grossly unchanged cardiac silhouette and mediastinal contours. Unchanged positioning of bilateral pulmonary arterial infusion catheters with tips overlying the expected location of the bilateral lower lobe subsegmental pulmonary arteries the right approximately 15 cm from the expected location of the main pulmonary artery and the left approximately 17 cm. Stable positioning of right jugular approach port a catheter with tip projected over the superior cavoatrial junction. No focal airspace opacities. No pleural effusion or pneumothorax no evidence of edema. No acute osseous abnormalities. IMPRESSION: Unchanged positioning of bilateral pulmonary arterial lytic infusion catheters. Electronically Signed   By: Sandi Mariscal M.D.   On: 08/23/2018 09:00   Dg Chest Port 1 View  Result Date:  08/21/2018 CLINICAL DATA:  Head neck cancer. Hyperglycemia. EXAM: PORTABLE CHEST 1 VIEW COMPARISON:  None. FINDINGS: The heart size and mediastinal contours are within normal limits. Both lungs are clear. The visualized skeletal structures are unremarkable. Port-A-Cath tip proximal RIGHT atrium. IMPRESSION: No active disease. Electronically Signed   By: Staci Righter M.D.   On: 08/21/2018 18:42   Vas [place] Lower Extremity Venous (dvt)  Result Date: 08/24/2018  Lower Venous Study Indications: Pulmonary embolism.  Performing Technologist: Oliver Hum RVT  Examination Guidelines: A complete evaluation includes B-mode imaging, spectral Doppler, color Doppler, and power Doppler as  needed of all accessible portions of each vessel. Bilateral testing is considered an integral part of a complete examination. Limited examinations for reoccurring indications may be performed as noted.  Right Venous Findings: +---------+---------------+---------+-----------+----------+-------+            Compressibility Phasicity Spontaneity Properties Summary  +---------+---------------+---------+-----------+----------+-------+  CFV       Full            Yes       Yes                             +---------+---------------+---------+-----------+----------+-------+  SFJ       Full                                                      +---------+---------------+---------+-----------+----------+-------+  FV Prox   Full                                                      +---------+---------------+---------+-----------+----------+-------+  FV Mid    Full                                                      +---------+---------------+---------+-----------+----------+-------+  FV Distal Full                                                      +---------+---------------+---------+-----------+----------+-------+  PFV       Full                                                      +---------+---------------+---------+-----------+----------+-------+   POP       Full            Yes       Yes                             +---------+---------------+---------+-----------+----------+-------+  PTV       Full                                                      +---------+---------------+---------+-----------+----------+-------+  PERO      Full                                                      +---------+---------------+---------+-----------+----------+-------+  Left Venous  Findings: +---------+---------------+---------+-----------+----------+-------+            Compressibility Phasicity Spontaneity Properties Summary  +---------+---------------+---------+-----------+----------+-------+  CFV       Full            No        No                              +---------+---------------+---------+-----------+----------+-------+  SFJ       Full                                                      +---------+---------------+---------+-----------+----------+-------+  FV Prox   Full                                                      +---------+---------------+---------+-----------+----------+-------+  FV Mid    Full                                                      +---------+---------------+---------+-----------+----------+-------+  FV Distal Full                                                      +---------+---------------+---------+-----------+----------+-------+  PFV       Full                                                      +---------+---------------+---------+-----------+----------+-------+  POP       Partial         No        No                     Acute    +---------+---------------+---------+-----------+----------+-------+  PTV       Partial                                          Acute    +---------+---------------+---------+-----------+----------+-------+  PERO      None                                             Acute    +---------+---------------+---------+-----------+----------+-------+  Gastroc   Full                                                       +---------+---------------+---------+-----------+----------+-------+  Summary: Right: There is no evidence of deep vein thrombosis in the lower extremity. Left: Findings consistent with acute deep vein thrombosis involving the left popliteal vein, left posterior tibial vein, and left peroneal vein. No cystic structure found in the popliteal fossa.  *See table(s) above for measurements and observations. Electronically signed by Monica Martinez MD on 08/24/2018 at 9:04:47 AM.    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 Request made for placement of bilateral infusion catheters for the initiation pulmonary arterial thrombolysis EXAM: 1. ULTRASOUND GUIDANCE FOR VENOUS ACCESS X2 2. PULMONARY ARTERIOGRAPHY 3. FLUOROSCOPIC GUIDED PLACEMENT OF BILATERAL PULMONARY ARTERIAL LYTIC INFUSION CATHETERS COMPARISON:  Chest CTA-08/22/2018 MEDICATIONS: None ANESTHESIA/SEDATION: Moderate (conscious) sedation was employed during this procedure. A total of Versed 1 mg and Fentanyl 50 mcg was administered intravenously. Moderate Sedation Time: 40 minutes. The patient's level of consciousness and vital signs were monitored continuously by radiology nursing throughout the procedure under my direct supervision. CONTRAST:  10 mL OMNIPAQUE IOHEXOL 300 MG/ML  SOLN FLUOROSCOPY TIME:  3 minutes, 36 seconds (24 mGy) COMPLICATIONS: None immediate. TECHNIQUE: Informed written consent was obtained from the patient after a discussion of the risks, benefits and alternatives to treatment. Questions regarding the procedure were encouraged and answered. A timeout was performed prior to the initiation of the procedure. Ultrasound scanning was performed of the right groin and demonstrated wide patency of the right common femoral vein. As such, the right common femoral vein was selected venous access. The right groin was  prepped and draped in the usual sterile fashion, and a sterile drape was applied covering the operative field. Maximum barrier sterile technique with sterile gowns and gloves were used for the procedure. A timeout was performed prior to the initiation of the procedure. Local anesthesia was provided with 1% lidocaine. Under direct ultrasound guidance, the right common femoral vein was accessed with a micro puncture kit ultimately allowing placement of a 6 French, 35 cm vascular sheath. Slightly caudal to this initial access, the right common femoral was again accessed with an additional micropuncture kit ultimately allowing placement of an additional 6 French, 35 cm vascular sheath. Ultrasound images were saved for procedural documentation purposes. With the use of a glidewire, a vertebral catheter was advanced into the main pulmonary artery and a limited central pulmonary arteriogram was performed. Pressure measurements were attempted to be obtained from the level the main pulmonary artery however this ultimately proved unsuccessful secondary to measurement device malfunction. The vertebral catheter was advanced into the distal branch of the left lower lobe pulmonary artery. Limited contrast injection confirmed appropriate positioning. Over an exchange length Rosen wire, the vertebral catheter was exchanged for a 105/12 cm multi side-hole EKOS ultrasound assisted infusion catheter. With the use of a glidewire, a vertebral catheter was advanced into a distal branch of the right lower lobe pulmonary artery. Limited contrast injection confirmed appropriate positioning. Over an exchange length Rosen wire, the pigtail catheter was exchanged for a 105/18 cm multi side-hole EKOS ultrasound assisted infusion catheter. A postprocedural fluoroscopic image was obtained to document final catheter positioning. The external catheter tubing was secured at the right thigh and the lytic therapy was initiated. The patient tolerated  the procedure well without immediate postprocedural complication. FINDINGS: Limited central pulmonary arteriogram was performed to document appropriate catheter positioning. As above, pressure measurements unable to be obtained secondary to pressure measurement device malfunction. Following the  procedure, both ultrasound assisted infusion catheter tips terminate within the distal aspects of the bilateral lower lobe sub segmental pulmonary arteries. IMPRESSION: Successful fluoroscopic guided initiation of bilateral ultrasound assisted catheter directed pulmonary arterial lysis for sub massive pulmonary embolism and right-sided heart strain. PLAN: - Will proceed with 12 hour bilateral pulmonary arterial catheter directed thrombolysis. - Will discussed appropriateness of placement of an IVC filter in the setting of left lower extremity DVT with providing ICU attending prior to removal of infusion catheters. Electronically Signed   By: Sandi Mariscal M.D.   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 Request made for placement of bilateral infusion catheters for the initiation pulmonary arterial thrombolysis EXAM: 1. ULTRASOUND GUIDANCE FOR VENOUS ACCESS X2 2. PULMONARY ARTERIOGRAPHY 3. FLUOROSCOPIC GUIDED PLACEMENT OF BILATERAL PULMONARY ARTERIAL LYTIC INFUSION CATHETERS COMPARISON:  Chest CTA-08/22/2018 MEDICATIONS: None ANESTHESIA/SEDATION: Moderate (conscious) sedation was employed during this procedure. A total of Versed 1 mg and Fentanyl 50 mcg was administered intravenously. Moderate Sedation Time: 40 minutes. The patient's level of consciousness and vital signs were monitored continuously by radiology nursing throughout the procedure under my direct supervision. CONTRAST:  10 mL OMNIPAQUE IOHEXOL 300 MG/ML  SOLN FLUOROSCOPY TIME:  3 minutes, 36 seconds (24 mGy) COMPLICATIONS:  None immediate. TECHNIQUE: Informed written consent was obtained from the patient after a discussion of the risks, benefits and alternatives to treatment. Questions regarding the procedure were encouraged and answered. A timeout was performed prior to the initiation of the procedure. Ultrasound scanning was performed of the right groin and demonstrated wide patency of the right common femoral vein. As such, the right common femoral vein was selected venous access. The right groin was prepped and draped in the usual sterile fashion, and a sterile drape was applied covering the operative field. Maximum barrier sterile technique with sterile gowns and gloves were used for the procedure. A timeout was performed prior to the initiation of the procedure. Local anesthesia was provided with 1% lidocaine. Under direct ultrasound guidance, the right common femoral vein was accessed with a micro puncture kit ultimately allowing placement of a 6 French, 35 cm vascular sheath. Slightly caudal to this initial access, the right common femoral was again accessed with an additional micropuncture kit ultimately allowing placement of an additional 6 French, 35 cm vascular sheath. Ultrasound images were saved for procedural documentation purposes. With the use of a glidewire, a vertebral catheter was advanced into the main pulmonary artery and a limited central pulmonary arteriogram was performed. Pressure measurements were attempted to be obtained from the level the main pulmonary artery however this ultimately proved unsuccessful secondary to measurement device malfunction. The vertebral catheter was advanced into the distal branch of the left lower lobe pulmonary artery. Limited contrast injection confirmed appropriate positioning. Over an exchange length Rosen wire, the vertebral catheter was exchanged for a 105/12 cm multi side-hole EKOS ultrasound assisted infusion catheter. With the use of a glidewire, a vertebral catheter was  advanced into a distal branch of the right lower lobe pulmonary artery. Limited contrast injection confirmed appropriate positioning. Over an exchange length Rosen wire, the pigtail catheter was exchanged for a 105/18 cm multi side-hole EKOS ultrasound assisted infusion catheter. A postprocedural fluoroscopic image was obtained to document final catheter positioning. The external catheter tubing was secured at the right thigh and the lytic therapy was initiated. The patient tolerated the procedure well  without immediate postprocedural complication. FINDINGS: Limited central pulmonary arteriogram was performed to document appropriate catheter positioning. As above, pressure measurements unable to be obtained secondary to pressure measurement device malfunction. Following the procedure, both ultrasound assisted infusion catheter tips terminate within the distal aspects of the bilateral lower lobe sub segmental pulmonary arteries. IMPRESSION: Successful fluoroscopic guided initiation of bilateral ultrasound assisted catheter directed pulmonary arterial lysis for sub massive pulmonary embolism and right-sided heart strain. PLAN: - Will proceed with 12 hour bilateral pulmonary arterial catheter directed thrombolysis. - Will discussed appropriateness of placement of an IVC filter in the setting of left lower extremity DVT with providing ICU attending prior to removal of infusion catheters. Electronically Signed   By: Sandi Mariscal M.D.   On: 08/23/2018 08:38   Ir Jacolyn Reedy F/u Eval Art/ven Final Day (ms)  Result Date: 08/23/2018 INDICATION: History of sub massive pulmonary embolism, post initiation of bilateral catheter directed pulmonary arterial thrombolysis. Request made for pulmonary arterial pressure measurement prior to removal of infusion catheters. Discussion of appropriateness of proceeding with IVC filter placement for temporary caval interruption purposes was discussed with ICU attending, Dr. Chase Caller, however  the decision was made not to proceed with filter placement this time as the patient appears to be tolerating initiated anticoagulation without incident. EXAM: BEDSIDE PULMONARY ARTERIAL PRESSURE MEASUREMENT AND PULMONARY INFUSION CATHETER REMOVAL COMPARISON:  Chest radiograph - earlier same day; Initiation of bilateral pulmonary arterial catheter directed thrombolysis - 08/22/2018; chest CT-08/22/2018 MEDICATIONS: None CONTRAST:  None FLUOROSCOPY TIME:  None COMPLICATIONS: None immediate. TECHNIQUE: Review of chest radiograph performed earlier this morning demonstrates unchanged positioning of bilateral pulmonary arterial infusion catheters. The patient was positioned supine on his hospital bed. The right pulmonary arterial catheter infusion wire was removed and the multi side-hole infusion catheter was retracted approximately 15 cm to the level of the main pulmonary artery. Pressure measurements were acquired from this location. At this point, the procedure was terminated. All wires, catheters and sheaths were removed from the patient. Hemostasis was achieved at the right groin access site with manual compression. A dressing was placed. The patient tolerated the procedure well without immediate postprocedural complication. FINDINGS: Preprocedural spot chest radiograph demonstrates unchanged positioning of bilateral pulmonary arterial infusion catheters. The right pulmonary arterial infusion catheter was retracted approximately 15 cm to the level of the main pulmonary artery. Acquired pressure measurements as follows: Preprocedural main pulmonary artery - unable to be obtained secondary to device malfunction. Postprocedural main pulmonary artery - 44/26; mean - 33 IMPRESSION: Technically successful completion of bilateral catheter directed pulmonary arterial thrombolysis. Electronically Signed   By: Sandi Mariscal M.D.   On: 08/23/2018 10:14    Micro Results    Recent Results (from the past 240 hour(s))    Culture, blood (routine x 2)     Status: None (Preliminary result)   Collection Time: 08/25/18  9:32 AM  Result Value Ref Range Status   Specimen Description BLOOD RIGHT ANTECUBITAL  Final   Special Requests   Final    BOTTLES DRAWN AEROBIC ONLY Blood Culture adequate volume   Culture   Final    NO GROWTH 2 DAYS Performed at [facility], [address] [place], [place] [address]    Report Status PENDING  Incomplete  Culture, blood (routine x 2)     Status: None (Preliminary result)   Collection Time: 08/25/18  9:37 AM  Result Value Ref Range Status   Specimen Description BLOOD RIGHT HAND  Final   Special Requests  Final    BOTTLES DRAWN AEROBIC ONLY Blood Culture adequate volume   Culture   Final    NO GROWTH 2 DAYS Performed at [facility], [place] [address], [place], [place] [address]    Report Status PENDING  Incomplete       Today   Subjective    Leonard Feigel today states that he has had no bowel movement in the last few days, but still able to pass gas.  Reports that he is ready to go home and feels comfortable with recommendation for treatment of diabetes.   Objective   Blood pressure 109/77, pulse 97, temperature 97.7 F (36.5 C), temperature source Oral, resp. rate 16, height 5' 8"  (1.727 m), weight 76.6 kg, SpO2 95 %.   Intake/Output Summary (Last 24 hours) at 08/27/2018 1203 Last data filed at 08/26/2018 1843 Gross per 24 hour  Intake 2240 ml  Output 325 ml  Net 1915 ml    Exam  Constitutional: NAD, calm, comfortable administering tube feeds through PEG. Eyes: PERRL, lids and conjunctivae normal ENMT: Mucous membranes are dry.  Mucositis present at the mucous membranes. Neck: Postradiation changes to the skin present with dressing on left side of neck. Respiratory: clear to auscultation bilaterally, no wheezing, no crackles. Normal respiratory effort. No accessory muscle use.  Cardiovascular: Regular rate and rhythm, no murmurs / rubs /  gallops. No extremity edema. 2+ pedal pulses. No carotid bruits.  Abdomen: no tenderness, no masses palpated. No hepatosplenomegaly. Bowel sounds positive.  PEG tube in place. Musculoskeletal: no clubbing / cyanosis. No joint deformity upper and lower extremities. Good ROM, no contractures. Normal muscle tone.  Skin: Postradiation changes as noted above neck and  Neurologic: CN 2-12 grossly intact. Sensation intact, DTR normal. Strength 5/5 in all 4.  Psychiatric: Normal judgment and insight. Alert and oriented x 3. Normal mood.    Data Review   CBC w Diff:  Lab Results  Component Value Date   WBC 1.7 (L) 08/27/2018   HGB 10.7 (L) 08/27/2018   HGB 12.8 (L) 08/20/2018   HCT 32.9 (L) 08/27/2018   PLT 152 08/27/2018   PLT 129 (L) 08/20/2018   LYMPHOPCT 14 08/27/2018   BANDSPCT 7 08/21/2018   MONOPCT 13 08/27/2018   EOSPCT 1 08/27/2018   BASOPCT 0 08/27/2018    CMP:  Lab Results  Component Value Date   NA 133 (L) 08/27/2018   K 4.2 08/27/2018   CL 97 (L) 08/27/2018   CO2 29 08/27/2018   BUN 20 08/27/2018   CREATININE 0.75 08/27/2018   CREATININE 1.60 (H) 08/20/2018   PROT 5.7 (L) 08/27/2018   ALBUMIN 3.0 (L) 08/27/2018   BILITOT 0.6 08/27/2018   BILITOT 0.4 06/18/2018   ALKPHOS 90 08/27/2018   AST 16 08/27/2018   AST 17 06/18/2018   ALT 55 (H) 08/27/2018   ALT 38 06/18/2018  .   Total Time in preparing paper work, data evaluation and todays exam - 35 minutes  Norval Morton M.D on 08/27/2018 at 12:03 PM  [facility]   Office  [phone]

## 2018-08-27 NOTE — Telephone Encounter (Signed)
Wife Nevin Bloodgood called stating that pt will probably be discharged today from the hospital.  Nevin Bloodgood wanted to know if Dr. Maylon Peppers will order Insulin and glucometer supplies for pt's home use.  Nevin Bloodgood also wanted to know if pt should continue with Metformin. Nevin Bloodgood' s  Phone   [phone]

## 2018-08-27 NOTE — Discharge Instructions (Signed)
Insulin Injection Instructions, Using Insulin Pens, Adult A subcutaneous injection is a shot of medicine that is injected into the layer of fat and tissue between skin and muscle. People with type 1 diabetes must take insulin because their bodies do not make it. People with type 2 diabetes may need to take insulin. There are many different types of insulin. The type of insulin that you take may determine how many injections you give yourself and when you need to give the injections. Supplies needed:  Soap and water to wash hands.  Your insulin pen.  A new, unused needle.  Alcohol wipes.  A disposal container that is meant for sharp items (sharps container), such as an empty plastic bottle with a cover. How to choose a site for injection The body absorbs insulin differently, depending on where the insulin is injected (injection site). It is best to inject insulin into the same body area each time (for example, always in the abdomen), but you should use a different spot in that area for each injection. Do not inject the insulin in the same spot each time. There are five main areas that can be used for injecting. These areas include:  Abdomen. This is the preferred area.  Front of thigh.  Upper, outer side of thigh.  Upper, outer side of arm.  Upper, outer part of buttock. How to use an insulin pen  First, follow the steps for Get ready, then continue with the steps for Inject the insulin. Get ready 1. Wash your hands with soap and water. If soap and water are not available, use hand sanitizer. 2. Before you give yourself an insulin injection, be sure to test your blood sugar level (blood glucose level) and write down that number. Follow any instructions from your health care provider about what to do if your blood glucose level is higher or lower than your normal range. 3. Check the expiration date and the type of insulin that is in the pen. 4. If you are using CLEAR insulin, check to  see that it is clear and free of clumps. 5. If you are using CLOUDY insulin, do not shake the pen to get the injection ready. Instead, get it ready in one of these ways: ? Gently roll the pen between your palms several times. ? Tip the pen up and down several times. 6. Remove the cap from the insulin pen. 7. Use an alcohol wipe to clean the rubber tip of the pen. 8. Remove the protective paper tab from the disposable needle. Do not let the needle touch anything. 9. Screw a new, unused needle onto the pen. 10. Remove the outer plastic needle cover. Do not throw away the outer plastic cover yet. ? If the pen uses a special safety needle, leave the inner needle shield in place. ? If the pen does not use a special safety needle, remove the inner plastic cover from the needle. 11. Follow the manufacturer's instructions to prime the insulin pen with the volume of insulin needed. Hold the pen with the needle pointing up, and push the button on the opposite end of the pen until a drop of insulin appears at the needle tip. If no insulin appears, repeat this step. 12. Turn the button (dial) to the number of units of insulin that you will be injecting. Inject the insulin 1. Use an alcohol wipe to clean the site where you will be injecting the needle. Let the site air-dry. 2. Hold the pen in the  palm of your writing hand like a pencil. 3. If directed by your health care provider, use your other hand to pinch and hold about an inch (2.5 cm) of skin at the injection site. Do not directly touch the cleaned part of the skin. 4. Gently but quickly, use your writing hand to put the needle straight into the skin. The needle should be at a 90-degree angle (perpendicular) to the skin. 5. When the needle is completely inserted into the skin, use your thumb or index finger of your writing hand to push the top button of the pen down all the way to inject the insulin. 6. Let go of the skin that you are pinching. Continue  to hold the pen in place with your writing hand. 7. Wait 10 seconds, then pull the needle straight out of the skin. This will allow all of the insulin to go from the pen and needle into your body. 8. Carefully put the larger (outer) plastic cover of the needle back over the needle, then unscrew the capped needle and discard it in a sharps container, such as an empty plastic bottle with a cover. 9. Put the plastic cap back on the insulin pen. How to throw away supplies  Discard all used needles in a puncture-proof sharps disposal container. You can ask your local pharmacy about where you can get this kind of disposal container, or you can use an empty plastic liquid laundry detergent bottle that has a cover.  Follow the disposal regulations for the area where you live. Do not use any needle more than one time.  Throw away empty disposable pens in the regular trash. Questions to ask your health care provider  How often should I be taking insulin?  How often should I check my blood glucose?  What amount of insulin should I be taking at each time?  What are the side effects?  What should I do if my blood glucose is too high?  What should I do if my blood glucose is too low?  What should I do if I forget to take my insulin?  What number should I call if I have questions? Where to find more information  American Diabetes Association (ADA): www.diabetes.org  American Association of Diabetes Educators (AADE) Patient Resources: https://www.diabeteseducator.org Summary  A subcutaneous injection is a shot of medicine that is injected into the layer of fat and tissue between skin and muscle.  Before you give yourself an insulin injection, be sure to test your blood sugar level (blood glucose level) and write down that number.  Check the expiration date and the type of insulin that is in the pen. The type of insulin that you take may determine how many injections you give yourself and when  you need to give the injections.  It is best to inject insulin into the same body area each time (for example, always in the abdomen), but you should use a different spot in that area for each injection. This information is not intended to replace advice given to you by your health care provider. Make sure you discuss any questions you have with your health care provider. Document Released: 06/10/2015 Document Revised: 05/27/2017 Document Reviewed: 06/10/2015 Elsevier Interactive Patient Education  2019 [place]. Hemoglobin A1c Test Why am I having this test? You may have the hemoglobin A1c test (HbA1c test) done to:  Evaluate your risk for developing diabetes (diabetes mellitus).  Diagnose diabetes.  Monitor long-term control of blood sugar (glucose) in  people who have diabetes and help make treatment decisions. This test may be done with other blood glucose tests, such as fasting blood glucose and oral glucose tolerance tests. What is being tested? Hemoglobin is a type of protein in the blood that carries oxygen. Glucose attaches to hemoglobin to form glycated hemoglobin. This test checks the amount of glycated hemoglobin in your blood, which is a good indicator of the average amount of glucose in your blood during the past 2-3 months. What kind of sample is taken?  A blood sample is required for this test. It is usually collected by inserting a needle into a blood vessel. Tell a health care provider about:  All medicines you are taking, including vitamins, herbs, eye drops, creams, and over-the-counter medicines.  Any blood disorders you have.  Any surgeries you have had.  Any medical conditions you have.  Whether you are pregnant or may be pregnant. How are the results reported? Your results will be reported as a percentage that indicates how much of your hemoglobin has glucose attached to it (is glycated). Your health care provider will compare your results to normal ranges  that were established after testing a large group of people (reference ranges). Reference ranges may vary among labs and hospitals. For this test, common reference ranges are:  Adult or child without diabetes: 4-5.6%.  Adult or child with diabetes and good blood glucose control: less than 7%. What do the results mean? If you have diabetes:  A result of less than 7% is considered normal, meaning that your blood glucose is well controlled.  A result higher than 7% means that your blood glucose is not well controlled, and your treatment plan may need to be adjusted. If you do not have diabetes:  A result within the reference range is considered normal, meaning that you are not at high risk for diabetes.  A result of 5.7-6.4% means that you have a high risk of developing diabetes, and you may have prediabetes. Prediabetes is the condition of having a blood glucose level that is higher than it should be, but not high enough for you to be diagnosed with diabetes. Having prediabetes puts you at risk for developing type 2 diabetes (type 2 diabetes mellitus). You may have more tests, including a repeat HbA1c test.  Results of 6.5% or higher on two separate HbA1c tests mean that you have diabetes. You may have more tests to confirm the diagnosis. Abnormally low HbA1c values may be caused by:  Pregnancy.  Severe blood loss.  Receiving donated blood (transfusions).  Low red blood cell count (anemia).  Long-term kidney failure.  Some unusual forms (variants) of hemoglobin. Talk with your health care provider about what your results mean. Questions to ask your health care provider Ask your health care provider, or the department that is doing the test:  When will my results be ready?  How will I get my results?  What are my treatment options?  What other tests do I need?  What are my next steps? Summary  The hemoglobin A1c test (HbA1c test) may be done to evaluate your risk for  developing diabetes, to diagnose diabetes, and to monitor long-term control of blood sugar (glucose) in people who have diabetes and help make treatment decisions.  Hemoglobin is a type of protein in the blood that carries oxygen. Glucose attaches to hemoglobin to form glycated hemoglobin. This test checks the amount of glycated hemoglobin in your blood, which is a good indicator of  the average amount of glucose in your blood during the past 2-3 months.  Talk with your health care provider about what your results mean. This information is not intended to replace advice given to you by your health care provider. Make sure you discuss any questions you have with your health care provider. Document Released: 05/29/2004 Document Revised: 12/18/2016 Document Reviewed: 12/18/2016 Elsevier Interactive Patient Education  2019 [place]. Blood Glucose Monitoring, Adult Monitoring your blood sugar (glucose) is an important part of managing your diabetes (diabetes mellitus). Blood glucose monitoring involves checking your blood glucose as often as directed and keeping a record (log) of your results over time. Checking your blood glucose regularly and keeping a blood glucose log can:  Help you and your health care provider adjust your diabetes management plan as needed, including your medicines or insulin.  Help you understand how food, exercise, illnesses, and medicines affect your blood glucose.  Let you know what your blood glucose is at any time. You can quickly find out if you have low blood glucose (hypoglycemia) or high blood glucose (hyperglycemia). Your health care provider will set individualized treatment goals for you. Your goals will be based on your age, other medical conditions you have, and how you respond to diabetes treatment. Generally, the goal of treatment is to maintain the following blood glucose levels:  Before meals (preprandial): 80-130 mg/dL (4.4-7.2 mmol/L).  After meals  (postprandial): below 180 mg/dL (10 mmol/L).  A1c level: less than 7%. Supplies needed:  Blood glucose meter.  Test strips for your meter. Each meter has its own strips. You must use the strips that came with your meter.  A needle to prick your finger (lancet). Do not use a lancet more than one time.  A device that holds the lancet (lancing device).  A journal or log book to write down your results. How to check your blood glucose  1. Wash your hands with soap and water. 2. Prick the side of your finger (not the tip) with the lancet. Use a different finger each time. 3. Gently rub the finger until a small drop of blood appears. 4. Follow instructions that come with your meter for inserting the test strip, applying blood to the strip, and using your blood glucose meter. 5. Write down your result and any notes. Some meters allow you to use areas of your body other than your finger (alternative sites) to test your blood. The most common alternative sites are:  Forearm.  Thigh.  Palm of the hand. If you think you may have hypoglycemia, or if you have a history of not knowing when your blood glucose is getting low (hypoglycemia unawareness), do not use alternative sites. Use your finger instead. Alternative sites may not be as accurate as the fingers, because blood flow is slower in these areas. This means that the result you get may be delayed, and it may be different from the result that you would get from your finger. Follow these instructions at home: Blood glucose log   Every time you check your blood glucose, write down your result. Also write down any notes about things that may be affecting your blood glucose, such as your diet and exercise for the day. This information can help you and your health care provider: ? Look for patterns in your blood glucose over time. ? Adjust your diabetes management plan as needed.  Check if your meter allows you to download your records to a  computer. Most glucose meters  store a record of glucose readings in the meter. If you have type 1 diabetes:  Check your blood glucose 2 or more times a day.  Also check your blood glucose: ? Before every insulin injection. ? Before and after exercise. ? Before meals. ? 2 hours after a meal. ? Occasionally between 2:00 a.m. and 3:00 a.m., as directed. ? Before potentially dangerous tasks, like driving or using heavy machinery. ? At bedtime.  You may need to check your blood glucose more often, up to 6-10 times a day, if you: ? Use an insulin pump. ? Need multiple daily injections (MDI). ? Have diabetes that is not well-controlled. ? Are ill. ? Have a history of severe hypoglycemia. ? Have hypoglycemia unawareness. If you have type 2 diabetes:  If you take insulin or other diabetes medicines, check your blood glucose 2 or more times a day.  If you are on intensive insulin therapy, check your blood glucose 4 or more times a day. Occasionally, you may also need to check between 2:00 a.m. and 3:00 a.m., as directed.  Also check your blood glucose: ? Before and after exercise. ? Before potentially dangerous tasks, like driving or using heavy machinery.  You may need to check your blood glucose more often if: ? Your medicine is being adjusted. ? Your diabetes is not well-controlled. ? You are ill. General tips  Always keep your supplies with you.  If you have questions or need help, all blood glucose meters have a 24-hour "hotline" phone number that you can call. You may also contact your health care provider.  After you use a few boxes of test strips, adjust (calibrate) your blood glucose meter by following instructions that came with your meter. Contact a health care provider if:  Your blood glucose is at or above 240 mg/dL (13.3 mmol/L) for 2 days in a row.  You have been sick or have had a fever for 2 days or longer, and you are not getting better.  You have any of the  following problems for more than 6 hours: ? You cannot eat or drink. ? You have nausea or vomiting. ? You have diarrhea. Get help right away if:  Your blood glucose is lower than 54 mg/dL (3 mmol/L).  You become confused or you have trouble thinking clearly.  You have difficulty breathing.  You have moderate or large ketone levels in your urine. Summary  Monitoring your blood sugar (glucose) is an important part of managing your diabetes (diabetes mellitus).  Blood glucose monitoring involves checking your blood glucose as often as directed and keeping a record (log) of your results over time.  Your health care provider will set individualized treatment goals for you. Your goals will be based on your age, other medical conditions you have, and how you respond to diabetes treatment.  Every time you check your blood glucose, write down your result. Also write down any notes about things that may be affecting your blood glucose, such as your diet and exercise for the day. This information is not intended to replace advice given to you by your health care provider. Make sure you discuss any questions you have with your health care provider. Document Released: 05/10/2003 Document Revised: 03/18/2017 Document Reviewed: 10/17/2015 Elsevier Interactive Patient Education  2019 [place]. Diabetes Mellitus and Sick Day Management Blood sugar (glucose) can be difficult to control when you are sick. Common illnesses that can cause problems for people with diabetes (diabetes mellitus) include colds, fever,  flu (influenza), nausea, vomiting, and diarrhea. These illnesses can cause stress and loss of body fluids (dehydration), and those issues can cause blood glucose levels to increase. Because of this, it is very important to take your insulin and diabetes medicines and eat some form of carbohydrate when you are sick. You should make a plan for days when you are sick (sick day plan) as part of your  diabetes management plan. You and your health care provider should make this plan in advance. The following guidelines are intended to help you manage an illness that lasts for about 24 hours or less. Your health care provider may also give you more specific instructions. What do I need to do to manage my blood glucose?   Check your blood glucose every 2-4 hours, or as often as told by your health care provider.  Know your sick day treatment goals. Your target blood glucose levels may be different when you are sick.  If you use insulin, take your usual dose. ? If your blood glucose continues to be too high, you may need to take an additional insulin dose as told by your health care provider.  If you use oral diabetes medicine, you may need to stop taking it if you are not able to eat or drink normally. Ask your health care provider about whether you need to stop taking these medicines while you are sick.  If you use injectable hormone medicines other than insulin to control your diabetes, ask your health care provider about whether you need to stop taking these medicines while you are sick. What else can I do to manage my diabetes when I am sick? Check your ketones  If you have type 1 diabetes, check your urine ketones every 4 hours.  If you have type 2 diabetes, check your urine ketones as often as told by your health care provider. Drink fluids  Drink enough fluid to keep your urine clear or pale yellow. This is especially important if you have a fever, vomiting, or diarrhea. Those symptoms can lead to dehydration.  Follow any instructions from your health care provider about beverages to avoid. ? Do not drink alcohol, caffeine, or drinks that contain a lot of sugar. Take medicines as directed  Take-over-the-counter and prescription medicines only as told by your health care provider.  Check medicine labels for added sugars. Some medicines may contain sugar or types of sugars that can  raise your blood glucose level. What foods can I eat when I am sick?  You need to eat some form of carbohydrates when you are sick. You should eat 45-50 grams (45-50 g) of carbohydrates every 3-4 hours until you feel better. All of the food choices below contain about 15 g of carbohydrates. Plan ahead and keep some of these foods around so you have them if you get sick.  4-6 oz (120-177 mL) carbonated beverage that contains sugar, such as regular (not diet) soda. You may be able to drink carbonated beverages more easily if you open the beverage and let it sit at room temperature for a few minutes before drinking.   of a twin frozen ice pop.  4 oz (120 g) regular gelatin.  4 oz (120 mL) fruit juice.  4 oz (120 g) ice cream or frozen yogurt.  2 oz (60 g) sherbet.  8 oz (240 mL) clear broth or soup.  4 oz (120 g) regular custard.  4 oz (120 g) regular pudding.  8 oz (240 g)  plain yogurt.  1 slice bread or toast.  6 saltine crackers.  5 vanilla wafers. Questions to ask your health care provider Consider asking the following questions so you know what to do on days when you are sick:  Should I adjust my diabetes medicines?  How often do I need to check my blood glucose?  What supplies do I need to manage my diabetes at home when I am sick?  What number can I call if I have questions?  What foods and drinks should I avoid? Contact a health care provider if:  You develop symptoms of diabetic ketoacidosis, such as: ? Fatigue. ? Weight loss. ? Excessive thirst. ? Light-headedness. ? Fruity or sweet-smelling breath. ? Excessive urination. ? Vision changes. ? Confusion or irritability. ? Nausea. ? Vomiting. ? Rapid breathing. ? Pain in the abdomen. ? Feeling flushed.  You are unable to drink fluids without vomiting.  You have any of the following for more than 6 hours: ? Nausea. ? Vomiting. ? Diarrhea.  Your blood glucose is at or above 240 mg/dL (13.3  mmol/L), even after you take an additional insulin dose.  You have a change in how you think, feel, or act (mental status).  You develop another serious illness.  You have been sick or have had a fever for 2 days or longer and you are not getting better. Get help right away if:  Your blood glucose is lower than 54 mg/dL (3.0 mmol/L).  You have difficulty breathing.  You have moderate or high ketone levels in your urine.  You used emergency glucagon to treat low blood glucose. Summary  Blood sugar (glucose) can be difficult to control when you are sick. Common illnesses that can cause problems for people with diabetes (diabetes mellitus) include colds, fever, flu (influenza), nausea, vomiting, and diarrhea.  Illnesses can cause stress and loss of body fluids (dehydration), and those issues can cause blood glucose levels to increase.  Make a plan for days when you are sick (sick day plan) as part of your diabetes management plan. You and your health care provider should make this plan in advance.  It is very important to take your insulin and diabetes medicines and to eat some form of carbohydrate when you are sick.  Contact your health care provider if have problems managing your blood glucose levels when you are sick, or if you have been sick or had a fever for 2 days or longer and are not getting better. This information is not intended to replace advice given to you by your health care provider. Make sure you discuss any questions you have with your health care provider. Document Released: 05/10/2003 Document Revised: 02/03/2016 Document Reviewed: 02/03/2016 Elsevier Interactive Patient Education  2019 [place]. Hypoglycemia Hypoglycemia occurs when the level of sugar (glucose) in the blood is too low. Hypoglycemia can happen in people who do or do not have diabetes. It can develop quickly, and it can be a medical emergency. For most people with diabetes, a blood glucose level  below 70 mg/dL (3.9 mmol/L) is considered hypoglycemia. Glucose is a type of sugar that provides the body's main source of energy. Certain hormones (insulin and glucagon) control the level of glucose in the blood. Insulin lowers blood glucose, and glucagon raises blood glucose. Hypoglycemia can result from having too much insulin in the bloodstream, or from not eating enough food that contains glucose. You may also have reactive hypoglycemia, which happens within 4 hours after eating a meal.  What are the causes? Hypoglycemia occurs most often in people who have diabetes and may be caused by:  Diabetes medicine.  Not eating enough, or not eating often enough.  Increased physical activity.  Drinking alcohol on an empty stomach. If you do not have diabetes, hypoglycemia may be caused by:  A tumor in the pancreas.  Not eating enough, or not eating for long periods at a time (fasting).  A severe infection or illness.  Certain medicines. What increases the risk? Hypoglycemia is more likely to develop in:  People who have diabetes and take medicines to lower blood glucose.  People who abuse alcohol.  People who have a severe illness. What are the signs or symptoms? Mild symptoms Mild hypoglycemia may not cause any symptoms. If you do have symptoms, they may include:  Hunger.  Anxiety.  Sweating and feeling clammy.  Dizziness or feeling light-headed.  Sleepiness.  Nausea.  Increased heart rate.  Headache.  Blurry vision.  Irritability.  Tingling or numbness around the mouth, lips, or tongue.  A change in coordination.  Restless sleep. Moderate symptoms Moderate hypoglycemia can cause:  Mental confusion and poor judgment.  Behavior changes.  Weakness.  Irregular heartbeat. Severe symptoms Severe hypoglycemia is a medical emergency. It can cause:  Fainting.  Seizures.  Loss of consciousness (coma).  Death. How is this diagnosed? Hypoglycemia is  diagnosed with a blood test to measure your blood glucose level. This blood test is done while you are having symptoms. Your health care provider may also do a physical exam and review your medical history. How is this treated? This condition can often be treated by immediately eating or drinking something that contains sugar, such as:  Fruit juice, 4-6 oz (120-150 mL).  Regular soda (not diet soda), 4-6 oz (120-150 mL).  Low-fat milk, 4 oz (120 mL).  Several pieces of hard candy.  Sugar or honey, 1 Tbsp (15 mL). Treating hypoglycemia if you have diabetes If you are alert and able to swallow safely, follow the 15:15 rule:  Take 15 grams of a rapid-acting carbohydrate. Talk with your health care provider about how much you should take.  Rapid-acting options include: ? Glucose pills (take 15 grams). ? 6-8 pieces of hard candy. ? 4-6 oz (120-150 mL) of fruit juice. ? 4-6 oz (120-150 mL) of regular (not diet) soda. ? 1 Tbsp (15 mL) honey or sugar.  Check your blood glucose 15 minutes after you take the carbohydrate.  If the repeat blood glucose level is still at or below 70 mg/dL (3.9 mmol/L), take 15 grams of a carbohydrate again.  If your blood glucose level does not increase above 70 mg/dL (3.9 mmol/L) after 3 tries, seek emergency medical care.  After your blood glucose level returns to normal, eat a meal or a snack within 1 hour.  Treating severe hypoglycemia Severe hypoglycemia is when your blood glucose level is at or below 54 mg/dL (3 mmol/L). Severe hypoglycemia is a medical emergency. Get medical help right away. If you have severe hypoglycemia and you cannot eat or drink, you may need an injection of glucagon. A family member or close friend should learn how to check your blood glucose and how to give you a glucagon injection. Ask your health care provider if you need to have an emergency glucagon injection kit available. Severe hypoglycemia may need to be treated in a  hospital. The treatment may include getting glucose through an IV. You may also need treatment for the cause  of your hypoglycemia. Follow these instructions at home:  General instructions  Take over-the-counter and prescription medicines only as told by your health care provider.  Monitor your blood glucose as told by your health care provider.  Limit alcohol intake to no more than 1 drink a day for nonpregnant women and 2 drinks a day for men. One drink equals 12 oz of beer (355 mL), 5 oz of wine (148 mL), or 1 oz of hard liquor (44 mL).  Keep all follow-up visits as told by your health care provider. This is important. If you have diabetes:  Always have a rapid-acting carbohydrate snack with you to treat low blood glucose.  Follow your diabetes management plan as directed. Make sure you: ? Know the symptoms of hypoglycemia. It is important to treat it right away to prevent it from becoming severe. ? Take your medicines as directed. ? Follow your exercise plan. ? Follow your meal plan. Eat on time, and do not skip meals. ? Check your blood glucose as often as directed. Always check before and after exercise. ? Follow your sick day plan whenever you cannot eat or drink normally. Make this plan in advance with your health care provider.  Share your diabetes management plan with people in your workplace, school, and household.  Check your urine for ketones when you are ill and as told by your health care provider.  Carry a medical alert card or wear medical alert jewelry. Contact a health care provider if:  You have problems keeping your blood glucose in your target range.  You have frequent episodes of hypoglycemia. Get help right away if:  You continue to have hypoglycemia symptoms after eating or drinking something containing glucose.  Your blood glucose is at or below 54 mg/dL (3 mmol/L).  You have a seizure.  You faint. These symptoms may represent a serious problem  that is an emergency. Do not wait to see if the symptoms will go away. Get medical help right away. Call your local emergency services (911 in the [place] Summary  Hypoglycemia occurs when the level of sugar (glucose) in the blood is too low.  Hypoglycemia can happen in people who do or do not have diabetes. It can develop quickly, and it can be a medical emergency.  Make sure you know the symptoms of hypoglycemia and how to treat it.  Always have a rapid-acting carbohydrate snack with you to treat low blood sugar. This information is not intended to replace advice given to you by your health care provider. Make sure you discuss any questions you have with your health care provider. Document Released: 05/07/2005 Document Revised: 10/29/2017 Document Reviewed: 06/10/2015 Elsevier Interactive Patient Education  2019 [place]. Hyperglycemia Hyperglycemia occurs when the level of sugar (glucose) in the blood is too high. Glucose is a type of sugar that provides the body's main source of energy. Certain hormones (insulin and glucagon) control the level of glucose in the blood. Insulin lowers blood glucose, and glucagon increases blood glucose. Hyperglycemia can result from having too little insulin in the bloodstream, or from the body not responding normally to insulin. Hyperglycemia occurs most often in people who have diabetes (diabetes mellitus), but it can happen in people who do not have diabetes. It can develop quickly, and it can be life-threatening if it causes you to become severely dehydrated (diabetic ketoacidosis or hyperglycemic hyperosmolar state). Severe hyperglycemia is a medical emergency. What are the causes? If you have diabetes, hyperglycemia may be caused  by:  Diabetes medicine.  Medicines that increase blood glucose or affect your diabetes control.  Not eating enough, or not eating often enough.  Changes in physical activity level.  Being sick or having an  infection. If you have prediabetes or undiagnosed diabetes:  Hyperglycemia may be caused by those conditions. If you do not have diabetes, hyperglycemia may be caused by:  Certain medicines, including steroid medicines, beta-blockers, epinephrine, and thiazide diuretics.  Stress.  Serious illness.  Surgery.  Diseases of the pancreas.  Infection. What increases the risk? Hyperglycemia is more likely to develop in people who have risk factors for diabetes, such as:  Having a family member with diabetes.  Having a gene for type 1 diabetes that is passed from parent to child (inherited).  Living in an area with cold weather conditions.  Exposure to certain viruses.  Certain conditions in which the body's disease-fighting (immune) system attacks itself (autoimmune disorders).  Being overweight or obese.  Having an inactive (sedentary) lifestyle.  Having been diagnosed with insulin resistance.  Having a history of prediabetes, gestational diabetes, or polycystic ovarian syndrome (PCOS).  Being of American-Indian, African-American, Hispanic/Latino, or Asian/Pacific Islander descent. What are the signs or symptoms? Hyperglycemia may not cause any symptoms. If you do have symptoms, they may include early warning signs, such as:  Increased thirst.  Hunger.  Feeling very tired.  Needing to urinate more often than usual.  Blurry vision. Other symptoms may develop if hyperglycemia gets worse, such as:  Dry mouth.  Loss of appetite.  Fruity-smelling breath.  Weakness.  Unexpected or rapid weight gain or weight loss.  Tingling or numbness in the hands or feet.  Headache.  Skin that does not quickly return to normal after being lightly pinched and released (poor skin turgor).  Abdominal pain.  Cuts or bruises that are slow to heal. How is this diagnosed? Hyperglycemia is diagnosed with a blood test to measure your blood glucose level. This blood test is  usually done while you are having symptoms. Your health care provider may also do a physical exam and review your medical history. You may have more tests to determine the cause of your hyperglycemia, such as:  A fasting blood glucose (FBG) test. You will not be allowed to eat (you will fast) for at least 8 hours before a blood sample is taken.  An A1c (hemoglobin A1c) blood test. This provides information about blood glucose control over the previous 2-3 months.  An oral glucose tolerance test (OGTT). This measures your blood glucose at two times: ? After fasting. This is your baseline blood glucose level. ? Two hours after drinking a beverage that contains glucose. How is this treated? Treatment depends on the cause of your hyperglycemia. Treatment may include:  Taking medicine to regulate your blood glucose levels. If you take insulin or other diabetes medicines, your medicine or dosage may be adjusted.  Lifestyle changes, such as exercising more, eating healthier foods, or losing weight.  Treating an illness or infection, if this caused your hyperglycemia.  Checking your blood glucose more often.  Stopping or reducing steroid medicines, if these caused your hyperglycemia. If your hyperglycemia becomes severe and it results in hyperglycemic hyperosmolar state, you must be hospitalized and given IV fluids. Follow these instructions at home:  General instructions  Take over-the-counter and prescription medicines only as told by your health care provider.  Do not use any products that contain nicotine or tobacco, such as cigarettes and e-cigarettes. If you need  help quitting, ask your health care provider.  Limit alcohol intake to no more than 1 drink per day for nonpregnant women and 2 drinks per day for men. One drink equals 12 oz of beer, 5 oz of wine, or 1 oz of hard liquor.  Learn to manage stress. If you need help with this, ask your health care provider.  Keep all follow-up  visits as told by your health care provider. This is important. Eating and drinking   Maintain a healthy weight.  Exercise regularly, as directed by your health care provider.  Stay hydrated, especially when you exercise, get sick, or spend time in hot temperatures.  Eat healthy foods, such as: ? Lean proteins. ? Complex carbohydrates. ? Fresh fruits and vegetables. ? Low-fat dairy products. ? Healthy fats.  Drink enough fluid to keep your urine clear or pale yellow. If you have diabetes:  Make sure you know the symptoms of hyperglycemia.  Follow your diabetes management plan, as told by your health care provider. Make sure you: ? Take your insulin and medicines as directed. ? Follow your exercise plan. ? Follow your meal plan. Eat on time, and do not skip meals. ? Check your blood glucose as often as directed. Make sure to check your blood glucose before and after exercise. If you exercise longer or in a different way than usual, check your blood glucose more often. ? Follow your sick day plan whenever you cannot eat or drink normally. Make this plan in advance with your health care provider.  Share your diabetes management plan with people in your workplace, school, and household.  Check your urine for ketones when you are ill and as told by your health care provider.  Carry a medical alert card or wear medical alert jewelry. Contact a health care provider if:  Your blood glucose is at or above 240 mg/dL (13.3 mmol/L) for 2 days in a row.  You have problems keeping your blood glucose in your target range.  You have frequent episodes of hyperglycemia. Get help right away if:  You have difficulty breathing.  You have a change in how you think, feel, or act (mental status).  You have nausea or vomiting that does not go away. These symptoms may represent a serious problem that is an emergency. Do not wait to see if the symptoms will go away. Get medical help right away.  Call your local emergency services (911 in the [place] Do not drive yourself to the hospital. Summary  Hyperglycemia occurs when the level of sugar (glucose) in the blood is too high.  Hyperglycemia is diagnosed with a blood test to measure your blood glucose level. This blood test is usually done while you are having symptoms. Your health care provider may also do a physical exam and review your medical history.  If you have diabetes, follow your diabetes management plan as told by your health care provider.  Contact your health care provider if you have problems keeping your blood glucose in your target range. This information is not intended to replace advice given to you by your health care provider. Make sure you discuss any questions you have with your health care provider. Document Released: 10/31/2000 Document Revised: 01/23/2016 Document Reviewed: 01/23/2016 Elsevier Interactive Patient Education  2019 [place] on my medicine - ELIQUIS (apixaban)   Why was Eliquis prescribed for you? Eliquis was prescribed to treat blood clots that may have been found in the veins of your legs (deep  vein thrombosis) or in your lungs (pulmonary embolism) and to reduce the risk of them occurring again.  What do You need to know about Eliquis ? The starting dose is 10 mg (two 5 mg tablets) taken TWICE daily for the FIRST SEVEN (7) DAYS, then on 4/14  the dose is reduced to ONE 5 mg tablet taken TWICE daily.  Eliquis may be taken with or without food.   Try to take the dose about the same time in the morning and in the evening. If you have difficulty swallowing the tablet whole please discuss with your pharmacist how to take the medication safely.  Take Eliquis exactly as prescribed and DO NOT stop taking Eliquis without talking to the doctor who prescribed the medication.  Stopping may increase your risk of developing a new blood clot.  Refill your prescription before you run  out.  After discharge, you should have regular check-up appointments with your healthcare provider that is prescribing your Eliquis.    What do you do if you miss a dose? If a dose of ELIQUIS is not taken at the scheduled time, take it as soon as possible on the same day and twice-daily administration should be resumed. The dose should not be doubled to make up for a missed dose.  Important Safety Information A possible side effect of Eliquis is bleeding. You should call your healthcare provider right away if you experience any of the following: ? Bleeding from an injury or your nose that does not stop. ? Unusual colored urine (red or dark brown) or unusual colored stools (red or black). ? Unusual bruising for unknown reasons. ? A serious fall or if you hit your head (even if there is no bleeding).  Some medicines may interact with Eliquis and might increase your risk of bleeding or clotting while on Eliquis. To help avoid this, consult your healthcare provider or pharmacist prior to using any new prescription or non-prescription medications, including herbals, vitamins, non-steroidal anti-inflammatory drugs (NSAIDs) and supplements.  This website has more information on Eliquis (apixaban): http://www.eliquis.com/eliquis/home

## 2018-08-27 NOTE — Progress Notes (Signed)
Spoke with patient by phone. Patient has a prescription for a Freestyle Libre glucose sensor and reader @ [facility] but has not gotten yet. Gave him 1 reader and sensor to use until able to get prescription filled. Patient plans to review application process on video and has complete directions in booklet with the sensor. Patient has no further questions regarding diabetes management @ this time.  Thank you, Nani Gasser. Donetta Isaza, RN, MSN, CDE  Diabetes Coordinator Inpatient Glycemic Control Team Team Pager [phone] (8am-5pm) 08/27/2018 10:13 AM

## 2018-08-27 NOTE — Progress Notes (Signed)
Pt given discharge instructions, prescriptions, and care notes. Pt verbalized understanding AEB no further questions or concerns at this time. IV was discontinued, no redness, pain, or swelling noted at this time.  Pt left the floor via wheelchair with staff in stable condition. Skin dry and intact. 

## 2018-08-27 NOTE — TOC Transition Note (Signed)
Transition of Care [facility]) - CM/SW Discharge Note   Patient Details  Name: DWANE ANDRES MRN: [id number] Date of Birth: [birth year]  [facility] CM/SW Contact:  Sharin Mons, RN Phone Number: 08/27/2018, 12:02 PM   Clinical Narrative:     Transition to home , NCM provided pt with Eliquis $ 10.00 copay card with explanation. Wife to provide transportation to home.  Final next level of care: Home/Self Care Barriers to Discharge: Barriers Resolved   Patient Goals and CMS Choice        Discharge Placement                       Discharge Plan and Services In-house Referral: NA Discharge Planning Services: CM Consult            DME Arranged: N/A DME Agency: NA HH Arranged: NA HH Agency: NA   Social Determinants of Health (SDOH) Interventions     Readmission Risk Interventions No flowsheet data found.

## 2018-08-29 ENCOUNTER — Inpatient Hospital Stay: Payer: Commercial Managed Care - PPO

## 2018-08-29 ENCOUNTER — Encounter: Payer: Self-pay | Admitting: Hematology

## 2018-08-29 ENCOUNTER — Other Ambulatory Visit: Payer: Self-pay | Admitting: Hematology

## 2018-08-29 ENCOUNTER — Other Ambulatory Visit: Payer: Self-pay

## 2018-08-29 ENCOUNTER — Inpatient Hospital Stay (HOSPITAL_BASED_OUTPATIENT_CLINIC_OR_DEPARTMENT_OTHER): Payer: Commercial Managed Care - PPO | Admitting: Hematology

## 2018-08-29 VITALS — BP 117/81 | HR 111 | Temp 98.4°F | Resp 18 | Wt 167.0 lb

## 2018-08-29 VITALS — BP 105/62 | HR 93 | Resp 16

## 2018-08-29 DIAGNOSIS — N179 Acute kidney failure, unspecified: Secondary | ICD-10-CM | POA: Diagnosis not present

## 2018-08-29 DIAGNOSIS — C01 Malignant neoplasm of base of tongue: Secondary | ICD-10-CM

## 2018-08-29 DIAGNOSIS — I82432 Acute embolism and thrombosis of left popliteal vein: Secondary | ICD-10-CM | POA: Diagnosis not present

## 2018-08-29 DIAGNOSIS — D701 Agranulocytosis secondary to cancer chemotherapy: Secondary | ICD-10-CM | POA: Insufficient documentation

## 2018-08-29 DIAGNOSIS — D6481 Anemia due to antineoplastic chemotherapy: Secondary | ICD-10-CM | POA: Diagnosis not present

## 2018-08-29 DIAGNOSIS — R439 Unspecified disturbances of smell and taste: Secondary | ICD-10-CM | POA: Diagnosis not present

## 2018-08-29 DIAGNOSIS — Z95828 Presence of other vascular implants and grafts: Secondary | ICD-10-CM

## 2018-08-29 DIAGNOSIS — C099 Malignant neoplasm of tonsil, unspecified: Secondary | ICD-10-CM | POA: Diagnosis present

## 2018-08-29 DIAGNOSIS — Z79899 Other long term (current) drug therapy: Secondary | ICD-10-CM | POA: Diagnosis not present

## 2018-08-29 DIAGNOSIS — H9319 Tinnitus, unspecified ear: Secondary | ICD-10-CM | POA: Insufficient documentation

## 2018-08-29 DIAGNOSIS — R110 Nausea: Secondary | ICD-10-CM | POA: Insufficient documentation

## 2018-08-29 DIAGNOSIS — T451X5S Adverse effect of antineoplastic and immunosuppressive drugs, sequela: Secondary | ICD-10-CM

## 2018-08-29 DIAGNOSIS — K5900 Constipation, unspecified: Secondary | ICD-10-CM

## 2018-08-29 DIAGNOSIS — Z794 Long term (current) use of insulin: Secondary | ICD-10-CM | POA: Diagnosis not present

## 2018-08-29 DIAGNOSIS — Z7901 Long term (current) use of anticoagulants: Secondary | ICD-10-CM

## 2018-08-29 DIAGNOSIS — I2699 Other pulmonary embolism without acute cor pulmonale: Secondary | ICD-10-CM | POA: Insufficient documentation

## 2018-08-29 DIAGNOSIS — G893 Neoplasm related pain (acute) (chronic): Secondary | ICD-10-CM | POA: Insufficient documentation

## 2018-08-29 DIAGNOSIS — K1231 Oral mucositis (ulcerative) due to antineoplastic therapy: Secondary | ICD-10-CM

## 2018-08-29 DIAGNOSIS — B370 Candidal stomatitis: Secondary | ICD-10-CM

## 2018-08-29 DIAGNOSIS — E871 Hypo-osmolality and hyponatremia: Secondary | ICD-10-CM

## 2018-08-29 DIAGNOSIS — R739 Hyperglycemia, unspecified: Secondary | ICD-10-CM | POA: Insufficient documentation

## 2018-08-29 DIAGNOSIS — E119 Type 2 diabetes mellitus without complications: Secondary | ICD-10-CM

## 2018-08-29 DIAGNOSIS — Z85828 Personal history of other malignant neoplasm of skin: Secondary | ICD-10-CM

## 2018-08-29 DIAGNOSIS — E860 Dehydration: Secondary | ICD-10-CM

## 2018-08-29 DIAGNOSIS — Z931 Gastrostomy status: Secondary | ICD-10-CM | POA: Insufficient documentation

## 2018-08-29 DIAGNOSIS — B3749 Other urogenital candidiasis: Secondary | ICD-10-CM | POA: Insufficient documentation

## 2018-08-29 DIAGNOSIS — E46 Unspecified protein-calorie malnutrition: Secondary | ICD-10-CM | POA: Insufficient documentation

## 2018-08-29 DIAGNOSIS — T451X5A Adverse effect of antineoplastic and immunosuppressive drugs, initial encounter: Secondary | ICD-10-CM

## 2018-08-29 DIAGNOSIS — I82403 Acute embolism and thrombosis of unspecified deep veins of lower extremity, bilateral: Secondary | ICD-10-CM

## 2018-08-29 LAB — BASIC METABOLIC PANEL - CANCER CENTER ONLY
Anion gap: 8 (ref 5–15)
BUN: 19 mg/dL (ref 6–20)
CO2: 29 mmol/L (ref 22–32)
Calcium: 8.4 mg/dL — ABNORMAL LOW (ref 8.9–10.3)
Chloride: 93 mmol/L — ABNORMAL LOW (ref 98–111)
Creatinine: 0.78 mg/dL (ref 0.61–1.24)
GFR, Est AFR Am: >60 mL/min (ref >60)
GFR, Estimated: >60 mL/min (ref >60)
Glucose, Bld: 349 mg/dL — ABNORMAL HIGH (ref 70–99)
Potassium: 4 mmol/L (ref 3.5–5.1)
Sodium: 130 mmol/L — ABNORMAL LOW (ref 135–145)

## 2018-08-29 LAB — CBC WITH DIFFERENTIAL (CANCER CENTER ONLY)
Abs Immature Granulocytes: 0.01 10*3/uL (ref 0.00–0.07)
Basophils Absolute: 0 10*3/uL (ref 0.0–0.1)
Basophils Relative: 1 %
Eosinophils Absolute: 0 10*3/uL (ref 0.0–0.5)
Eosinophils Relative: 0 %
HCT: 29.5 % — ABNORMAL LOW (ref 39.0–52.0)
Hemoglobin: 10 g/dL — ABNORMAL LOW (ref 13.0–17.0)
Immature Granulocytes: 1 %
Lymphocytes Relative: 12 %
Lymphs Abs: 0.2 10*3/uL — ABNORMAL LOW (ref 0.7–4.0)
MCH: 32.8 pg (ref 26.0–34.0)
MCHC: 33.9 g/dL (ref 30.0–36.0)
MCV: 96.7 fL (ref 80.0–100.0)
Monocytes Absolute: 0.3 10*3/uL (ref 0.1–1.0)
Monocytes Relative: 23 %
Neutro Abs: 0.8 10*3/uL — ABNORMAL LOW (ref 1.7–7.7)
Neutrophils Relative %: 63 %
Platelet Count: 211 10*3/uL (ref 150–400)
RBC: 3.05 MIL/uL — ABNORMAL LOW (ref 4.22–5.81)
RDW: 16.8 % — ABNORMAL HIGH (ref 11.5–15.5)
WBC Count: 1.3 10*3/uL — ABNORMAL LOW (ref 4.0–10.5)
nRBC: 2.4 % — ABNORMAL HIGH (ref 0.0–0.2)

## 2018-08-29 LAB — MAGNESIUM: Magnesium: 1.8 mg/dL (ref 1.7–2.4)

## 2018-08-29 MED ORDER — HEPARIN SOD (PORK) LOCK FLUSH 100 UNIT/ML IV SOLN
500.0000 [IU] | Freq: Once | INTRAVENOUS | Status: AC | PRN
  Administered 2018-08-29: 500 [IU]
  Filled 2018-08-29: qty 5

## 2018-08-29 MED ORDER — SODIUM CHLORIDE 0.9 % IV SOLN
Freq: Once | INTRAVENOUS | Status: AC
  Administered 2018-08-29: 11:00:00 via INTRAVENOUS
  Filled 2018-08-29: qty 250

## 2018-08-29 MED ORDER — SODIUM CHLORIDE 0.9% FLUSH
10.0000 mL | Freq: Once | INTRAVENOUS | Status: AC | PRN
  Administered 2018-08-29: 10 mL
  Filled 2018-08-29: qty 10

## 2018-08-29 NOTE — Patient Instructions (Signed)
Dehydration, Adult  Dehydration is when there is not enough fluid or water in your body. This happens when you lose more fluids than you take in. Dehydration can range from mild to very bad. It should be treated right away to keep it from getting very bad. Symptoms of mild dehydration may include:  Thirst.  Dry lips.  Slightly dry mouth.  Dry, warm skin.  Dizziness. Symptoms of moderate dehydration may include:  Very dry mouth.  Muscle cramps.  Dark pee (urine). Pee may be the color of tea.  Your body making less pee.  Your eyes making fewer tears.  Heartbeat that is uneven or faster than normal (palpitations).  Headache.  Light-headedness, especially when you stand up from sitting.  Fainting (syncope). Symptoms of very bad dehydration may include:  Changes in skin, such as: ? Cold and clammy skin. ? Blotchy (mottled) or pale skin. ? Skin that does not quickly return to normal after being lightly pinched and let go (poor skin turgor).  Changes in body fluids, such as: ? Feeling very thirsty. ? Your eyes making fewer tears. ? Not sweating when body temperature is high, such as in hot weather. ? Your body making very little pee.  Changes in vital signs, such as: ? Weak pulse. ? Pulse that is more than 100 beats a minute when you are sitting still. ? Fast breathing. ? Low blood pressure.  Other changes, such as: ? Sunken eyes. ? Cold hands and feet. ? Confusion. ? Lack of energy (lethargy). ? Trouble waking up from sleep. ? Short-term weight loss. ? Unconsciousness. Follow these instructions at home:   If told by your doctor, drink an ORS: ? Make an ORS by using instructions on the package. ? Start by drinking small amounts, about  cup (120 mL) every 5-10 minutes. ? Slowly drink more until you have had the amount that your doctor said to have.  Drink enough clear fluid to keep your pee clear or pale yellow. If you were told to drink an ORS, finish the  ORS first, then start slowly drinking clear fluids. Drink fluids such as: ? Water. Do not drink only water by itself. Doing that can make the salt (sodium) level in your body get too low (hyponatremia). ? Ice chips. ? Fruit juice that you have added water to (diluted). ? Low-calorie sports drinks.  Avoid: ? Alcohol. ? Drinks that have a lot of sugar. These include high-calorie sports drinks, fruit juice that does not have water added, and soda. ? Caffeine. ? Foods that are greasy or have a lot of fat or sugar.  Take over-the-counter and prescription medicines only as told by your doctor.  Do not take salt tablets. Doing that can make the salt level in your body get too high (hypernatremia).  Eat foods that have minerals (electrolytes). Examples include bananas, oranges, potatoes, tomatoes, and spinach.  Keep all follow-up visits as told by your doctor. This is important. Contact a doctor if:  You have belly (abdominal) pain that: ? Gets worse. ? Stays in one area (localizes).  You have a rash.  You have a stiff neck.  You get angry or annoyed more easily than normal (irritability).  You are more sleepy than normal.  You have a harder time waking up than normal.  You feel: ? Weak. ? Dizzy. ? Very thirsty.  You have peed (urinated) only a small amount of very dark pee during 6-8 hours. Get help right away if:  You have   symptoms of very bad dehydration.  You cannot drink fluids without throwing up (vomiting).  Your symptoms get worse with treatment.  You have a fever.  You have a very bad headache.  You are throwing up or having watery poop (diarrhea) and it: ? Gets worse. ? Does not go away.  You have blood or something green (bile) in your throw-up.  You have blood in your poop (stool). This may cause poop to look black and tarry.  You have not peed in 6-8 hours.  You pass out (faint).  Your heart rate when you are sitting still is more than 100 beats a  minute.  You have trouble breathing. This information is not intended to replace advice given to you by your health care provider. Make sure you discuss any questions you have with your health care provider. Document Released: 03/03/2009 Document Revised: 11/25/2015 Document Reviewed: 07/01/2015 Elsevier Interactive Patient Education  2019 [employer]vier Inc.  

## 2018-08-29 NOTE — Progress Notes (Signed)
[name] Care Team: [name], MD as PCP - General (Family Medicine) Melissa Montane, MD as Consulting Physician (Otolaryngology) Eppie Gibson, MD as Attending Physician (Radiation Oncology) Tish Men, MD as Consulting Physician (Hematology) Leota Sauers, RN as Oncology Nurse Navigator Schin[place], [place], [place] as Speech Language Pathologist (Speech Pathology) Karie Mainland, RD as Dietitian (Nutrition) Wynelle Beckmann, Melodie Bouillon, PT as Physical Therapist (Physical Therapy) Kennith Center, LCSW as Social Worker Francina Ames, MD as Referring Physician (Otolaryngology)  HEME/ONC OVERVIEW: 1. Stage I (pT1pN1M0) left tonsil SCCa, p16+ -02/2018: CT neck showed enlarged left Level II and II LN with central necrosis (largest 2.4cm); effacement of the left IJ vein noted  -03/2018: US-guided bx of left cervical LN; path showed SCCa, p16+ -04/2018: PET showed FDG-avid left palatine tonsil lesion measuring ~1.3cm with associated FDG-avid left Level IIa LN's; no evidence of metastatic disease -05/2018: TORS by Dr. Raphael Gibney at [facility]; path showed HPV-positive SCCa of the left glossotonsillar sulcus, 1.6cm, focal margin positive, PNI+, 1/6 LN's positive (largest 6.8cm) with ECE; pT1pN1  -06/2018 - 08/2018: adjuvant chemoradiation with weekly cisplatin  2. Bilateral PTE and LLE DVT -08/2018: admitted for syncope; CTA showed bilateral PTE w/ RV strain s/p tPA; doppler positive for acute DVT involving left popliteal, posterior tibial and peroneal veins  -08/2018 - present: Eliquis   3. Port and PEG placement in 06/2018   TREATMENT SUMMARY:  05/26/2018: TORS by Dr. Nicolette Bang at [facility]   07/07/2018 - 08/21/2018: adjuvant chemoradiation with weekly cisplatin 56m/m2 x 6 doses  08/27/2018 - present: Eliquis 593mBID   ASSESSMENT & PLAN:   Stage I (cT1N1M0) left tonsil SCCa, p16+ -S/p adjuvant chemoradiation, completed on  08/21/2018 -Post-treatment course complicated by DKA and bilateral PTE/LLE DVT -We will plan to obtain end-of-treatment CT neck and chest in ~1-2 months to monitor for any evidence of recurrent disease  -PRN anti-emetics: Zofran, Ativan, and dexamethasone  Bilateral PTE and LLE DVT -New; s/p tPA in 08/2018 due to RV strain  -I reviewed with the Glenn Goodman about the plan for care for DVT and PTE. -This last episode of blood clot appeared to be unprovoked. We discussed about the pros and cons about testing for thrombophilia disorder. His current anticoagulation therapy will interfere with some the tests and it is not possible to interpret the test results. Taking him off the anticoagulation therapy to do the tests may precipitate another thrombotic event. I do not see a reason to order additional testing to screen for thrombophilia disorder as it would not change our management. In the absence of major contraindications, the goal of anticoagulation therapy is lifelong.  --Glenn Goodman is currently tolerating Eliquis without significant side effects; continue Eliquis 37m31mID  -I recommend the Glenn Goodman to use elastic compression stockings at 20-30 mmHg to reduce risks of chronic thrombophlebitis. -Finally, I reinforced the importance of preventive strategies such as avoiding hormonal supplement, avoiding cigarette smoking, keeping up-to-date with screening programs for early cancer detection, frequent ambulation for long distance travel and aggressive DVT prophylaxis in all surgical settings. -Should Glenn Goodman need any interruption of the anticoagulation for elective procedures in the future, feel free to contact me regarding peri-operative management.  Port-a-cath in place -Q6-8weeks flush for now -If scans show NED, we can remove port   Chemotherapy-associated anemia -Secondary to chemotherapy -Hgb 10.0, stable -Glenn Goodman denies any symptom of bleeding -We will monitor it for now  Chemotherapy-associated  leukopenia -Secondary to chemotherapy -WBC 1.3k  with ANC 800, stable -Glenn Goodman denies any symptoms of infection -We will monitor it for now  Hyponatremia -Most likely secondary to decreased fluid intake -I have added IV fluid support (Tues/Thur) x 2 weeks -I also encouraged the Glenn Goodman to slightly increase his salt intake   Chemotherapy-associated mucositis -Secondary to chemotherapy -Currently on IR morphine 68m qhrs PRN -Pain relatively well controlled -Continue PRN morphine, viscous lidocaine and salt/soda rinses   Oral candidiasis -Continue fluconazole, tentatively to complete ~09/05/2018   Chemotherapy-associated nausea  -Secondary to chemotherapy -Symptoms relatively well controlled  -Continue PRN-anti-emetics   Protein malnutrition -Secondary to chemoRT -Glenn Goodman is currently taking in ~6 cans of Osmolite per day with water flushes -IV fluid support as above  -I have also sent a message to the nutritionist to follow up with the Glenn Goodman regarding enteral feeding recommendations   Newly diagnosed DM II -Hgb A1c 10.3 in 08/2018, consistent with previously undiagnosed diabetes  -Glenn Goodman is currently on Lantus qhs and sliding scale lispro -His appt with PCP is scheduled on 09/03/2018 -I encourage the Glenn Goodman to follow up with his PCP for further management of diabetes   Constipation -I recommended the Glenn Goodman to try OTC stool softeners and laxatives -I also counseled against enemas, given the risk of bacterial translocation due to the blood stream, which can lead to serious infections in the setting of neutropenia  No orders of the defined types were placed in this encounter.  All questions were answered. The Glenn Goodman knows to call the clinic with any problems, questions or concerns. No barriers to learning was detected.  A total of more than 40 minutes were spent face-to-face with the Glenn Goodman during this encounter and over half of that time was spent on counseling and  coordination of care as outlined above.   Return in 2 weeks for labs, port flush and clinic appt.   YTish Men MD 08/29/2018 10:17 AM  CHIEF COMPLAINT: "I am okay"  INTERVAL HISTORY: Mr. MPeixotoreturn to clinic for follow-up squamous cell carcinoma of the left tonsil post-adjuvant chemoradiation.  Glenn Goodman was recently discharged from the hospital after being admitted for DKA and found with bilateral PTE and acute LLE DVT.  Given the suspected RV strain with the bilateral PTE, Glenn Goodman underwent TPA with improvement in his respiratory status.  Glenn Goodman reports that over the past few days, Glenn Goodman has had intermittent right-sided throat pain, unprovoked, sharp, nonradiating, lasting 30 seconds, for which she has been taking IR morphine 2-3 times per day with adequate control pain.  Glenn Goodman also has periodic nausea, for which Glenn Goodman takes Zofran with relief of the symptom.  Since discharge, Glenn Goodman has been doing 6 cans of Osmolite per day as well as 8 glasses of water.  His glucose has been ranging from 200 300s.  Glenn Goodman is currently on glargine 18 units at night and Humalog sliding scale 4 times a day.  Glenn Goodman still has some difficulty with constipation, for which Glenn Goodman has taken stool softener and MiraLAX.   SUMMARY OF ONCOLOGIC HISTORY:   Squamous cell carcinoma of left tonsil ([facility] [place]   03/20/2018 Procedure    FNA of the left cervical LN in office    03/20/2018 Pathology Results    Non-diagnostic    04/07/2018 Procedure    US-guided left cervical LN biopsy/FNA     04/07/2018 Pathology Results    (Accession: [id number] Lymph node, needle/core biopsy, left cervical - METASTATIC SQUAMOUS CELL CARCINOMA INVOLVING A LYMPH NODE (1/1) - SEE COMMENT  p16 IHC positive. EBV ISH  negative.     04/22/2018 Pathology Results    (Accession: [id number]) Tonsil, biopsy, left, base of tongue - INVASIVE KERATINIZING SQUAMOUS CELL CARCINOMA.    04/29/2018 Imaging    PET:  IMPRESSION: 1. Left palatine tonsil/adjacent pharyngeal mucosal  space lesion is asymmetric, measures about 1.3 cm in long axis, and has a maximum SUV of 8.2, compatible with malignancy. There is associated hypermetabolic left level IIa adenopathy as shown on prior exams. 2. Symmetric accentuated activity in the lymphoid tissue at the tongue base, maximum SUV 5.3, quite likely physiologic given the symmetry.  3. No findings of metastatic disease Pred to the chest, abdomen/pelvis, or skeleton.    05/26/2018 Surgery    Radical tonsillectomy    05/26/2018 Pathology Results    PROCEDURE: Radical tonsillectomy TUMOR SITE: Glossotonsillar sulcus TUMOR LATERALITY: LEFT TUMOR FOCALITY: Unifocal TUMOR SIZE: GREATEST DIMENSION: 1.6 cm HISTOLOGIC TYPE: HPV-mediated squamous cell carcinoma MARGINS: Involved by invasive tumor (focal) Specify location of closest margin: Tongue base (soft tissue) Note: Although an additional tongue base mucosal margin was submitted, location of tumor extension to margin appears 1 cm from the mucosal surface. LYMPHOVASCULAR INVASION: Not identified PERINEURAL INVASION: Present REGIONAL LYMPH NODES:  NUMBER OF LYMPH NODES INVOLVED: 1  NUMBER OF LYMPH NODES EXAMINED: 6  LATERALITY OF LYMPH NODES INVOLVED: Ipsilateral  SIZE OF LARGEST METASTATIC DEPOSIT: 6.8 cm  EXTRANODAL EXTENSION: Present   Distance from lymph node capsule: 6 millimeters PATHOLOGIC STAGE CLASSIFICATION (pTNM, AJCC 8TH Ed): pT1 pN1 (HPV-mediated oropharynx tumor)  pT1: Tumor 2 cm or smaller in greatest dimension  pN1: Metastasis in 4 or fewer lymph nodes ANCILLARY STUDIES:  p16 expression by immunohistochemistry: Positive  HPV by PCR: pending    05/26/2018 Cancer Staging    Staging form: Pharynx - HPV-Mediated Oropharynx, AJCC 8th Edition - Pathologic stage from 05/26/2018: Stage I (pT1, pN1, cM0, p16+) - Signed by Tish Men, MD on 06/18/2018    07/10/2018 - 07/10/2018 Chemotherapy    The Glenn Goodman had palonosetron (ALOXI) injection  0.25 mg, 0.25 mg, Intravenous,  Once, 0 of 3 cycles CISplatin (PLATINOL) 198 mg in sodium chloride 0.9 % 500 mL chemo infusion, 100 mg/m2 = 198 mg, Intravenous,  Once, 0 of 3 cycles fosaprepitant (EMEND) 150 mg, dexamethasone (DECADRON) 12 mg in sodium chloride 0.9 % 145 mL IVPB, , Intravenous,  Once, 0 of 3 cycles  for chemotherapy treatment.     07/10/2018 -  Chemotherapy    The Glenn Goodman had palonosetron (ALOXI) injection 0.25 mg, 0.25 mg, Intravenous,  Once, 6 of 6 cycles Administration: 0.25 mg (07/10/2018), 0.25 mg (07/18/2018), 0.25 mg (07/24/2018), 0.25 mg (07/31/2018), 0.25 mg (08/07/2018), 0.25 mg (08/14/2018) CISplatin (PLATINOL) 79 mg in sodium chloride 0.9 % 250 mL chemo infusion, 40 mg/m2 = 79 mg, Intravenous,  Once, 6 of 6 cycles Administration: 79 mg (07/10/2018), 79 mg (07/18/2018), 79 mg (07/24/2018), 79 mg (07/31/2018), 79 mg (08/07/2018), 79 mg (08/14/2018) fosaprepitant (EMEND) 150 mg, dexamethasone (DECADRON) 12 mg in sodium chloride 0.9 % 145 mL IVPB, , Intravenous,  Once, 6 of 6 cycles Administration:  (07/10/2018),  (07/18/2018),  (07/24/2018),  (07/31/2018),  (08/07/2018),  (08/14/2018)  for chemotherapy treatment.      Malignant neoplasm of base of tongue ([place])   05/09/2018 Initial Diagnosis    Malignant neoplasm of base of tongue ([place])    05/09/2018 Cancer Staging    Staging form: Pharynx - HPV-Mediated Oropharynx, AJCC 8th Edition - Clinical: Stage I (cT1, cN1, cM0, p16+) - Signed by Eppie Gibson, MD on  05/09/2018    06/11/2018 Cancer Staging    Staging form: Pharynx - HPV-Mediated Oropharynx, AJCC 8th Edition - Pathologic: Stage I (pT1, pN1, cM0, p16+) - Signed by Eppie Gibson, MD on 06/11/2018     REVIEW OF SYSTEMS:   Constitutional: ( - ) fevers, ( - )  chills , ( - ) night sweats Eyes: ( - ) blurriness of vision, ( - ) double vision, ( - ) watery eyes Ears, nose, mouth, throat, and face: (  ) mucositis, ( - ) sore throat Respiratory: ( - ) cough, ( - ) dyspnea, ( - )  wheezes Cardiovascular: ( - ) palpitation, ( - ) chest discomfort, ( - ) lower extremity swelling Gastrointestinal:  ( + ) nausea, ( - ) heartburn, ( + ) change in bowel habits Skin: ( - ) abnormal skin rashes Lymphatics: ( - ) new lymphadenopathy, ( - ) easy bruising Neurological: ( - ) numbness, ( - ) tingling, ( - ) new weaknesses Behavioral/Psych: ( - ) mood change, ( - ) new changes  All other systems were reviewed with the Glenn Goodman and are negative.  I have reviewed the past medical history, past surgical history, social history and family history with the Glenn Goodman and they are unchanged from previous note.  ALLERGIES:  has No Known Allergies.  MEDICATIONS:  Current Outpatient Medications  Medication Sig Dispense Refill  . apixaban (ELIQUIS) 5 MG TABS tablet 55m po bid for 7 days then 556mpo bid , for 30days supply. 60 tablet 0  . bisacodyl (DULCOLAX) 5 MG EC tablet Take 5 mg by mouth 2 (two) times daily.    . blood glucose meter kit and supplies KIT Dispense based on Glenn Goodman and insurance preference. Use up to four times daily as directed. (FOR ICD-9 250.00, 250.01). 1 each 0  . clotrimazole (MYCELEX) 10 MG troche Take 1 lozenge (10 mg total) by mouth 5 (five) times daily for 30 days. 150 lozenge 1  . fluconazole (DIFLUCAN) 100 MG tablet Take 1 tablet (100 mg total) by mouth daily for 3 days. 3 tablet 0  . insulin glargine (LANTUS) 100 unit/mL SOPN Inject 0.18 mLs (18 Units total) into the skin at bedtime for 30 days. 15 mL 0  . insulin lispro (HUMALOG KWIKPEN) 100 UNIT/ML KwikPen Before each meal 3 times a day 140-199 - 2 units; 200-250  4units; 251-299  6 units;  300-349 - 8 units; 350 or above 10 units. 15 mL 0  . Insulin Pen Needle (PEN NEEDLES 3/16") 31G X 5 MM MISC For insulin injection. 100 each 0  . lidocaine (XYLOCAINE) 2 % solution Glenn Goodman: Mix 1part 2% viscous lidocaine, 1part H20. Swish & swallow 1044mf diluted mixture, 55m52mefore meals and at bedtime, up to QID (Glenn Goodman  taking differently: Use as directed 10 mLs in the mouth or throat 4 (four) times daily as needed for mouth pain. Mix 1 part 2% viscous lidocaine, 1 part H20. Swish & swallow 10 mL of diluted mixture, 30 min before meals and at bedtime, up to QID) 100 mL 5  . LORazepam (ATIVAN) 0.5 MG tablet Take 1 tablet (0.5 mg total) by mouth every 6 (six) hours as needed (Nausea or vomiting). 30 tablet 0  . metFORMIN (GLUCOPHAGE) 500 MG tablet Take 1 tablet (500 mg total) by mouth 2 (two) times daily with a meal. 60 tablet 0  . morphine (MSIR) 15 MG tablet Take 1 tablet (15 mg total) by mouth every 4 (four) hours as  needed for severe pain. 60 tablet 0  . Neomycin-Bacitracin-Polymyxin (NEOSPORIN EX) Apply 1 application topically daily as needed (neck wound).    . Nutritional Supplements (FEEDING SUPPLEMENT, OSMOLITE 1.5 CAL,) LIQD Give 1.5 bottles Osmolite 1.5 Bid via PEG and 2 bottles Osmolite 1.5 via PEG for total of 7 bottles daily.  Give 60 cc free water before and after bolus feedings 4 times daily.  In addition consume or give via PEG 240 mL free water 3 times daily.  Please send formula and split gauze to Glenn Goodman.  Glenn Goodman does not need other supplies at this time. (Glenn Goodman taking differently: Take 355.5-474 mLs by mouth See admin instructions. Take 474 ml in the morning, 474 ml at lunch, 355.5 ml at dinner and 355.5 ml at bedtime via PEG for total of 7 bottles daily. Give 60 cc free water before and after bolus feedings 4 times daily.  In addition consume or give via PEG 240 mL free water 3 times daily.  Please send formula and split gauze to Glenn Goodman) 7 Bottle 0  . Nutritional Supplements (PROMOD) LIQD Add 30 mL Promod TID. Continue 7 bottles Osmolite 1.5 daily. (Glenn Goodman taking differently: Take 30 mLs by mouth 3 (three) times daily. ) 2 Bottle 1  . ondansetron (ZOFRAN) 8 MG tablet Take 1 tablet (8 mg total) by mouth 2 (two) times daily as needed. Start on the third day after chemotherapy. (Glenn Goodman taking differently:  Take 8 mg by mouth 2 (two) times daily as needed for nausea or vomiting. Start on the third day after chemotherapy.) 30 tablet 1  . pramipexole (MIRAPEX) 1 MG tablet Take 1 mg by mouth at bedtime.    . silver sulfADIAZINE (SILVADENE) 1 % cream Apply 1 application topically 2 (two) times daily.    . sodium fluoride (PREVIDENT 5000 PLUS) 1.1 % CREA dental cream Place 1 application onto teeth at bedtime. Apply to tooth brush. Brush teeth for 2 minutes. Spit out excess. DO NOT rinse afterwards. Repeat nightly.    . Wound Dressings (SONAFINE EX) Apply 1 application topically daily as needed (neck wound).     No current facility-administered medications for this visit.     PHYSICAL EXAMINATION: ECOG PERFORMANCE STATUS: 1 - Symptomatic but completely ambulatory  Today's Vitals   08/29/18 1000  PainSc: 8    There is no height or weight on file to calculate BMI.  There were no vitals filed for this visit.  GENERAL: alert, no distress and comfortable, slightly frail appearing  SKIN: skin color, texture, turgor are normal, no rashes or significant lesions EYES: conjunctiva are pink and non-injected, sclera clear OROPHARYNX: Grade 1-2 mucositis, trace oral candidiasis  NECK: supple, non-tender LUNGS: clear to auscultation with normal breathing effort HEART: regular rhythm, mild tachycardia, and no murmurs and no lower extremity edema ABDOMEN: soft, non-tender, non-distended, normal bowel sounds Musculoskeletal: no cyanosis of digits and no clubbing  PSYCH: alert & oriented x 3, fluent speech NEURO: no focal motor/sensory deficits  LABORATORY DATA:  I have reviewed the data as listed    Component Value Date/Time   NA 133 (L) 08/27/2018 0409   K 4.2 08/27/2018 0409   CL 97 (L) 08/27/2018 0409   CO2 29 08/27/2018 0409   GLUCOSE 243 (H) 08/27/2018 0409   BUN 20 08/27/2018 0409   CREATININE 0.75 08/27/2018 0409   CREATININE 1.60 (H) 08/20/2018 1408   CALCIUM 8.9 08/27/2018 0409   PROT 5.7  (L) 08/27/2018 0409   ALBUMIN 3.0 (L) 08/27/2018 0409  AST 16 08/27/2018 0409   AST 17 06/18/2018 1105   ALT 55 (H) 08/27/2018 0409   ALT 38 06/18/2018 1105   ALKPHOS 90 08/27/2018 0409   BILITOT 0.6 08/27/2018 0409   BILITOT 0.4 06/18/2018 1105   GFRNONAA >60 08/27/2018 0409   GFRNONAA 46 (L) 08/20/2018 1408   GFRAA >60 08/27/2018 0409   GFRAA 54 (L) 08/20/2018 1408    No results found for: SPEP, UPEP  Lab Results  Component Value Date   WBC 1.3 (L) 08/29/2018   NEUTROABS 0.8 (L) 08/29/2018   HGB 10.0 (L) 08/29/2018   HCT 29.5 (L) 08/29/2018   MCV 96.7 08/29/2018   PLT 211 08/29/2018      Chemistry      Component Value Date/Time   NA 133 (L) 08/27/2018 0409   K 4.2 08/27/2018 0409   CL 97 (L) 08/27/2018 0409   CO2 29 08/27/2018 0409   BUN 20 08/27/2018 0409   CREATININE 0.75 08/27/2018 0409   CREATININE 1.60 (H) 08/20/2018 1408      Component Value Date/Time   CALCIUM 8.9 08/27/2018 0409   ALKPHOS 90 08/27/2018 0409   AST 16 08/27/2018 0409   AST 17 06/18/2018 1105   ALT 55 (H) 08/27/2018 0409   ALT 38 06/18/2018 1105   BILITOT 0.6 08/27/2018 0409   BILITOT 0.4 06/18/2018 1105       RADIOGRAPHIC STUDIES: I have personally reviewed the radiological images as listed below and agreed with the findings in the report. Ct Angio Chest Pe W Or Wo Contrast  Addendum Date: 08/22/2018   ADDENDUM REPORT: 08/22/2018 15:55 ADDENDUM: Critical Value/emergent results were called by telephone at the time of interpretation on 08/22/2018 at 3:52 p.m. to Dr. Florencia Reasons , who verbally acknowledged these results. Electronically Signed   By: Marin Olp M.D.   On: 08/22/2018 15:55   Result Date: 08/22/2018 CLINICAL DATA:  Glenn Goodman received treatment for head neck cancer. Hyperglycemic. Shortness of breath. Assess for pulmonary embolism. EXAM: CT ANGIOGRAPHY CHEST WITH CONTRAST TECHNIQUE: Multidetector CT imaging of the chest was performed using the standard protocol during bolus  administration of intravenous contrast. Multiplanar CT image reconstructions and MIPs were obtained to evaluate the vascular anatomy. CONTRAST:  70 mL OMNIPAQUE IOHEXOL 350 MG/ML SOLN COMPARISON:  Chest CT 07/07/2010 and PET-CT 04/29/2018 FINDINGS: Cardiovascular: Right IJ Port-A-Cath with tip over the cavoatrial junction. Heart is normal size. Thoracic aorta is within normal. Pulmonary arteries are well opacified and demonstrate moderate burden of emboli over the bilateral main pulmonary arteries with small saddle embolus component and emboli extending into the proximal bilateral lobar arteries. Evidence of right heart strain with RV/LV ratio of 52.7/25.8 = 2.0. Remaining vascular structures are unremarkable. Mediastinum/Nodes: No evidence of mediastinal or hilar adenopathy. Remaining mediastinal structures are normal. Lungs/Pleura: Lungs are adequately inflated. There is a small right pleural effusion with minimal associated basilar atelectasis. Airways are normal. Upper Abdomen: No acute findings. Musculoskeletal: No acute findings. Review of the MIP images confirms the above findings. IMPRESSION: Moderate burden bilateral pulmonary emboli within the main pulmonary arteries with small saddle embolus component as emboli extends into the proximal bilateral lobar arteries. Right heart strain with LV ratio of 2.0. Positive for acute PE with CT evidence of right heart strain (RV/LV Ratio = 2.0) consistent with at least submassive (intermediate risk) PE. The presence of right heart strain has been associated with an increased risk of morbidity and mortality. Please activate Code PE by paging [phone]. Small right pleural  effusion with minimal associated right basilar atelectasis. Electronically Signed: By: Marin Olp M.D. On: 08/22/2018 15:41   Ir Angiogram Pulmonary Bilateral Selective  Result Date: 08/23/2018 INDICATION: History of head neck cancer, now with bilateral sub massive pulmonary embolism. Request  made for placement of bilateral infusion catheters for the initiation pulmonary arterial thrombolysis EXAM: 1. ULTRASOUND GUIDANCE FOR VENOUS ACCESS X2 2. PULMONARY ARTERIOGRAPHY 3. FLUOROSCOPIC GUIDED PLACEMENT OF BILATERAL PULMONARY ARTERIAL LYTIC INFUSION CATHETERS COMPARISON:  Chest CTA-08/22/2018 MEDICATIONS: None ANESTHESIA/SEDATION: Moderate (conscious) sedation was employed during this procedure. A total of Versed 1 mg and Fentanyl 50 mcg was administered intravenously. Moderate Sedation Time: 40 minutes. The Glenn Goodman's level of consciousness and vital signs were monitored continuously by radiology nursing throughout the procedure under my direct supervision. CONTRAST:  10 mL OMNIPAQUE IOHEXOL 300 MG/ML  SOLN FLUOROSCOPY TIME:  3 minutes, 36 seconds (24 mGy) COMPLICATIONS: None immediate. TECHNIQUE: Informed written consent was obtained from the Glenn Goodman after a discussion of the risks, benefits and alternatives to treatment. Questions regarding the procedure were encouraged and answered. A timeout was performed prior to the initiation of the procedure. Ultrasound scanning was performed of the right groin and demonstrated wide patency of the right common femoral vein. As such, the right common femoral vein was selected venous access. The right groin was prepped and draped in the usual sterile fashion, and a sterile drape was applied covering the operative field. Maximum barrier sterile technique with sterile gowns and gloves were used for the procedure. A timeout was performed prior to the initiation of the procedure. Local anesthesia was provided with 1% lidocaine. Under direct ultrasound guidance, the right common femoral vein was accessed with a micro puncture kit ultimately allowing placement of a 6 French, 35 cm vascular sheath. Slightly caudal to this initial access, the right common femoral was again accessed with an additional micropuncture kit ultimately allowing placement of an additional 6 French,  35 cm vascular sheath. Ultrasound images were saved for procedural documentation purposes. With the use of a glidewire, a vertebral catheter was advanced into the main pulmonary artery and a limited central pulmonary arteriogram was performed. Pressure measurements were attempted to be obtained from the level the main pulmonary artery however this ultimately proved unsuccessful secondary to measurement device malfunction. The vertebral catheter was advanced into the distal branch of the left lower lobe pulmonary artery. Limited contrast injection confirmed appropriate positioning. Over an exchange length Rosen wire, the vertebral catheter was exchanged for a 105/12 cm multi side-hole EKOS ultrasound assisted infusion catheter. With the use of a glidewire, a vertebral catheter was advanced into a distal branch of the right lower lobe pulmonary artery. Limited contrast injection confirmed appropriate positioning. Over an exchange length Rosen wire, the pigtail catheter was exchanged for a 105/18 cm multi side-hole EKOS ultrasound assisted infusion catheter. A postprocedural fluoroscopic image was obtained to document final catheter positioning. The external catheter tubing was secured at the right thigh and the lytic therapy was initiated. The Glenn Goodman tolerated the procedure well without immediate postprocedural complication. FINDINGS: Limited central pulmonary arteriogram was performed to document appropriate catheter positioning. As above, pressure measurements unable to be obtained secondary to pressure measurement device malfunction. Following the procedure, both ultrasound assisted infusion catheter tips terminate within the distal aspects of the bilateral lower lobe sub segmental pulmonary arteries. IMPRESSION: Successful fluoroscopic guided initiation of bilateral ultrasound assisted catheter directed pulmonary arterial lysis for sub massive pulmonary embolism and right-sided heart strain. PLAN: - Will proceed  with 12  hour bilateral pulmonary arterial catheter directed thrombolysis. - Will discussed appropriateness of placement of an IVC filter in the setting of left lower extremity DVT with providing ICU attending prior to removal of infusion catheters. Electronically Signed   By: Sandi Mariscal M.D.   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 Request made for placement of bilateral infusion catheters for the initiation pulmonary arterial thrombolysis EXAM: 1. ULTRASOUND GUIDANCE FOR VENOUS ACCESS X2 2. PULMONARY ARTERIOGRAPHY 3. FLUOROSCOPIC GUIDED PLACEMENT OF BILATERAL PULMONARY ARTERIAL LYTIC INFUSION CATHETERS COMPARISON:  Chest CTA-08/22/2018 MEDICATIONS: None ANESTHESIA/SEDATION: Moderate (conscious) sedation was employed during this procedure. A total of Versed 1 mg and Fentanyl 50 mcg was administered intravenously. Moderate Sedation Time: 40 minutes. The Glenn Goodman's level of consciousness and vital signs were monitored continuously by radiology nursing throughout the procedure under my direct supervision. CONTRAST:  10 mL OMNIPAQUE IOHEXOL 300 MG/ML  SOLN FLUOROSCOPY TIME:  3 minutes, 36 seconds (24 mGy) COMPLICATIONS: None immediate. TECHNIQUE: Informed written consent was obtained from the Glenn Goodman after a discussion of the risks, benefits and alternatives to treatment. Questions regarding the procedure were encouraged and answered. A timeout was performed prior to the initiation of the procedure. Ultrasound scanning was performed of the right groin and demonstrated wide patency of the right common femoral vein. As such, the right common femoral vein was selected venous access. The right groin was prepped and draped in the usual sterile fashion, and a sterile drape was applied covering the operative field. Maximum barrier sterile technique with sterile gowns and gloves  were used for the procedure. A timeout was performed prior to the initiation of the procedure. Local anesthesia was provided with 1% lidocaine. Under direct ultrasound guidance, the right common femoral vein was accessed with a micro puncture kit ultimately allowing placement of a 6 French, 35 cm vascular sheath. Slightly caudal to this initial access, the right common femoral was again accessed with an additional micropuncture kit ultimately allowing placement of an additional 6 French, 35 cm vascular sheath. Ultrasound images were saved for procedural documentation purposes. With the use of a glidewire, a vertebral catheter was advanced into the main pulmonary artery and a limited central pulmonary arteriogram was performed. Pressure measurements were attempted to be obtained from the level the main pulmonary artery however this ultimately proved unsuccessful secondary to measurement device malfunction. The vertebral catheter was advanced into the distal branch of the left lower lobe pulmonary artery. Limited contrast injection confirmed appropriate positioning. Over an exchange length Rosen wire, the vertebral catheter was exchanged for a 105/12 cm multi side-hole EKOS ultrasound assisted infusion catheter. With the use of a glidewire, a vertebral catheter was advanced into a distal branch of the right lower lobe pulmonary artery. Limited contrast injection confirmed appropriate positioning. Over an exchange length Rosen wire, the pigtail catheter was exchanged for a 105/18 cm multi side-hole EKOS ultrasound assisted infusion catheter. A postprocedural fluoroscopic image was obtained to document final catheter positioning. The external catheter tubing was secured at the right thigh and the lytic therapy was initiated. The Glenn Goodman tolerated the procedure well without immediate postprocedural complication. FINDINGS: Limited central pulmonary arteriogram was performed to document appropriate catheter positioning. As  above, pressure measurements unable to be obtained secondary to pressure measurement device malfunction. Following the procedure, both ultrasound assisted infusion catheter tips terminate within the distal aspects of the bilateral lower lobe sub  segmental pulmonary arteries. IMPRESSION: Successful fluoroscopic guided initiation of bilateral ultrasound assisted catheter directed pulmonary arterial lysis for sub massive pulmonary embolism and right-sided heart strain. PLAN: - Will proceed with 12 hour bilateral pulmonary arterial catheter directed thrombolysis. - Will discussed appropriateness of placement of an IVC filter in the setting of left lower extremity DVT with providing ICU attending prior to removal of infusion catheters. Electronically Signed   By: Sandi Mariscal M.D.   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 Request made for placement of bilateral infusion catheters for the initiation pulmonary arterial thrombolysis EXAM: 1. ULTRASOUND GUIDANCE FOR VENOUS ACCESS X2 2. PULMONARY ARTERIOGRAPHY 3. FLUOROSCOPIC GUIDED PLACEMENT OF BILATERAL PULMONARY ARTERIAL LYTIC INFUSION CATHETERS COMPARISON:  Chest CTA-08/22/2018 MEDICATIONS: None ANESTHESIA/SEDATION: Moderate (conscious) sedation was employed during this procedure. A total of Versed 1 mg and Fentanyl 50 mcg was administered intravenously. Moderate Sedation Time: 40 minutes. The Glenn Goodman's level of consciousness and vital signs were monitored continuously by radiology nursing throughout the procedure under my direct supervision. CONTRAST:  10 mL OMNIPAQUE IOHEXOL 300 MG/ML  SOLN FLUOROSCOPY TIME:  3 minutes, 36 seconds (24 mGy) COMPLICATIONS: None immediate. TECHNIQUE: Informed written consent was obtained from the Glenn Goodman after a discussion of the risks, benefits and alternatives to treatment. Questions  regarding the procedure were encouraged and answered. A timeout was performed prior to the initiation of the procedure. Ultrasound scanning was performed of the right groin and demonstrated wide patency of the right common femoral vein. As such, the right common femoral vein was selected venous access. The right groin was prepped and draped in the usual sterile fashion, and a sterile drape was applied covering the operative field. Maximum barrier sterile technique with sterile gowns and gloves were used for the procedure. A timeout was performed prior to the initiation of the procedure. Local anesthesia was provided with 1% lidocaine. Under direct ultrasound guidance, the right common femoral vein was accessed with a micro puncture kit ultimately allowing placement of a 6 French, 35 cm vascular sheath. Slightly caudal to this initial access, the right common femoral was again accessed with an additional micropuncture kit ultimately allowing placement of an additional 6 French, 35 cm vascular sheath. Ultrasound images were saved for procedural documentation purposes. With the use of a glidewire, a vertebral catheter was advanced into the main pulmonary artery and a limited central pulmonary arteriogram was performed. Pressure measurements were attempted to be obtained from the level the main pulmonary artery however this ultimately proved unsuccessful secondary to measurement device malfunction. The vertebral catheter was advanced into the distal branch of the left lower lobe pulmonary artery. Limited contrast injection confirmed appropriate positioning. Over an exchange length Rosen wire, the vertebral catheter was exchanged for a 105/12 cm multi side-hole EKOS ultrasound assisted infusion catheter. With the use of a glidewire, a vertebral catheter was advanced into a distal branch of the right lower lobe pulmonary artery. Limited contrast injection confirmed appropriate positioning. Over an exchange length Rosen  wire, the pigtail catheter was exchanged for a 105/18 cm multi side-hole EKOS ultrasound assisted infusion catheter. A postprocedural fluoroscopic image was obtained to document final catheter positioning. The external catheter tubing was secured at the right thigh and the lytic therapy was initiated. The Glenn Goodman tolerated the procedure well without immediate postprocedural complication. FINDINGS: Limited central pulmonary arteriogram was performed to document appropriate catheter positioning. As above,  pressure measurements unable to be obtained secondary to pressure measurement device malfunction. Following the procedure, both ultrasound assisted infusion catheter tips terminate within the distal aspects of the bilateral lower lobe sub segmental pulmonary arteries. IMPRESSION: Successful fluoroscopic guided initiation of bilateral ultrasound assisted catheter directed pulmonary arterial lysis for sub massive pulmonary embolism and right-sided heart strain. PLAN: - Will proceed with 12 hour bilateral pulmonary arterial catheter directed thrombolysis. - Will discussed appropriateness of placement of an IVC filter in the setting of left lower extremity DVT with providing ICU attending prior to removal of infusion catheters. Electronically Signed   By: Sandi Mariscal M.D.   On: 08/23/2018 08:38   US Abdomen Limited  Result Date: 08/22/2018 CLINICAL DATA:  Elevated LFTs EXAM: ULTRASOUND ABDOMEN LIMITED RIGHT UPPER QUADRANT COMPARISON:  None. FINDINGS: Gallbladder: No visible gallstones. Gallbladder wall is mildly thickened at 6 mm. Negative sonographic Murphy's. Common bile duct: Diameter: Normal caliber, 3 mm Liver: Increased echotexture compatible with fatty infiltration. No focal abnormality or biliary ductal dilatation. Portal vein is patent on color Doppler imaging with normal direction of blood flow towards the liver. IMPRESSION: Fatty infiltration of the liver. Gallbladder wall thickening without visible stones  or sonographic Murphy sign. This may be related to liver disease or chronic cholecystitis. Electronically Signed   By: Rolm Baptise M.D.   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 Request made for placement of bilateral infusion catheters for the initiation pulmonary arterial thrombolysis EXAM: 1. ULTRASOUND GUIDANCE FOR VENOUS ACCESS X2 2. PULMONARY ARTERIOGRAPHY 3. FLUOROSCOPIC GUIDED PLACEMENT OF BILATERAL PULMONARY ARTERIAL LYTIC INFUSION CATHETERS COMPARISON:  Chest CTA-08/22/2018 MEDICATIONS: None ANESTHESIA/SEDATION: Moderate (conscious) sedation was employed during this procedure. A total of Versed 1 mg and Fentanyl 50 mcg was administered intravenously. Moderate Sedation Time: 40 minutes. The Glenn Goodman's level of consciousness and vital signs were monitored continuously by radiology nursing throughout the procedure under my direct supervision. CONTRAST:  10 mL OMNIPAQUE IOHEXOL 300 MG/ML  SOLN FLUOROSCOPY TIME:  3 minutes, 36 seconds (24 mGy) COMPLICATIONS: None immediate. TECHNIQUE: Informed written consent was obtained from the Glenn Goodman after a discussion of the risks, benefits and alternatives to treatment. Questions regarding the procedure were encouraged and answered. A timeout was performed prior to the initiation of the procedure. Ultrasound scanning was performed of the right groin and demonstrated wide patency of the right common femoral vein. As such, the right common femoral vein was selected venous access. The right groin was prepped and draped in the usual sterile fashion, and a sterile drape was applied covering the operative field. Maximum barrier sterile technique with sterile gowns and gloves were used for the procedure. A timeout was performed prior to the initiation of the procedure. Local anesthesia was provided with 1% lidocaine. Under direct ultrasound  guidance, the right common femoral vein was accessed with a micro puncture kit ultimately allowing placement of a 6 French, 35 cm vascular sheath. Slightly caudal to this initial access, the right common femoral was again accessed with an additional micropuncture kit ultimately allowing placement of an additional 6 French, 35 cm vascular sheath. Ultrasound images were saved for procedural documentation purposes. With the use of a glidewire, a vertebral catheter was advanced into the main pulmonary artery and a limited central pulmonary arteriogram was performed. Pressure measurements were attempted to be obtained from the level the main pulmonary artery however this ultimately proved  unsuccessful secondary to measurement device malfunction. The vertebral catheter was advanced into the distal branch of the left lower lobe pulmonary artery. Limited contrast injection confirmed appropriate positioning. Over an exchange length Rosen wire, the vertebral catheter was exchanged for a 105/12 cm multi side-hole EKOS ultrasound assisted infusion catheter. With the use of a glidewire, a vertebral catheter was advanced into a distal branch of the right lower lobe pulmonary artery. Limited contrast injection confirmed appropriate positioning. Over an exchange length Rosen wire, the pigtail catheter was exchanged for a 105/18 cm multi side-hole EKOS ultrasound assisted infusion catheter. A postprocedural fluoroscopic image was obtained to document final catheter positioning. The external catheter tubing was secured at the right thigh and the lytic therapy was initiated. The Glenn Goodman tolerated the procedure well without immediate postprocedural complication. FINDINGS: Limited central pulmonary arteriogram was performed to document appropriate catheter positioning. As above, pressure measurements unable to be obtained secondary to pressure measurement device malfunction. Following the procedure, both ultrasound assisted infusion  catheter tips terminate within the distal aspects of the bilateral lower lobe sub segmental pulmonary arteries. IMPRESSION: Successful fluoroscopic guided initiation of bilateral ultrasound assisted catheter directed pulmonary arterial lysis for sub massive pulmonary embolism and right-sided heart strain. PLAN: - Will proceed with 12 hour bilateral pulmonary arterial catheter directed thrombolysis. - Will discussed appropriateness of placement of an IVC filter in the setting of left lower extremity DVT with providing ICU attending prior to removal of infusion catheters. Electronically Signed   By: Sandi Mariscal M.D.   On: 08/23/2018 08:38   Dg Chest Port 1 View  Result Date: 08/23/2018 CLINICAL DATA:  Post initiation of bilateral pulmonary arterial catheter directed thrombolysis. Evaluate pulmonary infusion catheter positioning. EXAM: PORTABLE CHEST 1 VIEW COMPARISON:  Chest radiograph - 08/21/2018; initiation of bilateral pulmonary arterial catheter directed thrombolysis - 08/22/2018; chest CT - 08/22/2018 FINDINGS: Grossly unchanged cardiac silhouette and mediastinal contours. Unchanged positioning of bilateral pulmonary arterial infusion catheters with tips overlying the expected location of the bilateral lower lobe subsegmental pulmonary arteries the right approximately 15 cm from the expected location of the main pulmonary artery and the left approximately 17 cm. Stable positioning of right jugular approach port a catheter with tip projected over the superior cavoatrial junction. No focal airspace opacities. No pleural effusion or pneumothorax no evidence of edema. No acute osseous abnormalities. IMPRESSION: Unchanged positioning of bilateral pulmonary arterial lytic infusion catheters. Electronically Signed   By: Sandi Mariscal M.D.   On: 08/23/2018 09:00   Dg Chest Port 1 View  Result Date: 08/21/2018 CLINICAL DATA:  Head neck cancer. Hyperglycemia. EXAM: PORTABLE CHEST 1 VIEW COMPARISON:  None. FINDINGS:  The heart size and mediastinal contours are within normal limits. Both lungs are clear. The visualized skeletal structures are unremarkable. Port-A-Cath tip proximal RIGHT atrium. IMPRESSION: No active disease. Electronically Signed   By: Staci Righter M.D.   On: 08/21/2018 18:42   Vas [place] Lower Extremity Venous (dvt)  Result Date: 08/24/2018  Lower Venous Study Indications: Pulmonary embolism.  Performing Technologist: Oliver Hum RVT  Examination Guidelines: A complete evaluation includes B-mode imaging, spectral Doppler, color Doppler, and power Doppler as needed of all accessible portions of each vessel. Bilateral testing is considered an integral part of a complete examination. Limited examinations for reoccurring indications may be performed as noted.  Right Venous Findings: +---------+---------------+---------+-----------+----------+-------+          CompressibilityPhasicitySpontaneityPropertiesSummary +---------+---------------+---------+-----------+----------+-------+ CFV      Full  Yes      Yes                          +---------+---------------+---------+-----------+----------+-------+ SFJ      Full                                                 +---------+---------------+---------+-----------+----------+-------+ FV Prox  Full                                                 +---------+---------------+---------+-----------+----------+-------+ FV Mid   Full                                                 +---------+---------------+---------+-----------+----------+-------+ FV DistalFull                                                 +---------+---------------+---------+-----------+----------+-------+ PFV      Full                                                 +---------+---------------+---------+-----------+----------+-------+ POP      Full           Yes      Yes                           +---------+---------------+---------+-----------+----------+-------+ PTV      Full                                                 +---------+---------------+---------+-----------+----------+-------+ PERO     Full                                                 +---------+---------------+---------+-----------+----------+-------+  Left Venous Findings: +---------+---------------+---------+-----------+----------+-------+          CompressibilityPhasicitySpontaneityPropertiesSummary +---------+---------------+---------+-----------+----------+-------+ CFV      Full           No       No                           +---------+---------------+---------+-----------+----------+-------+ SFJ      Full                                                 +---------+---------------+---------+-----------+----------+-------+ FV Prox  Full                                                 +---------+---------------+---------+-----------+----------+-------+  FV Mid   Full                                                 +---------+---------------+---------+-----------+----------+-------+ FV DistalFull                                                 +---------+---------------+---------+-----------+----------+-------+ PFV      Full                                                 +---------+---------------+---------+-----------+----------+-------+ POP      Partial        No       No                   Acute   +---------+---------------+---------+-----------+----------+-------+ PTV      Partial                                      Acute   +---------+---------------+---------+-----------+----------+-------+ PERO     None                                         Acute   +---------+---------------+---------+-----------+----------+-------+ Gastroc  Full                                                  +---------+---------------+---------+-----------+----------+-------+    Summary: Right: There is no evidence of deep vein thrombosis in the lower extremity. Left: Findings consistent with acute deep vein thrombosis involving the left popliteal vein, left posterior tibial vein, and left peroneal vein. No cystic structure found in the popliteal fossa.  *See table(s) above for measurements and observations. Electronically signed by Monica Martinez MD on 08/24/2018 at 9:04:47 AM.    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 Request made for placement of bilateral infusion catheters for the initiation pulmonary arterial thrombolysis EXAM: 1. ULTRASOUND GUIDANCE FOR VENOUS ACCESS X2 2. PULMONARY ARTERIOGRAPHY 3. FLUOROSCOPIC GUIDED PLACEMENT OF BILATERAL PULMONARY ARTERIAL LYTIC INFUSION CATHETERS COMPARISON:  Chest CTA-08/22/2018 MEDICATIONS: None ANESTHESIA/SEDATION: Moderate (conscious) sedation was employed during this procedure. A total of Versed 1 mg and Fentanyl 50 mcg was administered intravenously. Moderate Sedation Time: 40 minutes. The Glenn Goodman's level of consciousness and vital signs were monitored continuously by radiology nursing throughout the procedure under my direct supervision. CONTRAST:  10 mL OMNIPAQUE IOHEXOL 300 MG/ML  SOLN FLUOROSCOPY TIME:  3 minutes, 36 seconds (24 mGy) COMPLICATIONS: None immediate. TECHNIQUE: Informed written consent was obtained from the Glenn Goodman after a discussion of the risks, benefits and alternatives to treatment. Questions regarding the procedure were encouraged and answered. A timeout was performed prior to the initiation of  the procedure. Ultrasound scanning was performed of the right groin and demonstrated wide patency of the right common femoral vein. As such, the right common femoral vein was selected venous access. The right groin was  prepped and draped in the usual sterile fashion, and a sterile drape was applied covering the operative field. Maximum barrier sterile technique with sterile gowns and gloves were used for the procedure. A timeout was performed prior to the initiation of the procedure. Local anesthesia was provided with 1% lidocaine. Under direct ultrasound guidance, the right common femoral vein was accessed with a micro puncture kit ultimately allowing placement of a 6 French, 35 cm vascular sheath. Slightly caudal to this initial access, the right common femoral was again accessed with an additional micropuncture kit ultimately allowing placement of an additional 6 French, 35 cm vascular sheath. Ultrasound images were saved for procedural documentation purposes. With the use of a glidewire, a vertebral catheter was advanced into the main pulmonary artery and a limited central pulmonary arteriogram was performed. Pressure measurements were attempted to be obtained from the level the main pulmonary artery however this ultimately proved unsuccessful secondary to measurement device malfunction. The vertebral catheter was advanced into the distal branch of the left lower lobe pulmonary artery. Limited contrast injection confirmed appropriate positioning. Over an exchange length Rosen wire, the vertebral catheter was exchanged for a 105/12 cm multi side-hole EKOS ultrasound assisted infusion catheter. With the use of a glidewire, a vertebral catheter was advanced into a distal branch of the right lower lobe pulmonary artery. Limited contrast injection confirmed appropriate positioning. Over an exchange length Rosen wire, the pigtail catheter was exchanged for a 105/18 cm multi side-hole EKOS ultrasound assisted infusion catheter. A postprocedural fluoroscopic image was obtained to document final catheter positioning. The external catheter tubing was secured at the right thigh and the lytic therapy was initiated. The Glenn Goodman tolerated  the procedure well without immediate postprocedural complication. FINDINGS: Limited central pulmonary arteriogram was performed to document appropriate catheter positioning. As above, pressure measurements unable to be obtained secondary to pressure measurement device malfunction. Following the procedure, both ultrasound assisted infusion catheter tips terminate within the distal aspects of the bilateral lower lobe sub segmental pulmonary arteries. IMPRESSION: Successful fluoroscopic guided initiation of bilateral ultrasound assisted catheter directed pulmonary arterial lysis for sub massive pulmonary embolism and right-sided heart strain. PLAN: - Will proceed with 12 hour bilateral pulmonary arterial catheter directed thrombolysis. - Will discussed appropriateness of placement of an IVC filter in the setting of left lower extremity DVT with providing ICU attending prior to removal of infusion catheters. Electronically Signed   By: Sandi Mariscal M.D.   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 Request made for placement of bilateral infusion catheters for the initiation pulmonary arterial thrombolysis EXAM: 1. ULTRASOUND GUIDANCE FOR VENOUS ACCESS X2 2. PULMONARY ARTERIOGRAPHY 3. FLUOROSCOPIC GUIDED PLACEMENT OF BILATERAL PULMONARY ARTERIAL LYTIC INFUSION CATHETERS COMPARISON:  Chest CTA-08/22/2018 MEDICATIONS: None ANESTHESIA/SEDATION: Moderate (conscious) sedation was employed during this procedure. A total of Versed 1 mg and Fentanyl 50 mcg was administered intravenously. Moderate Sedation Time: 40 minutes. The Glenn Goodman's level of consciousness and vital signs were monitored continuously by radiology nursing throughout the procedure under my direct supervision. CONTRAST:  10 mL OMNIPAQUE IOHEXOL 300 MG/ML  SOLN FLUOROSCOPY TIME:  3 minutes, 36 seconds (24 mGy) COMPLICATIONS:  None immediate. TECHNIQUE: Informed written consent  was obtained from the Glenn Goodman after a discussion of the risks, benefits and alternatives to treatment. Questions regarding the procedure were encouraged and answered. A timeout was performed prior to the initiation of the procedure. Ultrasound scanning was performed of the right groin and demonstrated wide patency of the right common femoral vein. As such, the right common femoral vein was selected venous access. The right groin was prepped and draped in the usual sterile fashion, and a sterile drape was applied covering the operative field. Maximum barrier sterile technique with sterile gowns and gloves were used for the procedure. A timeout was performed prior to the initiation of the procedure. Local anesthesia was provided with 1% lidocaine. Under direct ultrasound guidance, the right common femoral vein was accessed with a micro puncture kit ultimately allowing placement of a 6 French, 35 cm vascular sheath. Slightly caudal to this initial access, the right common femoral was again accessed with an additional micropuncture kit ultimately allowing placement of an additional 6 French, 35 cm vascular sheath. Ultrasound images were saved for procedural documentation purposes. With the use of a glidewire, a vertebral catheter was advanced into the main pulmonary artery and a limited central pulmonary arteriogram was performed. Pressure measurements were attempted to be obtained from the level the main pulmonary artery however this ultimately proved unsuccessful secondary to measurement device malfunction. The vertebral catheter was advanced into the distal branch of the left lower lobe pulmonary artery. Limited contrast injection confirmed appropriate positioning. Over an exchange length Rosen wire, the vertebral catheter was exchanged for a 105/12 cm multi side-hole EKOS ultrasound assisted infusion catheter. With the use of a glidewire, a vertebral catheter was  advanced into a distal branch of the right lower lobe pulmonary artery. Limited contrast injection confirmed appropriate positioning. Over an exchange length Rosen wire, the pigtail catheter was exchanged for a 105/18 cm multi side-hole EKOS ultrasound assisted infusion catheter. A postprocedural fluoroscopic image was obtained to document final catheter positioning. The external catheter tubing was secured at the right thigh and the lytic therapy was initiated. The Glenn Goodman tolerated the procedure well without immediate postprocedural complication. FINDINGS: Limited central pulmonary arteriogram was performed to document appropriate catheter positioning. As above, pressure measurements unable to be obtained secondary to pressure measurement device malfunction. Following the procedure, both ultrasound assisted infusion catheter tips terminate within the distal aspects of the bilateral lower lobe sub segmental pulmonary arteries. IMPRESSION: Successful fluoroscopic guided initiation of bilateral ultrasound assisted catheter directed pulmonary arterial lysis for sub massive pulmonary embolism and right-sided heart strain. PLAN: - Will proceed with 12 hour bilateral pulmonary arterial catheter directed thrombolysis. - Will discussed appropriateness of placement of an IVC filter in the setting of left lower extremity DVT with providing ICU attending prior to removal of infusion catheters. Electronically Signed   By: Sandi Mariscal M.D.   On: 08/23/2018 08:38   Ir Jacolyn Reedy F/u Eval Art/ven Final Day (ms)  Result Date: 08/23/2018 INDICATION: History of sub massive pulmonary embolism, post initiation of bilateral catheter directed pulmonary arterial thrombolysis. Request made for pulmonary arterial pressure measurement prior to removal of infusion catheters. Discussion of appropriateness of proceeding with IVC filter placement for temporary caval interruption purposes was discussed with ICU attending, Dr. Chase Caller, however  the decision was made not to proceed with filter placement this time as the Glenn Goodman appears to be tolerating initiated anticoagulation without incident. EXAM: BEDSIDE PULMONARY ARTERIAL PRESSURE MEASUREMENT AND PULMONARY INFUSION CATHETER REMOVAL COMPARISON:  Chest radiograph - earlier same day; Initiation of  bilateral pulmonary arterial catheter directed thrombolysis - 08/22/2018; chest CT-08/22/2018 MEDICATIONS: None CONTRAST:  None FLUOROSCOPY TIME:  None COMPLICATIONS: None immediate. TECHNIQUE: Review of chest radiograph performed earlier this morning demonstrates unchanged positioning of bilateral pulmonary arterial infusion catheters. The Glenn Goodman was positioned supine on his hospital bed. The right pulmonary arterial catheter infusion wire was removed and the multi side-hole infusion catheter was retracted approximately 15 cm to the level of the main pulmonary artery. Pressure measurements were acquired from this location. At this point, the procedure was terminated. All wires, catheters and sheaths were removed from the Glenn Goodman. Hemostasis was achieved at the right groin access site with manual compression. A dressing was placed. The Glenn Goodman tolerated the procedure well without immediate postprocedural complication. FINDINGS: Preprocedural spot chest radiograph demonstrates unchanged positioning of bilateral pulmonary arterial infusion catheters. The right pulmonary arterial infusion catheter was retracted approximately 15 cm to the level of the main pulmonary artery. Acquired pressure measurements as follows: Preprocedural main pulmonary artery - unable to be obtained secondary to device malfunction. Postprocedural main pulmonary artery - 44/26; mean - 33 IMPRESSION: Technically successful completion of bilateral catheter directed pulmonary arterial thrombolysis. Electronically Signed   By: Sandi Mariscal M.D.   On: 08/23/2018 10:14

## 2018-08-29 NOTE — Patient Instructions (Signed)

## 2018-08-30 LAB — CULTURE, BLOOD (ROUTINE X 2)
Culture: NO GROWTH
Culture: NO GROWTH
Special Requests: ADEQUATE
Special Requests: ADEQUATE

## 2018-09-02 ENCOUNTER — Inpatient Hospital Stay: Payer: Commercial Managed Care - PPO

## 2018-09-02 ENCOUNTER — Other Ambulatory Visit: Payer: Self-pay

## 2018-09-02 VITALS — BP 109/65 | HR 92 | Temp 98.0°F | Resp 16

## 2018-09-02 DIAGNOSIS — Z931 Gastrostomy status: Secondary | ICD-10-CM | POA: Diagnosis not present

## 2018-09-02 DIAGNOSIS — C099 Malignant neoplasm of tonsil, unspecified: Secondary | ICD-10-CM | POA: Diagnosis not present

## 2018-09-02 DIAGNOSIS — Z95828 Presence of other vascular implants and grafts: Secondary | ICD-10-CM

## 2018-09-02 DIAGNOSIS — C01 Malignant neoplasm of base of tongue: Secondary | ICD-10-CM | POA: Diagnosis not present

## 2018-09-02 MED ORDER — HEPARIN SOD (PORK) LOCK FLUSH 100 UNIT/ML IV SOLN
500.0000 [IU] | Freq: Once | INTRAVENOUS | Status: AC | PRN
  Administered 2018-09-02: 16:00:00 500 [IU]
  Filled 2018-09-02: qty 5

## 2018-09-02 MED ORDER — SODIUM CHLORIDE 0.9% FLUSH
10.0000 mL | Freq: Once | INTRAVENOUS | Status: AC | PRN
  Administered 2018-09-02: 10 mL
  Filled 2018-09-02: qty 10

## 2018-09-02 MED ORDER — SODIUM CHLORIDE 0.9 % IV SOLN
Freq: Once | INTRAVENOUS | Status: AC
  Administered 2018-09-02: 14:00:00 via INTRAVENOUS
  Filled 2018-09-02: qty 250

## 2018-09-02 NOTE — Patient Instructions (Signed)
Dehydration, Adult  Dehydration is when there is not enough fluid or water in your body. This happens when you lose more fluids than you take in. Dehydration can range from mild to very bad. It should be treated right away to keep it from getting very bad. Symptoms of mild dehydration may include:  Thirst.  Dry lips.  Slightly dry mouth.  Dry, warm skin.  Dizziness. Symptoms of moderate dehydration may include:  Very dry mouth.  Muscle cramps.  Dark pee (urine). Pee may be the color of tea.  Your body making less pee.  Your eyes making fewer tears.  Heartbeat that is uneven or faster than normal (palpitations).  Headache.  Light-headedness, especially when you stand up from sitting.  Fainting (syncope). Symptoms of very bad dehydration may include:  Changes in skin, such as: ? Cold and clammy skin. ? Blotchy (mottled) or pale skin. ? Skin that does not quickly return to normal after being lightly pinched and let go (poor skin turgor).  Changes in body fluids, such as: ? Feeling very thirsty. ? Your eyes making fewer tears. ? Not sweating when body temperature is high, such as in hot weather. ? Your body making very little pee.  Changes in vital signs, such as: ? Weak pulse. ? Pulse that is more than 100 beats a minute when you are sitting still. ? Fast breathing. ? Low blood pressure.  Other changes, such as: ? Sunken eyes. ? Cold hands and feet. ? Confusion. ? Lack of energy (lethargy). ? Trouble waking up from sleep. ? Short-term weight loss. ? Unconsciousness. Follow these instructions at home:   If told by your doctor, drink an ORS: ? Make an ORS by using instructions on the package. ? Start by drinking small amounts, about  cup (120 mL) every 5-10 minutes. ? Slowly drink more until you have had the amount that your doctor said to have.  Drink enough clear fluid to keep your pee clear or pale yellow. If you were told to drink an ORS, finish the  ORS first, then start slowly drinking clear fluids. Drink fluids such as: ? Water. Do not drink only water by itself. Doing that can make the salt (sodium) level in your body get too low (hyponatremia). ? Ice chips. ? Fruit juice that you have added water to (diluted). ? Low-calorie sports drinks.  Avoid: ? Alcohol. ? Drinks that have a lot of sugar. These include high-calorie sports drinks, fruit juice that does not have water added, and soda. ? Caffeine. ? Foods that are greasy or have a lot of fat or sugar.  Take over-the-counter and prescription medicines only as told by your doctor.  Do not take salt tablets. Doing that can make the salt level in your body get too high (hypernatremia).  Eat foods that have minerals (electrolytes). Examples include bananas, oranges, potatoes, tomatoes, and spinach.  Keep all follow-up visits as told by your doctor. This is important. Contact a doctor if:  You have belly (abdominal) pain that: ? Gets worse. ? Stays in one area (localizes).  You have a rash.  You have a stiff neck.  You get angry or annoyed more easily than normal (irritability).  You are more sleepy than normal.  You have a harder time waking up than normal.  You feel: ? Weak. ? Dizzy. ? Very thirsty.  You have peed (urinated) only a small amount of very dark pee during 6-8 hours. Get help right away if:  You have   symptoms of very bad dehydration.  You cannot drink fluids without throwing up (vomiting).  Your symptoms get worse with treatment.  You have a fever.  You have a very bad headache.  You are throwing up or having watery poop (diarrhea) and it: ? Gets worse. ? Does not go away.  You have blood or something green (bile) in your throw-up.  You have blood in your poop (stool). This may cause poop to look black and tarry.  You have not peed in 6-8 hours.  You pass out (faint).  Your heart rate when you are sitting still is more than 100 beats a  minute.  You have trouble breathing. This information is not intended to replace advice given to you by your health care provider. Make sure you discuss any questions you have with your health care provider. Document Released: 03/03/2009 Document Revised: 11/25/2015 Document Reviewed: 07/01/2015 Elsevier Interactive Patient Education  2019 [employer]vier Inc.  

## 2018-09-03 ENCOUNTER — Telehealth: Payer: Self-pay | Admitting: Hematology

## 2018-09-03 DIAGNOSIS — C099 Malignant neoplasm of tonsil, unspecified: Secondary | ICD-10-CM | POA: Diagnosis not present

## 2018-09-03 DIAGNOSIS — E119 Type 2 diabetes mellitus without complications: Secondary | ICD-10-CM | POA: Diagnosis not present

## 2018-09-03 DIAGNOSIS — K5900 Constipation, unspecified: Secondary | ICD-10-CM | POA: Diagnosis not present

## 2018-09-03 NOTE — Telephone Encounter (Signed)
Appts added for lab & PF LMVM for patient with date/time per 4/14 staff message

## 2018-09-04 ENCOUNTER — Inpatient Hospital Stay: Payer: Commercial Managed Care - PPO

## 2018-09-04 ENCOUNTER — Other Ambulatory Visit: Payer: Self-pay | Admitting: Hematology

## 2018-09-04 ENCOUNTER — Other Ambulatory Visit: Payer: Self-pay

## 2018-09-04 VITALS — BP 112/70 | HR 97 | Temp 98.7°F | Resp 16

## 2018-09-04 DIAGNOSIS — C01 Malignant neoplasm of base of tongue: Secondary | ICD-10-CM | POA: Diagnosis not present

## 2018-09-04 DIAGNOSIS — Z931 Gastrostomy status: Secondary | ICD-10-CM | POA: Diagnosis not present

## 2018-09-04 DIAGNOSIS — C099 Malignant neoplasm of tonsil, unspecified: Secondary | ICD-10-CM

## 2018-09-04 DIAGNOSIS — Z95828 Presence of other vascular implants and grafts: Secondary | ICD-10-CM

## 2018-09-04 DIAGNOSIS — G4733 Obstructive sleep apnea (adult) (pediatric): Secondary | ICD-10-CM | POA: Diagnosis not present

## 2018-09-04 LAB — CMP (CANCER CENTER ONLY)
ALT: 23 U/L (ref 0–44)
AST: 15 U/L (ref 15–41)
Albumin: 3.5 g/dL (ref 3.5–5.0)
Alkaline Phosphatase: 86 U/L (ref 38–126)
Anion gap: 5 (ref 5–15)
BUN: 18 mg/dL (ref 6–20)
CO2: 31 mmol/L (ref 22–32)
Calcium: 9 mg/dL (ref 8.9–10.3)
Chloride: 96 mmol/L — ABNORMAL LOW (ref 98–111)
Creatinine: 0.7 mg/dL (ref 0.61–1.24)
GFR, Est AFR Am: >60 mL/min (ref >60)
GFR, Estimated: >60 mL/min (ref >60)
Glucose, Bld: 289 mg/dL — ABNORMAL HIGH (ref 70–99)
Potassium: 3.6 mmol/L (ref 3.5–5.1)
Sodium: 132 mmol/L — ABNORMAL LOW (ref 135–145)
Total Bilirubin: 0.3 mg/dL (ref 0.3–1.2)
Total Protein: 5.6 g/dL — ABNORMAL LOW (ref 6.5–8.1)

## 2018-09-04 LAB — CBC WITH DIFFERENTIAL (CANCER CENTER ONLY)
Abs Immature Granulocytes: 0.06 10*3/uL (ref 0.00–0.07)
Basophils Absolute: 0 10*3/uL (ref 0.0–0.1)
Basophils Relative: 0 %
Eosinophils Absolute: 0 10*3/uL (ref 0.0–0.5)
Eosinophils Relative: 0 %
HCT: 29.2 % — ABNORMAL LOW (ref 39.0–52.0)
Hemoglobin: 9.5 g/dL — ABNORMAL LOW (ref 13.0–17.0)
Immature Granulocytes: 3 %
Lymphocytes Relative: 8 %
Lymphs Abs: 0.2 10*3/uL — ABNORMAL LOW (ref 0.7–4.0)
MCH: 32.6 pg (ref 26.0–34.0)
MCHC: 32.5 g/dL (ref 30.0–36.0)
MCV: 100.3 fL — ABNORMAL HIGH (ref 80.0–100.0)
Monocytes Absolute: 0.5 10*3/uL (ref 0.1–1.0)
Monocytes Relative: 19 %
Neutro Abs: 1.6 10*3/uL — ABNORMAL LOW (ref 1.7–7.7)
Neutrophils Relative %: 70 %
Platelet Count: 277 10*3/uL (ref 150–400)
RBC: 2.91 MIL/uL — ABNORMAL LOW (ref 4.22–5.81)
RDW: 18.4 % — ABNORMAL HIGH (ref 11.5–15.5)
WBC Count: 2.4 10*3/uL — ABNORMAL LOW (ref 4.0–10.5)
nRBC: 3 % — ABNORMAL HIGH (ref 0.0–0.2)

## 2018-09-04 LAB — MAGNESIUM: Magnesium: 1.9 mg/dL (ref 1.7–2.4)

## 2018-09-04 MED ORDER — SODIUM CHLORIDE 0.9 % IV SOLN
Freq: Once | INTRAVENOUS | Status: AC
  Administered 2018-09-04: 11:00:00 via INTRAVENOUS
  Filled 2018-09-04: qty 250

## 2018-09-04 NOTE — Patient Instructions (Signed)

## 2018-09-05 ENCOUNTER — Ambulatory Visit: Payer: Commercial Managed Care - PPO | Admitting: Nutrition

## 2018-09-05 NOTE — Progress Notes (Signed)
RD working remotely.  Nutrition follow up completed with patient and wife on phone. Reports he is able to take medications with small sips of water but nothing else by mouth. Reports his mouth and throat continue to be sore and painful. It is difficult for him to do swallowing exercises. Reports dry mouth worsening over the last 2 weeks. Currently tolerating 7 bottles of Osmolite 1.5 with 30 mL promod 3 times daily. He is flushing tube with 60 mL free water before and after bolus. He is giving ~ 480 mL free water through his tube. Noted labs: glucose 289 and Na 132. Reports his BS drops to 60-80 overnight at some point. Last weight documented on 08/23/18 was 168.8 pounds. This is decreased from 173.7 pounds March 25.  Estimated Needs: 2200-2400 kcal, 100-125 grams protein, 2.4 L fluid  Nutrition Diagnosis: Inadequate oral intake continues.  Intervention: Educated patient to continue Osmolite 1.5, 7 bottles daily with 30 mL promod 3 times daily. Add one additional 240 mL free water flush to total 3 flushes of 240 mL daily. Recommended he call MD to ask about medications for BS and if they need adjusted. Educated patient on diet advancement as his throat and mouth heal.  TF, Promod and free water providing 2785 kcal, 134.3 grams protein, 2467 mL free water.  Monitoring, Evaluation, Goals: TF tolerance with gradual diet advancement to promote healing and adequate glycemic control.  Next Visit: Will touch base with patient as needed. He has my contact information.

## 2018-09-08 ENCOUNTER — Ambulatory Visit: Payer: Commercial Managed Care - PPO | Attending: Family Medicine

## 2018-09-08 DIAGNOSIS — R1310 Dysphagia, unspecified: Secondary | ICD-10-CM

## 2018-09-08 NOTE — Therapy (Signed)
[facility] [address] [place], [place], [address] Phone: [phone]   Fax:  [phone]  Speech Language Pathology Treatment  Patient Details  Name: Glenn Goodman MRN: [id number] Date of Birth: 21-Jun-1960 Referring Provider (SLP): Eppie Gibson, MD   Encounter Date: 09/08/2018  End of Session - 09/08/18 1310    Visit Number  3    Number of Visits  7    Date for SLP Re-Evaluation  12/16/18    SLP Start Time  1025    SLP Stop Time   1100    SLP Time Calculation (min)  35 min    Activity Tolerance  Patient tolerated treatment well       Past Medical History:  Diagnosis Date  . Arthritis   . Cancer ([place])   . OSA on CPAP   . Restless leg syndrome     Past Surgical History:  Procedure Laterality Date  . DIRECT LARYNGOSCOPY Left 04/22/2018   Dr. Janace Hoard  . FACIAL FRACTURE SURGERY Left 1986  . IR ANGIOGRAM PULMONARY BILATERAL SELECTIVE  08/22/2018  . IR ANGIOGRAM SELECTIVE EACH ADDITIONAL VESSEL  08/22/2018  . IR ANGIOGRAM SELECTIVE EACH ADDITIONAL VESSEL  08/22/2018  . IR GASTROSTOMY TUBE MOD SED  06/30/2018  . IR IMAGING GUIDED PORT INSERTION  06/30/2018  . IR INFUSION THROMBOL ARTERIAL INITIAL (MS)  08/22/2018  . IR INFUSION THROMBOL ARTERIAL INITIAL (MS)  08/22/2018  . IR THROMB F/U EVAL ART/VEN FINAL DAY (MS)  08/23/2018  . IR US GUIDE VASC ACCESS RIGHT  08/22/2018  . MODIFIED RADICAL NECK DISSECTION Left 05/26/2018   Dr. Nicolette Bang, [facility]  . transoral robotic surgery  05/26/2018   [facility], Dr. Nicolette Bang [facility]    There were no vitals filed for this visit.  Subjective Assessment - 09/08/18 1031    Subjective  Pt reports "a lot of pain" with swallowing. Endorses xerostomia, thick saliva.     Currently in Pain?  Yes    Pain Score  6     Pain Location  Throat    Pain Descriptors / Indicators  Sore    Pain Type  Acute pain    Pain Onset  1 to 4 weeks ago    Pain Frequency  Constant    Aggravating Factors   swallowing    Pain Relieving Factors  meds    Effect of Pain on Daily Activities  hurts to swallow            ADULT SLP TREATMENT - 09/08/18 1038      General Information   Behavior/Cognition  Alert;Cooperative;Pleasant mood      Treatment Provided   Treatment provided  Dysphagia      Dysphagia Treatment   Temperature Spikes Noted  No    Respiratory Status  Room air    Oral Cavity - Dentition  Adequate natural dentition    Treatment Methods  Patient/caregiver education;Compensation strategy training    Patient observed directly with PO's  No    Pharyngeal Phase Signs & Symptoms  --   burn&nasal drip with 3rd sip liquids lasts 5-10 sec.   Other treatment/comments  Pt reports drinking liquids with first few sips "feel ok" but subsequent swallows produce pain and burning and then nasal drainage begins for approx 5-10 seconds then stops. SLP assured pt that tissue in pharynx continues to heal and this is likely WNL. Encouraged pt to cont to perform HEP, to alternate between exercises to swallow (e.g., Mendelsohn, effortful, etc) and not  to swalow (e.g., pitch raise, chiin pushback, etc). SLP told pt to perform one rep of swallowing exercise and then 2-3 reps of non swallowing exercise, working his way down the HEP performing one rep of swallowing exercise between the 2-3 reps of  non-swallowing exercise in order to improve pt stamina for HEP completion throughout the day. SLP educated pt re: food journal. Pt told SLP rationale for HEP with mod cues from SLP.       Assessment / Recommendations / Plan   Plan  Continue with current plan of care      Progression Toward Goals   Progression toward goals  Progressing toward goals       SLP Education - 09/08/18 1309    Education Details  food journal, alternate non-swallow/swallow exercises, do one rep swallow with 2-3 reps of non-swallow in order to incr stamina for HEP, rationale for HEP    Person(s) Educated  Patient    Methods  Explanation     Comprehension  Verbalized understanding       SLP Short Term Goals - 09/08/18 1050      SLP SHORT TERM GOAL #1   Title  pt will complete HEP with rare min A over two sessions    Baseline  08-04-18    Time  1    Period  --   visits-for all STGs (visit 3)   Status  On-going      SLP SHORT TERM GOAL #2   Title  pt will tell SLP why he is completing HEP     Time  1    Period  --   session 3   Status  On-going   time period - 2 sessions     SLP SHORT TERM GOAL #3   Title  pt will tell SLP how a food journal can facilitate return to most normal diet    Time  --    Period  --    Status  Achieved       SLP Long Term Goals - 09/08/18 1303      SLP LONG TERM GOAL #1   Title  pt will complete HEP with modified independence x2 sessions    Baseline  07-06-18    Time  2    Period  --   visits- for all LTGs (visit #5)   Status  On-going      SLP LONG TERM GOAL #2   Title  pt will tell SLP 3 overt s/s of aspiration PNA with modified independence    Time  3   visit #5   Status  On-going       Plan - 09/08/18 1311    Clinical Impression Statement  Visit took place via telephone today, per medical director's directive (Dr. Eppie Gibson) due to COVID-19 pandemic. Pt with oropharyngeal swallowing described as difficult at the current time - pt ingesting very little PO except meds and small amounts liquid. See "other treatment/comments" for more details. Pt does not report overt s/s aspiration PNA today. SLP believes pt would benefit most from a video visit so SLP can see HEP performance, but as a last resort telephone would suffice if no other options exist. The probability of swallowing difficulty increases dramatically with the initiation of chemo and radiation therapy. Pt will need to be followed by SLP for regular assessment of accurate HEP completion as well as for safety with POs both during and following treatment/s.    Speech Therapy Frequency  --  once approx every four weeks    Duration  --   6 sessions (7 total)   Treatment/Interventions  Aspiration precaution training;Diet toleration management by SLP;Pharyngeal strengthening exercises;Internal/external aids;Trials of upgraded texture/liquids;Compensatory strategies;Patient/family education;SLP instruction and feedback;Cueing hierarchy;Environmental controls    Potential to Achieve Goals  Good    SLP Home Exercise Plan  provided today    Consulted and Agree with Plan of Care  Patient       Patient will benefit from skilled therapeutic intervention in order to improve the following deficits and impairments:   Dysphagia, unspecified type    Problem List Patient Active Problem List   Diagnosis Date Noted  . Acute on chronic respiratory failure with hypoxia ([place])   . Saddle embolus of pulmonary artery ([place])   . Acute pulmonary embolism without acute cor pulmonale ([facility])   . Deep vein thrombosis (DVT) of both lower extremities ([place])   . Leukopenia due to antineoplastic chemotherapy ([place])   . Antineoplastic chemotherapy induced anemia   . Dehydration   . LFT elevation   . Mucositis   . Pancytopenia ([place])   . Hypokalemia   . Hyperglycemia 08/21/2018  . Protein malnutrition ([place]) 07/30/2018  . AKI (acute kidney injury) ([place]) 07/23/2018  . Mucositis due to chemotherapy 07/23/2018  . Chemotherapy-induced nausea 07/17/2018  . Chemotherapy-induced diarrhea 07/17/2018  . Oral candidiasis 07/17/2018  . Port-A-Cath in place 07/09/2018  . Sensorineural hearing loss (SNHL) of both ears 06/18/2018  . Odynophagia 06/18/2018  . Malignant neoplasm of base of tongue ([place]) 05/09/2018  . 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

## 2018-09-09 ENCOUNTER — Ambulatory Visit: Payer: Self-pay

## 2018-09-11 ENCOUNTER — Ambulatory Visit: Payer: Self-pay

## 2018-09-11 ENCOUNTER — Inpatient Hospital Stay (HOSPITAL_BASED_OUTPATIENT_CLINIC_OR_DEPARTMENT_OTHER): Payer: Commercial Managed Care - PPO | Admitting: Hematology

## 2018-09-11 ENCOUNTER — Inpatient Hospital Stay: Payer: Commercial Managed Care - PPO

## 2018-09-11 ENCOUNTER — Encounter: Payer: Self-pay | Admitting: Hematology

## 2018-09-11 ENCOUNTER — Other Ambulatory Visit: Payer: Self-pay

## 2018-09-11 VITALS — BP 108/75 | HR 95 | Temp 97.9°F | Resp 17 | Ht 68.0 in | Wt 176.0 lb

## 2018-09-11 DIAGNOSIS — H9319 Tinnitus, unspecified ear: Secondary | ICD-10-CM

## 2018-09-11 DIAGNOSIS — Z79899 Other long term (current) drug therapy: Secondary | ICD-10-CM

## 2018-09-11 DIAGNOSIS — C099 Malignant neoplasm of tonsil, unspecified: Secondary | ICD-10-CM

## 2018-09-11 DIAGNOSIS — G893 Neoplasm related pain (acute) (chronic): Secondary | ICD-10-CM

## 2018-09-11 DIAGNOSIS — N179 Acute kidney failure, unspecified: Secondary | ICD-10-CM | POA: Diagnosis not present

## 2018-09-11 DIAGNOSIS — T451X5A Adverse effect of antineoplastic and immunosuppressive drugs, initial encounter: Secondary | ICD-10-CM

## 2018-09-11 DIAGNOSIS — E46 Unspecified protein-calorie malnutrition: Secondary | ICD-10-CM

## 2018-09-11 DIAGNOSIS — Z7901 Long term (current) use of anticoagulants: Secondary | ICD-10-CM

## 2018-09-11 DIAGNOSIS — C01 Malignant neoplasm of base of tongue: Secondary | ICD-10-CM | POA: Diagnosis not present

## 2018-09-11 DIAGNOSIS — T451X5S Adverse effect of antineoplastic and immunosuppressive drugs, sequela: Secondary | ICD-10-CM

## 2018-09-11 DIAGNOSIS — I82432 Acute embolism and thrombosis of left popliteal vein: Secondary | ICD-10-CM

## 2018-09-11 DIAGNOSIS — K1231 Oral mucositis (ulcerative) due to antineoplastic therapy: Secondary | ICD-10-CM

## 2018-09-11 DIAGNOSIS — B3749 Other urogenital candidiasis: Secondary | ICD-10-CM

## 2018-09-11 DIAGNOSIS — Z85828 Personal history of other malignant neoplasm of skin: Secondary | ICD-10-CM

## 2018-09-11 DIAGNOSIS — I2699 Other pulmonary embolism without acute cor pulmonale: Secondary | ICD-10-CM

## 2018-09-11 DIAGNOSIS — E860 Dehydration: Secondary | ICD-10-CM

## 2018-09-11 DIAGNOSIS — I82403 Acute embolism and thrombosis of unspecified deep veins of lower extremity, bilateral: Secondary | ICD-10-CM

## 2018-09-11 DIAGNOSIS — Z794 Long term (current) use of insulin: Secondary | ICD-10-CM

## 2018-09-11 DIAGNOSIS — K5900 Constipation, unspecified: Secondary | ICD-10-CM

## 2018-09-11 DIAGNOSIS — E119 Type 2 diabetes mellitus without complications: Secondary | ICD-10-CM

## 2018-09-11 DIAGNOSIS — R439 Unspecified disturbances of smell and taste: Secondary | ICD-10-CM

## 2018-09-11 DIAGNOSIS — Z931 Gastrostomy status: Secondary | ICD-10-CM | POA: Diagnosis not present

## 2018-09-11 DIAGNOSIS — Z95828 Presence of other vascular implants and grafts: Secondary | ICD-10-CM

## 2018-09-11 DIAGNOSIS — R110 Nausea: Secondary | ICD-10-CM

## 2018-09-11 DIAGNOSIS — R739 Hyperglycemia, unspecified: Secondary | ICD-10-CM

## 2018-09-11 DIAGNOSIS — D6481 Anemia due to antineoplastic chemotherapy: Secondary | ICD-10-CM

## 2018-09-11 DIAGNOSIS — D701 Agranulocytosis secondary to cancer chemotherapy: Secondary | ICD-10-CM

## 2018-09-11 LAB — CBC WITH DIFFERENTIAL (CANCER CENTER ONLY)
Abs Immature Granulocytes: 0.08 10*3/uL — ABNORMAL HIGH (ref 0.00–0.07)
Basophils Absolute: 0 10*3/uL (ref 0.0–0.1)
Basophils Relative: 1 %
Eosinophils Absolute: 0 10*3/uL (ref 0.0–0.5)
Eosinophils Relative: 1 %
HCT: 32.1 % — ABNORMAL LOW (ref 39.0–52.0)
Hemoglobin: 10.2 g/dL — ABNORMAL LOW (ref 13.0–17.0)
Immature Granulocytes: 2 %
Lymphocytes Relative: 8 %
Lymphs Abs: 0.3 10*3/uL — ABNORMAL LOW (ref 0.7–4.0)
MCH: 33.3 pg (ref 26.0–34.0)
MCHC: 31.8 g/dL (ref 30.0–36.0)
MCV: 104.9 fL — ABNORMAL HIGH (ref 80.0–100.0)
Monocytes Absolute: 0.7 10*3/uL (ref 0.1–1.0)
Monocytes Relative: 19 %
Neutro Abs: 2.5 10*3/uL (ref 1.7–7.7)
Neutrophils Relative %: 69 %
Platelet Count: 323 10*3/uL (ref 150–400)
RBC: 3.06 MIL/uL — ABNORMAL LOW (ref 4.22–5.81)
RDW: 21.1 % — ABNORMAL HIGH (ref 11.5–15.5)
WBC Count: 3.6 10*3/uL — ABNORMAL LOW (ref 4.0–10.5)
nRBC: 3.6 % — ABNORMAL HIGH (ref 0.0–0.2)

## 2018-09-11 LAB — FOLATE: Folate: 37.8 ng/mL (ref >5.9)

## 2018-09-11 LAB — VITAMIN B12: Vitamin B-12: 534 pg/mL (ref 180–914)

## 2018-09-11 LAB — CMP (CANCER CENTER ONLY)
ALT: 20 U/L (ref 0–44)
AST: 16 U/L (ref 15–41)
Albumin: 3.8 g/dL (ref 3.5–5.0)
Alkaline Phosphatase: 89 U/L (ref 38–126)
Anion gap: 8 (ref 5–15)
BUN: 21 mg/dL — ABNORMAL HIGH (ref 6–20)
CO2: 31 mmol/L (ref 22–32)
Calcium: 8.9 mg/dL (ref 8.9–10.3)
Chloride: 99 mmol/L (ref 98–111)
Creatinine: 0.68 mg/dL (ref 0.61–1.24)
GFR, Est AFR Am: >60 mL/min (ref >60)
GFR, Estimated: >60 mL/min (ref >60)
Glucose, Bld: 187 mg/dL — ABNORMAL HIGH (ref 70–99)
Potassium: 3.6 mmol/L (ref 3.5–5.1)
Sodium: 138 mmol/L (ref 135–145)
Total Bilirubin: 0.3 mg/dL (ref 0.3–1.2)
Total Protein: 5.7 g/dL — ABNORMAL LOW (ref 6.5–8.1)

## 2018-09-11 LAB — MAGNESIUM: Magnesium: 1.8 mg/dL (ref 1.7–2.4)

## 2018-09-11 LAB — T4, FREE: Free T4: 0.92 ng/dL (ref 0.82–1.77)

## 2018-09-11 MED ORDER — HEPARIN SOD (PORK) LOCK FLUSH 100 UNIT/ML IV SOLN
500.0000 [IU] | Freq: Once | INTRAVENOUS | Status: AC
  Filled 2018-09-11: qty 5

## 2018-09-11 MED ORDER — SODIUM CHLORIDE 0.9% FLUSH
10.0000 mL | INTRAVENOUS | Status: AC | PRN
  Filled 2018-09-11: qty 10

## 2018-09-11 NOTE — Addendum Note (Signed)
Addended by: [place] on: 09/11/2018 02:37 PM   Modules accepted: Orders, SmartSet

## 2018-09-11 NOTE — Progress Notes (Signed)
[facility] OFFICE PROGRESS NOTE  Patient Care Team: [name], MD as PCP - General (Family Medicine) Melissa Montane, MD as Consulting Physician (Otolaryngology) Eppie Gibson, MD as Attending Physician (Radiation Oncology) Tish Men, MD as Consulting Physician (Hematology) Leota Sauers, RN as Oncology Nurse Navigator Schinke, Perry Mount, Tye as Speech Language Pathologist (Speech Pathology) Karie Mainland, RD as Dietitian (Nutrition) Wynelle Beckmann, Melodie Bouillon, PT as Physical Therapist (Physical Therapy) Kennith Center, LCSW as Social Worker Francina Ames, MD as Referring Physician (Otolaryngology)  HEME/ONC OVERVIEW: 1. Stage I (pT1pN1M0) left tonsil SCCa, p16+ -02/2018: CT neck showed enlarged left Level II and II LN with central necrosis (largest 2.4cm); effacement of the left IJ vein noted  -03/2018: US-guided bx of left cervical LN; path showed SCCa, p16+ -04/2018: PET showed FDG-avid left palatine tonsil lesion measuring ~1.3cm with associated FDG-avid left Level IIa LN's; no evidence of metastatic disease -05/2018: TORS by Dr. Raphael Gibney at [facility]; path showed HPV-positive SCCa of the left glossotonsillar sulcus, 1.6cm, focal margin positive, PNI+, 1/6 LN's positive (largest 6.8cm) with ECE; pT1pN1  -06/2018 - 08/2018: adjuvant chemoradiation with weekly cisplatin  2. Bilateral PTE and LLE DVT -08/2018: admitted for syncope; CTA showed bilateral PTE w/ RV strain s/p tPA; doppler positive for acute DVT involving left popliteal, posterior tibial and peroneal veins  -08/2018 - present: Eliquis   3. Port and PEG placement in 06/2018   TREATMENT SUMMARY:  05/26/2018: TORS by Dr. Nicolette Bang at [facility]   07/07/2018 - 08/21/2018: adjuvant chemoradiation with weekly cisplatin 36m/m2 x 6 doses  08/27/2018 - present: Eliquis 575mBID   ASSESSMENT & PLAN:   Stage I (cT1N1M0) left tonsil SCCa, p16+ -S/p adjuvant chemoradiation in early 08/2018 -Post-treatment  complicated by DKA as well as bilateral PTE and LLE DVT -I have ordered end-of-treatment CT neck and chest for end of 09/2018  -Patient [facility]-I counseled the patient on the importance of jaw exercises as well as range of motion exercises in the neck and shoulders to maintain mobility  Bilateral PTE and LLE DVT -Unprovoked; s/p TPA in 08/2018 due to RV strain -Patient is currently on Eliquis without any significant side effects -Continue Eliquis; goal of anticoagulation is lifelong  Port-a-cath in place -q6-8week flush for now -If scans NED, we can remove the port  Chemotherapy-associated anemia -Secondary to chemotherapy -Hgb 10.2, improving -Patient denies any symptom of bleeding -We will monitor for now  Chemotherapy-associated leukopenia -Secondary to chemotherapy -WBC 3.6k with ANC 2500, improving -Patient denies any symptoms of infection -We will monitor for now  Chemotherapy-associated mucositis -Secondary to chemotherapy -Currently on IR morphine tab 1572m4hrs PRN -Pain overall improving -Continue PRN morphine and salt/soda rinses   Protein malnutrition -Secondary to chemotherapy vs. chemoradiation -Weight improving since the last visit  -He is currently taking 7 cans of Osmolite/day -I encouraged the patient to begin oral intake as tolerated, and once he is able to maintain adequate nutrition and hydration by mouth, we can remove the feeding tube   DM II -Overall improving; on Lantus w/ SSI Humalog -I encourage the patient to continue follow-up with PCP for further management  Orders Placed This Encounter  Procedures  . CT SOFT TISSUE NECK W CONTRAST    Standing Status:   Future    Standing Expiration Date:   09/11/2019    Order Specific Question:   If indicated for the ordered procedure, I authorize  the administration of contrast media per Radiology protocol    Answer:   Yes    Order Specific Question:    Preferred imaging location?    Answer:   [facility] Specific Question:   Radiology Contrast Protocol - do NOT remove file path    Answer:   \\[email or web address]  . CT CHEST W CONTRAST    Standing Status:   Future    Standing Expiration Date:   09/11/2019    Order Specific Question:   If indicated for the ordered procedure, I authorize the administration of contrast media per Radiology protocol    Answer:   Yes    Order Specific Question:   Preferred imaging location?    Answer:   [facility] Specific Question:   Radiology Contrast Protocol - do NOT remove file path    Answer:   \\[email or web address]   All questions were answered. The patient knows to call the clinic with any problems, questions or concerns. No barriers to learning was detected.  A total of more than 25 minutes were spent face-to-face with the patient during this encounter and over half of that time was spent on counseling and coordination of care as outlined above.   Return in end of 09/2018 for labs, port flush, imaging results and clinic appt.   Tish Men, MD 09/11/2018 2:33 PM  CHIEF COMPLAINT: "I am doing a little better"  INTERVAL HISTORY: Glenn Goodman returns to clinic for follow-up of squamous of carcinoma of the left tonsil s/p adjuvant chemoradiation.  Patient reports that over the past few weeks, his swallowing function has slowly improved, and he has less pain with swallowing.  He has only had to use IR morphine once or twice per day.  He is currently drinking water with his medicines, and the bad taste of water has resolved.  He has not yet started eating by mouth yet, but is hoping to begin eating some soft food this weekend.  He still has slight tightness in the left side of his throat, for which she has been doing swallowing exercises per speech pathology.  He is currently administering 7 cans of Osmolite per day as well as water  and Gatorade (~100 oz of fluid/day).  He is saliva is becoming less thick.  He denies any abnormal bleeding or bruising from Eliquis except occasional scant blood around the feeding tube site.  SUMMARY OF ONCOLOGIC HISTORY:   Squamous cell carcinoma of left tonsil ([place])   03/20/2018 Procedure    FNA of the left cervical LN in office    03/20/2018 Pathology Results    Non-diagnostic    04/07/2018 Procedure    US-guided left cervical LN biopsy/FNA     04/07/2018 Pathology Results    (Accession: [id number]) Lymph node, needle/core biopsy, left cervical - METASTATIC SQUAMOUS CELL CARCINOMA INVOLVING A LYMPH NODE (1/1) - SEE COMMENT  p16 IHC positive. EBV ISH negative.     04/22/2018 Pathology Results    (Accession: [id number]) Tonsil, biopsy, left, base of tongue - INVASIVE KERATINIZING SQUAMOUS CELL CARCINOMA.    04/29/2018 Imaging    PET:  IMPRESSION: 1. Left palatine tonsil/adjacent pharyngeal mucosal space lesion is asymmetric, measures about 1.3 cm in long axis, and has a maximum SUV of 8.2, compatible with malignancy. There is associated hypermetabolic left level IIa adenopathy as shown on prior exams. 2. Symmetric accentuated activity in the lymphoid tissue at the tongue base, maximum SUV 5.3, quite likely  physiologic given the symmetry.  3. No findings of metastatic disease Pred to the chest, abdomen/pelvis, or skeleton.    05/26/2018 Surgery    Radical tonsillectomy    05/26/2018 Pathology Results    PROCEDURE: Radical tonsillectomy TUMOR SITE: Glossotonsillar sulcus TUMOR LATERALITY: LEFT TUMOR FOCALITY: Unifocal TUMOR SIZE: GREATEST DIMENSION: 1.6 cm HISTOLOGIC TYPE: HPV-mediated squamous cell carcinoma MARGINS: Involved by invasive tumor (focal) Specify location of closest margin: Tongue base (soft tissue) Note: Although an additional tongue base mucosal margin was submitted, location of tumor extension to margin appears 1 cm from the mucosal  surface. LYMPHOVASCULAR INVASION: Not identified PERINEURAL INVASION: Present REGIONAL LYMPH NODES:  NUMBER OF LYMPH NODES INVOLVED: 1  NUMBER OF LYMPH NODES EXAMINED: 6  LATERALITY OF LYMPH NODES INVOLVED: Ipsilateral  SIZE OF LARGEST METASTATIC DEPOSIT: 6.8 cm  EXTRANODAL EXTENSION: Present   Distance from lymph node capsule: 6 millimeters PATHOLOGIC STAGE CLASSIFICATION (pTNM, AJCC 8TH Ed): pT1 pN1 (HPV-mediated oropharynx tumor)  pT1: Tumor 2 cm or smaller in greatest dimension  pN1: Metastasis in 4 or fewer lymph nodes ANCILLARY STUDIES:  p16 expression by immunohistochemistry: Positive  HPV by PCR: pending    05/26/2018 Cancer Staging    Staging form: Pharynx - HPV-Mediated Oropharynx, AJCC 8th Edition - Pathologic stage from 05/26/2018: Stage I (pT1, pN1, cM0, p16+) - Signed by Tish Men, MD on 06/18/2018    07/10/2018 - 07/10/2018 Chemotherapy    The patient had palonosetron (ALOXI) injection 0.25 mg, 0.25 mg, Intravenous,  Once, 0 of 3 cycles CISplatin (PLATINOL) 198 mg in sodium chloride 0.9 % 500 mL chemo infusion, 100 mg/m2 = 198 mg, Intravenous,  Once, 0 of 3 cycles fosaprepitant (EMEND) 150 mg, dexamethasone (DECADRON) 12 mg in sodium chloride 0.9 % 145 mL IVPB, , Intravenous,  Once, 0 of 3 cycles  for chemotherapy treatment.     07/10/2018 -  Chemotherapy    The patient had palonosetron (ALOXI) injection 0.25 mg, 0.25 mg, Intravenous,  Once, 6 of 6 cycles Administration: 0.25 mg (07/10/2018), 0.25 mg (07/18/2018), 0.25 mg (07/24/2018), 0.25 mg (07/31/2018), 0.25 mg (08/07/2018), 0.25 mg (08/14/2018) CISplatin (PLATINOL) 79 mg in sodium chloride 0.9 % 250 mL chemo infusion, 40 mg/m2 = 79 mg, Intravenous,  Once, 6 of 6 cycles Administration: 79 mg (07/10/2018), 79 mg (07/18/2018), 79 mg (07/24/2018), 79 mg (07/31/2018), 79 mg (08/07/2018), 79 mg (08/14/2018) fosaprepitant (EMEND) 150 mg, dexamethasone (DECADRON) 12 mg in sodium chloride 0.9 % 145 mL IVPB, , Intravenous,   Once, 6 of 6 cycles Administration:  (07/10/2018),  (07/18/2018),  (07/24/2018),  (07/31/2018),  (08/07/2018),  (08/14/2018)  for chemotherapy treatment.      Malignant neoplasm of base of tongue ([place])   05/09/2018 Initial Diagnosis    Malignant neoplasm of base of tongue ([place])    05/09/2018 Cancer Staging    Staging form: Pharynx - HPV-Mediated Oropharynx, AJCC 8th Edition - Clinical: Stage I (cT1, cN1, cM0, p16+) - Signed by Eppie Gibson, MD on 05/09/2018    06/11/2018 Cancer Staging    Staging form: Pharynx - HPV-Mediated Oropharynx, AJCC 8th Edition - Pathologic: Stage I (pT1, pN1, cM0, p16+) - Signed by Eppie Gibson, MD on 06/11/2018     REVIEW OF SYSTEMS:   Constitutional: ( - ) fevers, ( - )  chills , ( - ) night sweats Eyes: ( - ) blurriness of vision, ( - ) double vision, ( - ) watery eyes Ears, nose, mouth, throat, and face: ( + ) mucositis, ( - ) sore throat Respiratory: ( - )  cough, ( - ) dyspnea, ( - ) wheezes Cardiovascular: ( - ) palpitation, ( - ) chest discomfort, ( - ) lower extremity swelling Gastrointestinal:  ( - ) nausea, ( - ) heartburn, ( - ) change in bowel habits Skin: ( - ) abnormal skin rashes Lymphatics: ( - ) new lymphadenopathy, ( - ) easy bruising Neurological: ( - ) numbness, ( - ) tingling, ( - ) new weaknesses Behavioral/Psych: ( - ) mood change, ( - ) new changes  All other systems were reviewed with the patient and are negative.  I have reviewed the past medical history, past surgical history, social history and family history with the patient and they are unchanged from previous note.  ALLERGIES:  has No Known Allergies.  MEDICATIONS:  Current Outpatient Medications  Medication Sig Dispense Refill  . apixaban (ELIQUIS) 5 MG TABS tablet 38m po bid for 7 days then 559mpo bid , for 30days supply. 60 tablet 0  . bisacodyl (DULCOLAX) 5 MG EC tablet Take 5 mg by mouth 2 (two) times daily.    . blood glucose meter kit and supplies KIT Dispense based  on patient and insurance preference. Use up to four times daily as directed. (FOR ICD-9 250.00, 250.01). 1 each 0  . insulin glargine (LANTUS) 100 unit/mL SOPN Inject 0.18 mLs (18 Units total) into the skin at bedtime for 30 days. 15 mL 0  . insulin lispro (HUMALOG KWIKPEN) 100 UNIT/ML KwikPen Before each meal 3 times a day 140-199 - 2 units; 200-250  4units; 251-299  6 units;  300-349 - 8 units; 350 or above 10 units. 15 mL 0  . Insulin Pen Needle (PEN NEEDLES 3/16") 31G X 5 MM MISC For insulin injection. 100 each 0  . lidocaine (XYLOCAINE) 2 % solution Patient: Mix 1part 2% viscous lidocaine, 1part H20. Swish & swallow 1034mf diluted mixture, 38m96mefore meals and at bedtime, up to QID (Patient taking differently: Use as directed 10 mLs in the mouth or throat 4 (four) times daily as needed for mouth pain. Mix 1 part 2% viscous lidocaine, 1 part H20. Swish & swallow 10 mL of diluted mixture, 30 min before meals and at bedtime, up to QID) 100 mL 5  . LORazepam (ATIVAN) 0.5 MG tablet Take 1 tablet (0.5 mg total) by mouth every 6 (six) hours as needed (Nausea or vomiting). 30 tablet 0  . metFORMIN (GLUCOPHAGE) 500 MG tablet Take 1 tablet (500 mg total) by mouth 2 (two) times daily with a meal. 60 tablet 0  . morphine (MSIR) 15 MG tablet Take 1 tablet (15 mg total) by mouth every 4 (four) hours as needed for severe pain. 60 tablet 0  . Neomycin-Bacitracin-Polymyxin (NEOSPORIN EX) Apply 1 application topically daily as needed (neck wound).    . Nutritional Supplements (FEEDING SUPPLEMENT, OSMOLITE 1.5 CAL,) LIQD Give 1.5 bottles Osmolite 1.5 Bid via PEG and 2 bottles Osmolite 1.5 via PEG for total of 7 bottles daily.  Give 60 cc free water before and after bolus feedings 4 times daily.  In addition consume or give via PEG 240 mL free water 3 times daily.  Please send formula and split gauze to patient.  He does not need other supplies at this time. (Patient taking differently: Take 355.5-474 mLs by mouth See  admin instructions. Take 474 ml in the morning, 474 ml at lunch, 355.5 ml at dinner and 355.5 ml at bedtime via PEG for total of 7 bottles daily. Give 60 cc  free water before and after bolus feedings 4 times daily.  In addition consume or give via PEG 240 mL free water 3 times daily.  Please send formula and split gauze to patient) 7 Bottle 0  . Nutritional Supplements (PROMOD) LIQD Add 30 mL Promod TID. Continue 7 bottles Osmolite 1.5 daily. (Patient taking differently: Take 30 mLs by mouth 3 (three) times daily. ) 2 Bottle 1  . ondansetron (ZOFRAN) 8 MG tablet Take 1 tablet (8 mg total) by mouth 2 (two) times daily as needed. Start on the third day after chemotherapy. (Patient taking differently: Take 8 mg by mouth 2 (two) times daily as needed for nausea or vomiting. Start on the third day after chemotherapy.) 30 tablet 1  . pramipexole (MIRAPEX) 1 MG tablet Take 1 mg by mouth at bedtime.    . silver sulfADIAZINE (SILVADENE) 1 % cream Apply 1 application topically 2 (two) times daily.    . sodium fluoride (PREVIDENT 5000 PLUS) 1.1 % CREA dental cream Place 1 application onto teeth at bedtime. Apply to tooth brush. Brush teeth for 2 minutes. Spit out excess. DO NOT rinse afterwards. Repeat nightly.    . Wound Dressings (SONAFINE EX) Apply 1 application topically daily as needed (neck wound).     No current facility-administered medications for this visit.    Facility-Administered Medications Ordered in Other Visits  Medication Dose Route Frequency Provider Last Rate Last Dose  . heparin lock flush 100 unit/mL  500 Units Intravenous Once Ennever, Rudell Cobb, MD      . sodium chloride flush (NS) 0.9 % injection 10 mL  10 mL Intravenous PRN Ennever, Rudell Cobb, MD        PHYSICAL EXAMINATION: ECOG PERFORMANCE STATUS: 1 - Symptomatic but completely ambulatory  Today's Vitals   09/11/18 1300  BP: 108/75  Pulse: 95  Resp: 17  Temp: 97.9 F (36.6 C)  SpO2: 98%  Weight: 176 lb (79.8 kg)  Height: 5'  8" (1.727 m)  PainSc: 3    Body mass index is 26.76 kg/m.  Filed Weights   09/11/18 1300  Weight: 176 lb (79.8 kg)    GENERAL: alert, no distress and comfortable  SKIN: skin color, texture, turgor are normal, no rashes or significant lesions EYES: conjunctiva are pink and non-injected, sclera clear OROPHARYNX: no significant mucositis or candidiasis  NECK: firm neck LYMPH:  no palpable lymphadenopathy in the cervical LUNGS: clear to auscultation with normal breathing effort HEART: regular rate & rhythm and no murmurs and no lower extremity edema ABDOMEN: soft, non-tender, non-distended, normal bowel sounds Musculoskeletal: no cyanosis of digits and no clubbing  PSYCH: alert & oriented x 3, fluent speech NEURO: no focal motor/sensory deficits  LABORATORY DATA:  I have reviewed the data as listed    Component Value Date/Time   NA 138 09/11/2018 1316   K 3.6 09/11/2018 1316   CL 99 09/11/2018 1316   CO2 31 09/11/2018 1316   GLUCOSE 187 (H) 09/11/2018 1316   BUN 21 (H) 09/11/2018 1316   CREATININE 0.68 09/11/2018 1316   CALCIUM 8.9 09/11/2018 1316   PROT 5.7 (L) 09/11/2018 1316   ALBUMIN 3.8 09/11/2018 1316   AST 16 09/11/2018 1316   ALT 20 09/11/2018 1316   ALKPHOS 89 09/11/2018 1316   BILITOT 0.3 09/11/2018 1316   GFRNONAA >60 09/11/2018 1316   GFRAA >60 09/11/2018 1316    No results found for: SPEP, UPEP  Lab Results  Component Value Date   WBC 3.6 (L)  09/11/2018   NEUTROABS 2.5 09/11/2018   HGB 10.2 (L) 09/11/2018   HCT 32.1 (L) 09/11/2018   MCV 104.9 (H) 09/11/2018   PLT 323 09/11/2018      Chemistry      Component Value Date/Time   NA 138 09/11/2018 1316   K 3.6 09/11/2018 1316   CL 99 09/11/2018 1316   CO2 31 09/11/2018 1316   BUN 21 (H) 09/11/2018 1316   CREATININE 0.68 09/11/2018 1316      Component Value Date/Time   CALCIUM 8.9 09/11/2018 1316   ALKPHOS 89 09/11/2018 1316   AST 16 09/11/2018 1316   ALT 20 09/11/2018 1316   BILITOT 0.3  09/11/2018 1316       RADIOGRAPHIC STUDIES: I have personally reviewed the radiological images as listed below and agreed with the findings in the report. Ct Angio Chest Pe W Or Wo Contrast  Addendum Date: 08/22/2018   ADDENDUM REPORT: 08/22/2018 15:55 ADDENDUM: Critical Value/emergent results were called by telephone at the time of interpretation on 08/22/2018 at 3:52 p.m. to Dr. Florencia Reasons , who verbally acknowledged these results. Electronically Signed   By: Marin Olp M.D.   On: 08/22/2018 15:55   Result Date: 08/22/2018 CLINICAL DATA:  Patient received treatment for head neck cancer. Hyperglycemic. Shortness of breath. Assess for pulmonary embolism. EXAM: CT ANGIOGRAPHY CHEST WITH CONTRAST TECHNIQUE: Multidetector CT imaging of the chest was performed using the standard protocol during bolus administration of intravenous contrast. Multiplanar CT image reconstructions and MIPs were obtained to evaluate the vascular anatomy. CONTRAST:  70 mL OMNIPAQUE IOHEXOL 350 MG/ML SOLN COMPARISON:  Chest CT 07/07/2010 and PET-CT 04/29/2018 FINDINGS: Cardiovascular: Right IJ Port-A-Cath with tip over the cavoatrial junction. Heart is normal size. Thoracic aorta is within normal. Pulmonary arteries are well opacified and demonstrate moderate burden of emboli over the bilateral main pulmonary arteries with small saddle embolus component and emboli extending into the proximal bilateral lobar arteries. Evidence of right heart strain with RV/LV ratio of 52.7/25.8 = 2.0. Remaining vascular structures are unremarkable. Mediastinum/Nodes: No evidence of mediastinal or hilar adenopathy. Remaining mediastinal structures are normal. Lungs/Pleura: Lungs are adequately inflated. There is a small right pleural effusion with minimal associated basilar atelectasis. Airways are normal. Upper Abdomen: No acute findings. Musculoskeletal: No acute findings. Review of the MIP images confirms the above findings. IMPRESSION: Moderate burden  bilateral pulmonary emboli within the main pulmonary arteries with small saddle embolus component as emboli extends into the proximal bilateral lobar arteries. Right heart strain with LV ratio of 2.0. Positive for acute PE with CT evidence of right heart strain (RV/LV Ratio = 2.0) consistent with at least submassive (intermediate risk) PE. The presence of right heart strain has been associated with an increased risk of morbidity and mortality. Please activate Code PE by paging [phone]. Small right pleural effusion with minimal associated right basilar atelectasis. Electronically Signed: By: Marin Olp M.D. On: 08/22/2018 15:41   Ir Angiogram Pulmonary Bilateral Selective  Result Date: 08/23/2018 INDICATION: History of head neck cancer, now with bilateral sub massive pulmonary embolism. Request made for placement of bilateral infusion catheters for the initiation pulmonary arterial thrombolysis EXAM: 1. ULTRASOUND GUIDANCE FOR VENOUS ACCESS X2 2. PULMONARY ARTERIOGRAPHY 3. FLUOROSCOPIC GUIDED PLACEMENT OF BILATERAL PULMONARY ARTERIAL LYTIC INFUSION CATHETERS COMPARISON:  Chest CTA-08/22/2018 MEDICATIONS: None ANESTHESIA/SEDATION: Moderate (conscious) sedation was employed during this procedure. A total of Versed 1 mg and Fentanyl 50 mcg was administered intravenously. Moderate Sedation Time: 40 minutes. The patient's level of  consciousness and vital signs were monitored continuously by radiology nursing throughout the procedure under my direct supervision. CONTRAST:  10 mL OMNIPAQUE IOHEXOL 300 MG/ML  SOLN FLUOROSCOPY TIME:  3 minutes, 36 seconds (24 mGy) COMPLICATIONS: None immediate. TECHNIQUE: Informed written consent was obtained from the patient after a discussion of the risks, benefits and alternatives to treatment. Questions regarding the procedure were encouraged and answered. A timeout was performed prior to the initiation of the procedure. Ultrasound scanning was performed of the right groin and  demonstrated wide patency of the right common femoral vein. As such, the right common femoral vein was selected venous access. The right groin was prepped and draped in the usual sterile fashion, and a sterile drape was applied covering the operative field. Maximum barrier sterile technique with sterile gowns and gloves were used for the procedure. A timeout was performed prior to the initiation of the procedure. Local anesthesia was provided with 1% lidocaine. Under direct ultrasound guidance, the right common femoral vein was accessed with a micro puncture kit ultimately allowing placement of a 6 French, 35 cm vascular sheath. Slightly caudal to this initial access, the right common femoral was again accessed with an additional micropuncture kit ultimately allowing placement of an additional 6 French, 35 cm vascular sheath. Ultrasound images were saved for procedural documentation purposes. With the use of a glidewire, a vertebral catheter was advanced into the main pulmonary artery and a limited central pulmonary arteriogram was performed. Pressure measurements were attempted to be obtained from the level the main pulmonary artery however this ultimately proved unsuccessful secondary to measurement device malfunction. The vertebral catheter was advanced into the distal branch of the left lower lobe pulmonary artery. Limited contrast injection confirmed appropriate positioning. Over an exchange length Rosen wire, the vertebral catheter was exchanged for a 105/12 cm multi side-hole EKOS ultrasound assisted infusion catheter. With the use of a glidewire, a vertebral catheter was advanced into a distal branch of the right lower lobe pulmonary artery. Limited contrast injection confirmed appropriate positioning. Over an exchange length Rosen wire, the pigtail catheter was exchanged for a 105/18 cm multi side-hole EKOS ultrasound assisted infusion catheter. A postprocedural fluoroscopic image was obtained to document  final catheter positioning. The external catheter tubing was secured at the right thigh and the lytic therapy was initiated. The patient tolerated the procedure well without immediate postprocedural complication. FINDINGS: Limited central pulmonary arteriogram was performed to document appropriate catheter positioning. As above, pressure measurements unable to be obtained secondary to pressure measurement device malfunction. Following the procedure, both ultrasound assisted infusion catheter tips terminate within the distal aspects of the bilateral lower lobe sub segmental pulmonary arteries. IMPRESSION: Successful fluoroscopic guided initiation of bilateral ultrasound assisted catheter directed pulmonary arterial lysis for sub massive pulmonary embolism and right-sided heart strain. PLAN: - Will proceed with 12 hour bilateral pulmonary arterial catheter directed thrombolysis. - Will discussed appropriateness of placement of an IVC filter in the setting of left lower extremity DVT with providing ICU attending prior to removal of infusion catheters. Electronically Signed   By: Sandi Mariscal M.D.   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 Request made for placement of bilateral infusion catheters for the initiation pulmonary arterial thrombolysis EXAM: 1. ULTRASOUND GUIDANCE FOR VENOUS ACCESS X2 2. PULMONARY ARTERIOGRAPHY 3. FLUOROSCOPIC GUIDED PLACEMENT OF BILATERAL PULMONARY ARTERIAL LYTIC INFUSION CATHETERS COMPARISON:  Chest CTA-08/22/2018 MEDICATIONS:  None ANESTHESIA/SEDATION: Moderate (conscious) sedation was employed during this procedure. A total of Versed 1 mg and Fentanyl 50 mcg was administered intravenously. Moderate Sedation Time: 40 minutes. The patient's level of consciousness and vital signs were monitored continuously by radiology nursing throughout the  procedure under my direct supervision. CONTRAST:  10 mL OMNIPAQUE IOHEXOL 300 MG/ML  SOLN FLUOROSCOPY TIME:  3 minutes, 36 seconds (24 mGy) COMPLICATIONS: None immediate. TECHNIQUE: Informed written consent was obtained from the patient after a discussion of the risks, benefits and alternatives to treatment. Questions regarding the procedure were encouraged and answered. A timeout was performed prior to the initiation of the procedure. Ultrasound scanning was performed of the right groin and demonstrated wide patency of the right common femoral vein. As such, the right common femoral vein was selected venous access. The right groin was prepped and draped in the usual sterile fashion, and a sterile drape was applied covering the operative field. Maximum barrier sterile technique with sterile gowns and gloves were used for the procedure. A timeout was performed prior to the initiation of the procedure. Local anesthesia was provided with 1% lidocaine. Under direct ultrasound guidance, the right common femoral vein was accessed with a micro puncture kit ultimately allowing placement of a 6 French, 35 cm vascular sheath. Slightly caudal to this initial access, the right common femoral was again accessed with an additional micropuncture kit ultimately allowing placement of an additional 6 French, 35 cm vascular sheath. Ultrasound images were saved for procedural documentation purposes. With the use of a glidewire, a vertebral catheter was advanced into the main pulmonary artery and a limited central pulmonary arteriogram was performed. Pressure measurements were attempted to be obtained from the level the main pulmonary artery however this ultimately proved unsuccessful secondary to measurement device malfunction. The vertebral catheter was advanced into the distal branch of the left lower lobe pulmonary artery. Limited contrast injection confirmed appropriate positioning. Over an exchange length Rosen wire, the vertebral  catheter was exchanged for a 105/12 cm multi side-hole EKOS ultrasound assisted infusion catheter. With the use of a glidewire, a vertebral catheter was advanced into a distal branch of the right lower lobe pulmonary artery. Limited contrast injection confirmed appropriate positioning. Over an exchange length Rosen wire, the pigtail catheter was exchanged for a 105/18 cm multi side-hole EKOS ultrasound assisted infusion catheter. A postprocedural fluoroscopic image was obtained to document final catheter positioning. The external catheter tubing was secured at the right thigh and the lytic therapy was initiated. The patient tolerated the procedure well without immediate postprocedural complication. FINDINGS: Limited central pulmonary arteriogram was performed to document appropriate catheter positioning. As above, pressure measurements unable to be obtained secondary to pressure measurement device malfunction. Following the procedure, both ultrasound assisted infusion catheter tips terminate within the distal aspects of the bilateral lower lobe sub segmental pulmonary arteries. IMPRESSION: Successful fluoroscopic guided initiation of bilateral ultrasound assisted catheter directed pulmonary arterial lysis for sub massive pulmonary embolism and right-sided heart strain. PLAN: - Will proceed with 12 hour bilateral pulmonary arterial catheter directed thrombolysis. - Will discussed appropriateness of placement of an IVC filter in the setting of left lower extremity DVT with providing ICU attending prior to removal of infusion catheters. Electronically Signed   By: Sandi Mariscal M.D.   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 Request made for placement of bilateral infusion catheters for  the initiation pulmonary arterial thrombolysis EXAM: 1. ULTRASOUND GUIDANCE FOR VENOUS  ACCESS X2 2. PULMONARY ARTERIOGRAPHY 3. FLUOROSCOPIC GUIDED PLACEMENT OF BILATERAL PULMONARY ARTERIAL LYTIC INFUSION CATHETERS COMPARISON:  Chest CTA-08/22/2018 MEDICATIONS: None ANESTHESIA/SEDATION: Moderate (conscious) sedation was employed during this procedure. A total of Versed 1 mg and Fentanyl 50 mcg was administered intravenously. Moderate Sedation Time: 40 minutes. The patient's level of consciousness and vital signs were monitored continuously by radiology nursing throughout the procedure under my direct supervision. CONTRAST:  10 mL OMNIPAQUE IOHEXOL 300 MG/ML  SOLN FLUOROSCOPY TIME:  3 minutes, 36 seconds (24 mGy) COMPLICATIONS: None immediate. TECHNIQUE: Informed written consent was obtained from the patient after a discussion of the risks, benefits and alternatives to treatment. Questions regarding the procedure were encouraged and answered. A timeout was performed prior to the initiation of the procedure. Ultrasound scanning was performed of the right groin and demonstrated wide patency of the right common femoral vein. As such, the right common femoral vein was selected venous access. The right groin was prepped and draped in the usual sterile fashion, and a sterile drape was applied covering the operative field. Maximum barrier sterile technique with sterile gowns and gloves were used for the procedure. A timeout was performed prior to the initiation of the procedure. Local anesthesia was provided with 1% lidocaine. Under direct ultrasound guidance, the right common femoral vein was accessed with a micro puncture kit ultimately allowing placement of a 6 French, 35 cm vascular sheath. Slightly caudal to this initial access, the right common femoral was again accessed with an additional micropuncture kit ultimately allowing placement of an additional 6 French, 35 cm vascular sheath. Ultrasound images were saved for procedural documentation purposes. With the use of a glidewire, a vertebral catheter  was advanced into the main pulmonary artery and a limited central pulmonary arteriogram was performed. Pressure measurements were attempted to be obtained from the level the main pulmonary artery however this ultimately proved unsuccessful secondary to measurement device malfunction. The vertebral catheter was advanced into the distal branch of the left lower lobe pulmonary artery. Limited contrast injection confirmed appropriate positioning. Over an exchange length Rosen wire, the vertebral catheter was exchanged for a 105/12 cm multi side-hole EKOS ultrasound assisted infusion catheter. With the use of a glidewire, a vertebral catheter was advanced into a distal branch of the right lower lobe pulmonary artery. Limited contrast injection confirmed appropriate positioning. Over an exchange length Rosen wire, the pigtail catheter was exchanged for a 105/18 cm multi side-hole EKOS ultrasound assisted infusion catheter. A postprocedural fluoroscopic image was obtained to document final catheter positioning. The external catheter tubing was secured at the right thigh and the lytic therapy was initiated. The patient tolerated the procedure well without immediate postprocedural complication. FINDINGS: Limited central pulmonary arteriogram was performed to document appropriate catheter positioning. As above, pressure measurements unable to be obtained secondary to pressure measurement device malfunction. Following the procedure, both ultrasound assisted infusion catheter tips terminate within the distal aspects of the bilateral lower lobe sub segmental pulmonary arteries. IMPRESSION: Successful fluoroscopic guided initiation of bilateral ultrasound assisted catheter directed pulmonary arterial lysis for sub massive pulmonary embolism and right-sided heart strain. PLAN: - Will proceed with 12 hour bilateral pulmonary arterial catheter directed thrombolysis. - Will discussed appropriateness of placement of an IVC filter in  the setting of left lower extremity DVT with providing ICU attending prior to removal of infusion catheters. Electronically Signed   By: Sandi Mariscal M.D.   On: 08/23/2018 08:38  US Abdomen Limited  Result Date: 08/22/2018 CLINICAL DATA:  Elevated LFTs EXAM: ULTRASOUND ABDOMEN LIMITED RIGHT UPPER QUADRANT COMPARISON:  None. FINDINGS: Gallbladder: No visible gallstones. Gallbladder wall is mildly thickened at 6 mm. Negative sonographic Murphy's. Common bile duct: Diameter: Normal caliber, 3 mm Liver: Increased echotexture compatible with fatty infiltration. No focal abnormality or biliary ductal dilatation. Portal vein is patent on color Doppler imaging with normal direction of blood flow towards the liver. IMPRESSION: Fatty infiltration of the liver. Gallbladder wall thickening without visible stones or sonographic Murphy sign. This may be related to liver disease or chronic cholecystitis. Electronically Signed   By: Rolm Baptise M.D.   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 Request made for placement of bilateral infusion catheters for the initiation pulmonary arterial thrombolysis EXAM: 1. ULTRASOUND GUIDANCE FOR VENOUS ACCESS X2 2. PULMONARY ARTERIOGRAPHY 3. FLUOROSCOPIC GUIDED PLACEMENT OF BILATERAL PULMONARY ARTERIAL LYTIC INFUSION CATHETERS COMPARISON:  Chest CTA-08/22/2018 MEDICATIONS: None ANESTHESIA/SEDATION: Moderate (conscious) sedation was employed during this procedure. A total of Versed 1 mg and Fentanyl 50 mcg was administered intravenously. Moderate Sedation Time: 40 minutes. The patient's level of consciousness and vital signs were monitored continuously by radiology nursing throughout the procedure under my direct supervision. CONTRAST:  10 mL OMNIPAQUE IOHEXOL 300 MG/ML  SOLN FLUOROSCOPY TIME:  3 minutes, 36 seconds (24 mGy) COMPLICATIONS: None immediate.  TECHNIQUE: Informed written consent was obtained from the patient after a discussion of the risks, benefits and alternatives to treatment. Questions regarding the procedure were encouraged and answered. A timeout was performed prior to the initiation of the procedure. Ultrasound scanning was performed of the right groin and demonstrated wide patency of the right common femoral vein. As such, the right common femoral vein was selected venous access. The right groin was prepped and draped in the usual sterile fashion, and a sterile drape was applied covering the operative field. Maximum barrier sterile technique with sterile gowns and gloves were used for the procedure. A timeout was performed prior to the initiation of the procedure. Local anesthesia was provided with 1% lidocaine. Under direct ultrasound guidance, the right common femoral vein was accessed with a micro puncture kit ultimately allowing placement of a 6 French, 35 cm vascular sheath. Slightly caudal to this initial access, the right common femoral was again accessed with an additional micropuncture kit ultimately allowing placement of an additional 6 French, 35 cm vascular sheath. Ultrasound images were saved for procedural documentation purposes. With the use of a glidewire, a vertebral catheter was advanced into the main pulmonary artery and a limited central pulmonary arteriogram was performed. Pressure measurements were attempted to be obtained from the level the main pulmonary artery however this ultimately proved unsuccessful secondary to measurement device malfunction. The vertebral catheter was advanced into the distal branch of the left lower lobe pulmonary artery. Limited contrast injection confirmed appropriate positioning. Over an exchange length Rosen wire, the vertebral catheter was exchanged for a 105/12 cm multi side-hole EKOS ultrasound assisted infusion catheter. With the use of a glidewire, a vertebral catheter was advanced into a  distal branch of the right lower lobe pulmonary artery. Limited contrast injection confirmed appropriate positioning. Over an exchange length Rosen wire, the pigtail catheter was exchanged for a 105/18 cm multi side-hole EKOS ultrasound assisted infusion catheter. A postprocedural fluoroscopic image was obtained to document final catheter positioning. The external catheter tubing  was secured at the right thigh and the lytic therapy was initiated. The patient tolerated the procedure well without immediate postprocedural complication. FINDINGS: Limited central pulmonary arteriogram was performed to document appropriate catheter positioning. As above, pressure measurements unable to be obtained secondary to pressure measurement device malfunction. Following the procedure, both ultrasound assisted infusion catheter tips terminate within the distal aspects of the bilateral lower lobe sub segmental pulmonary arteries. IMPRESSION: Successful fluoroscopic guided initiation of bilateral ultrasound assisted catheter directed pulmonary arterial lysis for sub massive pulmonary embolism and right-sided heart strain. PLAN: - Will proceed with 12 hour bilateral pulmonary arterial catheter directed thrombolysis. - Will discussed appropriateness of placement of an IVC filter in the setting of left lower extremity DVT with providing ICU attending prior to removal of infusion catheters. Electronically Signed   By: Sandi Mariscal M.D.   On: 08/23/2018 08:38   Dg Chest Port 1 View  Result Date: 08/23/2018 CLINICAL DATA:  Post initiation of bilateral pulmonary arterial catheter directed thrombolysis. Evaluate pulmonary infusion catheter positioning. EXAM: PORTABLE CHEST 1 VIEW COMPARISON:  Chest radiograph - 08/21/2018; initiation of bilateral pulmonary arterial catheter directed thrombolysis - 08/22/2018; chest CT - 08/22/2018 FINDINGS: Grossly unchanged cardiac silhouette and mediastinal contours. Unchanged positioning of bilateral  pulmonary arterial infusion catheters with tips overlying the expected location of the bilateral lower lobe subsegmental pulmonary arteries the right approximately 15 cm from the expected location of the main pulmonary artery and the left approximately 17 cm. Stable positioning of right jugular approach port a catheter with tip projected over the superior cavoatrial junction. No focal airspace opacities. No pleural effusion or pneumothorax no evidence of edema. No acute osseous abnormalities. IMPRESSION: Unchanged positioning of bilateral pulmonary arterial lytic infusion catheters. Electronically Signed   By: Sandi Mariscal M.D.   On: 08/23/2018 09:00   Dg Chest Port 1 View  Result Date: 08/21/2018 CLINICAL DATA:  Head neck cancer. Hyperglycemia. EXAM: PORTABLE CHEST 1 VIEW COMPARISON:  None. FINDINGS: The heart size and mediastinal contours are within normal limits. Both lungs are clear. The visualized skeletal structures are unremarkable. Port-A-Cath tip proximal RIGHT atrium. IMPRESSION: No active disease. Electronically Signed   By: Staci Righter M.D.   On: 08/21/2018 18:42   Vas [place] Lower Extremity Venous (dvt)  Result Date: 08/24/2018  Lower Venous Study Indications: Pulmonary embolism.  Performing Technologist: Oliver Hum RVT  Examination Guidelines: A complete evaluation includes B-mode imaging, spectral Doppler, color Doppler, and power Doppler as needed of all accessible portions of each vessel. Bilateral testing is considered an integral part of a complete examination. Limited examinations for reoccurring indications may be performed as noted.  Right Venous Findings: +---------+---------------+---------+-----------+----------+-------+          CompressibilityPhasicitySpontaneityPropertiesSummary +---------+---------------+---------+-----------+----------+-------+ CFV      Full           Yes      Yes                           +---------+---------------+---------+-----------+----------+-------+ SFJ      Full                                                 +---------+---------------+---------+-----------+----------+-------+ FV Prox  Full                                                 +---------+---------------+---------+-----------+----------+-------+  FV Mid   Full                                                 +---------+---------------+---------+-----------+----------+-------+ FV DistalFull                                                 +---------+---------------+---------+-----------+----------+-------+ PFV      Full                                                 +---------+---------------+---------+-----------+----------+-------+ POP      Full           Yes      Yes                          +---------+---------------+---------+-----------+----------+-------+ PTV      Full                                                 +---------+---------------+---------+-----------+----------+-------+ PERO     Full                                                 +---------+---------------+---------+-----------+----------+-------+  Left Venous Findings: +---------+---------------+---------+-----------+----------+-------+          CompressibilityPhasicitySpontaneityPropertiesSummary +---------+---------------+---------+-----------+----------+-------+ CFV      Full           No       No                           +---------+---------------+---------+-----------+----------+-------+ SFJ      Full                                                 +---------+---------------+---------+-----------+----------+-------+ FV Prox  Full                                                 +---------+---------------+---------+-----------+----------+-------+ FV Mid   Full                                                  +---------+---------------+---------+-----------+----------+-------+ FV DistalFull                                                 +---------+---------------+---------+-----------+----------+-------+  PFV      Full                                                 +---------+---------------+---------+-----------+----------+-------+ POP      Partial        No       No                   Acute   +---------+---------------+---------+-----------+----------+-------+ PTV      Partial                                      Acute   +---------+---------------+---------+-----------+----------+-------+ PERO     None                                         Acute   +---------+---------------+---------+-----------+----------+-------+ Gastroc  Full                                                 +---------+---------------+---------+-----------+----------+-------+    Summary: Right: There is no evidence of deep vein thrombosis in the lower extremity. Left: Findings consistent with acute deep vein thrombosis involving the left popliteal vein, left posterior tibial vein, and left peroneal vein. No cystic structure found in the popliteal fossa.  *See table(s) above for measurements and observations. Electronically signed by Monica Martinez MD on 08/24/2018 at 9:04:47 AM.    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 Request made for placement of bilateral infusion catheters for the initiation pulmonary arterial thrombolysis EXAM: 1. ULTRASOUND GUIDANCE FOR VENOUS ACCESS X2 2. PULMONARY ARTERIOGRAPHY 3. FLUOROSCOPIC GUIDED PLACEMENT OF BILATERAL PULMONARY ARTERIAL LYTIC INFUSION CATHETERS COMPARISON:  Chest CTA-08/22/2018 MEDICATIONS: None ANESTHESIA/SEDATION: Moderate (conscious) sedation was employed during this procedure. A total of Versed 1 mg and Fentanyl 50 mcg was  administered intravenously. Moderate Sedation Time: 40 minutes. The patient's level of consciousness and vital signs were monitored continuously by radiology nursing throughout the procedure under my direct supervision. CONTRAST:  10 mL OMNIPAQUE IOHEXOL 300 MG/ML  SOLN FLUOROSCOPY TIME:  3 minutes, 36 seconds (24 mGy) COMPLICATIONS: None immediate. TECHNIQUE: Informed written consent was obtained from the patient after a discussion of the risks, benefits and alternatives to treatment. Questions regarding the procedure were encouraged and answered. A timeout was performed prior to the initiation of the procedure. Ultrasound scanning was performed of the right groin and demonstrated wide patency of the right common femoral vein. As such, the right common femoral vein was selected venous access. The right groin was prepped and draped in the usual sterile fashion, and a sterile drape was applied covering the operative field. Maximum barrier sterile technique with sterile gowns and gloves were used for the procedure. A timeout was performed prior to the initiation of the procedure. Local anesthesia was provided with 1% lidocaine. Under direct ultrasound guidance, the right common femoral vein was accessed with a micro puncture kit ultimately allowing placement of  a 6 [place], 35 cm vascular sheath. Slightly caudal to this initial access, the right common femoral was again accessed with an additional micropuncture kit ultimately allowing placement of an additional 6 French, 35 cm vascular sheath. Ultrasound images were saved for procedural documentation purposes. With the use of a glidewire, a vertebral catheter was advanced into the main pulmonary artery and a limited central pulmonary arteriogram was performed. Pressure measurements were attempted to be obtained from the level the main pulmonary artery however this ultimately proved unsuccessful secondary to measurement device malfunction. The vertebral catheter was  advanced into the distal branch of the left lower lobe pulmonary artery. Limited contrast injection confirmed appropriate positioning. Over an exchange length Rosen wire, the vertebral catheter was exchanged for a 105/12 cm multi side-hole EKOS ultrasound assisted infusion catheter. With the use of a glidewire, a vertebral catheter was advanced into a distal branch of the right lower lobe pulmonary artery. Limited contrast injection confirmed appropriate positioning. Over an exchange length Rosen wire, the pigtail catheter was exchanged for a 105/18 cm multi side-hole EKOS ultrasound assisted infusion catheter. A postprocedural fluoroscopic image was obtained to document final catheter positioning. The external catheter tubing was secured at the right thigh and the lytic therapy was initiated. The patient tolerated the procedure well without immediate postprocedural complication. FINDINGS: Limited central pulmonary arteriogram was performed to document appropriate catheter positioning. As above, pressure measurements unable to be obtained secondary to pressure measurement device malfunction. Following the procedure, both ultrasound assisted infusion catheter tips terminate within the distal aspects of the bilateral lower lobe sub segmental pulmonary arteries. IMPRESSION: Successful fluoroscopic guided initiation of bilateral ultrasound assisted catheter directed pulmonary arterial lysis for sub massive pulmonary embolism and right-sided heart strain. PLAN: - Will proceed with 12 hour bilateral pulmonary arterial catheter directed thrombolysis. - Will discussed appropriateness of placement of an IVC filter in the setting of left lower extremity DVT with providing ICU attending prior to removal of infusion catheters. Electronically Signed   By: Sandi Mariscal M.D.   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 Request made for placement of bilateral infusion catheters for the initiation pulmonary arterial thrombolysis EXAM: 1. ULTRASOUND GUIDANCE FOR VENOUS ACCESS X2 2. PULMONARY ARTERIOGRAPHY 3. FLUOROSCOPIC GUIDED PLACEMENT OF BILATERAL PULMONARY ARTERIAL LYTIC INFUSION CATHETERS COMPARISON:  Chest CTA-08/22/2018 MEDICATIONS: None ANESTHESIA/SEDATION: Moderate (conscious) sedation was employed during this procedure. A total of Versed 1 mg and Fentanyl 50 mcg was administered intravenously. Moderate Sedation Time: 40 minutes. The patient's level of consciousness and vital signs were monitored continuously by radiology nursing throughout the procedure under my direct supervision. CONTRAST:  10 mL OMNIPAQUE IOHEXOL 300 MG/ML  SOLN FLUOROSCOPY TIME:  3 minutes, 36 seconds (24 mGy) COMPLICATIONS: None immediate. TECHNIQUE: Informed written consent was obtained from the patient after a discussion of the risks, benefits and alternatives to treatment. Questions regarding the procedure were encouraged and answered. A timeout was performed prior to the initiation of the procedure. Ultrasound scanning was performed of the right groin and demonstrated wide patency of the right common femoral vein. As such, the right common femoral vein was selected venous access. The right groin was prepped and draped in the usual sterile fashion, and a sterile drape was applied covering the operative field. Maximum barrier sterile technique with sterile gowns and gloves were used for the procedure. A timeout was performed prior to  the initiation of the procedure. Local anesthesia was provided with 1% lidocaine. Under direct ultrasound guidance, the right common femoral vein was accessed with a micro puncture kit ultimately allowing placement of a 6 French, 35 cm vascular sheath. Slightly caudal to this initial access, the right common femoral was again accessed with an additional micropuncture kit ultimately allowing  placement of an additional 6 French, 35 cm vascular sheath. Ultrasound images were saved for procedural documentation purposes. With the use of a glidewire, a vertebral catheter was advanced into the main pulmonary artery and a limited central pulmonary arteriogram was performed. Pressure measurements were attempted to be obtained from the level the main pulmonary artery however this ultimately proved unsuccessful secondary to measurement device malfunction. The vertebral catheter was advanced into the distal branch of the left lower lobe pulmonary artery. Limited contrast injection confirmed appropriate positioning. Over an exchange length Rosen wire, the vertebral catheter was exchanged for a 105/12 cm multi side-hole EKOS ultrasound assisted infusion catheter. With the use of a glidewire, a vertebral catheter was advanced into a distal branch of the right lower lobe pulmonary artery. Limited contrast injection confirmed appropriate positioning. Over an exchange length Rosen wire, the pigtail catheter was exchanged for a 105/18 cm multi side-hole EKOS ultrasound assisted infusion catheter. A postprocedural fluoroscopic image was obtained to document final catheter positioning. The external catheter tubing was secured at the right thigh and the lytic therapy was initiated. The patient tolerated the procedure well without immediate postprocedural complication. FINDINGS: Limited central pulmonary arteriogram was performed to document appropriate catheter positioning. As above, pressure measurements unable to be obtained secondary to pressure measurement device malfunction. Following the procedure, both ultrasound assisted infusion catheter tips terminate within the distal aspects of the bilateral lower lobe sub segmental pulmonary arteries. IMPRESSION: Successful fluoroscopic guided initiation of bilateral ultrasound assisted catheter directed pulmonary arterial lysis for sub massive pulmonary embolism and  right-sided heart strain. PLAN: - Will proceed with 12 hour bilateral pulmonary arterial catheter directed thrombolysis. - Will discussed appropriateness of placement of an IVC filter in the setting of left lower extremity DVT with providing ICU attending prior to removal of infusion catheters. Electronically Signed   By: Sandi Mariscal M.D.   On: 08/23/2018 08:38   Ir Jacolyn Reedy F/u Eval Art/ven Final Day (ms)  Result Date: 08/23/2018 INDICATION: History of sub massive pulmonary embolism, post initiation of bilateral catheter directed pulmonary arterial thrombolysis. Request made for pulmonary arterial pressure measurement prior to removal of infusion catheters. Discussion of appropriateness of proceeding with IVC filter placement for temporary caval interruption purposes was discussed with ICU attending, Dr. Chase Caller, however the decision was made not to proceed with filter placement this time as the patient appears to be tolerating initiated anticoagulation without incident. EXAM: BEDSIDE PULMONARY ARTERIAL PRESSURE MEASUREMENT AND PULMONARY INFUSION CATHETER REMOVAL COMPARISON:  Chest radiograph - earlier same day; Initiation of bilateral pulmonary arterial catheter directed thrombolysis - 08/22/2018; chest CT-08/22/2018 MEDICATIONS: None CONTRAST:  None FLUOROSCOPY TIME:  None COMPLICATIONS: None immediate. TECHNIQUE: Review of chest radiograph performed earlier this morning demonstrates unchanged positioning of bilateral pulmonary arterial infusion catheters. The patient was positioned supine on his hospital bed. The right pulmonary arterial catheter infusion wire was removed and the multi side-hole infusion catheter was retracted approximately 15 cm to the level of the main pulmonary artery. Pressure measurements were acquired from this location. At this point, the procedure was terminated. All wires, catheters and sheaths were removed from the patient. Hemostasis was achieved at the  right groin access site with  manual compression. A dressing was placed. The patient tolerated the procedure well without immediate postprocedural complication. FINDINGS: Preprocedural spot chest radiograph demonstrates unchanged positioning of bilateral pulmonary arterial infusion catheters. The right pulmonary arterial infusion catheter was retracted approximately 15 cm to the level of the main pulmonary artery. Acquired pressure measurements as follows: Preprocedural main pulmonary artery - unable to be obtained secondary to device malfunction. Postprocedural main pulmonary artery - 44/26; mean - 33 IMPRESSION: Technically successful completion of bilateral catheter directed pulmonary arterial thrombolysis. Electronically Signed   By: Sandi Mariscal M.D.   On: 08/23/2018 10:14

## 2018-09-12 LAB — TSH: TSH: 1.262 u[IU]/mL (ref 0.320–4.118)

## 2018-09-18 DIAGNOSIS — C099 Malignant neoplasm of tonsil, unspecified: Secondary | ICD-10-CM | POA: Diagnosis not present

## 2018-09-18 DIAGNOSIS — I2699 Other pulmonary embolism without acute cor pulmonale: Secondary | ICD-10-CM | POA: Diagnosis not present

## 2018-09-18 DIAGNOSIS — G4733 Obstructive sleep apnea (adult) (pediatric): Secondary | ICD-10-CM | POA: Diagnosis not present

## 2018-09-18 DIAGNOSIS — E119 Type 2 diabetes mellitus without complications: Secondary | ICD-10-CM | POA: Diagnosis not present

## 2018-09-19 DIAGNOSIS — R600 Localized edema: Secondary | ICD-10-CM | POA: Diagnosis not present

## 2018-09-19 LAB — GLUCOSE, CAPILLARY: Glucose-Capillary: >600 mg/dL (ref 70–99)

## 2018-09-19 NOTE — Progress Notes (Signed)
error 

## 2018-09-22 ENCOUNTER — Ambulatory Visit
Admission: RE | Admit: 2018-09-22 | Discharge: 2018-09-22 | Disposition: A | Discharge status: Home / Self Care | Payer: Commercial Managed Care - PPO | Source: Ambulatory Visit | Attending: Radiation Oncology | Admitting: Radiation Oncology

## 2018-09-22 ENCOUNTER — Encounter: Payer: Self-pay | Admitting: *Deleted

## 2018-09-22 DIAGNOSIS — C01 Malignant neoplasm of base of tongue: Secondary | ICD-10-CM

## 2018-09-22 NOTE — Progress Notes (Signed)
Radiation Oncology         [phone] ________________________________  Name: Glenn Goodman MRN: [id number]  Date: 09/22/2018  DOB: [birth year]  Follow-Up TELEMEDICINE NOTE  CC: [name], MD  Melissa Montane, MD  Diagnosis and Prior Radiotherapy:       ICD-10-CM   1. 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 Taking dulcolax for constipation with some good effect. Still has some nausea.  Still has some soreness with swallowing. Secretions have improved a lot. Skin healing over neck.  Recovering from saddle embolus that required inpatient stay.     Nausea helped somewhat by ondansetron.   ALLERGIES:  has No Known Allergies.  Meds: Current Outpatient Medications  Medication Sig Dispense Refill  . apixaban (ELIQUIS) 5 MG TABS tablet 40m po bid for 7 days then 5103mpo bid , for 30days supply. 60 tablet 0  . bisacodyl (DULCOLAX) 5 MG EC tablet Take 5 mg by mouth 2 (two) times daily.    . blood glucose meter kit and supplies KIT Dispense based on patient and insurance preference. Use up to four times daily as directed. (FOR ICD-9 250.00, 250.01). 1 each 0  . insulin glargine (LANTUS) 100 unit/mL SOPN Inject 0.18 mLs (18 Units total) into the skin at bedtime for 30 days. 15 mL 0  . insulin lispro (HUMALOG KWIKPEN) 100 UNIT/ML KwikPen Before each meal 3 times a day 140-199 - 2 units; 200-250  4units; 251-299  6 units;  300-349 - 8 units; 350 or above 10 units. 15 mL 0  . Insulin Pen Needle (PEN NEEDLES 3/16") 31G X 5 MM MISC For insulin injection. 100 each 0  . lidocaine (XYLOCAINE) 2 % solution Patient: Mix 1part 2% viscous lidocaine, 1part H20. Swish & swallow 1038mf diluted mixture, 55m89mefore meals and at bedtime, up to QID (Patient taking differently: Use as directed 10 mLs in the mouth or throat 4 (four) times daily as needed for mouth pain. Mix 1 part 2%  viscous lidocaine, 1 part H20. Swish & swallow 10 mL of diluted mixture, 30 min before meals and at bedtime, up to QID) 100 mL 5  . LORazepam (ATIVAN) 0.5 MG tablet Take 1 tablet (0.5 mg total) by mouth every 6 (six) hours as needed (Nausea or vomiting). 30 tablet 0  . metFORMIN (GLUCOPHAGE) 500 MG tablet Take 1 tablet (500 mg total) by mouth 2 (two) times daily with a meal. 60 tablet 0  . morphine (MSIR) 15 MG tablet Take 1 tablet (15 mg total) by mouth every 4 (four) hours as needed for severe pain. 60 tablet 0  . Neomycin-Bacitracin-Polymyxin (NEOSPORIN EX) Apply 1 application topically daily as needed (neck wound).    . Nutritional Supplements (FEEDING SUPPLEMENT, OSMOLITE 1.5 CAL,) LIQD Give 1.5 bottles Osmolite 1.5 Bid via PEG and 2 bottles Osmolite 1.5 via PEG for total of 7 bottles daily.  Give 60 cc free water before and after bolus feedings 4 times daily.  In addition consume or give via PEG 240 mL free water 3 times daily.  Please send formula and split gauze to patient.  He does not need other supplies at this time. (Patient taking differently: Take 355.5-474 mLs by mouth See admin instructions. Take 474 ml in the morning, 474 ml at lunch, 355.5 ml at dinner and 355.5 ml at bedtime via PEG for  total of 7 bottles daily. Give 60 cc free water before and after bolus feedings 4 times daily.  In addition consume or give via PEG 240 mL free water 3 times daily.  Please send formula and split gauze to patient) 7 Bottle 0  . Nutritional Supplements (PROMOD) LIQD Add 30 mL Promod TID. Continue 7 bottles Osmolite 1.5 daily. (Patient taking differently: Take 30 mLs by mouth 3 (three) times daily. ) 2 Bottle 1  . ondansetron (ZOFRAN) 8 MG tablet Take 1 tablet (8 mg total) by mouth 2 (two) times daily as needed. Start on the third day after chemotherapy. (Patient taking differently: Take 8 mg by mouth 2 (two) times daily as needed for nausea or vomiting. Start on the third day after chemotherapy.) 30 tablet 1   . pramipexole (MIRAPEX) 1 MG tablet Take 1 mg by mouth at bedtime.    . silver sulfADIAZINE (SILVADENE) 1 % cream Apply 1 application topically 2 (two) times daily.    . sodium fluoride (PREVIDENT 5000 PLUS) 1.1 % CREA dental cream Place 1 application onto teeth at bedtime. Apply to tooth brush. Brush teeth for 2 minutes. Spit out excess. DO NOT rinse afterwards. Repeat nightly.    . Wound Dressings (SONAFINE EX) Apply 1 application topically daily as needed (neck wound).     No current facility-administered medications for this encounter.    Facility-Administered Medications Ordered in Other Encounters  Medication Dose Route Frequency Provider Last Rate Last Dose  . heparin lock flush 100 unit/mL  500 Units Intravenous Once Ennever, Rudell Cobb, MD      . sodium chloride flush (NS) 0.9 % injection 10 mL  10 mL Intravenous PRN Volanda Napoleon, MD        Physical Findings: The patient is in no acute distress. Patient is alert and oriented. Wt Readings from Last 3 Encounters:  09/11/18 176 lb (79.8 kg)  08/29/18 167 lb (75.8 kg)  08/23/18 168 lb 14 oz (76.6 kg)    vitals were not taken for this visit. .    Lab Findings: Lab Results  Component Value Date   WBC 3.6 (L) 09/11/2018   HGB 10.2 (L) 09/11/2018   HCT 32.1 (L) 09/11/2018   MCV 104.9 (H) 09/11/2018   PLT 323 09/11/2018    Lab Results  Component Value Date   TSH 1.262 09/11/2018    Radiographic Findings: No results found.  Impression/Plan:    1) Head and Neck Cancer Status: recovering well from treamtent  2) Nutritional Status: continue to push intake as tolerated - appreciate input from Dory Peru, RD PEG tube: yes  3) Swallowing: as tolerated, continue SLP exercises  5) Dental: Encouraged to continue regular followup with dentistry, and dental hygiene including fluoride rinses.   6) Thyroid function:  WNL Lab Results  Component Value Date   TSH 1.262 09/11/2018    7)  Restaging CT scans in June, see  med/onc after.  Pt will call ENT to schedule follow-up. Follow-up with me in August. The patient was encouraged to call with any issues or questions before then.  May take Ativan PRN, up to every 8hrs, if nausea is interfering with feeding schedule despite Ondansetron.   This encounter was provided by telemedicine platform Webex due to pandemic precautions.  The patient has given verbal consent for this type of encounter and has been advised to only accept a meeting of this type in a secure network environment. The time spent during this encounter was over 15  minutes. The attendants for this meeting include Eppie Gibson  and [facility].  During the encounter, Eppie Gibson was located at [facility] Radiation Oncology Department.  Elam Ellis Sills was located at home.   _____________________________________   Eppie Gibson, MD

## 2018-09-23 ENCOUNTER — Ambulatory Visit
Admission: RE | Admit: 2018-09-23 | Discharge: 2018-09-23 | Disposition: A | Discharge status: Home / Self Care | Payer: Commercial Managed Care - PPO | Source: Ambulatory Visit | Attending: Radiation Oncology | Admitting: Radiation Oncology

## 2018-09-23 ENCOUNTER — Ambulatory Visit: Payer: Commercial Managed Care - PPO | Admitting: Nutrition

## 2018-09-23 NOTE — Progress Notes (Signed)
RD working remotely.  Nutrition follow up completed with patient and wife. Patient reports he began to swallow soft foods about 2 weeks ago and is interested in how to increase oral intake and decrease TF. Patient denies other nutrition impact symptoms. Reports he tolerated 1/2 apple today. Reports his blood sugar drops to mid 60's overnight so he is decreasing lantus per his doctor's orders. Currently using 5 bottles of Osmolite 1.5. Weight improved to 176 pounds on April 23.  Estimated Needs: 2200-2400 kcal, 100-125 grams protein, 2.4 L fluid  Nutrition Diagnosis: Inadequate oral intake continues.  Intervention: Educated patient to continue close contact with MD to monitor blood sugar and insulin dosage. Reviewed strategies for decreasing tube feeding and increasing oral intake. Encouraged oral intake of Ensure Enlive or Boost Plus to replace 1 carton of Osmolite 1.5 via tube. Reviewed role of protein powder in place of promod. Questions answered and teach back method used.  Monitoring, Evaluation, Goals: Patient will increase oral intake and decrease TF with minimal weight loss to allow feeding tube to be removed.  Next Visit: To be scheduled as needed. Patient will contact me for follow up as needed

## 2018-09-24 ENCOUNTER — Telehealth: Payer: Self-pay | Admitting: *Deleted

## 2018-09-24 ENCOUNTER — Encounter: Payer: Self-pay | Admitting: Radiation Oncology

## 2018-09-24 ENCOUNTER — Other Ambulatory Visit: Payer: Self-pay | Admitting: Radiation Oncology

## 2018-09-24 DIAGNOSIS — C01 Malignant neoplasm of base of tongue: Secondary | ICD-10-CM

## 2018-09-24 MED ORDER — FLUCONAZOLE 100 MG PO TABS: ORAL_TABLET | ORAL | 0 refills | Status: DC

## 2018-09-24 NOTE — Telephone Encounter (Signed)
CALLED PATIENT TO INFORM OF FU WITH DR. Isidore Moos ON 01-16-19 @ 3:40 PM, SPOKE WITH PATIENT AND HE IS AWARE OF THIS APPT.

## 2018-09-26 ENCOUNTER — Encounter: Payer: Self-pay | Admitting: *Deleted

## 2018-09-26 NOTE — Progress Notes (Signed)
A user error has taken place: encounter opened in error, closed for administrative reasons.

## 2018-09-26 NOTE — Progress Notes (Signed)
Oncology Nurse Navigator Documentation  Met with Mr. Cordner and his wife during WebEx post-tmt follow-up with Dr. Isidore Moos: He reported:  Seeing PCP for management of blood glucose which is currently ranging 74-122 mg/dL.  HS Lantus currently 16 units.  Difficulty with swallowing first couple of weeks s/p last RT but began to resolve soon after.  Ongoing intermittent nausea, taking Zofran BID.  Taking Dulcolax daily, BMs regular.  Walking around neighborhood for exercise.  Starting to eat soft foods eg eggs, goal is to increase oral nutrition and reduce Osmolite via PEG which is currently 7 bottles daily.  He is keeping a food diary, having regular conversations with Dory Peru, RD.  Taste buds returning.  Minimal throat pain, 3-4, does not require analgesic during the day, he takes morphine 15 mg tab at bedtime.  Neck tightness, has not been conducting HEP recently. He voiced understanding:  Dr. Pearlie Oyster suggestion to take Ativan before meals to mitigate nausea.  CT neck to be scheduled for late May/first week of June, he will see Dr. Maylon Peppers s/p scan, return to see Dr. Isidore Moos in August.  To contact Dr. Nicolette Bang to arrange follow-up. Navigator Interventions  Encouraged reinstatement of BID HEP as directed by SLP Glendell Docker.  Reviewed Post-treatment Guidelines.  Encouraged him to call me with needs/concerns.  [name], RN, BSN Head & Neck Oncology Nurse [place] at [place] [phone]

## 2018-09-30 NOTE — Progress Notes (Signed)
  Patient Name: Glenn Goodman MRN: [id number] DOB: 07/10/1960 Referring Physician: Melissa Montane (Profile Not Attached) Date of Service: 08/21/2018 [facility] [place]                                                        End Of Treatment Note  Diagnoses: C01-Malignant neoplasm of base of tongue  Cancer Staging Malignant neoplasm of base of tongue ([place]) Staging form: Pharynx - HPV-Mediated Oropharynx, AJCC 8th Edition - Clinical: Stage I (cT1, cN1, cM0, p16+) - Signed by Eppie Gibson, MD on 05/09/2018 - Pathologic: Stage I (pT1, pN1, cM0, p16+) - Signed by Eppie Gibson, MD on 06/11/2018  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 His pain is controlled by taking morphine at night. He is instiling 7 osmolite daily via his PEG tube and starting promod supplements. He is swallowing some food by mouth. He did develop confluent mucositis in his throat. The skin to his neck is erythematous with crusting desquamation, and he is applying Sonafine and Neosporin as needed.   Plan: The patient will follow-up with radiation oncology in one month, or sooner as needed. He was encouraged to continue pushing hydration and nutrition.  ________________________________________________  Eppie Gibson, MD  This document serves as a record of services personally performed by Eppie Gibson, MD. It was created on her behalf  by Rae Lips, a trained medical scribe. The creation of this record is based on the scribe's personal observations and the provider's statements to them. This document has been checked and approved by the attending provider.

## 2018-10-01 ENCOUNTER — Telehealth: Payer: Self-pay | Admitting: Hematology

## 2018-10-01 NOTE — Telephone Encounter (Signed)
Appts rescheduled due to provider PAL/ letter/calendar mailed

## 2018-10-06 ENCOUNTER — Other Ambulatory Visit: Payer: Self-pay | Admitting: Radiation Oncology

## 2018-10-06 ENCOUNTER — Ambulatory Visit: Payer: Commercial Managed Care - PPO | Attending: Family Medicine

## 2018-10-06 ENCOUNTER — Other Ambulatory Visit: Payer: Self-pay

## 2018-10-06 DIAGNOSIS — C01 Malignant neoplasm of base of tongue: Secondary | ICD-10-CM

## 2018-10-06 DIAGNOSIS — R1310 Dysphagia, unspecified: Secondary | ICD-10-CM

## 2018-10-06 NOTE — Therapy (Signed)
[facility] [address] [place], [place], [address] Phone: [phone]   Fax:  [phone]  Speech Language Pathology Treatment  Patient Details  Name: Glenn Goodman MRN: [id number] Date of Birth: 1961-04-05 Referring Provider (SLP): Eppie Gibson, MD   Encounter Date: 10/06/2018  End of Session - 10/06/18 1004    Visit Number  4    Number of Visits  7    Date for SLP Re-Evaluation  12/16/18    SLP Start Time  0908    SLP Stop Time   0953    SLP Time Calculation (min)  45 min    Activity Tolerance  Patient tolerated treatment well       Past Medical History:  Diagnosis Date  . Arthritis   . Cancer ([place])   . OSA on CPAP   . Restless leg syndrome     Past Surgical History:  Procedure Laterality Date  . DIRECT LARYNGOSCOPY Left 04/22/2018   Dr. Janace Hoard  . FACIAL FRACTURE SURGERY Left 1986  . IR ANGIOGRAM PULMONARY BILATERAL SELECTIVE  08/22/2018  . IR ANGIOGRAM SELECTIVE EACH ADDITIONAL VESSEL  08/22/2018  . IR ANGIOGRAM SELECTIVE EACH ADDITIONAL VESSEL  08/22/2018  . IR GASTROSTOMY TUBE MOD SED  06/30/2018  . IR IMAGING GUIDED PORT INSERTION  06/30/2018  . IR INFUSION THROMBOL ARTERIAL INITIAL (MS)  08/22/2018  . IR INFUSION THROMBOL ARTERIAL INITIAL (MS)  08/22/2018  . IR THROMB F/U EVAL ART/VEN FINAL DAY (MS)  08/23/2018  . IR US GUIDE VASC ACCESS RIGHT  08/22/2018  . MODIFIED RADICAL NECK DISSECTION Left 05/26/2018   Dr. Nicolette Bang, [facility]  . transoral robotic surgery  05/26/2018   [facility], Dr. Nicolette Bang [facility]    There were no vitals filed for this visit.         ADULT SLP TREATMENT - 10/06/18 0914      General Information   Behavior/Cognition  Alert;Cooperative;Pleasant mood      Treatment Provided   Treatment provided  Dysphagia      Dysphagia Treatment   Temperature Spikes Noted  No    Respiratory Status  Room air    Oral Cavity - Dentition  Adequate natural dentition    Treatment Methods  Therapeutic  exercise;Compensation strategy training;Patient/caregiver education;Skilled observation    Type of PO's observed  Thin liquids    Liquids provided via  Cup    Oral Phase Signs & Symptoms  --   none noted   Pharyngeal Phase Signs & Symptoms  --   none noted   Amount of cueing  Modified independent    Other treatment/comments  Reports early in May pt had odynophagia, caused by thrush. Pt is completing HEP but once day with all reps. Pt is trying to incr PO intake and reduce tube feeds - has spoken with Dory Peru re: this. Complications with thrush have decr'd headway with this.  With HEP, pt was observed to complete all exercises iwth modified independence. SLP replaced vocal adduction with super-supraglottic and taught pt this exercise. Return demonstrated with min A occasionally with fading SLP cues to modified independnnt. SLP made pt aware to incr hold times or incr. reps to challenge himself with HEP, PRN. SLP strongly encouraged pt to complete HEP at least x2/day. Pt stated "I think it's just discipline." as reason for only once/day at this time.      Pain Assessment   Pain Assessment  0-10    Pain Score  5  Pain Location  mouth      Assessment / Recommendations / Plan   Plan  Goals updated;Continue with current plan of care      Progression Toward Goals   Progression toward goals  Progressing toward goals     ST Therapy Telehealth Visit:  I connected with pt today at 1008 by Webex video conference and verified that I am speaking with the correct person using pt facial identity.  I discussed the limitations, risks, security and privacy concerns of performing an evaluation and management service by Webex and the availability of in person appointments.  I also discussed with the patient that there may be a patient responsible charge related to this service. The patient expressed understanding and agreed to proceed.    The patient's address was confirmed.  Identified to the patient  that therapist is a licensed SLP in the state of [place].  Verified phone # as the one noted in [facility] to call in case of technical difficulties.     SLP Education - 10/06/18 1007    Education Details  super-supraglottic, reminded pt of food journal, overt s/s aspiration PNA    Person(s) Educated  Patient    Methods  Explanation;Demonstration;Verbal cues    Comprehension  Verbalized understanding;Verbal cues required;Returned demonstration;Need further instruction       SLP Short Term Goals - 10/06/18 1145      SLP SHORT TERM GOAL #1   Title  pt will complete HEP with rare min A over two sessions    Status  Achieved      SLP SHORT TERM GOAL #2   Title  pt will tell SLP why he is completing HEP     Status  Achieved   time period - 2 sessions     SLP SHORT TERM GOAL #3   Title  pt will tell SLP how a food journal can facilitate return to most normal diet    Status  Achieved       SLP Long Term Goals - 10/06/18 2585      SLP LONG TERM GOAL #1   Title  pt will complete HEP with modified independence x3 sessions    Baseline  07-06-18, 10-06-18    Time  1    Period  --   visits (visit #5)   Status  Revised      SLP LONG TERM GOAL #2   Title  pt will tell SLP 3 overt s/s of aspiration PNA with modified independence    Time  --   visit #5   Status  Achieved      SLP LONG TERM GOAL #3   Title  pt will report to SLP when he can change frequency of HEP to 2-3x/week x2 visits    Time  2    Period  --   visits (by visit #6)   Status  New      SLP LONG TERM GOAL #4   Title  pt will demo super-supraglottic swallow x2 sessions with modified independence    Time  2    Period  --   visits (by visit #6)   Status  New       Plan - 10/06/18 1008    Clinical Impression Statement  Visit took place via vido visit (WebEx) today, per medical director's directive (Dr. Eppie Gibson) due to COVID-19 pandemic. Pt with oropharyngeal swallowing described as difficult but manageable at the  current time, he drank thin liquids without overt  s/s aspiration x7.  See "other treatment/comments" for more details. Pt does not report overt s/s aspiration PNA today. The probability of swallowing difficulty increases dramatically with the pt history of chemo and radiation therapy. Pt will need to be followed by SLP for regular assessment of accurate HEP completion as well as for safety with POs both during and following treatment/s.    Speech Therapy Frequency  --   once approx every four weeks   Duration  --   6 sessions (7 total)   Treatment/Interventions  Aspiration precaution training;Diet toleration management by SLP;Pharyngeal strengthening exercises;Internal/external aids;Trials of upgraded texture/liquids;Compensatory strategies;Patient/family education;SLP instruction and feedback;Cueing hierarchy;Environmental controls    Potential to Achieve Goals  Good    SLP Home Exercise Plan  provided today    Consulted and Agree with Plan of Care  Patient       Patient will benefit from skilled therapeutic intervention in order to improve the following deficits and impairments:   Dysphagia, unspecified type    Problem List Patient Active Problem List   Diagnosis Date Noted  . Acute on chronic respiratory failure with hypoxia ([place])   . Saddle embolus of pulmonary artery ([place])   . Acute pulmonary embolism without acute cor pulmonale (H[facility])   . Deep vein thrombosis (DVT) of both lower extremities ([place])   . Leukopenia due to antineoplastic chemotherapy ([place])   . Antineoplastic chemotherapy induced anemia   . Dehydration   . LFT elevation   . Mucositis   . Pancytopenia ([place])   . Hypokalemia   . Hyperglycemia 08/21/2018  . Protein malnutrition ([place]) 07/30/2018  . AKI (acute kidney injury) ([place]) 07/23/2018  . Mucositis due to chemotherapy 07/23/2018  . Chemotherapy-induced nausea 07/17/2018  . Chemotherapy-induced diarrhea 07/17/2018  . Oral candidiasis 07/17/2018  . Port-A-Cath in  place 07/09/2018  . Sensorineural hearing loss (SNHL) of both ears 06/18/2018  . Odynophagia 06/18/2018  . Malignant neoplasm of base of tongue ([place]) 05/09/2018  . 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

## 2018-10-06 NOTE — Patient Instructions (Signed)
Signs of Aspiration Pneumonia   . Chest pain/tightness . Fever (can be low grade) . Cough  o With foul-smelling phlegm (sputum) o With sputum containing pus or blood o With greenish sputum . Fatigue  . Shortness of breath  . Wheezing   **IF YOU HAVE THESE SIGNS, CONTACT YOUR DOCTOR OR GO TO THE EMERGENCY DEPARTMENT OR URGENT CARE AS SOON AS POSSIBLE**     This was shared via shared screen [facility]) and pt took screenshot.

## 2018-10-08 ENCOUNTER — Encounter: Payer: Self-pay | Admitting: Radiation Oncology

## 2018-10-08 ENCOUNTER — Other Ambulatory Visit: Payer: Self-pay

## 2018-10-08 ENCOUNTER — Ambulatory Visit
Admission: RE | Admit: 2018-10-08 | Discharge: 2018-10-08 | Disposition: A | Discharge status: Home / Self Care | Payer: Commercial Managed Care - PPO | Source: Ambulatory Visit | Attending: Radiation Oncology | Admitting: Radiation Oncology

## 2018-10-08 VITALS — BP 145/89 | HR 106 | Temp 98.0°F | Resp 20 | Ht 68.0 in | Wt 177.5 lb

## 2018-10-08 DIAGNOSIS — C01 Malignant neoplasm of base of tongue: Secondary | ICD-10-CM | POA: Diagnosis present

## 2018-10-08 DIAGNOSIS — C770 Secondary and unspecified malignant neoplasm of lymph nodes of head, face and neck: Secondary | ICD-10-CM | POA: Insufficient documentation

## 2018-10-08 DIAGNOSIS — Z794 Long term (current) use of insulin: Secondary | ICD-10-CM | POA: Insufficient documentation

## 2018-10-08 DIAGNOSIS — B370 Candidal stomatitis: Secondary | ICD-10-CM | POA: Insufficient documentation

## 2018-10-08 DIAGNOSIS — Z7901 Long term (current) use of anticoagulants: Secondary | ICD-10-CM | POA: Diagnosis not present

## 2018-10-08 DIAGNOSIS — Z79899 Other long term (current) drug therapy: Secondary | ICD-10-CM | POA: Diagnosis not present

## 2018-10-08 DIAGNOSIS — Z923 Personal history of irradiation: Secondary | ICD-10-CM | POA: Diagnosis not present

## 2018-10-08 DIAGNOSIS — C099 Malignant neoplasm of tonsil, unspecified: Secondary | ICD-10-CM

## 2018-10-08 MED ORDER — FLUCONAZOLE 100 MG PO TABS: ORAL_TABLET | ORAL | 0 refills | Status: DC

## 2018-10-08 NOTE — Patient Instructions (Signed)
Coronavirus (COVID-19) Are you at risk?  Are you at risk for the Coronavirus (COVID-19)?  To be considered HIGH RISK for Coronavirus (COVID-19), you have to meet the following criteria:  . Traveled to [place], [place], [place], [place] or [place]; or in the [place] to [place], [place], [place], or [place]; and have fever, cough, and shortness of breath within the last 2 weeks of travel OR . Been in close contact with a person diagnosed with COVID-19 within the last 2 weeks and have fever, cough, and shortness of breath . IF YOU DO NOT MEET THESE CRITERIA, YOU ARE CONSIDERED LOW RISK FOR COVID-19.  What to do if you are HIGH RISK for COVID-19?  . If you are having a medical emergency, call 911. . Seek medical care right away. Before you go to a doctor's office, urgent care or emergency department, call ahead and tell them about your recent travel, contact with someone diagnosed with COVID-19, and your symptoms. You should receive instructions from your physician's office regarding next steps of care.  . When you arrive at healthcare provider, tell the healthcare staff immediately you have returned from visiting [place], [place], [place], [place] or [place]; or traveled in the [place] to [place], [place], [place], or [place]; in the last two weeks or you have been in close contact with a person diagnosed with COVID-19 in the last 2 weeks.   . Tell the health care staff about your symptoms: fever, cough and shortness of breath. . After you have been seen by a medical provider, you will be either: o Tested for (COVID-19) and discharged home on quarantine except to seek medical care if symptoms worsen, and asked to  - Stay home and avoid contact with others until you get your results (4-5 days)  - Avoid travel on public transportation if possible (such as bus, train, or airplane) or o Sent to the Emergency Department by EMS for evaluation, COVID-19 testing, and possible  admission depending on your condition and test results.  What to do if you are LOW RISK for COVID-19?  Reduce your risk of any infection by using the same precautions used for avoiding the common cold or flu:  . Wash your hands often with soap and warm water for at least 20 seconds.  If soap and water are not readily available, use an alcohol-based hand sanitizer with at least 60% alcohol.  . If coughing or sneezing, cover your mouth and nose by coughing or sneezing into the elbow areas of your shirt or coat, into a tissue or into your sleeve (not your hands). . Avoid shaking hands with others and consider head nods or verbal greetings only. . Avoid touching your eyes, nose, or mouth with unwashed hands.  . Avoid close contact with people who are sick. . Avoid places or events with large numbers of people in one location, like concerts or sporting events. . Carefully consider travel plans you have or are making. . If you are planning any travel outside or inside the [place], visit the CDC's Travelers' Health webpage for the latest health notices. . If you have some symptoms but not all symptoms, continue to monitor at home and seek medical attention if your symptoms worsen. . If you are having a medical emergency, call 911.   ADDITIONAL HEALTHCARE OPTIONS FOR PATIENTS  [facility] / e-Visit: [email or web address]         [facility]: [phone]  [facility]: [phone]                   [facility]: [phone]   

## 2018-10-08 NOTE — Progress Notes (Signed)
Glenn Goodman presents for follow up of radiation completed 08/21/18 to his head and neck   Pain issues, if any: He reports soreness/pain to a mouth sore on the left side of his tongue.  Using a feeding tube?: Yes, He is instilling 5 bottles daily.  Weight changes, if any:  10/08/18 177.2 lb Wt Readings from Last 3 Encounters:  09/11/18 176 lb (79.8 kg)  08/29/18 167 lb (75.8 kg)  08/23/18 168 lb 14 oz (76.6 kg)   Swallowing issues, if any: He is eating softer foods like mashed potatoes, soup, cereal and peaches.  Smoking or chewing tobacco? No Using fluoride trays daily? Yes, about 3 times weekly. He will use fluoride toothpaste the other days.  Last ENT visit was on: Dr. Nicolette Bang last on 06/18/18, next visit scheduled for 10/29/18. Other notable issues, if any:  He reports continues thrush and presents for physical examination.   He is scheduled for a CT chest/neck on 10/20/18 and will see Dr. Maylon Peppers on 10/23/18.  BP (!) 145/89 (BP Location: Left Arm, Patient Position: Sitting)   Pulse (!) 106   Temp 98 F (36.7 C) (Oral)   Resp 20   Ht 5\' 8"  (1.727 m)   Wt 177 lb 8 oz (80.5 kg)   SpO2 100%   BMI 26.99 kg/m

## 2018-10-10 ENCOUNTER — Encounter: Payer: Self-pay | Admitting: Radiation Oncology

## 2018-10-10 NOTE — Progress Notes (Signed)
Radiation Oncology         [phone] ________________________________  Name: Glenn Goodman MRN: [id number]  Date: 10/08/2018  DOB: [birth year]  Follow-Up outpatient NOTE  CC: [name], MD  Melissa Montane, MD  Diagnosis and Prior Radiotherapy:       ICD-10-CM   1. Squamous cell carcinoma of left tonsil (HCC) C09.9 fluconazole (DIFLUCAN) 100 MG tablet  2. Malignant neoplasm of base of tongue ([facility]) C01     CHIEF COMPLAINT: thrush  Narrative: He is doing well overall.  He came in today due to perceived residual thrush. It got better on Clotrimazole and concurrent 2 wks of fluconazole.  ALLERGIES:  has No Known Allergies.  Meds: Current Outpatient Medications  Medication Sig Dispense Refill  . apixaban (ELIQUIS) 5 MG TABS tablet 15m po bid for 7 days then 547mpo bid , for 30days supply. 60 tablet 0  . bisacodyl (DULCOLAX) 5 MG EC tablet Take 5 mg by mouth 2 (two) times daily.    . blood glucose meter kit and supplies KIT Dispense based on patient and insurance preference. Use up to four times daily as directed. (FOR ICD-9 250.00, 250.01). 1 each 0  . fluconazole (DIFLUCAN) 100 MG tablet Take 1 tab daily for 7 more days. 7 tablet 0  . insulin glargine (LANTUS) 100 unit/mL SOPN Inject 0.18 mLs (18 Units total) into the skin at bedtime for 30 days. 15 mL 0  . insulin lispro (HUMALOG KWIKPEN) 100 UNIT/ML KwikPen Before each meal 3 times a day 140-199 - 2 units; 200-250  4units; 251-299  6 units;  300-349 - 8 units; 350 or above 10 units. 15 mL 0  . Insulin Pen Needle (PEN NEEDLES 3/16") 31G X 5 MM MISC For insulin injection. 100 each 0  . lidocaine (XYLOCAINE) 2 % solution Patient: Mix 1part 2% viscous lidocaine, 1part H20. Swish & swallow 1063mf diluted mixture, 81m40mefore meals and at bedtime, up to QID (Patient taking differently: Use as directed 10 mLs in the mouth or throat 4 (four) times daily as needed for mouth pain. Mix 1 part 2% viscous lidocaine, 1 part H20. Swish &  swallow 10 mL of diluted mixture, 30 min before meals and at bedtime, up to QID) 100 mL 5  . LORazepam (ATIVAN) 0.5 MG tablet Take 1 tablet (0.5 mg total) by mouth every 6 (six) hours as needed (Nausea or vomiting). 30 tablet 0  . metFORMIN (GLUCOPHAGE) 500 MG tablet Take 1 tablet (500 mg total) by mouth 2 (two) times daily with a meal. 60 tablet 0  . morphine (MSIR) 15 MG tablet Take 1 tablet (15 mg total) by mouth every 4 (four) hours as needed for severe pain. 60 tablet 0  . Neomycin-Bacitracin-Polymyxin (NEOSPORIN EX) Apply 1 application topically daily as needed (neck wound).    . Nutritional Supplements (FEEDING SUPPLEMENT, OSMOLITE 1.5 CAL,) LIQD Give 1.5 bottles Osmolite 1.5 Bid via PEG and 2 bottles Osmolite 1.5 via PEG for total of 7 bottles daily.  Give 60 cc free water before and after bolus feedings 4 times daily.  In addition consume or give via PEG 240 mL free water 3 times daily.  Please send formula and split gauze to patient.  He does not need other supplies at this time. (Patient taking differently: Take 355.5-474 mLs by mouth See admin instructions. Take 474 ml in the morning, 474 ml at lunch, 355.5 ml at dinner and 355.5 ml at bedtime via PEG for total of 7  bottles daily. Give 60 cc free water before and after bolus feedings 4 times daily.  In addition consume or give via PEG 240 mL free water 3 times daily.  Please send formula and split gauze to patient) 7 Bottle 0  . Nutritional Supplements (PROMOD) LIQD Add 30 mL Promod TID. Continue 7 bottles Osmolite 1.5 daily. (Patient taking differently: Take 30 mLs by mouth 3 (three) times daily. ) 2 Bottle 1  . ondansetron (ZOFRAN) 8 MG tablet Take 1 tablet (8 mg total) by mouth 2 (two) times daily as needed. Start on the third day after chemotherapy. (Patient taking differently: Take 8 mg by mouth 2 (two) times daily as needed for nausea or vomiting. Start on the third day after chemotherapy.) 30 tablet 1  . pramipexole (MIRAPEX) 1 MG tablet  Take 1 mg by mouth at bedtime.    . silver sulfADIAZINE (SILVADENE) 1 % cream Apply 1 application topically 2 (two) times daily.    . sodium fluoride (PREVIDENT 5000 PLUS) 1.1 % CREA dental cream Place 1 application onto teeth at bedtime. Apply to tooth brush. Brush teeth for 2 minutes. Spit out excess. DO NOT rinse afterwards. Repeat nightly.    . Wound Dressings (SONAFINE EX) Apply 1 application topically daily as needed (neck wound).     No current facility-administered medications for this encounter.    Facility-Administered Medications Ordered in Other Encounters  Medication Dose Route Frequency Provider Last Rate Last Dose  . heparin lock flush 100 unit/mL  500 Units Intravenous Once Ennever, Rudell Cobb, MD      . sodium chloride flush (NS) 0.9 % injection 10 mL  10 mL Intravenous PRN Volanda Napoleon, MD        Physical Findings: The patient is in no acute distress. Patient is alert and oriented. Wt Readings from Last 3 Encounters:  10/08/18 177 lb 8 oz (80.5 kg)  09/11/18 176 lb (79.8 kg)  08/29/18 167 lb (75.8 kg)    height is 5' 8"  (1.727 m) and weight is 177 lb 8 oz (80.5 kg). His oral temperature is 98 F (36.7 C). His blood pressure is 145/89 (abnormal) and his pulse is 106 (abnormal). His respiration is 20 and oxygen saturation is 100%. .    Nonspecific white coating to tongue - resolving mucositis/flora changes vs thrush.  No colonies growing on palate or buccal mucosa or FOM  Lab Findings: Lab Results  Component Value Date   WBC 3.6 (L) 09/11/2018   HGB 10.2 (L) 09/11/2018   HCT 32.1 (L) 09/11/2018   MCV 104.9 (H) 09/11/2018   PLT 323 09/11/2018    Lab Results  Component Value Date   TSH 1.262 09/11/2018    Radiographic Findings: No results found.  Impression/Plan:  It is hard to tell if this is residual thrush.  I recommended using a tongue scraper gently or brush his tongue with a soft bristle brush daily.  Will give another week of fluconazole.  Sometimes  residual mucositis lingers on the tongue in a similar way and is unrelated to fungal infection.  He is pleased w/ this plan.  _____________________________________   Eppie Gibson, MD

## 2018-10-17 ENCOUNTER — Other Ambulatory Visit: Payer: Self-pay

## 2018-10-17 ENCOUNTER — Ambulatory Visit: Payer: Self-pay | Admitting: Hematology

## 2018-10-20 ENCOUNTER — Encounter (HOSPITAL_BASED_OUTPATIENT_CLINIC_OR_DEPARTMENT_OTHER): Payer: Self-pay

## 2018-10-20 ENCOUNTER — Other Ambulatory Visit: Payer: Self-pay

## 2018-10-20 ENCOUNTER — Ambulatory Visit (HOSPITAL_BASED_OUTPATIENT_CLINIC_OR_DEPARTMENT_OTHER)
Admission: RE | Admit: 2018-10-20 | Discharge: 2018-10-20 | Disposition: A | Discharge status: Home / Self Care | Payer: Commercial Managed Care - PPO | Source: Ambulatory Visit | Attending: Hematology | Admitting: Hematology

## 2018-10-20 DIAGNOSIS — C099 Malignant neoplasm of tonsil, unspecified: Secondary | ICD-10-CM | POA: Insufficient documentation

## 2018-10-20 MED ORDER — IOHEXOL 300 MG/ML  SOLN
100.0000 mL | Freq: Once | INTRAMUSCULAR | Status: AC | PRN
  Administered 2018-10-20: 100 mL via INTRAVENOUS

## 2018-10-21 ENCOUNTER — Other Ambulatory Visit: Payer: Commercial Managed Care - PPO

## 2018-10-21 ENCOUNTER — Ambulatory Visit: Payer: Commercial Managed Care - PPO | Admitting: Hematology

## 2018-10-23 ENCOUNTER — Encounter: Payer: Self-pay | Admitting: Hematology

## 2018-10-23 ENCOUNTER — Inpatient Hospital Stay: Payer: Commercial Managed Care - PPO | Attending: Hematology

## 2018-10-23 ENCOUNTER — Inpatient Hospital Stay: Payer: Commercial Managed Care - PPO

## 2018-10-23 ENCOUNTER — Inpatient Hospital Stay (HOSPITAL_BASED_OUTPATIENT_CLINIC_OR_DEPARTMENT_OTHER): Payer: Commercial Managed Care - PPO | Admitting: Hematology

## 2018-10-23 ENCOUNTER — Other Ambulatory Visit: Payer: Self-pay

## 2018-10-23 VITALS — BP 139/79 | HR 89 | Temp 98.8°F | Resp 16 | Ht 68.0 in | Wt 178.1 lb

## 2018-10-23 DIAGNOSIS — E46 Unspecified protein-calorie malnutrition: Secondary | ICD-10-CM | POA: Diagnosis not present

## 2018-10-23 DIAGNOSIS — Z79899 Other long term (current) drug therapy: Secondary | ICD-10-CM | POA: Diagnosis not present

## 2018-10-23 DIAGNOSIS — I2699 Other pulmonary embolism without acute cor pulmonale: Secondary | ICD-10-CM

## 2018-10-23 DIAGNOSIS — E119 Type 2 diabetes mellitus without complications: Secondary | ICD-10-CM | POA: Insufficient documentation

## 2018-10-23 DIAGNOSIS — Z7901 Long term (current) use of anticoagulants: Secondary | ICD-10-CM | POA: Diagnosis not present

## 2018-10-23 DIAGNOSIS — C099 Malignant neoplasm of tonsil, unspecified: Secondary | ICD-10-CM | POA: Diagnosis not present

## 2018-10-23 DIAGNOSIS — Z95828 Presence of other vascular implants and grafts: Secondary | ICD-10-CM

## 2018-10-23 DIAGNOSIS — C01 Malignant neoplasm of base of tongue: Secondary | ICD-10-CM | POA: Insufficient documentation

## 2018-10-23 DIAGNOSIS — I82403 Acute embolism and thrombosis of unspecified deep veins of lower extremity, bilateral: Secondary | ICD-10-CM

## 2018-10-23 DIAGNOSIS — Z794 Long term (current) use of insulin: Secondary | ICD-10-CM

## 2018-10-23 DIAGNOSIS — Z9221 Personal history of antineoplastic chemotherapy: Secondary | ICD-10-CM | POA: Insufficient documentation

## 2018-10-23 DIAGNOSIS — Z452 Encounter for adjustment and management of vascular access device: Secondary | ICD-10-CM | POA: Insufficient documentation

## 2018-10-23 DIAGNOSIS — K1231 Oral mucositis (ulcerative) due to antineoplastic therapy: Secondary | ICD-10-CM

## 2018-10-23 LAB — CMP (CANCER CENTER ONLY)
ALT: 22 U/L (ref 0–44)
AST: 14 U/L — ABNORMAL LOW (ref 15–41)
Albumin: 4.4 g/dL (ref 3.5–5.0)
Alkaline Phosphatase: 75 U/L (ref 38–126)
Anion gap: 8 (ref 5–15)
BUN: 22 mg/dL — ABNORMAL HIGH (ref 6–20)
CO2: 29 mmol/L (ref 22–32)
Calcium: 9.2 mg/dL (ref 8.9–10.3)
Chloride: 102 mmol/L (ref 98–111)
Creatinine: 0.72 mg/dL (ref 0.61–1.24)
GFR, Est AFR Am: >60 mL/min (ref >60)
GFR, Estimated: >60 mL/min (ref >60)
Glucose, Bld: 125 mg/dL — ABNORMAL HIGH (ref 70–99)
Potassium: 3.6 mmol/L (ref 3.5–5.1)
Sodium: 139 mmol/L (ref 135–145)
Total Bilirubin: 0.3 mg/dL (ref 0.3–1.2)
Total Protein: 6.5 g/dL (ref 6.5–8.1)

## 2018-10-23 LAB — CBC WITH DIFFERENTIAL (CANCER CENTER ONLY)
Abs Immature Granulocytes: 0.02 10*3/uL (ref 0.00–0.07)
Basophils Absolute: 0 10*3/uL (ref 0.0–0.1)
Basophils Relative: 0 %
Eosinophils Absolute: 0 10*3/uL (ref 0.0–0.5)
Eosinophils Relative: 1 %
HCT: 42.6 % (ref 39.0–52.0)
Hemoglobin: 13.9 g/dL (ref 13.0–17.0)
Immature Granulocytes: 0 %
Lymphocytes Relative: 8 %
Lymphs Abs: 0.5 10*3/uL — ABNORMAL LOW (ref 0.7–4.0)
MCH: 33.8 pg (ref 26.0–34.0)
MCHC: 32.6 g/dL (ref 30.0–36.0)
MCV: 103.6 fL — ABNORMAL HIGH (ref 80.0–100.0)
Monocytes Absolute: 0.7 10*3/uL (ref 0.1–1.0)
Monocytes Relative: 12 %
Neutro Abs: 4.3 10*3/uL (ref 1.7–7.7)
Neutrophils Relative %: 79 %
Platelet Count: 236 10*3/uL (ref 150–400)
RBC: 4.11 MIL/uL — ABNORMAL LOW (ref 4.22–5.81)
RDW: 13.3 % (ref 11.5–15.5)
WBC Count: 5.4 10*3/uL (ref 4.0–10.5)
nRBC: 0 % (ref 0.0–0.2)

## 2018-10-23 LAB — MAGNESIUM: Magnesium: 2.2 mg/dL (ref 1.7–2.4)

## 2018-10-23 MED ORDER — SUCRALFATE 1 GM/10ML PO SUSP: 1.0000 g | Freq: Three times a day (TID) | ORAL | 2 refills | Status: DC

## 2018-10-23 NOTE — Progress Notes (Addendum)
[facility] OFFICE PROGRESS NOTE  Patient Care Team: [name], MD as PCP - General (Family Medicine) Melissa Montane, MD as Consulting Physician (Otolaryngology) Eppie Gibson, MD as Attending Physician (Radiation Oncology) Tish Men, MD as Consulting Physician (Hematology) Leota Sauers, RN as Oncology Nurse Navigator [place], [place], [place] as Speech Language Pathologist (Speech Pathology) Karie Mainland, RD as Dietitian (Nutrition) Wynelle Beckmann, Melodie Bouillon, PT as Physical Therapist (Physical Therapy) Kennith Center, LCSW as Social Worker Francina Ames, MD as Referring Physician (Otolaryngology)  HEME/ONC OVERVIEW: 1. Stage I (pT1pN1M0) left tonsil SCCa, p16+ -02/2018: CT neck showed enlarged left Level II and II LN with central necrosis (largest 2.4cm); effacement of the left IJ vein noted  -03/2018: US-guided bx of left cervical LN; path showed SCCa, p16+ -04/2018: PET showed FDG-avid left palatine tonsil lesion measuring ~1.3cm with associated FDG-avid left Level IIa LN's; no evidence of metastatic disease -05/2018: TORS by Dr. Raphael Gibney at [facility]; path showed HPV-positive SCCa of the left glossotonsillar sulcus, 1.6cm, focal margin positive, PNI+, 1/6 LN's positive (largest 6.8cm) with ECE; pT1pN1  -06/2018 - 08/2018: adjuvant chemoradiation with weekly cisplatin -10/2018: NED on CT neck and chest   2. Bilateral PTE and LLE DVT -08/2018: admitted for syncope; CTA showed bilateral PTE w/ RV strain s/p tPA; doppler positive for acute DVT involving left popliteal, posterior tibial and peroneal veins  -08/2018 - present: Eliquis   3. Port and PEG placement in 06/2018   TREATMENT SUMMARY:  05/26/2018: TORS by Dr. Nicolette Bang at [facility]   07/07/2018 - 08/21/2018: adjuvant chemoradiation with weekly cisplatin 91m/m2 x 6 doses  08/27/2018 - present: Eliquis 551mBID   ASSESSMENT & PLAN:   Stage I (cT1N1M0) left tonsil SCCa, p16+ -S/p TORS followed by  adjuvant chemoRT, completed in 08/2018 -I independently reviewed the radiologic images of CT neck and chest, and agreed with the findings as documented -In summary, CT neck and chest showed post-surgical changes in the neck without any evidence of disease recurrence; there was no metastatic disease in the chest  -We will review the patient's imaging results at the tumor board -In the absence of any definitive disease recurrence, there is no role for routine imaging surveillance -[facility] fiber[facility] evaluation   Bilateral PTE and LLE DVT -S/p tPA for RV strain -Currently on Eliquis 13m27mID since 08/2018 and tolerating it well without any abnormal bleeding or bruising -Goal of anticoagulation is lifelong  -I reinforced the importance of preventive strategies such as avoiding hormonal supplement, avoiding cigarette smoking, keeping up-to-date with screening programs for early cancer detection, frequent ambulation for long distance travel and aggressive DVT prophylaxis in all surgical settings. -Should he need any interruption of the anticoagulation for elective procedures in the future, feel free to contact me regarding peri-operative management.  Delayed chemotherapy-associated mucositis -Patient recently completed a 3-week course of fluconazole for oral candidiasis; despite resolution of the candidiasis, he still has pain with swallowing -I recommended the patient a trial of viscous lidocaine swish and swallow, and prescribed sucralfate  -Fiberoptic evaluation with Dr. WalNat Christenxt week -If the odynophagia persists despite viscous lidocaine and sucralfate, we can consider a trial of PPI to see if this is related to acid reflux  Port-a-cath in place -Given NED on CT, I have ordered port removal  -However, due to the large PTE, I would advise the port removal to be scheduled in early July 2020,  so that he would have completed at  least 3 months of therapeutic anticoagulation before holding it for port removal   Protein malnutrition -Secondary to chemoradiation -Weight stable since the last visit  -He is currently taking ~5 cans of Osmolite and Ensure  -I encouraged the patient to resume PO intake as tolerated, and once he is able to achieve adequate oral intake, we can remove the feeding tube    Type II DM  -Currently on Lantus qhs and sliding scale lispro -Glucose improving  -I encouraged the patient to follow-up with PCP   Orders Placed This Encounter  Procedures  . IR REMOVAL TUN ACCESS W/ PORT W/O FL MOD SED    Standing Status:   Future    Standing Expiration Date:   12/23/2019    Order Specific Question:   Reason for exam:    Answer:   Completed chemoRT, remove port    Order Specific Question:   Preferred Imaging Location?    Answer:   [facility]  . CBC with Differential ([place] Only)    Standing Status:   Future    Standing Expiration Date:   11/27/2019  . CMP ([place] only)    Standing Status:   Future    Standing Expiration Date:   11/27/2019  . TSH    Standing Status:   Future    Standing Expiration Date:   10/23/2019  . T4, free    Standing Status:   Future    Standing Expiration Date:   10/23/2019    All questions were answered. The patient knows to call the clinic with any problems, questions or concerns. No barriers to learning was detected.  A total of more than 25 minutes were spent face-to-face with the patient during this encounter and over half of that time was spent on counseling and coordination of care as outlined above.   Return in 2 months for labs and clinic follow-up.  Tish Men, MD 10/23/2018 11:58 AM  CHIEF COMPLAINT: "I am still having pain with swallowing"  INTERVAL HISTORY: Mr. Zeitlin returns to clinic for follow-up of squamous cell carcinoma of the left tonsil.  Patient reports that he had been able to eat and drink relatively well until about 3 weeks  ago, when he developed an episode of thrush, for which he was prescribed 3 weeks of fluconazole by Dr. Isidore Moos.  While the white plaques have resolved, he continues to have pain with swallowing, including both solids and liquids.  He is primarily drinking milk, and has gone back to using his feeding tube (approximately 5-1/2 bottles of Osmolite combined with Ensure).  He is taking IR morphine tabs as well as Tylenol periodically, which seems to control the pain.  His bilateral tinnitus is overall improving.  He is scheduled to see Dr. Nat Christen of ENT next Wednesday.  SUMMARY OF ONCOLOGIC HISTORY:   Squamous cell carcinoma of left tonsil ([place])   03/20/2018 Procedure    FNA of the left cervical LN in office    03/20/2018 Pathology Results    Non-diagnostic    04/07/2018 Procedure    US-guided left cervical LN biopsy/FNA     04/07/2018 Pathology Results    (Accession: [id number]) Lymph node, needle/core biopsy, left cervical - METASTATIC SQUAMOUS CELL CARCINOMA INVOLVING A LYMPH NODE (1/1) - SEE COMMENT  p16 IHC positive. EBV ISH negative.     04/22/2018 Pathology Results    (Accession: [id number]) Tonsil, biopsy, left, base of tongue - INVASIVE KERATINIZING SQUAMOUS  CELL CARCINOMA.    04/29/2018 Imaging    PET:  IMPRESSION: 1. Left palatine tonsil/adjacent pharyngeal mucosal space lesion is asymmetric, measures about 1.3 cm in long axis, and has a maximum SUV of 8.2, compatible with malignancy. There is associated hypermetabolic left level IIa adenopathy as shown on prior exams. 2. Symmetric accentuated activity in the lymphoid tissue at the tongue base, maximum SUV 5.3, quite likely physiologic given the symmetry.  3. No findings of metastatic disease Pred to the chest, abdomen/pelvis, or skeleton.    05/26/2018 Surgery    Radical tonsillectomy    05/26/2018 Pathology Results    PROCEDURE: Radical tonsillectomy TUMOR SITE: Glossotonsillar sulcus TUMOR LATERALITY: LEFT TUMOR  FOCALITY: Unifocal TUMOR SIZE: GREATEST DIMENSION: 1.6 cm HISTOLOGIC TYPE: HPV-mediated squamous cell carcinoma MARGINS: Involved by invasive tumor (focal) Specify location of closest margin: Tongue base (soft tissue) Note: Although an additional tongue base mucosal margin was submitted, location of tumor extension to margin appears 1 cm from the mucosal surface. LYMPHOVASCULAR INVASION: Not identified PERINEURAL INVASION: Present REGIONAL LYMPH NODES:  NUMBER OF LYMPH NODES INVOLVED: 1  NUMBER OF LYMPH NODES EXAMINED: 6  LATERALITY OF LYMPH NODES INVOLVED: Ipsilateral  SIZE OF LARGEST METASTATIC DEPOSIT: 6.8 cm  EXTRANODAL EXTENSION: Present   Distance from lymph node capsule: 6 millimeters PATHOLOGIC STAGE CLASSIFICATION (pTNM, AJCC 8TH Ed): pT1 pN1 (HPV-mediated oropharynx tumor)  pT1: Tumor 2 cm or smaller in greatest dimension  pN1: Metastasis in 4 or fewer lymph nodes ANCILLARY STUDIES:  p16 expression by immunohistochemistry: Positive  HPV by PCR: pending    05/26/2018 Cancer Staging    Staging form: Pharynx - HPV-Mediated Oropharynx, AJCC 8th Edition - Pathologic stage from 05/26/2018: Stage I (pT1, pN1, cM0, p16+) - Signed by Tish Men, MD on 06/18/2018    07/10/2018 - 07/10/2018 Chemotherapy    The patient had palonosetron (ALOXI) injection 0.25 mg, 0.25 mg, Intravenous,  Once, 0 of 3 cycles CISplatin (PLATINOL) 198 mg in sodium chloride 0.9 % 500 mL chemo infusion, 100 mg/m2 = 198 mg, Intravenous,  Once, 0 of 3 cycles fosaprepitant (EMEND) 150 mg, dexamethasone (DECADRON) 12 mg in sodium chloride 0.9 % 145 mL IVPB, , Intravenous,  Once, 0 of 3 cycles  for chemotherapy treatment.     07/10/2018 -  Chemotherapy    The patient had palonosetron (ALOXI) injection 0.25 mg, 0.25 mg, Intravenous,  Once, 6 of 6 cycles Administration: 0.25 mg (07/10/2018), 0.25 mg (07/18/2018), 0.25 mg (07/24/2018), 0.25 mg (07/31/2018), 0.25 mg (08/07/2018), 0.25 mg  (08/14/2018) CISplatin (PLATINOL) 79 mg in sodium chloride 0.9 % 250 mL chemo infusion, 40 mg/m2 = 79 mg, Intravenous,  Once, 6 of 6 cycles Administration: 79 mg (07/10/2018), 79 mg (07/18/2018), 79 mg (07/24/2018), 79 mg (07/31/2018), 79 mg (08/07/2018), 79 mg (08/14/2018) fosaprepitant (EMEND) 150 mg, dexamethasone (DECADRON) 12 mg in sodium chloride 0.9 % 145 mL IVPB, , Intravenous,  Once, 6 of 6 cycles Administration:  (07/10/2018),  (07/18/2018),  (07/24/2018),  (07/31/2018),  (08/07/2018),  (08/14/2018)  for chemotherapy treatment.     10/20/2018 Imaging    CT neck w/ contrast: IMPRESSION: Interval left tonsillectomy and neck dissection. No evidence of residual mass in the left tonsillar region. Postsurgical distortion of the left neck, without evidence of residual or recurrent tumor.  I would categorize this as NI RADS 1 based on the CT, but this could be altered based on follow-up PET.    10/20/2018 Imaging    CT chest w/ contrast: IMPRESSION: No evidence of metastatic disease  in the chest.     Malignant neoplasm of base of tongue ([place])   05/09/2018 Initial Diagnosis    Malignant neoplasm of base of tongue ([place])    05/09/2018 Cancer Staging    Staging form: Pharynx - HPV-Mediated Oropharynx, AJCC 8th Edition - Clinical: Stage I (cT1, cN1, cM0, p16+) - Signed by Eppie Gibson, MD on 05/09/2018    06/11/2018 Cancer Staging    Staging form: Pharynx - HPV-Mediated Oropharynx, AJCC 8th Edition - Pathologic: Stage I (pT1, pN1, cM0, p16+) - Signed by Eppie Gibson, MD on 06/11/2018     REVIEW OF SYSTEMS:   Constitutional: ( - ) fevers, ( - )  chills , ( - ) night sweats Eyes: ( - ) blurriness of vision, ( - ) double vision, ( - ) watery eyes Ears, nose, mouth, throat, and face: ( + ) mucositis, ( + ) sore throat Respiratory: ( - ) cough, ( - ) dyspnea, ( - ) wheezes Cardiovascular: ( - ) palpitation, ( - ) chest discomfort, ( - ) lower extremity swelling Gastrointestinal:  ( - ) nausea, ( - )  heartburn, ( - ) change in bowel habits Skin: ( - ) abnormal skin rashes Lymphatics: ( - ) new lymphadenopathy, ( - ) easy bruising Neurological: ( - ) numbness, ( - ) tingling, ( - ) new weaknesses Behavioral/Psych: ( - ) mood change, ( - ) new changes  All other systems were reviewed with the patient and are negative.  I have reviewed the past medical history, past surgical history, social history and family history with the patient and they are unchanged from previous note.  ALLERGIES:  has No Known Allergies.  MEDICATIONS:  Current Outpatient Medications  Medication Sig Dispense Refill  . apixaban (ELIQUIS) 5 MG TABS tablet 76m po bid for 7 days then 550mpo bid , for 30days supply. 60 tablet 0  . bisacodyl (DULCOLAX) 5 MG EC tablet Take 5 mg by mouth 2 (two) times daily.    . blood glucose meter kit and supplies KIT Dispense based on patient and insurance preference. Use up to four times daily as directed. (FOR ICD-9 250.00, 250.01). 1 each 0  . insulin lispro (HUMALOG KWIKPEN) 100 UNIT/ML KwikPen Before each meal 3 times a day 140-199 - 2 units; 200-250  4units; 251-299  6 units;  300-349 - 8 units; 350 or above 10 units. 15 mL 0  . Insulin Pen Needle (PEN NEEDLES 3/16") 31G X 5 MM MISC For insulin injection. 100 each 0  . lidocaine (XYLOCAINE) 2 % solution Patient: Mix 1part 2% viscous lidocaine, 1part H20. Swish & swallow 1062mf diluted mixture, 30m29mefore meals and at bedtime, up to QID (Patient taking differently: Use as directed 10 mLs in the mouth or throat 4 (four) times daily as needed for mouth pain. Mix 1 part 2% viscous lidocaine, 1 part H20. Swish & swallow 10 mL of diluted mixture, 30 min before meals and at bedtime, up to QID) 100 mL 5  . LORazepam (ATIVAN) 0.5 MG tablet Take 1 tablet (0.5 mg total) by mouth every 6 (six) hours as needed (Nausea or vomiting). 30 tablet 0  . metFORMIN (GLUCOPHAGE) 500 MG tablet Take 1 tablet (500 mg total) by mouth 2 (two) times daily with  a meal. 60 tablet 0  . morphine (MSIR) 15 MG tablet Take 1 tablet (15 mg total) by mouth every 4 (four) hours as needed for severe pain. 60 tablet 0  . Neomycin-Bacitracin-Polymyxin (  NEOSPORIN EX) Apply 1 application topically daily as needed (neck wound).    . Nutritional Supplements (FEEDING SUPPLEMENT, OSMOLITE 1.5 CAL,) LIQD Give 1.5 bottles Osmolite 1.5 Bid via PEG and 2 bottles Osmolite 1.5 via PEG for total of 7 bottles daily.  Give 60 cc free water before and after bolus feedings 4 times daily.  In addition consume or give via PEG 240 mL free water 3 times daily.  Please send formula and split gauze to patient.  He does not need other supplies at this time. (Patient taking differently: Take 355.5-474 mLs by mouth See admin instructions. Take 474 ml in the morning, 474 ml at lunch, 355.5 ml at dinner and 355.5 ml at bedtime via PEG for total of 7 bottles daily. Give 60 cc free water before and after bolus feedings 4 times daily.  In addition consume or give via PEG 240 mL free water 3 times daily.  Please send formula and split gauze to patient) 7 Bottle 0  . Nutritional Supplements (PROMOD) LIQD Add 30 mL Promod TID. Continue 7 bottles Osmolite 1.5 daily. (Patient taking differently: Take 30 mLs by mouth 3 (three) times daily. ) 2 Bottle 1  . ondansetron (ZOFRAN) 8 MG tablet Take 1 tablet (8 mg total) by mouth 2 (two) times daily as needed. Start on the third day after chemotherapy. (Patient taking differently: Take 8 mg by mouth 2 (two) times daily as needed for nausea or vomiting. Start on the third day after chemotherapy.) 30 tablet 1  . pramipexole (MIRAPEX) 1 MG tablet Take 1 mg by mouth at bedtime.    . silver sulfADIAZINE (SILVADENE) 1 % cream Apply 1 application topically 2 (two) times daily.    . sodium fluoride (PREVIDENT 5000 PLUS) 1.1 % CREA dental cream Place 1 application onto teeth at bedtime. Apply to tooth brush. Brush teeth for 2 minutes. Spit out excess. DO NOT rinse afterwards.  Repeat nightly.    . Wound Dressings (SONAFINE EX) Apply 1 application topically daily as needed (neck wound).    . insulin glargine (LANTUS) 100 unit/mL SOPN Inject 0.18 mLs (18 Units total) into the skin at bedtime for 30 days. 15 mL 0  . sucralfate (CARAFATE) 1 GM/10ML suspension Take 10 mLs (1 g total) by mouth 4 (four) times daily -  with meals and at bedtime for 30 days. 420 mL 2   No current facility-administered medications for this visit.    Facility-Administered Medications Ordered in Other Visits  Medication Dose Route Frequency Provider Last Rate Last Dose  . heparin lock flush 100 unit/mL  500 Units Intravenous Once Ennever, Rudell Cobb, MD      . sodium chloride flush (NS) 0.9 % injection 10 mL  10 mL Intravenous PRN Ennever, Rudell Cobb, MD        PHYSICAL EXAMINATION: ECOG PERFORMANCE STATUS: 1 - Symptomatic but completely ambulatory  Today's Vitals   10/23/18 1136  BP: 139/79  Pulse: 89  Resp: 16  Temp: 98.8 F (37.1 C)  TempSrc: Oral  SpO2: 97%  Weight: 178 lb 1.3 oz (80.8 kg)  Height: _0  (1.727 m)  PainSc: 8    Body mass index is 27.08 kg/m.  Filed Weights   10/23/18 1136  Weight: 178 lb 1.3 oz (80.8 kg)    GENERAL: alert, no distress and comfortable SKIN: skin color, texture, turgor are normal, no rashes or significant lesions EYES: conjunctiva are pink and non-injected, sclera clear OROPHARYNX: no exudate, no erythema; lips, buccal mucosa, and  tongue normal; no evidence of oral candidiasis  NECK: supple, non-tender LYMPH:  no palpable lymphadenopathy in the cervical LUNGS: clear to auscultation with normal breathing effort HEART: regular rate & rhythm and no murmurs and no lower extremity edema ABDOMEN: soft, non-tender, non-distended, normal bowel sounds Musculoskeletal: no cyanosis of digits and no clubbing  PSYCH: alert & oriented x 3, fluent speech NEURO: no focal motor/sensory deficits  LABORATORY DATA:  I have reviewed the data as listed     Component Value Date/Time   NA 139 10/23/2018 1010   K 3.6 10/23/2018 1010   CL 102 10/23/2018 1010   CO2 29 10/23/2018 1010   GLUCOSE 125 (H) 10/23/2018 1010   BUN 22 (H) 10/23/2018 1010   CREATININE 0.72 10/23/2018 1010   CALCIUM 9.2 10/23/2018 1010   PROT 6.5 10/23/2018 1010   ALBUMIN 4.4 10/23/2018 1010   AST 14 (L) 10/23/2018 1010   ALT 22 10/23/2018 1010   ALKPHOS 75 10/23/2018 1010   BILITOT 0.3 10/23/2018 1010   GFRNONAA >60 10/23/2018 1010   GFRAA >60 10/23/2018 1010    No results found for: SPEP, UPEP  Lab Results  Component Value Date   WBC 5.4 10/23/2018   NEUTROABS 4.3 10/23/2018   HGB 13.9 10/23/2018   HCT 42.6 10/23/2018   MCV 103.6 (H) 10/23/2018   PLT 236 10/23/2018      Chemistry      Component Value Date/Time   NA 139 10/23/2018 1010   K 3.6 10/23/2018 1010   CL 102 10/23/2018 1010   CO2 29 10/23/2018 1010   BUN 22 (H) 10/23/2018 1010   CREATININE 0.72 10/23/2018 1010      Component Value Date/Time   CALCIUM 9.2 10/23/2018 1010   ALKPHOS 75 10/23/2018 1010   AST 14 (L) 10/23/2018 1010   ALT 22 10/23/2018 1010   BILITOT 0.3 10/23/2018 1010       RADIOGRAPHIC STUDIES: I have personally reviewed the radiological images as listed below and agreed with the findings in the report. Ct Soft Tissue Neck W Contrast  Result Date: 10/20/2018 CLINICAL DATA:  Followup squamous cell carcinoma of the left tonsil with neck dissection, chemotherapy. EXAM: CT NECK WITH CONTRAST TECHNIQUE: Multidetector CT imaging of the neck was performed using the standard protocol following the bolus administration of intravenous contrast. CONTRAST:  176m OMNIPAQUE IOHEXOL 300 MG/ML  SOLN COMPARISON:  03/05/2018 CT.  04/29/2018 PET scan. FINDINGS: Pharynx and larynx: There is no longer any identifiable mass in the region of the left tonsil. No other mucosal or submucosal lesion visible. The glottis is held in voluntary closure. Salivary glands: Parotid glands are normal. I  think the left submandibular gland has been removed. Right submandibular gland is normal. Thyroid: Normal Lymph nodes: Interval neck dissection on the left. Scarring related to that. I do not identify any residual or recurrent adenopathy. Right neck nodes are normal. Vascular: No acute or significant vascular finding. Left jugular vein resected. Limited intracranial: Normal Visualized orbits: Normal Mastoids and visualized paranasal sinuses: Few opacified air cells at the left mastoid tip. No destructive change. Mastoids otherwise clear. Sinuses are clear. Skeleton: Ordinary degenerative spondylosis and facet arthropathy. Upper chest: Mild pleural and parenchymal scarring at the apices. Other: None IMPRESSION: Interval left tonsillectomy and neck dissection. No evidence of residual mass in the left tonsillar region. Postsurgical distortion of the left neck, without evidence of residual or recurrent tumor. I would categorize this as NI RADS 1 based on the CT, but this  could be altered based on follow-up PET. Electronically Signed   By: Nelson Chimes M.D.   On: 10/20/2018 09:30   Ct Chest W Contrast  Result Date: 10/20/2018 CLINICAL DATA:  Squamous cell left tonsil cancer status post chemotherapy. Restaging. EXAM: CT CHEST WITH CONTRAST TECHNIQUE: Multidetector CT imaging of the chest was performed during intravenous contrast administration. CONTRAST:  165m OMNIPAQUE IOHEXOL 300 MG/ML  SOLN COMPARISON:  04/29/2018 PET-CT. 08/22/2018 chest CT angiogram. 08/23/2018 chest radiograph. FINDINGS: Cardiovascular: Normal heart size. No significant pericardial effusion/thickening. Right internal jugular Port-A-Cath terminates at the cavoatrial junction. Great vessels are normal in course and caliber. No central pulmonary emboli. Mediastinum/Nodes: No discrete thyroid nodules. Unremarkable esophagus. No pathologically enlarged axillary, mediastinal or hilar lymph nodes. Lungs/Pleura: No pneumothorax. No pleural effusion. No  acute consolidative airspace disease, lung masses or significant pulmonary nodules. Upper abdomen: Subcentimeter hypodense left liver lobe lesion is too small to characterize and is unchanged, probably benign. Stable partially visualized cystic lesion in upper left renal sinus, incompletely characterized, probably a parapelvic renal cyst. Percutaneous gastrostomy tube is well positioned in the body of the stomach anteriorly. Musculoskeletal:  No aggressive appearing focal osseous lesions. IMPRESSION: No evidence of metastatic disease in the chest. Electronically Signed   By: JIlona SorrelM.D.   On: 10/20/2018 09:27

## 2018-10-23 NOTE — Patient Instructions (Signed)

## 2018-10-24 ENCOUNTER — Inpatient Hospital Stay: Payer: Commercial Managed Care - PPO | Admitting: Nutrition

## 2018-10-24 NOTE — Progress Notes (Signed)
RD working remotely.  Nutrition follow up completed with patient on telephone.  He is s/p treatment for Tonsil cancer. Patient reports he does not really feel better than the last time he and I talked. He was diagnosed and treated for Thrush. He is experiencing odynophagia.  He was given lidocaine and sucralfate. Glucose is improving and was documented as 125. Weight is stable at 178 pounds, documented on June 4. He has been using Osmolite 1.5 and Ensure Enlive through his tube.  Since his throat pain increased, he has stopped advancing diet and reverted back to TF.  Nutrition Diagnosis: Inadequate oral intake continues.  Intervention: Educated patient to continue to try soft foods and liquids frequently throughout the day. Try to supplement with oral nutrition supplements. Wean off Osmolite 1.5 as oral intake increases.  Monitoring, Evaluation, Goals: Patient will increase oral intake and decrease TF while maintaining weight or having minimal weight loss.  Next Visit: Patient will contact me for follow up.

## 2018-10-31 ENCOUNTER — Telehealth: Payer: Self-pay | Admitting: *Deleted

## 2018-10-31 NOTE — Telephone Encounter (Signed)
Spoke to patient to confirm upcoming appointment with Glendell Docker on Monday, patient advised cell number would be a good number to reach him

## 2018-11-03 ENCOUNTER — Ambulatory Visit: Payer: Commercial Managed Care - PPO | Attending: Family Medicine

## 2018-11-03 DIAGNOSIS — L599 Disorder of the skin and subcutaneous tissue related to radiation, unspecified: Secondary | ICD-10-CM | POA: Insufficient documentation

## 2018-11-03 DIAGNOSIS — R1310 Dysphagia, unspecified: Secondary | ICD-10-CM | POA: Insufficient documentation

## 2018-11-03 DIAGNOSIS — Z483 Aftercare following surgery for neoplasm: Secondary | ICD-10-CM | POA: Insufficient documentation

## 2018-11-03 DIAGNOSIS — R293 Abnormal posture: Secondary | ICD-10-CM | POA: Insufficient documentation

## 2018-11-03 DIAGNOSIS — M6281 Muscle weakness (generalized): Secondary | ICD-10-CM | POA: Insufficient documentation

## 2018-11-03 DIAGNOSIS — M436 Torticollis: Secondary | ICD-10-CM | POA: Insufficient documentation

## 2018-11-03 NOTE — Therapy (Signed)
[facility] [address] [place], [place], [address] Phone: [phone]   Fax:  [phone]  Speech Language Pathology Treatment  Patient Details  Name: Glenn Goodman MRN: [id number] Date of Birth: 11-May-1961 Referring Provider (SLP): Eppie Gibson, MD   Encounter Date: 11/03/2018  End of Session - 11/03/18 1448    Visit Number  5    Number of Visits  7    Date for SLP Re-Evaluation  12/16/18    SLP Start Time  1406    SLP Stop Time   1445    SLP Time Calculation (min)  39 min    Activity Tolerance  Patient tolerated treatment well       Past Medical History:  Diagnosis Date  . Arthritis   . Cancer ([place])   . OSA on CPAP   . Restless leg syndrome     Past Surgical History:  Procedure Laterality Date  . DIRECT LARYNGOSCOPY Left 04/22/2018   Dr. Janace Hoard  . FACIAL FRACTURE SURGERY Left 1986  . IR ANGIOGRAM PULMONARY BILATERAL SELECTIVE  08/22/2018  . IR ANGIOGRAM SELECTIVE EACH ADDITIONAL VESSEL  08/22/2018  . IR ANGIOGRAM SELECTIVE EACH ADDITIONAL VESSEL  08/22/2018  . IR GASTROSTOMY TUBE MOD SED  06/30/2018  . IR IMAGING GUIDED PORT INSERTION  06/30/2018  . IR INFUSION THROMBOL ARTERIAL INITIAL (MS)  08/22/2018  . IR INFUSION THROMBOL ARTERIAL INITIAL (MS)  08/22/2018  . IR THROMB F/U EVAL ART/VEN FINAL DAY (MS)  08/23/2018  . IR US GUIDE VASC ACCESS RIGHT  08/22/2018  . MODIFIED RADICAL NECK DISSECTION Left 05/26/2018   Dr. Nicolette Bang, [facility]  . transoral robotic surgery  05/26/2018   [facility], Dr. Nicolette Bang [facility]    There were no vitals filed for this visit.  Subjective Assessment - 11/03/18 1409    Subjective  Pt with dificulty with super supraglottic due to pain.            ADULT SLP TREATMENT - 11/03/18 1411      General Information   Behavior/Cognition  Alert;Cooperative;Pleasant mood      Treatment Provided   Treatment provided  Dysphagia      Dysphagia Treatment   Temperature Spikes Noted  No    Respiratory Status  Room air    Oral Cavity - Dentition  Adequate natural dentition    Treatment Methods  Patient/caregiver education    Other treatment/comments  Originally scheduled for Webex however pt email was reportedly not present in pt's inbox since last visit. Pt preferred a telephone call instead of video visit today but would like follow ups via Webex. SLP acknowledged this. Pt now prescribed Carafate. Pt with oral thrush - Waltonen suggested Activia oral hold - pt has been doing this since his appt with Waltonen. Healios (OTC) supplement was suggested by Dr. Nicolette Bang. Trying to eat more pureed items and liquids however odyophagia hinders POs, not coughing/choking and other overt s/sx aspiration.  Tries to do exercises with liquid POs however pt has been challenged at this time with SWALLOWING exercises due to pain. SLP affirmed this and encouraged pt to do all the exercises he can.       Pain Assessment   Pain Assessment  0-10    Pain Score  3    when swallowing, 8-9/10   Pain Location  mouth/throat    Pain Descriptors / Indicators  Sore      Assessment / Recommendations / Plan   Plan  Continue with current plan of  care;Other (Comment)   Webex visit next session     Progression Toward Goals   Progression toward goals  Progressing toward goals         SLP Short Term Goals - 10/06/18 1145      SLP SHORT TERM GOAL #1   Title  pt will complete HEP with rare min A over two sessions    Status  Achieved      SLP SHORT TERM GOAL #2   Title  pt will tell SLP why he is completing HEP     Status  Achieved   time period - 2 sessions     SLP SHORT TERM GOAL #3   Title  pt will tell SLP how a food journal can facilitate return to most normal diet    Status  Achieved       SLP Long Term Goals - 11/03/18 1418      SLP LONG TERM GOAL #1   Title  pt will complete HEP with modified independence x3 sessions    Baseline  07-06-18, 10-06-18    Time  1    Period  --   visits (visit  #5)   Status  Revised      SLP LONG TERM GOAL #2   Title  pt will tell SLP 3 overt s/s of aspiration PNA with modified independence    Time  --   visit #5   Status  Achieved      SLP LONG TERM GOAL #3   Title  pt will report to SLP when he can change frequency of HEP to 2-3x/week x2 visits    Baseline  11-03-18    Time  1    Period  --   visits (by visit #6)   Status  On-going      SLP LONG TERM GOAL #4   Title  pt will demo super-supraglottic swallow x2 sessions with modified independence    Time  1    Period  --   visits (by visit #6)   Status  On-going       Plan - 11/03/18 1449    Clinical Impression Statement  Visit took place via telephone, per medical director's directive (Dr. Eppie Gibson) due to COVID-19 pandemic. Pt with oropharyngeal swallowing described as cont'd  difficult but manageable at the current time.  See "other treatment/comments" for more details. Pt does not report overt s/s aspiration PNA today. The probability of swallowing difficulty increases dramatically with the pt history of chemo and radiation therapy. Pt will need to be followed by SLP for regular assessment of accurate HEP completion as well as for safety with POs both during and following treatment/s.    Speech Therapy Frequency  --   once approx every four weeks   Duration  --   6 sessions (7 total)   Treatment/Interventions  Aspiration precaution training;Diet toleration management by SLP;Pharyngeal strengthening exercises;Internal/external aids;Trials of upgraded texture/liquids;Compensatory strategies;Patient/family education;SLP instruction and feedback;Cueing hierarchy;Environmental controls    Potential to Achieve Goals  Good    SLP Home Exercise Plan  provided today    Consulted and Agree with Plan of Care  Patient       Patient will benefit from skilled therapeutic intervention in order to improve the following deficits and impairments:   Dysphagia, unspecified type    Problem  List Patient Active Problem List   Diagnosis Date Noted  . Acute on chronic respiratory failure with hypoxia ([place])   . Saddle embolus  of pulmonary artery ([place])   . Acute pulmonary embolism without acute cor pulmonale (HCC)   . Deep vein thrombosis (DVT) of both lower extremities ([place])   . Leukopenia due to antineoplastic chemotherapy ([place])   . Antineoplastic chemotherapy induced anemia   . Dehydration   . LFT elevation   . Mucositis   . Pancytopenia ([place])   . Hypokalemia   . Hyperglycemia 08/21/2018  . Protein malnutrition ([place]) 07/30/2018  . AKI (acute kidney injury) ([place]) 07/23/2018  . Mucositis due to chemotherapy 07/23/2018  . Chemotherapy-induced nausea 07/17/2018  . Chemotherapy-induced diarrhea 07/17/2018  . Oral candidiasis 07/17/2018  . Port-A-Cath in place 07/09/2018  . Sensorineural hearing loss (SNHL) of both ears 06/18/2018  . Odynophagia 06/18/2018  . Malignant neoplasm of base of tongue ([place]) 05/09/2018  . 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

## 2018-11-04 ENCOUNTER — Encounter: Payer: Commercial Managed Care - PPO | Admitting: Nutrition

## 2018-11-10 ENCOUNTER — Ambulatory Visit: Payer: Commercial Managed Care - PPO

## 2018-11-12 ENCOUNTER — Telehealth: Payer: Self-pay | Admitting: *Deleted

## 2018-11-12 NOTE — Telephone Encounter (Signed)
Received vm message from patient today. He is asking if Dr. Maylon Peppers thought hyper baric 02 treatment would be beneficial to his healing process.  Please advise.

## 2018-11-14 ENCOUNTER — Telehealth: Payer: Self-pay | Admitting: *Deleted

## 2018-11-14 ENCOUNTER — Other Ambulatory Visit: Payer: Self-pay | Admitting: *Deleted

## 2018-11-14 DIAGNOSIS — C01 Malignant neoplasm of base of tongue: Secondary | ICD-10-CM

## 2018-11-14 NOTE — Telephone Encounter (Signed)
Oncology Nurse Navigator Documentation  In follow-up to pt's call yesterday re PT referral for post-RT lymphedema evaluation/treatment, called/[facility] indicating referral has been placed, he should be contacted early next week to schedule appt.  [name], RN, BSN Head & Neck Oncology Nurse [place] [facility] [phone]

## 2018-11-17 ENCOUNTER — Encounter: Payer: Self-pay | Admitting: Physical Therapy

## 2018-11-17 ENCOUNTER — Ambulatory Visit: Payer: Commercial Managed Care - PPO | Admitting: Physical Therapy

## 2018-11-17 ENCOUNTER — Other Ambulatory Visit: Payer: Self-pay

## 2018-11-17 DIAGNOSIS — R293 Abnormal posture: Secondary | ICD-10-CM

## 2018-11-17 DIAGNOSIS — Z483 Aftercare following surgery for neoplasm: Secondary | ICD-10-CM | POA: Diagnosis present

## 2018-11-17 DIAGNOSIS — L599 Disorder of the skin and subcutaneous tissue related to radiation, unspecified: Secondary | ICD-10-CM

## 2018-11-17 DIAGNOSIS — M436 Torticollis: Secondary | ICD-10-CM

## 2018-11-17 DIAGNOSIS — M6281 Muscle weakness (generalized): Secondary | ICD-10-CM | POA: Diagnosis present

## 2018-11-17 DIAGNOSIS — R1310 Dysphagia, unspecified: Secondary | ICD-10-CM | POA: Diagnosis present

## 2018-11-17 NOTE — Therapy (Signed)
[facility] [place], [place], [address] Phone: [phone]   Fax:  [phone]  Physical Therapy Evaluation  Patient Details  Name: Glenn Goodman MRN: [id number] Date of Birth: 1961/04/15 Referring Provider (PT): Reita May Date: 11/17/2018  PT End of Session - 11/17/18 1901    Visit Number  1    Number of Visits  9    Date for PT Re-Evaluation  12/17/18    PT Start Time  1430    PT Stop Time  1515    PT Time Calculation (min)  45 min    Activity Tolerance  Patient tolerated treatment well    Behavior During Therapy  [facility] for tasks assessed/performed       Past Medical History:  Diagnosis Date  . Arthritis   . Cancer ([place])   . OSA on CPAP   . Restless leg syndrome     Past Surgical History:  Procedure Laterality Date  . DIRECT LARYNGOSCOPY Left 04/22/2018   Dr. Janace Hoard  . FACIAL FRACTURE SURGERY Left 1986  . IR ANGIOGRAM PULMONARY BILATERAL SELECTIVE  08/22/2018  . IR ANGIOGRAM SELECTIVE EACH ADDITIONAL VESSEL  08/22/2018  . IR ANGIOGRAM SELECTIVE EACH ADDITIONAL VESSEL  08/22/2018  . IR GASTROSTOMY TUBE MOD SED  06/30/2018  . IR IMAGING GUIDED PORT INSERTION  06/30/2018  . IR INFUSION THROMBOL ARTERIAL INITIAL (MS)  08/22/2018  . IR INFUSION THROMBOL ARTERIAL INITIAL (MS)  08/22/2018  . IR THROMB F/U EVAL ART/VEN FINAL DAY (MS)  08/23/2018  . IR US GUIDE VASC ACCESS RIGHT  08/22/2018  . MODIFIED RADICAL NECK DISSECTION Left 05/26/2018   Dr. Nicolette Bang, [facility]  . transoral robotic surgery  05/26/2018   [facility], Dr. Nicolette Bang [facility]    There were no vitals filed for this visit.   Subjective Assessment - 11/17/18 1436    Subjective  "One of the reasons I am here is to get rid of my "[place] neck""   He is also having a lot of tightness in the front of his neck around the scar    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 diagnosed on Halloween , TORS  ( surgery in the mouth and  thoat with removal of lymph nodes and a section of the back of throat and base of tongue and at same time had external surgery to remove the lymph nodes of the neck 05/26/18, perineural invasion, extranodal extension 1 of 4 L neck nodes, concurrent adjuvant chemo and radiation 06/2018-08/2018 to oropharynx/bilateral neck, no extractions needed, PEG/PAC placement pending, non smoker/drinker In April pt also had hospitalization for Bilateral PTE and LLE DVT and new onset of DM Pt is still using his PEG tube due to severe pain with eating.    Currently in Pain?  Yes    Pain Score  4    8 or 9 with swallowing   Pain Location  Throat    Pain Orientation  Left    Pain Descriptors / Indicators  Aching;Spasm   probably related to spot on his tongue   Pain Type  Chronic pain    Pain Onset  More than a month ago    Pain Frequency  Constant    Aggravating Factors   swallowing    Pain Relieving Factors  sometimes stretching helps a little         [facility] PT Assessment - 11/17/18 0001      Assessment   Medical  Diagnosis  L base of tongue/tonsillar cancer    Referring Provider (PT)  [place]    Onset Date/Surgical Date  05/26/18    Hand Dominance  Right    Prior Therapy  none      Precautions   Precautions  None      Balance Screen   Has the patient fallen in the past 6 months  No    Has the patient had a decrease in activity level because of a fear of falling?   No    Is the patient reluctant to leave their home because of a fear of falling?   No      Home Film/video editor residence    Living Arrangements  Spouse/significant other    Available Help at Discharge  Family    Type of [place]      Prior Function   Level of [facility]  Full time employment    Medical illustrator     Leisure  walking a couple of times a week about 30 minutes       Cognition   Overall Cognitive Status  Within Functional Limits for tasks assessed       Observation/Other Assessments   Observations  well healed scar at left neck with fullness in neck under chin.  Pt has decreased mouth opening and says that prior to surgery he had excessive mouth opening with clicking of his TMJ he not longer has on the left     Skin Integrity  no open areas       Posture/Postural Control   Posture/Postural Control  Postural limitations    Postural Limitations  Rounded Shoulders;Forward head    Posture Comments  anterior abdomen       ROM / Strength   AROM / PROM / Strength  AROM;Strength      AROM   Right Shoulder Flexion  160 Degrees    Left Shoulder Flexion  150 Degrees    Cervical Flexion  50    Cervical Extension  5    Cervical - Right Side Bend  10    Cervical - Left Side Bend  35      Strength   Overall Strength Comments  Pt appears to have generalized deconditioning       Palpation   Palpation comment  very tender tightness in left neck lateral to trachea         LYMPHEDEMA/ONCOLOGY QUESTIONNAIRE - 11/17/18 1505      Head and Neck   4 cm superior to sternal notch around neck  42 cm    6 cm superior to sternal notch around neck  42 cm    8 cm superior to sternal notch around neck  42.7 cm    Other  --   mouth opening 3.2 cm from tip of tooth to tooth             Objective measurements completed on examination: See above findings.      [facility] Adult PT Treatment/Exercise - 11/17/18 0001      Self-Care   Self-Care  Other Self-Care Comments    Other Self-Care Comments   provided lymphedema chip pack for pt wear 2-3 hours a day to see if it will help with symptoms              PT Education - 11/17/18 1900    Education Details  wear chip  pack 2-3 hours per day , continue with neck ROM and mouth opening exercise    Person(s) Educated  Patient    Methods  Explanation    Comprehension  Verbalized understanding          PT Long Term Goals - 11/17/18 1912      PT LONG TERM GOAL #1   Title  Pt will  increase neck extenstion to 25 degrees    Time  4    Period  Weeks    Status  New      PT LONG TERM GOAL #2   Title  Pt wil report neck pain and stiffness is decreased to 2/10    Baseline  4/10    Time  4    Period  Weeks    Status  New      PT LONG TERM GOAL #3   Title  Pt will be independent in self manual lymph drainage for neck and will know how to get a compression garment for self managment    Time  4    Period  Weeks    Status  New      PT LONG TERM GOAL #4   Title  Pt will increase opening of mouth from tip of top tooth to tip of bottom tooth to 4.0 cm    Baseline  3.2 cm    Time  4    Period  Weeks    Status  New      PT LONG TERM GOAL #5   Title  Pt will be independent in a home exercise program for UE strength    Time  4    Period  Weeks    Status  New             Plan - 11/17/18 1902    Clinical Impression Statement  Pt continues to recover from head and neck surgery and chemoradiation that was complicated by bilater PE and LLE DVT and new onset of DM.  Pt reports severe pain with swallowing and antrior left neck muscle tightness and tenderness with decreased neck range of motion and lymphedema in his neck.  He also has decreased mouth opening with tightness in left TMJ    Personal Factors and Comorbidities  Comorbidity 3+    Comorbidities  prvious chemo, surgery, radiation , PE and DVT and new onset DM    Examination-Activity Limitations  Other   eating and swallowing   Stability/Clinical Decision Making  Evolving/Moderate complexity   progressing pain and tightness   Clinical Decision Making  Moderate    Rehab Potential  Good    PT Frequency  2x / week    PT Duration  4 weeks    PT Treatment/Interventions  ADLs/Self Care Home Management;Patient/family education;Therapeutic exercise;Orthotic Fit/Training;Manual lymph drainage;Manual techniques;Scar mobilization;Compression bandaging;Passive range of motion;Taping;Vasopneumatic Device;Joint Manipulations     PT Next Visit Plan  Manual techniques including scar massage, myofascial work to left neck, soft tisssue work to neck, manual lymph drainage. do TMJ work as needed.  Progress to getting a jovi pack and consider a foam addition to left neck, encourage general exercise, scapular and UE work. later consider taping    PT Home Exercise Plan  head and neck stretches    Consulted and Agree with Plan of Care  Patient       Patient will benefit from skilled therapeutic intervention in order to improve the following deficits and impairments:  Decreased knowledge of precautions, Decreased range  of motion, Increased fascial restricitons, Decreased scar mobility, Decreased knowledge of use of DME, Decreased strength, Impaired flexibility, Postural dysfunction, Pain, Impaired perceived functional ability, Increased edema  Visit Diagnosis: 1. Aftercare following surgery for neoplasm   2. Abnormal posture   3. Disorder of the skin and subcutaneous tissue related to radiation, unspecified   4. Muscle weakness (generalized)   5. Stiffness of neck        Problem List Patient Active Problem List   Diagnosis Date Noted  . Acute on chronic respiratory failure with hypoxia ([place])   . Saddle embolus of pulmonary artery ([place])   . Acute pulmonary embolism without acute cor pulmonale (HCC)   . Deep vein thrombosis (DVT) of both lower extremities ([place])   . Leukopenia due to antineoplastic chemotherapy ([place])   . Antineoplastic chemotherapy induced anemia   . Dehydration   . LFT elevation   . Mucositis   . Pancytopenia ([place])   . Hypokalemia   . Hyperglycemia 08/21/2018  . Protein malnutrition ([place]) 07/30/2018  . AKI (acute kidney injury) ([place]) 07/23/2018  . Mucositis due to chemotherapy 07/23/2018  . Chemotherapy-induced nausea 07/17/2018  . Chemotherapy-induced diarrhea 07/17/2018  . Oral candidiasis 07/17/2018  . Port-A-Cath in place 07/09/2018  . Sensorineural hearing loss (SNHL) of both ears  06/18/2018  . Odynophagia 06/18/2018  . Malignant neoplasm of base of tongue ([place]) 05/09/2018  . 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 Owens Shark PT  [facility] 11/17/2018, [place], [place], [address] Phone: [phone]   Fax:  [phone]  Name: LAVAR ROSENZWEIG MRN: [id number] Date of Birth: August 21, 1960

## 2018-11-25 ENCOUNTER — Encounter: Payer: Self-pay | Admitting: Rehabilitation

## 2018-11-25 ENCOUNTER — Other Ambulatory Visit: Payer: Self-pay

## 2018-11-25 ENCOUNTER — Ambulatory Visit: Payer: Commercial Managed Care - PPO | Attending: Family Medicine | Admitting: Rehabilitation

## 2018-11-25 DIAGNOSIS — M436 Torticollis: Secondary | ICD-10-CM | POA: Diagnosis present

## 2018-11-25 DIAGNOSIS — Z483 Aftercare following surgery for neoplasm: Secondary | ICD-10-CM | POA: Insufficient documentation

## 2018-11-25 DIAGNOSIS — R293 Abnormal posture: Secondary | ICD-10-CM

## 2018-11-25 DIAGNOSIS — R1310 Dysphagia, unspecified: Secondary | ICD-10-CM | POA: Diagnosis present

## 2018-11-25 DIAGNOSIS — L599 Disorder of the skin and subcutaneous tissue related to radiation, unspecified: Secondary | ICD-10-CM | POA: Diagnosis present

## 2018-11-25 DIAGNOSIS — M6281 Muscle weakness (generalized): Secondary | ICD-10-CM | POA: Diagnosis present

## 2018-11-25 NOTE — Therapy (Signed)
[place], [place], [address] Phone: [phone]   Fax:  [phone]  Physical Therapy Treatment  Patient Details  Name: Glenn Goodman MRN: [id number] Date of Birth: 08/04/1960 Referring Provider (PT): Reita May Date: 11/25/2018  PT End of Session - 11/25/18 1422    Visit Number  2    Number of Visits  9    Date for PT Re-Evaluation  12/17/18    PT Start Time  1333    PT Stop Time  1415    PT Time Calculation (min)  42 min    Activity Tolerance  Patient tolerated treatment well    Behavior During Therapy  Glenn Goodman for tasks assessed/performed       Past Medical History:  Diagnosis Date  . Arthritis   . Cancer ([place])   . OSA on CPAP   . Restless leg syndrome     Past Surgical History:  Procedure Laterality Date  . DIRECT LARYNGOSCOPY Left 04/22/2018   Dr. Janace Hoard  . FACIAL FRACTURE SURGERY Left 1986  . IR ANGIOGRAM PULMONARY BILATERAL SELECTIVE  08/22/2018  . IR ANGIOGRAM SELECTIVE EACH ADDITIONAL VESSEL  08/22/2018  . IR ANGIOGRAM SELECTIVE EACH ADDITIONAL VESSEL  08/22/2018  . IR GASTROSTOMY TUBE MOD SED  06/30/2018  . IR IMAGING GUIDED PORT INSERTION  06/30/2018  . IR INFUSION THROMBOL ARTERIAL INITIAL (MS)  08/22/2018  . IR INFUSION THROMBOL ARTERIAL INITIAL (MS)  08/22/2018  . IR THROMB F/U EVAL ART/VEN FINAL DAY (MS)  08/23/2018  . IR US GUIDE VASC ACCESS RIGHT  08/22/2018  . MODIFIED RADICAL NECK DISSECTION Left 05/26/2018   Dr. Nicolette Bang, [facility]  . transoral robotic surgery  05/26/2018   TORS, Dr. Nicolette Bang [facility]    There were no vitals filed for this visit.  Subjective Assessment - 11/25/18 1333    Subjective  Doing ok    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 diagnosed on Halloween , TORS  ( surgery in the mouth and thoat with removal of lymph nodes and a section of the back of throat and base of tongue and at same time had external surgery to remove the  lymph nodes of the neck 05/26/18, perineural invasion, extranodal extension 1 of 4 L neck nodes, concurrent adjuvant chemo and radiation 06/2018-08/2018 to oropharynx/bilateral neck, no extractions needed, PEG/PAC placement pending, non smoker/drinker In April pt also had hospitalization for Bilateral PTE and LLE DVT and new onset of DM Pt is still using his PEG tube due to severe pain with eating.    Patient Stated Goals  decrease swelling    Currently in Pain?  Yes    Pain Score  4     Pain Location  Throat    Pain Descriptors / Indicators  Aching;Burning    Pain Type  Surgical pain    Pain Onset  More than a month ago    Pain Frequency  Constant                       [facility] Adult PT Treatment/Exercise - 11/25/18 0001      Manual Therapy   Manual Therapy  Manual Lymphatic Drainage (MLD);Edema management    Manual therapy comments  pt and wife (who is a Marine scientist) showed very good understanding of MLD sequence and principles.      Edema Management  showed pt how to order [place] size medium on [place].  may need a large as he is right at the cutoff.      Manual Lymphatic Drainage (MLD)  Educated pt and wife in seated in front of th emirror [place] anterior MLD approach handout with all steps performed by PT and patient.  Then in supine HOB elevated PT work on the anterior neck and Lt cheek towards Lt axillary nodes             PT Education - 11/25/18 1422    Education Details  self MLD    Person(s) Educated  Patient    Methods  Explanation    Comprehension  Verbalized understanding          PT Long Term Goals - 11/17/18 1912      PT LONG TERM GOAL #1   Title  Pt will increase neck extenstion to 25 degrees    Time  4    Period  Weeks    Status  New      PT LONG TERM GOAL #2   Title  Pt wil report neck pain and stiffness is decreased to 2/10    Baseline  4/10    Time  4    Period  Weeks    Status  New      PT LONG TERM GOAL #3   Title  Pt will be  independent in self manual lymph drainage for neck and will know how to get a compression garment for self managment    Time  4    Period  Weeks    Status  New      PT LONG TERM GOAL #4   Title  Pt will increase opening of mouth from tip of top tooth to tip of bottom tooth to 4.0 cm    Baseline  3.2 cm    Time  4    Period  Weeks    Status  New      PT LONG TERM GOAL #5   Title  Pt will be independent in a home exercise program for UE strength    Time  4    Period  Weeks    Status  New            Plan - 11/25/18 1423    Clinical Impression Statement  Began MLD education today for patient and wife.  pt and wife (who is a Marine scientist) showed very good understanding of MLD sequence and principles.  Also measured pt for marena garment size medium.  Pt may also need tribute/jovipak type garment in the future.  but no pitting or thick edema present today.    PT Treatment/Interventions  ADLs/Self Care Home Management;Patient/family education;Therapeutic exercise;Orthotic Fit/Training;Manual lymph drainage;Manual techniques;Scar mobilization;Compression bandaging;Passive range of motion;Taping;Vasopneumatic Device;Joint Manipulations    PT Next Visit Plan  Review self MLD, get garment?    Manual techniques including scar massage, myofascial work to left neck, soft tisssue work to neck, manual lymph drainage. do TMJ work as needed.  Progress to getting a jovi pack and consider a foam addition to left neck, encourage general exercise, scapular and UE work. later consider taping       Patient will benefit from skilled therapeutic intervention in order to improve the following deficits and impairments:  Decreased knowledge of precautions, Decreased range of motion, Increased fascial restricitons, Decreased scar mobility, Decreased knowledge of use of DME, Decreased strength, Impaired flexibility, Postural dysfunction, Pain, Impaired perceived functional ability, Increased edema  Visit Diagnosis: 1.  Abnormal posture  2. Disorder of the skin and subcutaneous tissue related to radiation, unspecified   3. Muscle weakness (generalized)   4. Stiffness of neck   5. Aftercare following surgery for neoplasm        Problem List Patient Active Problem List   Diagnosis Date Noted  . Acute on chronic respiratory failure with hypoxia ([place])   . Saddle embolus of pulmonary artery ([place])   . Acute pulmonary embolism without acute cor pulmonale (HCC)   . Deep vein thrombosis (DVT) of both lower extremities ([place])   . Leukopenia due to antineoplastic chemotherapy ([place])   . Antineoplastic chemotherapy induced anemia   . Dehydration   . LFT elevation   . Mucositis   . Pancytopenia ([place])   . Hypokalemia   . Hyperglycemia 08/21/2018  . Protein malnutrition ([place]) 07/30/2018  . AKI (acute kidney injury) ([place]) 07/23/2018  . Mucositis due to chemotherapy 07/23/2018  . Chemotherapy-induced nausea 07/17/2018  . Chemotherapy-induced diarrhea 07/17/2018  . Oral candidiasis 07/17/2018  . Port-A-Cath in place 07/09/2018  . Sensorineural hearing loss (SNHL) of both ears 06/18/2018  . Odynophagia 06/18/2018  . Malignant neoplasm of base of tongue ([place]) 05/09/2018  . 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

## 2018-11-25 NOTE — Patient Instructions (Signed)
[place] anterior face MLD handout no changes made  Wear chip pack at least 40min before MLD  How to order [place]

## 2018-11-26 ENCOUNTER — Other Ambulatory Visit: Payer: Self-pay | Admitting: Student

## 2018-11-27 ENCOUNTER — Other Ambulatory Visit: Payer: Self-pay

## 2018-11-27 ENCOUNTER — Encounter: Payer: Self-pay | Admitting: Physical Therapy

## 2018-11-27 ENCOUNTER — Ambulatory Visit: Payer: Commercial Managed Care - PPO | Admitting: Physical Therapy

## 2018-11-27 ENCOUNTER — Other Ambulatory Visit: Payer: Self-pay | Admitting: Student

## 2018-11-27 DIAGNOSIS — R293 Abnormal posture: Secondary | ICD-10-CM

## 2018-11-27 DIAGNOSIS — M6281 Muscle weakness (generalized): Secondary | ICD-10-CM

## 2018-11-27 DIAGNOSIS — L599 Disorder of the skin and subcutaneous tissue related to radiation, unspecified: Secondary | ICD-10-CM

## 2018-11-27 DIAGNOSIS — M436 Torticollis: Secondary | ICD-10-CM

## 2018-11-27 NOTE — Therapy (Signed)
[place], [place], [address] Phone: [phone]   Fax:  [phone]  Physical Therapy Treatment  Patient Details  Name: Glenn Goodman MRN: [id number] Date of Birth: 18-Dec-1960 Referring Provider (PT): Reita May Date: 11/27/2018  PT End of Session - 11/27/18 1659    Visit Number  3    Number of Visits  9    Date for PT Re-Evaluation  12/17/18    Authorization Type  [facility] no auth    PT Start Time  1601    PT Stop Time  1645    PT Time Calculation (min)  44 min    Activity Tolerance  Patient tolerated treatment well    Behavior During Therapy  [facility] for tasks assessed/performed       Past Medical History:  Diagnosis Date  . Arthritis   . Cancer ([place])   . OSA on CPAP   . Restless leg syndrome     Past Surgical History:  Procedure Laterality Date  . DIRECT LARYNGOSCOPY Left 04/22/2018   Dr. Janace Hoard  . FACIAL FRACTURE SURGERY Left 1986  . IR ANGIOGRAM PULMONARY BILATERAL SELECTIVE  08/22/2018  . IR ANGIOGRAM SELECTIVE EACH ADDITIONAL VESSEL  08/22/2018  . IR ANGIOGRAM SELECTIVE EACH ADDITIONAL VESSEL  08/22/2018  . IR GASTROSTOMY TUBE MOD SED  06/30/2018  . IR IMAGING GUIDED PORT INSERTION  06/30/2018  . IR INFUSION THROMBOL ARTERIAL INITIAL (MS)  08/22/2018  . IR INFUSION THROMBOL ARTERIAL INITIAL (MS)  08/22/2018  . IR THROMB F/U EVAL ART/VEN FINAL DAY (MS)  08/23/2018  . IR US GUIDE VASC ACCESS RIGHT  08/22/2018  . MODIFIED RADICAL NECK DISSECTION Left 05/26/2018   Dr. Nicolette Bang, [facility]  . transoral robotic surgery  05/26/2018   TORS, Dr. Nicolette Bang [facility]    There were no vitals filed for this visit.  Subjective Assessment - 11/27/18 1603    Subjective  I have been doing the massage. I just have one question.    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 diagnosed on Halloween , TORS  ( surgery in the mouth and thoat with removal of lymph nodes and a section of the  back of throat and base of tongue and at same time had external surgery to remove the lymph nodes of the neck 05/26/18, perineural invasion, extranodal extension 1 of 4 L neck nodes, concurrent adjuvant chemo and radiation 06/2018-08/2018 to oropharynx/bilateral neck, no extractions needed, PEG/PAC placement pending, non smoker/drinker In April pt also had hospitalization for Bilateral PTE and LLE DVT and new onset of DM Pt is still using his PEG tube due to severe pain with eating.    Patient Stated Goals  decrease swelling    Currently in Pain?  Yes    Pain Score  9     Pain Location  Throat    Pain Orientation  Left         [facility] PT Assessment - 11/27/18 0001      AROM   Cervical Flexion  53    Cervical Extension  25    Cervical - Right Side Bend  33    Cervical - Left Side Bend  32                   [facility] Adult PT Treatment/Exercise - 11/27/18 0001      Manual Therapy   Manual Therapy  Myofascial release;Soft tissue mobilization    Soft  tissue mobilization  to left anterior, lateral and posterior neck in area of tightness, had pt turn head to look over right shoulder throughout and also extending neck to help stretch musculature    Myofascial Release  along left lateral neck gently using cross hands technique             PT Education - 11/27/18 1658    Education Details  head and neck ROM exercises, importance of compliance with daily stretching    Person(s) Educated  Patient    Methods  Explanation    Comprehension  Verbalized understanding          PT Long Term Goals - 11/17/18 1912      PT LONG TERM GOAL #1   Title  Pt will increase neck extenstion to 25 degrees    Time  4    Period  Weeks    Status  New      PT LONG TERM GOAL #2   Title  Pt wil report neck pain and stiffness is decreased to 2/10    Baseline  4/10    Time  4    Period  Weeks    Status  New      PT LONG TERM GOAL #3   Title  Pt will be independent in self manual lymph  drainage for neck and will know how to get a compression garment for self managment    Time  4    Period  Weeks    Status  New      PT LONG TERM GOAL #4   Title  Pt will increase opening of mouth from tip of top tooth to tip of bottom tooth to 4.0 cm    Baseline  3.2 cm    Time  4    Period  Weeks    Status  New      PT LONG TERM GOAL #5   Title  Pt will be independent in a home exercise program for UE strength    Time  4    Period  Weeks    Status  New            Plan - 11/27/18 1700    Clinical Impression Statement  Focused today on decreasing muscle tightness in left lateral neck. Pt feels at times it feels like he could gag it is so tight. Myofascial and soft tissue mobilization performed. AROM measurements taken at end of session showed tremendous gains with cervical extension and lateral flexion. Encouraged pt to continue to stretch at home and also massage tight muscles.    Stability/Clinical Decision Making  Evolving/Moderate complexity    PT Frequency  2x / week    PT Duration  4 weeks    PT Treatment/Interventions  ADLs/Self Care Home Management;Patient/family education;Therapeutic exercise;Orthotic Fit/Training;Manual lymph drainage;Manual techniques;Scar mobilization;Compression bandaging;Passive range of motion;Taping;Vasopneumatic Device;Joint Manipulations    PT Next Visit Plan  see how neck felt after manual work, Review self MLD, get garment?    Manual techniques including scar massage, myofascial work to left neck, soft tisssue work to neck, manual lymph drainage. do TMJ work as needed.  Progress to getting a jovi pack and consider a foam addition to left neck, encourage general exercise, scapular and UE work. later consider taping    PT Home Exercise Plan  head and neck stretches    Consulted and Agree with Plan of Care  Patient       Patient will benefit from skilled  therapeutic intervention in order to improve the following deficits and impairments:   Decreased knowledge of precautions, Decreased range of motion, Increased fascial restricitons, Decreased scar mobility, Decreased knowledge of use of DME, Decreased strength, Impaired flexibility, Postural dysfunction, Pain, Impaired perceived functional ability, Increased edema  Visit Diagnosis: 1. Stiffness of neck   2. Disorder of the skin and subcutaneous tissue related to radiation, unspecified   3. Muscle weakness (generalized)   4. Abnormal posture        Problem List Patient Active Problem List   Diagnosis Date Noted  . Acute on chronic respiratory failure with hypoxia ([place])   . Saddle embolus of pulmonary artery ([place])   . Acute pulmonary embolism without acute cor pulmonale (HCC)   . Deep vein thrombosis (DVT) of both lower extremities ([place])   . Leukopenia due to antineoplastic chemotherapy ([place])   . Antineoplastic chemotherapy induced anemia   . Dehydration   . LFT elevation   . Mucositis   . Pancytopenia ([place])   . Hypokalemia   . Hyperglycemia 08/21/2018  . Protein malnutrition ([place]) 07/30/2018  . AKI (acute kidney injury) ([place]) 07/23/2018  . Mucositis due to chemotherapy 07/23/2018  . Chemotherapy-induced nausea 07/17/2018  . Chemotherapy-induced diarrhea 07/17/2018  . Oral candidiasis 07/17/2018  . Port-A-Cath in place 07/09/2018  . Sensorineural hearing loss (SNHL) of both ears 06/18/2018  . Odynophagia 06/18/2018  . Malignant neoplasm of base of tongue ([place]) 05/09/2018  . 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

## 2018-11-28 ENCOUNTER — Ambulatory Visit (HOSPITAL_COMMUNITY)
Admission: RE | Admit: 2018-11-28 | Discharge: 2018-11-28 | Disposition: A | Discharge status: Home / Self Care | Payer: Commercial Managed Care - PPO | Source: Ambulatory Visit | Attending: Hematology | Admitting: Hematology

## 2018-11-28 ENCOUNTER — Other Ambulatory Visit: Payer: Self-pay

## 2018-11-28 ENCOUNTER — Ambulatory Visit (HOSPITAL_COMMUNITY)
Admission: RE | Admit: 2018-11-28 | Discharge: 2018-11-28 | Disposition: A | Discharge status: Home / Self Care | Payer: Commercial Managed Care - PPO | Source: Ambulatory Visit | Attending: Family Medicine | Admitting: Family Medicine

## 2018-11-28 ENCOUNTER — Encounter (HOSPITAL_COMMUNITY): Payer: Self-pay

## 2018-11-28 DIAGNOSIS — M1990 Unspecified osteoarthritis, unspecified site: Secondary | ICD-10-CM | POA: Diagnosis not present

## 2018-11-28 DIAGNOSIS — G4733 Obstructive sleep apnea (adult) (pediatric): Secondary | ICD-10-CM | POA: Diagnosis not present

## 2018-11-28 DIAGNOSIS — Z923 Personal history of irradiation: Secondary | ICD-10-CM | POA: Diagnosis not present

## 2018-11-28 DIAGNOSIS — Z9221 Personal history of antineoplastic chemotherapy: Secondary | ICD-10-CM | POA: Insufficient documentation

## 2018-11-28 DIAGNOSIS — C099 Malignant neoplasm of tonsil, unspecified: Secondary | ICD-10-CM | POA: Diagnosis not present

## 2018-11-28 DIAGNOSIS — Z452 Encounter for adjustment and management of vascular access device: Secondary | ICD-10-CM | POA: Insufficient documentation

## 2018-11-28 DIAGNOSIS — G2581 Restless legs syndrome: Secondary | ICD-10-CM | POA: Diagnosis not present

## 2018-11-28 DIAGNOSIS — Z7901 Long term (current) use of anticoagulants: Secondary | ICD-10-CM | POA: Diagnosis not present

## 2018-11-28 DIAGNOSIS — Z79899 Other long term (current) drug therapy: Secondary | ICD-10-CM | POA: Insufficient documentation

## 2018-11-28 DIAGNOSIS — Z86711 Personal history of pulmonary embolism: Secondary | ICD-10-CM | POA: Insufficient documentation

## 2018-11-28 HISTORY — PX: IR REMOVAL TUN ACCESS W/ PORT W/O FL MOD SED: IMG2290

## 2018-11-28 LAB — CBC
HCT: 48.5 % (ref 39.0–52.0)
Hemoglobin: 15.5 g/dL (ref 13.0–17.0)
MCH: 32.2 pg (ref 26.0–34.0)
MCHC: 32 g/dL (ref 30.0–36.0)
MCV: 100.6 fL — ABNORMAL HIGH (ref 80.0–100.0)
Platelets: 317 10*3/uL (ref 150–400)
RBC: 4.82 MIL/uL (ref 4.22–5.81)
RDW: 12.9 % (ref 11.5–15.5)
WBC: 8.1 10*3/uL (ref 4.0–10.5)
nRBC: 0 % (ref 0.0–0.2)

## 2018-11-28 LAB — APTT: aPTT: 26 seconds (ref 24–36)

## 2018-11-28 LAB — GLUCOSE, CAPILLARY
Glucose-Capillary: 82 mg/dL (ref 70–99)
Glucose-Capillary: 84 mg/dL (ref 70–99)

## 2018-11-28 LAB — PROTIME-INR
INR: 1 (ref 0.8–1.2)
Prothrombin Time: 13.3 seconds (ref 11.4–15.2)

## 2018-11-28 MED ORDER — MIDAZOLAM HCL 2 MG/2ML IJ SOLN
INTRAMUSCULAR | Status: AC
  Filled 2018-11-28: qty 2

## 2018-11-28 MED ORDER — MIDAZOLAM HCL 2 MG/2ML IJ SOLN
INTRAMUSCULAR | Status: AC | PRN
  Administered 2018-11-28: 1 mg via INTRAVENOUS

## 2018-11-28 MED ORDER — LIDOCAINE HCL (PF) 1 % IJ SOLN
INTRAMUSCULAR | Status: AC | PRN
  Administered 2018-11-28: 5 mL

## 2018-11-28 MED ORDER — CEFAZOLIN SODIUM-DEXTROSE 2-4 GM/100ML-% IV SOLN
2.0000 g | INTRAVENOUS | Status: AC
  Administered 2018-11-28: 2 g via INTRAVENOUS

## 2018-11-28 MED ORDER — FENTANYL CITRATE (PF) 100 MCG/2ML IJ SOLN
INTRAMUSCULAR | Status: AC
  Filled 2018-11-28: qty 2

## 2018-11-28 MED ORDER — FENTANYL CITRATE (PF) 100 MCG/2ML IJ SOLN
INTRAMUSCULAR | Status: AC | PRN
  Administered 2018-11-28: 50 ug via INTRAVENOUS

## 2018-11-28 MED ORDER — LIDOCAINE HCL 1 % IJ SOLN
INTRAMUSCULAR | Status: AC
  Filled 2018-11-28: qty 20

## 2018-11-28 MED ORDER — SODIUM CHLORIDE 0.9 % IV SOLN
INTRAVENOUS | Status: DC
  Administered 2018-11-28: 13:00:00 via INTRAVENOUS

## 2018-11-28 MED ORDER — CEFAZOLIN SODIUM-DEXTROSE 2-4 GM/100ML-% IV SOLN
INTRAVENOUS | Status: AC
  Administered 2018-11-28: 2 g via INTRAVENOUS
  Filled 2018-11-28: qty 100

## 2018-11-28 NOTE — Procedures (Signed)
Interventional Radiology Procedure Note  Procedure: Port removal   Complications: None  Estimated Blood Loss: None  Recommendations: - DC home   Signed,  Heath K. McCullough, MD    

## 2018-11-28 NOTE — Progress Notes (Signed)
CBG not crossing over. The reading was 82 on arrival to short stay. Lennette Bihari, PA aware.

## 2018-11-28 NOTE — H&P (Signed)
Referring Physician(s): Zhao,Yan  Supervising Physician: Jacqulynn Cadet  Patient Status:  [facility] OP  Chief Complaint:  "I'm getting my port out"   Subjective: Patient familiar to IR service from left cervical lymph node biopsy in 2019, Port-A-Cath placement on 06/30/2018 and gastrostomy tube placement on 06/30/2018.  He is status post PE thrombolysis on 08/22/2018 as well.  He has a history of left tonsil squamous cell carcinoma, status post chemoradiation and surgery.  He has no evidence of disease recurrence on recent imaging and presents today for Port-A-Cath removal.  He denies fever, headache, chest pain, dyspnea, cough, abdominal/back pain, nausea, vomiting or bleeding.  He does have some mild discomfort when swallowing.  Past Medical History:  Diagnosis Date  . Arthritis   . Cancer ([place])   . OSA on CPAP   . Restless leg syndrome    Past Surgical History:  Procedure Laterality Date  . DIRECT LARYNGOSCOPY Left 04/22/2018   Dr. Janace Hoard  . FACIAL FRACTURE SURGERY Left 1986  . IR ANGIOGRAM PULMONARY BILATERAL SELECTIVE  08/22/2018  . IR ANGIOGRAM SELECTIVE EACH ADDITIONAL VESSEL  08/22/2018  . IR ANGIOGRAM SELECTIVE EACH ADDITIONAL VESSEL  08/22/2018  . IR GASTROSTOMY TUBE MOD SED  06/30/2018  . IR IMAGING GUIDED PORT INSERTION  06/30/2018  . IR INFUSION THROMBOL ARTERIAL INITIAL (MS)  08/22/2018  . IR INFUSION THROMBOL ARTERIAL INITIAL (MS)  08/22/2018  . IR THROMB F/U EVAL ART/VEN FINAL DAY (MS)  08/23/2018  . IR US GUIDE VASC ACCESS RIGHT  08/22/2018  . MODIFIED RADICAL NECK DISSECTION Left 05/26/2018   Dr. Nicolette Bang, [facility]  . transoral robotic surgery  05/26/2018   TORS, Dr. Nicolette Bang [facility]      Allergies: Patient has no known allergies.  Medications: Prior to Admission medications   Medication Sig Start Date End Date Taking? Authorizing Provider  apixaban (ELIQUIS) 5 MG TABS tablet 52m po bid for 7 days then 541mpo bid , for 30days supply. 08/26/18   XuFlorencia ReasonsMD  bisacodyl  (DULCOLAX) 5 MG EC tablet Take 5 mg by mouth 2 (two) times daily.    [provider]  blood glucose meter kit and supplies KIT Dispense based on patient and insurance preference. Use up to four times daily as directed. (FOR ICD-9 250.00, 250.01). 08/27/18   SmFuller Plan, MD  insulin glargine (LANTUS) 100 unit/mL SOPN Inject 0.18 mLs (18 Units total) into the skin at bedtime for 30 days. 08/27/18 09/26/18  SmNorval MortonMD  insulin lispro (HUMALOG KWIKPEN) 100 UNIT/ML KwikPen Before each meal 3 times a day 140-199 - 2 units; 200-250  4units; 251-299  6 units;  300-349 - 8 units; 350 or above 10 units. 08/26/18   XuFlorencia ReasonsMD  Insulin Pen Needle (PEN NEEDLES 3/16") 31G X 5 MM MISC For insulin injection. 08/26/18   XuFlorencia ReasonsMD  lidocaine (XYLOCAINE) 2 % solution Patient: Mix 1part 2% viscous lidocaine, 1part H20. Swish & swallow 1064mf diluted mixture, 41m55mefore meals and at bedtime, up to QID 07/14/18   SquiEppie Gibson  LORazepam (ATIVAN) 0.5 MG tablet Take 1 tablet (0.5 mg total) by mouth every 6 (six) hours as needed (Nausea or vomiting). 07/30/18   ZhaoTish Men  metFORMIN (GLUCOPHAGE) 500 MG tablet Take 1 tablet (500 mg total) by mouth 2 (two) times daily with a meal. 08/20/18   WentDaleen Bo  morphine (MSIR) 15 MG tablet Take 1 tablet (15 mg total) by mouth every 4 (four)  hours as needed for severe pain. 08/13/18   Tish Men, MD  Neomycin-Bacitracin-Polymyxin (NEOSPORIN EX) Apply 1 application topically daily as needed (neck wound).    [provider]  Nutritional Supplements (FEEDING SUPPLEMENT, OSMOLITE 1.5 CAL,) LIQD Give 1.5 bottles Osmolite 1.5 Bid via PEG and 2 bottles Osmolite 1.5 via PEG for total of 7 bottles daily.  Give 60 cc free water before and after bolus feedings 4 times daily.  In addition consume or give via PEG 240 mL free water 3 times daily.  Please send formula and split gauze to patient.  He does not need other supplies at this time. 08/18/18   Eppie Gibson,  MD  Nutritional Supplements (PROMOD) LIQD Add 30 mL Promod TID. Continue 7 bottles Osmolite 1.5 daily. 08/18/18   Eppie Gibson, MD  ondansetron (ZOFRAN) 8 MG tablet Take 1 tablet (8 mg total) by mouth 2 (two) times daily as needed. Start on the third day after chemotherapy. 06/18/18   Tish Men, MD  pramipexole (MIRAPEX) 1 MG tablet Take 1 mg by mouth at bedtime. 08/08/18   [provider]  silver sulfADIAZINE (SILVADENE) 1 % cream Apply 1 application topically 2 (two) times daily.    [provider]  sodium fluoride (PREVIDENT 5000 PLUS) 1.1 % CREA dental cream Place 1 application onto teeth at bedtime. Apply to tooth brush. Brush teeth for 2 minutes. Spit out excess. DO NOT rinse afterwards. Repeat nightly. 08/27/18   Norval Morton, MD  sucralfate (CARAFATE) 1 GM/10ML suspension Take 10 mLs (1 g total) by mouth 4 (four) times daily -  with meals and at bedtime for 30 days. 10/23/18 11/22/18  Tish Men, MD  Wound Dressings (SONAFINE EX) Apply 1 application topically daily as needed (neck wound).    [provider]     Vital Signs: Blood pressure 147/87, heart rate 89, respirations 20, temp 98.7, O2 sats 97% room air   Physical Exam awake, alert.  Chest clear to auscultation bilaterally.  Clean, intact right chest wall Port-A-Cath.  Heart with regular rate and rhythm.  Abdomen soft, positive bowel sounds, intact gastrostomy tube,NT; no lower extremity edema.  Imaging: No results found.  Labs:  CBC: Recent Labs    08/29/18 1000 09/04/18 1103 09/11/18 1316 10/23/18 1010  WBC 1.3* 2.4* 3.6* 5.4  HGB 10.0* 9.5* 10.2* 13.9  HCT 29.5* 29.2* 32.1* 42.6  PLT 211 277 323 236    COAGS: Recent Labs    04/07/18 0653 06/30/18 1006 08/24/18 0351  INR 1.00 0.94 1.1    BMP: Recent Labs    08/29/18 1000 09/04/18 1103 09/11/18 1316 10/23/18 1010  NA 130* 132* 138 139  K 4.0 3.6 3.6 3.6  CL 93* 96* 99 102  CO2 _0 GLUCOSE 349* 289* 187* 125*  BUN 19  18 21* 22*  CALCIUM 8.4* 9.0 8.9 9.2  CREATININE 0.78 0.70 0.68 0.72  GFRNONAA >60 >60 >60 >60  GFRAA >60 >60 >60 >60    LIVER FUNCTION TESTS: Recent Labs    08/27/18 0409 09/04/18 1103 09/11/18 1316 10/23/18 1010  BILITOT 0.6 0.3 0.3 0.3  AST _1 14*  ALT 55* _2 ALKPHOS 90 86 89 75  PROT 5.7* 5.6* 5.7* 6.5  ALBUMIN 3.0* 3.5 3.8 4.4    Assessment and Plan: Pt with history of left tonsil squamous cell carcinoma, status post chemoradiation and surgery.  He has no evidence of disease recurrence on recent imaging and presents  today for Port-A-Cath removal.  Also with history of bilateral PE and left lower extremity DVT on Eliquis as outpatient which has been held for several days.  Details/risks of procedure, including but not limited to, internal bleeding, infection, injury to adjacent structures discussed with patient with his understanding and consent.   Electronically Signed: D. Rowe Robert, PA-C 11/28/2018, 1:16 PM   I spent a total of 20 minutes at the the patient's bedside AND on the patient's hospital floor or unit, greater than 50% of which was counseling/coordinating care for Port-A-Cath removal

## 2018-11-28 NOTE — Discharge Instructions (Signed)
Implanted Port Removal, Care After °This sheet gives you information about how to care for yourself after your procedure. Your health care provider may also give you more specific instructions. If you have problems or questions, contact your health care provider. °What can I expect after the procedure? °After the procedure, it is common to have: °· Soreness or pain near your incision. °· Some swelling or bruising near your incision. °Follow these instructions at home: °Medicines °· Take over-the-counter and prescription medicines only as told by your health care provider. °· If you were prescribed an antibiotic medicine, take it as told by your health care provider. Do not stop taking the antibiotic even if you start to feel better. °Bathing °· Do not take baths, swim, or use a hot tub until your health care provider approves. Ask your health care provider if you can take showers. You may only be allowed to take sponge baths. °Incision care ° °· Follow instructions from your health care provider about how to take care of your incision. Make sure you: °? Wash your hands with soap and water before you change your bandage (dressing). If soap and water are not available, use hand sanitizer. °? Change your dressing as told by your health care provider. °? Keep your dressing dry. °? Leave stitches (sutures), skin glue, or adhesive strips in place. These skin closures may need to stay in place for 2 weeks or longer. If adhesive strip edges start to loosen and curl up, you may trim the loose edges. Do not remove adhesive strips completely unless your health care provider tells you to do that. °· Check your incision area every day for signs of infection. Check for: °? More redness, swelling, or pain. °? More fluid or blood. °? Warmth. °? Pus or a bad smell. °Driving ° °· Do not drive for 24 hours if you were given a medicine to help you relax (sedative) during your procedure. °· If you did not receive a sedative, ask your  health care provider when it is safe to drive. °Activity °· Return to your normal activities as told by your health care provider. Ask your health care provider what activities are safe for you. °· Do not lift anything that is heavier than 10 lb (4.5 kg), or the limit that you are told, until your health care provider says that it is safe. °· Do not do activities that involve lifting your arms over your head. °General instructions °· Do not use any products that contain nicotine or tobacco, such as cigarettes and e-cigarettes. These can delay healing. If you need help quitting, ask your health care provider. °· Keep all follow-up visits as told by your health care provider. This is important. °Contact a health care provider if: °· You have more redness, swelling, or pain around your incision. °· You have more fluid or blood coming from your incision. °· Your incision feels warm to the touch. °· You have pus or a bad smell coming from your incision. °· You have pain that is not relieved by your pain medicine. °Get help right away if you have: °· A fever or chills. °· Chest pain. °· Difficulty breathing. °Summary °· After the procedure, it is common to have pain, soreness, swelling, or bruising near your incision. °· If you were prescribed an antibiotic medicine, take it as told by your health care provider. Do not stop taking the antibiotic even if you start to feel better. °· Do not drive for 24 hours   if you were given a sedative during your procedure. °· Return to your normal activities as told by your health care provider. Ask your health care provider what activities are safe for you. °This information is not intended to replace advice given to you by your health care provider. Make sure you discuss any questions you have with your health care provider. °Document Released: 04/18/2015 Document Revised: 06/20/2017 Document Reviewed: 06/20/2017 °Elsevier Patient Education © 2020 [employer] Inc[employer] ° ° ° °Moderate  Conscious Sedation, Adult, Care After °These instructions provide you with information about caring for yourself after your procedure. Your health care provider may also give you more specific instructions. Your treatment has been planned according to current medical practices, but problems sometimes occur. Call your health care provider if you have any problems or questions after your procedure. °What can I expect after the procedure? °After your procedure, it is common: °· To feel sleepy for several hours. °· To feel clumsy and have poor balance for several hours. °· To have poor judgment for several hours. °· To vomit if you eat too soon. °Follow these instructions at home: °For at least 24 hours after the procedure: ° °· Do not: °? Participate in activities where you could fall or become injured. °? Drive. °? Use heavy machinery. °? Drink alcohol. °? Take sleeping pills or medicines that cause drowsiness. °? Make important decisions or sign legal documents. °? Take care of children on your own. °· Rest. °Eating and drinking °· Follow the diet recommended by your health care provider. °· If you vomit: °? Drink water, juice, or soup when you can drink without vomiting. °? Make sure you have little or no nausea before eating solid foods. °General instructions °· Have a responsible adult stay with you until you are awake and alert. °· Take over-the-counter and prescription medicines only as told by your health care provider. °· If you smoke, do not smoke without supervision. °· Keep all follow-up visits as told by your health care provider. This is important. °Contact a health care provider if: °· You keep feeling nauseous or you keep vomiting. °· You feel light-headed. °· You develop a rash. °· You have a fever. °Get help right away if: °· You have trouble breathing. °This information is not intended to replace advice given to you by your health care provider. Make sure you discuss any questions you have with your  health care provider. °Document Released: 02/25/2013 Document Revised: 04/19/2017 Document Reviewed: 08/27/2015 °Elsevier Patient Education © 2020 [employer] Inc. ° °

## 2018-12-02 ENCOUNTER — Encounter: Payer: Self-pay | Admitting: Physical Therapy

## 2018-12-02 ENCOUNTER — Other Ambulatory Visit: Payer: Self-pay

## 2018-12-02 ENCOUNTER — Ambulatory Visit: Payer: Commercial Managed Care - PPO | Admitting: Physical Therapy

## 2018-12-02 DIAGNOSIS — R293 Abnormal posture: Secondary | ICD-10-CM

## 2018-12-02 DIAGNOSIS — Z483 Aftercare following surgery for neoplasm: Secondary | ICD-10-CM

## 2018-12-02 DIAGNOSIS — M436 Torticollis: Secondary | ICD-10-CM

## 2018-12-02 DIAGNOSIS — L599 Disorder of the skin and subcutaneous tissue related to radiation, unspecified: Secondary | ICD-10-CM

## 2018-12-02 DIAGNOSIS — M6281 Muscle weakness (generalized): Secondary | ICD-10-CM

## 2018-12-02 NOTE — Therapy (Signed)
[place], [place], [address] Phone: [phone]   Fax:  [phone]  Physical Therapy Treatment  Patient Details  Name: Glenn Goodman MRN: [id number] Date of Birth: July 05, 1960 Referring Provider (PT): Reita May Date: 12/02/2018  PT End of Session - 12/02/18 1636    Visit Number  4    Number of Visits  9    Date for PT Re-Evaluation  12/17/18    PT Start Time  3154    PT Stop Time  1615    PT Time Calculation (min)  45 min    Activity Tolerance  Patient tolerated treatment well    Behavior During Therapy  [facility] for tasks assessed/performed       Past Medical History:  Diagnosis Date  . Arthritis   . Cancer ([place])   . OSA on CPAP   . Restless leg syndrome     Past Surgical History:  Procedure Laterality Date  . DIRECT LARYNGOSCOPY Left 04/22/2018   Dr. Janace Hoard  . FACIAL FRACTURE SURGERY Left 1986  . IR ANGIOGRAM PULMONARY BILATERAL SELECTIVE  08/22/2018  . IR ANGIOGRAM SELECTIVE EACH ADDITIONAL VESSEL  08/22/2018  . IR ANGIOGRAM SELECTIVE EACH ADDITIONAL VESSEL  08/22/2018  . IR GASTROSTOMY TUBE MOD SED  06/30/2018  . IR IMAGING GUIDED PORT INSERTION  06/30/2018  . IR INFUSION THROMBOL ARTERIAL INITIAL (MS)  08/22/2018  . IR INFUSION THROMBOL ARTERIAL INITIAL (MS)  08/22/2018  . IR REMOVAL TUN ACCESS W/ PORT W/O FL MOD SED  11/28/2018  . IR THROMB F/U EVAL ART/VEN FINAL DAY (MS)  08/23/2018  . IR US GUIDE VASC ACCESS RIGHT  08/22/2018  . MODIFIED RADICAL NECK DISSECTION Left 05/26/2018   Dr. Nicolette Bang, [facility]  . transoral robotic surgery  05/26/2018   [facility], Dr. Nicolette Bang [facility]    There were no vitals filed for this visit.  Subjective Assessment - 12/02/18 1632    Subjective  pt states he is doing better. He still has pain when swallowing but it is not as bad as it was previously.    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 diagnosed on Halloween , TORS  ( surgery  in the mouth and thoat with removal of lymph nodes and a section of the back of throat and base of tongue and at same time had external surgery to remove the lymph nodes of the neck 05/26/18, perineural invasion, extranodal extension 1 of 4 L neck nodes, concurrent adjuvant chemo and radiation 06/2018-08/2018 to oropharynx/bilateral neck, no extractions needed, PEG/PAC placement pending, non smoker/drinker In April pt also had hospitalization for Bilateral PTE and LLE DVT and new onset of DM Pt is still using his PEG tube due to severe pain with eating.    Currently in Pain?  Yes    Pain Score  --   did not rate,   Pain Location  Jaw   at TMJ   Pain Orientation  Left    Pain Descriptors / Indicators  Aching    Pain Onset  More than a month ago    Pain Frequency  Intermittent    Aggravating Factors   swallowing    Pain Relieving Factors  not states    Effect of Pain on Daily Activities  hurts to swallow                       [facility] Adult PT Treatment/Exercise - 12/02/18  0001      Exercises   Exercises  Neck;Other Exercises    Other Exercises   mouth opening x 10 reps and jaw translation x 10 reps       Neck Exercises: Seated   Other Seated Exercise  neck ROM       Manual Therapy   Manual Therapy  Soft tissue mobilization;Myofascial release;Manual Lymphatic Drainage (MLD)    Soft tissue mobilization  to left anterior, lateral and posterior neck in area of tightness, had pt turn head to look over right shoulder throughout and also extending neck to help stretch musculature    Myofascial Release  along left lateral neck gently using cross hands technique    Manual Lymphatic Drainage (MLD)  short neck. diaphragmatic breathing, both axillary nodes, anterior and lateral neck , both cheeks                   PT Long Term Goals - 11/17/18 1912      PT LONG TERM GOAL #1   Title  Pt will increase neck extenstion to 25 degrees    Time  4    Period  Weeks    Status  New       PT LONG TERM GOAL #2   Title  Pt wil report neck pain and stiffness is decreased to 2/10    Baseline  4/10    Time  4    Period  Weeks    Status  New      PT LONG TERM GOAL #3   Title  Pt will be independent in self manual lymph drainage for neck and will know how to get a compression garment for self managment    Time  4    Period  Weeks    Status  New      PT LONG TERM GOAL #4   Title  Pt will increase opening of mouth from tip of top tooth to tip of bottom tooth to 4.0 cm    Baseline  3.2 cm    Time  4    Period  Weeks    Status  New      PT LONG TERM GOAL #5   Title  Pt will be independent in a home exercise program for UE strength    Time  4    Period  Weeks    Status  New            Plan - 12/02/18 1637    Clinical Impression Statement  pt seems to be improving with good scar mobility at left lateral neck in neutral position.  Increased tightness with neck extension.  Pt received his garment and will bring it in next session    Comorbidities  prvious chemo, surgery, radiation , PE and DVT and new onset DM    PT Next Visit Plan  MLD with focus on left cheek. myofascial release to left neck expecially with neck extension Review self MLD, check garment for fit and add padding as needed     Manual techniques including scar massage, myofascial work to left neck, soft tisssue work to neck, manual lymph drainage. do TMJ work as needed. , encourage general exercise, scapular and UE work. later consider taping       Patient will benefit from skilled therapeutic intervention in order to improve the following deficits and impairments:  Decreased knowledge of precautions, Decreased range of motion, Increased fascial restricitons, Decreased scar mobility, Decreased knowledge of use  of DME, Decreased strength, Impaired flexibility, Postural dysfunction, Pain, Impaired perceived functional ability, Increased edema  Visit Diagnosis: 1. Stiffness of neck   2. Disorder of the  skin and subcutaneous tissue related to radiation, unspecified   3. Muscle weakness (generalized)   4. Abnormal posture   5. Aftercare following surgery for neoplasm        Problem List Patient Active Problem List   Diagnosis Date Noted  . Acute on chronic respiratory failure with hypoxia ([place])   . Saddle embolus of pulmonary artery ([place])   . Acute pulmonary embolism without acute cor pulmonale (HCC)   . Deep vein thrombosis (DVT) of both lower extremities ([place])   . Leukopenia due to antineoplastic chemotherapy ([place])   . Antineoplastic chemotherapy induced anemia   . Dehydration   . LFT elevation   . Mucositis   . Pancytopenia ([place])   . Hypokalemia   . Hyperglycemia 08/21/2018  . Protein malnutrition ([place]) 07/30/2018  . AKI (acute kidney injury) ([place]) 07/23/2018  . Mucositis due to chemotherapy 07/23/2018  . Chemotherapy-induced nausea 07/17/2018  . Chemotherapy-induced diarrhea 07/17/2018  . Oral candidiasis 07/17/2018  . Port-A-Cath in place 07/09/2018  . Sensorineural hearing loss (SNHL) of both ears 06/18/2018  . Odynophagia 06/18/2018  . Malignant neoplasm of base of tongue ([place]) 05/09/2018  . 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 Owens Shark PT  Norwood Levo 12/02/2018, 4:39 PM  [place], [place], [address] Phone: [phone]   Fax:  [phone]  Name: Glenn Goodman MRN: [id number] Date of Birth: 03/09/1961

## 2018-12-04 ENCOUNTER — Ambulatory Visit: Payer: Commercial Managed Care - PPO

## 2018-12-04 ENCOUNTER — Other Ambulatory Visit: Payer: Self-pay

## 2018-12-04 DIAGNOSIS — Z483 Aftercare following surgery for neoplasm: Secondary | ICD-10-CM

## 2018-12-04 DIAGNOSIS — R293 Abnormal posture: Secondary | ICD-10-CM | POA: Diagnosis not present

## 2018-12-04 DIAGNOSIS — M436 Torticollis: Secondary | ICD-10-CM

## 2018-12-04 DIAGNOSIS — M6281 Muscle weakness (generalized): Secondary | ICD-10-CM

## 2018-12-04 DIAGNOSIS — L599 Disorder of the skin and subcutaneous tissue related to radiation, unspecified: Secondary | ICD-10-CM

## 2018-12-04 NOTE — Therapy (Signed)
[place], [place], [address] Phone: [phone]   Fax:  [phone]  Physical Therapy Treatment  Patient Details  Name: Glenn Goodman MRN: [id number] Date of Birth: 10/21/1960 Referring Provider (PT): Reita May Date: 12/04/2018  PT End of Session - 12/04/18 1424    Visit Number  5    Number of Visits  9    Date for PT Re-Evaluation  12/17/18    PT Start Time  1332    PT Stop Time  1420    PT Time Calculation (min)  48 min    Activity Tolerance  Patient tolerated treatment well    Behavior During Therapy  [facility] for tasks assessed/performed       Past Medical History:  Diagnosis Date  . Arthritis   . Cancer ([place])   . OSA on CPAP   . Restless leg syndrome     Past Surgical History:  Procedure Laterality Date  . DIRECT LARYNGOSCOPY Left 04/22/2018   Dr. Janace Hoard  . FACIAL FRACTURE SURGERY Left 1986  . IR ANGIOGRAM PULMONARY BILATERAL SELECTIVE  08/22/2018  . IR ANGIOGRAM SELECTIVE EACH ADDITIONAL VESSEL  08/22/2018  . IR ANGIOGRAM SELECTIVE EACH ADDITIONAL VESSEL  08/22/2018  . IR GASTROSTOMY TUBE MOD SED  06/30/2018  . IR IMAGING GUIDED PORT INSERTION  06/30/2018  . IR INFUSION THROMBOL ARTERIAL INITIAL (MS)  08/22/2018  . IR INFUSION THROMBOL ARTERIAL INITIAL (MS)  08/22/2018  . IR REMOVAL TUN ACCESS W/ PORT W/O FL MOD SED  11/28/2018  . IR THROMB F/U EVAL ART/VEN FINAL DAY (MS)  08/23/2018  . IR US GUIDE VASC ACCESS RIGHT  08/22/2018  . MODIFIED RADICAL NECK DISSECTION Left 05/26/2018   Dr. Nicolette Bang, [facility]  . transoral robotic surgery  05/26/2018   T[facility], Dr. Nicolette Bang [facility]    There were no vitals filed for this visit.  Subjective Assessment - 12/04/18 1344    Subjective  I have some good days and bad days, today feels like a bad day. It just hurts more to swallow and my tightness feels more pronounced at the Lt side of my neck.    Pertinent History  L base of tongue/tonsillar sulcus, Stage I, p  16 positive diagnosed on Halloween , TORS  ( surgery in the mouth and thoat with removal of lymph nodes and a section of the back of throat and base of tongue and at same time had external surgery to remove the lymph nodes of the neck 05/26/18, perineural invasion, extranodal extension 1 of 4 L neck nodes, concurrent adjuvant chemo and radiation 06/2018-08/2018 to oropharynx/bilateral neck, no extractions needed, PEG/PAC placement pending, non smoker/drinker In April pt also had hospitalization for Bilateral PTE and LLE DVT and new onset of DM Pt is still using his PEG tube due to severe pain with eating.    Patient Stated Goals  decrease swelling    Currently in Pain?  Yes    Pain Score  5     Pain Location  Throat   with swallowing   Pain Descriptors / Indicators  Other (Comment)   excruciating with swallowing   Pain Type  Surgical pain    Pain Onset  More than a month ago    Pain Frequency  Intermittent    Aggravating Factors   swallowing    Pain Relieving Factors  sometimes eating helps  [facility] Adult PT Treatment/Exercise - 12/04/18 0001      Manual Therapy   Manual Therapy  Soft tissue mobilization;Myofascial release;Manual Lymphatic Drainage (MLD);Passive ROM;Manual Traction    Edema Management  Pt brought his new compression head/neck garment and issued 2 pieces of compression foam to try at [place] and anterior throat. One was 1/2" gray foam and other white foam with adhesive backing    Soft tissue mobilization  with massage cream, to left anterior, lateral and posterior neck in area of tightness, had pt turn head to look over right shoulder throughout and also extending neck to help stretch musculature; extra time spent on SCM    Myofascial Release  along left lateral neck gently using cross hands technique over incision and briefly to incision on cheek    Manual Lymphatic Drainage (MLD)  short neck. diaphragmatic breathing, both axillary nodes,  anterior and lateral neck , both cheeks     Passive ROM  Into Rt cervical rotation and side bending during STM    Manual Traction  In coordination with P/ROM                  PT Long Term Goals - 11/17/18 1912      PT LONG TERM GOAL #1   Title  Pt will increase neck extenstion to 25 degrees    Time  4    Period  Weeks    Status  New      PT LONG TERM GOAL #2   Title  Pt wil report neck pain and stiffness is decreased to 2/10    Baseline  4/10    Time  4    Period  Weeks    Status  New      PT LONG TERM GOAL #3   Title  Pt will be independent in self manual lymph drainage for neck and will know how to get a compression garment for self managment    Time  4    Period  Weeks    Status  New      PT LONG TERM GOAL #4   Title  Pt will increase opening of mouth from tip of top tooth to tip of bottom tooth to 4.0 cm    Baseline  3.2 cm    Time  4    Period  Weeks    Status  New      PT LONG TERM GOAL #5   Title  Pt will be independent in a home exercise program for UE strength    Time  4    Period  Weeks    Status  New            Plan - 12/04/18 1424    Clinical Impression Statement  Assessed pts head/neck garment which was a good fit. Did issue 2 different pieces of compression foam for pt to try at [place] and/or anterior throat. Continue with manual therapy, see flowsheet. He reports much benefit after session with less tightness of neck and throat.    Personal Factors and Comorbidities  Comorbidity 3+    Comorbidities  prvious chemo, surgery, radiation , PE and DVT and new onset DM    Examination-Activity Limitations  Other   eating and swallowing   Stability/Clinical Decision Making  Evolving/Moderate complexity    Rehab Potential  Good    PT Frequency  2x / week    PT Duration  4 weeks    PT Treatment/Interventions  ADLs/Self  Care Home Management;Patient/family education;Therapeutic exercise;Orthotic Fit/Training;Manual lymph drainage;Manual  techniques;Scar mobilization;Compression bandaging;Passive range of motion;Taping;Vasopneumatic Device;Joint Manipulations    PT Next Visit Plan  MLD with focus on left cheek. myofascial release to left neck expecially with neck extension Review self MLD prn, assess how he tolerated foam issued today; cont manual techniques including scar massage, myofascial work to left neck, soft tisssue work to neck, manual lymph drainage. do TMJ work as needed. , encourage general exercise, scapular and UE work. later consider taping    PT Home Exercise Plan  head and neck stretches; self MLD and wear compression garment daily    Consulted and Agree with Plan of Care  Patient       Patient will benefit from skilled therapeutic intervention in order to improve the following deficits and impairments:  Decreased knowledge of precautions, Decreased range of motion, Increased fascial restricitons, Decreased scar mobility, Decreased knowledge of use of DME, Decreased strength, Impaired flexibility, Postural dysfunction, Pain, Impaired perceived functional ability, Increased edema  Visit Diagnosis: 1. Stiffness of neck   2. Disorder of the skin and subcutaneous tissue related to radiation, unspecified   3. Muscle weakness (generalized)   4. Abnormal posture   5. Aftercare following surgery for neoplasm        Problem List Patient Active Problem List   Diagnosis Date Noted  . Acute on chronic respiratory failure with hypoxia ([place])   . Saddle embolus of pulmonary artery ([place])   . Acute pulmonary embolism without acute cor pulmonale (HCC)   . Deep vein thrombosis (DVT) of both lower extremities ([place])   . Leukopenia due to antineoplastic chemotherapy ([place])   . Antineoplastic chemotherapy induced anemia   . Dehydration   . LFT elevation   . Mucositis   . Pancytopenia ([place])   . Hypokalemia   . Hyperglycemia 08/21/2018  . Protein malnutrition ([place]) 07/30/2018  . AKI (acute kidney injury) ([place]) 07/23/2018   . Mucositis due to chemotherapy 07/23/2018  . Chemotherapy-induced nausea 07/17/2018  . Chemotherapy-induced diarrhea 07/17/2018  . Oral candidiasis 07/17/2018  . Port-A-Cath in place 07/09/2018  . Sensorineural hearing loss (SNHL) of both ears 06/18/2018  . Odynophagia 06/18/2018  . Malignant neoplasm of base of tongue ([place]) 05/09/2018  . Squamous cell carcinoma of left tonsil (HCC) 04/29/2018    Otelia Limes, PTA 12/04/2018, 2:29 PM  [place], [place], [address] Phone: [phone]   Fax:  [phone]  Name: Glenn Goodman MRN: [id number] Date of Birth: March 21, 1961

## 2018-12-08 ENCOUNTER — Ambulatory Visit: Payer: Commercial Managed Care - PPO

## 2018-12-08 DIAGNOSIS — R1310 Dysphagia, unspecified: Secondary | ICD-10-CM

## 2018-12-08 NOTE — Therapy (Signed)
[facility] [address] [place], [place], [address] Phone: [phone]   Fax:  [phone]  Speech Language Pathology Treatment  Patient Details  Name: Glenn Goodman MRN: [id number] Date of Birth: 1960/11/30 Referring Provider (SLP): Eppie Gibson, MD   Encounter Date: 12/08/2018  End of Session - 12/08/18 1617    Visit Number  6    Number of Visits  7    Date for SLP Re-Evaluation  12/16/18    SLP Start Time  7867    SLP Stop Time   1345    SLP Time Calculation (min)  34 min       Past Medical History:  Diagnosis Date  . Arthritis   . Cancer ([place])   . OSA on CPAP   . Restless leg syndrome     Past Surgical History:  Procedure Laterality Date  . DIRECT LARYNGOSCOPY Left 04/22/2018   Dr. Janace Hoard  . FACIAL FRACTURE SURGERY Left 1986  . IR ANGIOGRAM PULMONARY BILATERAL SELECTIVE  08/22/2018  . IR ANGIOGRAM SELECTIVE EACH ADDITIONAL VESSEL  08/22/2018  . IR ANGIOGRAM SELECTIVE EACH ADDITIONAL VESSEL  08/22/2018  . IR GASTROSTOMY TUBE MOD SED  06/30/2018  . IR IMAGING GUIDED PORT INSERTION  06/30/2018  . IR INFUSION THROMBOL ARTERIAL INITIAL (MS)  08/22/2018  . IR INFUSION THROMBOL ARTERIAL INITIAL (MS)  08/22/2018  . IR REMOVAL TUN ACCESS W/ PORT W/O FL MOD SED  11/28/2018  . IR THROMB F/U EVAL ART/VEN FINAL DAY (MS)  08/23/2018  . IR US GUIDE VASC ACCESS RIGHT  08/22/2018  . MODIFIED RADICAL NECK DISSECTION Left 05/26/2018   Dr. Nicolette Bang, [facility]  . transoral robotic surgery  05/26/2018   TORS, Dr. Nicolette Bang [facility]    There were no vitals filed for this visit.  Subjective Assessment - 12/08/18 1315    Subjective  Pt finished thrush meds 3-4 days ago.    Currently in Pain?  Yes    Pain Score  5     Pain Location  Throat    Pain Orientation  Left    Pain Type  Acute pain    Pain Onset  More than a month ago            ADULT SLP TREATMENT - 12/08/18 1317      General Information   Behavior/Cognition   Alert;Cooperative;Pleasant mood      Treatment Provided   Treatment provided  Dysphagia      Dysphagia Treatment   Temperature Spikes Noted  No    Respiratory Status  Room air    Oral Cavity - Dentition  Adequate natural dentition    Treatment Methods  Patient/caregiver education;Skilled observation;Compensation strategy training    Patient observed directly with PO's  Yes    Type of PO's observed  Thin liquids;Dysphagia 3 (soft);Honey-thick liquids   honey thick was liquid yogurt   Liquids provided via  --   bottle   Oral Phase Signs & Symptoms  --   none noted   Pharyngeal Phase Signs & Symptoms  Other (comment)   none noted   Amount of cueing  Modified independent    Other treatment/comments  Pt begun PT this month - thinks it is helping and he is grateful for it. Pt reported good success with beef stir fry - very tender. Denies any overt s/s aspiration with POs; none seen today (see above). SLP encouraged pt to cont to try to eat POs (dys I-III foods) as tolerated, and  decr tube feed as he is able to maintain his weight. He is only doing HEP x1/day and SLP reminded pt about rationale for HEP and  encouraged him to complete BID. Pt just completed meds for thrush approx 4 days ago and is beginning to note more coating on his lingual surface. SLP told pt that if this increases he needs to contact MD ASAP.        Assessment / Recommendations / Plan   Plan  Continue with current plan of care      Progression Toward Goals   Progression toward goals  Progressing toward goals       SLP Education - 12/08/18 1617    Education Details  rationale for HEP, procedure for super-supraglottic    Person(s) Educated  Patient    Methods  Explanation;Demonstration;Verbal cues    Comprehension  Verbalized understanding;Returned demonstration;Verbal cues required;Need further instruction       SLP Short Term Goals - 10/06/18 1145      SLP SHORT TERM GOAL #1   Title  pt will complete HEP with  rare min A over two sessions    Status  Achieved      SLP [place] #2   Title  pt will tell SLP why he is completing HEP     Status  Achieved   time period - 2 sessions     SLP [place] #3   Title  pt will tell SLP how a food journal can facilitate return to most normal diet    Status  Achieved       SLP Long Term Goals - 12/08/18 1619      SLP LONG TERM GOAL #1   Title  pt will complete HEP with modified independence x3 sessions    Baseline  07-06-18, 10-06-18    Time  1    Period  --   visits (visit #5)   Status  Revised      SLP LONG TERM GOAL #2   Title  pt will tell SLP 3 overt s/s of aspiration PNA with modified independence    Time  --   visit #5   Status  Achieved      SLP LONG TERM GOAL #3   Title  pt will report to SLP when he can change frequency of HEP to 2-3x/week x2 visits    Baseline  11-03-18    Time  1    Period  --   visits (by visit #6)   Status  On-going      SLP LONG TERM GOAL #4   Title  pt will demo super-supraglottic swallow x2 sessions with modified independence    Time  1    Period  --   visits (by visit #6)   Status  On-going       Plan - 12/08/18 1618    Clinical Impression Statement  Visit took place via Webex, per medical director's directive (Dr. Eppie Gibson) due to COVID-19 pandemic. Pt with oropharyngeal swallowing as observed today WNL with dys I and III with thin liquids. See "other treatment/comments" for more details. Pt does not report overt s/s aspiration PNA today. The probability of swallowing difficulty increases dramatically with the pt history of chemo and radiation therapy. Pt will need to be followed by SLP for regular assessment of accurate HEP completion as well as for safety with POs both during and following treatment/s.    Speech Therapy Frequency  --  once approx every four weeks   Duration  --   6 sessions (7 total)   Treatment/Interventions  Aspiration precaution training;Diet toleration management  by SLP;Pharyngeal strengthening exercises;Internal/external aids;Trials of upgraded texture/liquids;Compensatory strategies;Patient/family education;SLP instruction and feedback;Cueing hierarchy;Environmental controls    Potential to Achieve Goals  Good    SLP Home Exercise Plan  provided today    Consulted and Agree with Plan of Care  Patient       Patient will benefit from skilled therapeutic intervention in order to improve the following deficits and impairments:   1. Dysphagia, unspecified type       Problem List Patient Active Problem List   Diagnosis Date Noted  . Acute on chronic respiratory failure with hypoxia ([place])   . Saddle embolus of pulmonary artery ([place])   . Acute pulmonary embolism without acute cor pulmonale (HCC)   . Deep vein thrombosis (DVT) of both lower extremities ([place])   . Leukopenia due to antineoplastic chemotherapy ([place])   . Antineoplastic chemotherapy induced anemia   . Dehydration   . LFT elevation   . Mucositis   . Pancytopenia ([place])   . Hypokalemia   . Hyperglycemia 08/21/2018  . Protein malnutrition ([place]) 07/30/2018  . AKI (acute kidney injury) ([place]) 07/23/2018  . Mucositis due to chemotherapy 07/23/2018  . Chemotherapy-induced nausea 07/17/2018  . Chemotherapy-induced diarrhea 07/17/2018  . Oral candidiasis 07/17/2018  . Port-A-Cath in place 07/09/2018  . Sensorineural hearing loss (SNHL) of both ears 06/18/2018  . Odynophagia 06/18/2018  . Malignant neoplasm of base of tongue ([place]) 05/09/2018  . 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

## 2018-12-09 ENCOUNTER — Ambulatory Visit: Payer: Commercial Managed Care - PPO | Admitting: Physical Therapy

## 2018-12-09 ENCOUNTER — Encounter: Payer: Self-pay | Admitting: Physical Therapy

## 2018-12-09 ENCOUNTER — Other Ambulatory Visit: Payer: Self-pay

## 2018-12-09 DIAGNOSIS — R293 Abnormal posture: Secondary | ICD-10-CM | POA: Diagnosis not present

## 2018-12-09 DIAGNOSIS — L599 Disorder of the skin and subcutaneous tissue related to radiation, unspecified: Secondary | ICD-10-CM

## 2018-12-09 DIAGNOSIS — M436 Torticollis: Secondary | ICD-10-CM

## 2018-12-09 NOTE — Therapy (Signed)
[place], [place], [address] Phone: [phone]   Fax:  [phone]  Physical Therapy Treatment  Patient Details  Name: Glenn Goodman MRN: [id number] Date of Birth: 07/05/1960 Referring Provider (PT): Reita May Date: 12/09/2018  PT End of Session - 12/09/18 1547    Visit Number  6    Number of Visits  9    Date for PT Re-Evaluation  12/17/18    Authorization Type  [facility] no auth    PT Start Time  1501    PT Stop Time  1545    PT Time Calculation (min)  44 min    Activity Tolerance  Patient tolerated treatment well    Behavior During Therapy  [facility] for tasks assessed/performed       Past Medical History:  Diagnosis Date  . Arthritis   . Cancer ([place])   . OSA on CPAP   . Restless leg syndrome     Past Surgical History:  Procedure Laterality Date  . DIRECT LARYNGOSCOPY Left 04/22/2018   Dr. Janace Hoard  . FACIAL FRACTURE SURGERY Left 1986  . IR ANGIOGRAM PULMONARY BILATERAL SELECTIVE  08/22/2018  . IR ANGIOGRAM SELECTIVE EACH ADDITIONAL VESSEL  08/22/2018  . IR ANGIOGRAM SELECTIVE EACH ADDITIONAL VESSEL  08/22/2018  . IR GASTROSTOMY TUBE MOD SED  06/30/2018  . IR IMAGING GUIDED PORT INSERTION  06/30/2018  . IR INFUSION THROMBOL ARTERIAL INITIAL (MS)  08/22/2018  . IR INFUSION THROMBOL ARTERIAL INITIAL (MS)  08/22/2018  . IR REMOVAL TUN ACCESS W/ PORT W/O FL MOD SED  11/28/2018  . IR THROMB F/U EVAL ART/VEN FINAL DAY (MS)  08/23/2018  . IR US GUIDE VASC ACCESS RIGHT  08/22/2018  . MODIFIED RADICAL NECK DISSECTION Left 05/26/2018   Dr. Nicolette Bang, [facility]  . transoral robotic surgery  05/26/2018   TORS, Dr. Nicolette Bang [facility]    There were no vitals filed for this visit.  Subjective Assessment - 12/09/18 1505    Subjective  I think there has been a little improvement in the swallowing. My cheek seems puffier lately. I put the foam in the garment and it seemed to help pretty well.    Pertinent History  L base of  tongue/tonsillar sulcus, Stage I, p 16 positive diagnosed on Halloween , TORS  ( surgery in the mouth and thoat with removal of lymph nodes and a section of the back of throat and base of tongue and at same time had external surgery to remove the lymph nodes of the neck 05/26/18, perineural invasion, extranodal extension 1 of 4 L neck nodes, concurrent adjuvant chemo and radiation 06/2018-08/2018 to oropharynx/bilateral neck, no extractions needed, PEG/PAC placement pending, non smoker/drinker In April pt also had hospitalization for Bilateral PTE and LLE DVT and new onset of DM Pt is still using his PEG tube due to severe pain with eating.    Patient Stated Goals  decrease swelling    Currently in Pain?  Yes    Pain Score  4     Pain Location  Throat    Pain Orientation  Left    Pain Descriptors / Indicators  Sharp    Pain Type  Surgical pain    Pain Onset  More than a month ago    Pain Frequency  Intermittent    Pain Relieving Factors  tylenol  [place] [facility] Adult PT Treatment/Exercise - 12/09/18 0001      Manual Therapy   Soft tissue mobilization  to left anterior, lateral and posterior neck in area of tightness, had pt turn head to look over right shoulder throughout and also extending neck to help stretch musculature; extra time spent on SCM    Myofascial Release  along left lateral neck gently using cross hands technique over incision    Manual Lymphatic Drainage (MLD)  short neck. diaphragmatic breathing, both axillary nodes, anterior and lateral neck , both cheeks     Passive ROM  Into Rt cervical rotation and side bending during STM                  PT Long Term Goals - 11/17/18 1912      PT LONG TERM GOAL #1   Title  Pt will increase neck extenstion to 25 degrees    Time  4    Period  Weeks    Status  New      PT LONG TERM GOAL #2   Title  Pt wil report neck pain and stiffness is decreased to 2/10    Baseline  4/10    Time  4    Period   Weeks    Status  New      PT LONG TERM GOAL #3   Title  Pt will be independent in self manual lymph drainage for neck and will know how to get a compression garment for self managment    Time  4    Period  Weeks    Status  New      PT LONG TERM GOAL #4   Title  Pt will increase opening of mouth from tip of top tooth to tip of bottom tooth to 4.0 cm    Baseline  3.2 cm    Time  4    Period  Weeks    Status  New      PT LONG TERM GOAL #5   Title  Pt will be independent in a home exercise program for UE strength    Time  4    Period  Weeks    Status  New            Plan - 12/09/18 1548    Clinical Impression Statement  Pt has been wearing his head and neck garment with foam and feels it is helping. Continued with manual therapy to decrease sweling especially in left cheek and to decrease tightness in left lateral neck.    Rehab Potential  Good    PT Frequency  2x / week    PT Duration  4 weeks    PT Treatment/Interventions  ADLs/Self Care Home Management;Patient/family education;Therapeutic exercise;Orthotic Fit/Training;Manual lymph drainage;Manual techniques;Scar mobilization;Compression bandaging;Passive range of motion;Taping;Vasopneumatic Device;Joint Manipulations    PT Next Visit Plan  MLD with focus on left cheek. myofascial release to left neck expecially with neck extension Review self MLD prn, assess how he tolerated foam issued today; cont manual techniques including scar massage, myofascial work to left neck, soft tisssue work to neck, manual lymph drainage. do TMJ work as needed. , encourage general exercise, scapular and UE work. later consider taping    PT Home Exercise Plan  head and neck stretches; self MLD and wear compression garment daily    Consulted and Agree with Plan of Care  Patient       Patient will benefit from skilled therapeutic intervention in order to  improve the following deficits and impairments:  Decreased knowledge of precautions, Decreased  range of motion, Increased fascial restricitons, Decreased scar mobility, Decreased knowledge of use of DME, Decreased strength, Impaired flexibility, Postural dysfunction, Pain, Impaired perceived functional ability, Increased edema  Visit Diagnosis: 1. Disorder of the skin and subcutaneous tissue related to radiation, unspecified   2. Stiffness of neck   3. Abnormal posture        Problem List Patient Active Problem List   Diagnosis Date Noted  . Acute on chronic respiratory failure with hypoxia ([place])   . Saddle embolus of pulmonary artery ([place])   . Acute pulmonary embolism without acute cor pulmonale (HCC)   . Deep vein thrombosis (DVT) of both lower extremities ([place])   . Leukopenia due to antineoplastic chemotherapy ([place])   . Antineoplastic chemotherapy induced anemia   . Dehydration   . LFT elevation   . Mucositis   . Pancytopenia ([place])   . Hypokalemia   . Hyperglycemia 08/21/2018  . Protein malnutrition ([place]) 07/30/2018  . AKI (acute kidney injury) ([place]) 07/23/2018  . Mucositis due to chemotherapy 07/23/2018  . Chemotherapy-induced nausea 07/17/2018  . Chemotherapy-induced diarrhea 07/17/2018  . Oral candidiasis 07/17/2018  . Port-A-Cath in place 07/09/2018  . Sensorineural hearing loss (SNHL) of both ears 06/18/2018  . Odynophagia 06/18/2018  . Malignant neoplasm of base of tongue ([place]) 05/09/2018  . 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

## 2018-12-11 ENCOUNTER — Ambulatory Visit: Payer: Commercial Managed Care - PPO | Admitting: Physical Therapy

## 2018-12-11 ENCOUNTER — Other Ambulatory Visit: Payer: Self-pay

## 2018-12-11 ENCOUNTER — Encounter: Payer: Self-pay | Admitting: Physical Therapy

## 2018-12-11 DIAGNOSIS — R293 Abnormal posture: Secondary | ICD-10-CM

## 2018-12-11 DIAGNOSIS — M436 Torticollis: Secondary | ICD-10-CM

## 2018-12-11 DIAGNOSIS — Z483 Aftercare following surgery for neoplasm: Secondary | ICD-10-CM

## 2018-12-11 DIAGNOSIS — L599 Disorder of the skin and subcutaneous tissue related to radiation, unspecified: Secondary | ICD-10-CM

## 2018-12-11 DIAGNOSIS — M6281 Muscle weakness (generalized): Secondary | ICD-10-CM

## 2018-12-11 NOTE — Patient Instructions (Signed)
[email or web address] Resources Self care videos Head and neck

## 2018-12-11 NOTE — Therapy (Signed)
[place], [place], [address] Phone: [phone]   Fax:  [phone]  Physical Therapy Treatment  Patient Details  Name: Glenn Goodman MRN: [id number] Date of Birth: 1961/05/01 Referring Provider (PT): Reita May Date: 12/11/2018  PT End of Session - 12/11/18 1633    Visit Number  7    Number of Visits  9    Date for PT Re-Evaluation  12/17/18    PT Start Time  3244    PT Stop Time  1625    PT Time Calculation (min)  50 min    Activity Tolerance  Patient tolerated treatment well    Behavior During Therapy  [facility] for tasks assessed/performed       Past Medical History:  Diagnosis Date  . Arthritis   . Cancer ([place])   . OSA on CPAP   . Restless leg syndrome     Past Surgical History:  Procedure Laterality Date  . DIRECT LARYNGOSCOPY Left 04/22/2018   Dr. Janace Hoard  . FACIAL FRACTURE SURGERY Left 1986  . IR ANGIOGRAM PULMONARY BILATERAL SELECTIVE  08/22/2018  . IR ANGIOGRAM SELECTIVE EACH ADDITIONAL VESSEL  08/22/2018  . IR ANGIOGRAM SELECTIVE EACH ADDITIONAL VESSEL  08/22/2018  . IR GASTROSTOMY TUBE MOD SED  06/30/2018  . IR IMAGING GUIDED PORT INSERTION  06/30/2018  . IR INFUSION THROMBOL ARTERIAL INITIAL (MS)  08/22/2018  . IR INFUSION THROMBOL ARTERIAL INITIAL (MS)  08/22/2018  . IR REMOVAL TUN ACCESS W/ PORT W/O FL MOD SED  11/28/2018  . IR THROMB F/U EVAL ART/VEN FINAL DAY (MS)  08/23/2018  . IR US GUIDE VASC ACCESS RIGHT  08/22/2018  . MODIFIED RADICAL NECK DISSECTION Left 05/26/2018   Dr. Nicolette Bang, [facility]  . transoral robotic surgery  05/26/2018   TORS, Dr. Nicolette Bang [facility]    There were no vitals filed for this visit.  Subjective Assessment - 12/11/18 1632    Subjective  Pt state he is still having some pain with swallowing and tightness in his neck.  He was able to sleep with the garment on    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 diagnosed on Halloween ,  TORS  ( surgery in the mouth and thoat with removal of lymph nodes and a section of the back of throat and base of tongue and at same time had external surgery to remove the lymph nodes of the neck 05/26/18, perineural invasion, extranodal extension 1 of 4 L neck nodes, concurrent adjuvant chemo and radiation 06/2018-08/2018 to oropharynx/bilateral neck, no extractions needed, PEG/PAC placement pending, non smoker/drinker In April pt also had hospitalization for Bilateral PTE and LLE DVT and new onset of DM Pt is still using his PEG tube due to severe pain with eating.    Currently in Pain?  Yes    Pain Score  --   did not rate                      [facility] Adult PT Treatment/Exercise - 12/11/18 0001      Self-Care   Other Self-Care Comments   talked with pt about [employer] and gave pt brochure.  If he would like to pursue  will need to consult with his doctors for clearance due to history of blood clots       Manual Therapy   Soft tissue mobilization  to left anterior, lateral and posterior neck in area of tightness,  had pt turn head to look over right shoulder throughout and also extending neck to help stretch musculature; extra time spent on SCM    Myofascial Release  along left lateral neck gently using cross hands technique over incision    Manual Lymphatic Drainage (MLD)  short neck. diaphragmatic breathing, both axillary nodes, anterior and lateral neck , both cheeks     Passive ROM  Into Rt cervical rotation and side bending              PT Education - 12/11/18 1629    Education Details  gave pt link for [employer] self MLD video    Person(s) Educated  Patient    Methods  Explanation;Handout    Comprehension  Verbalized understanding          PT Long Term Goals - 11/17/18 1912      PT LONG TERM GOAL #1   Title  Pt will increase neck extenstion to 25 degrees    Time  4    Period  Weeks    Status  New      PT LONG TERM GOAL #2   Title  Pt wil report  neck pain and stiffness is decreased to 2/10    Baseline  4/10    Time  4    Period  Weeks    Status  New      PT LONG TERM GOAL #3   Title  Pt will be independent in self manual lymph drainage for neck and will know how to get a compression garment for self managment    Time  4    Period  Weeks    Status  New      PT LONG TERM GOAL #4   Title  Pt will increase opening of mouth from tip of top tooth to tip of bottom tooth to 4.0 cm    Baseline  3.2 cm    Time  4    Period  Weeks    Status  New      PT LONG TERM GOAL #5   Title  Pt will be independent in a home exercise program for UE strength    Time  4    Period  Weeks    Status  New            Plan - 12/11/18 1634    Clinical Impression Statement  Pt seems to be making improvment with better skin mobility and less firmness in soft tissue.  He still has very tight band in lateral neck seen especially in sitting with right side bending of neck    Comorbidities  prvious chemo, surgery, radiation , PE and DVT and new onset DM    Rehab Potential  Good    PT Frequency  2x / week    PT Duration  4 weeks    PT Next Visit Plan  MLD with focus on left cheek. myofascial release to left neck expecially with neck extension Review self MLD prn, assess how he tolerated foam issued today; cont manual techniques including scar massage, myofascial work to left neck, soft tisssue work to neck, manual lymph drainage. do TMJ work as needed. , encourage general exercise, scapular and UE work. later consider taping    Consulted and Agree with Plan of Care  Patient       Patient will benefit from skilled therapeutic intervention in order to improve the following deficits and impairments:  Decreased knowledge of precautions, Decreased range  of motion, Increased fascial restricitons, Decreased scar mobility, Decreased knowledge of use of DME, Decreased strength, Impaired flexibility, Postural dysfunction, Pain, Impaired perceived functional  ability, Increased edema  Visit Diagnosis: 1. Disorder of the skin and subcutaneous tissue related to radiation, unspecified   2. Stiffness of neck   3. Abnormal posture   4. Muscle weakness (generalized)   5. Aftercare following surgery for neoplasm        Problem List Patient Active Problem List   Diagnosis Date Noted  . Acute on chronic respiratory failure with hypoxia ([place])   . Saddle embolus of pulmonary artery ([place])   . Acute pulmonary embolism without acute cor pulmonale (HCC)   . Deep vein thrombosis (DVT) of both lower extremities ([place])   . Leukopenia due to antineoplastic chemotherapy ([place])   . Antineoplastic chemotherapy induced anemia   . Dehydration   . LFT elevation   . Mucositis   . Pancytopenia ([place])   . Hypokalemia   . Hyperglycemia 08/21/2018  . Protein malnutrition ([place]) 07/30/2018  . AKI (acute kidney injury) ([place]) 07/23/2018  . Mucositis due to chemotherapy 07/23/2018  . Chemotherapy-induced nausea 07/17/2018  . Chemotherapy-induced diarrhea 07/17/2018  . Oral candidiasis 07/17/2018  . Port-A-Cath in place 07/09/2018  . Sensorineural hearing loss (SNHL) of both ears 06/18/2018  . Odynophagia 06/18/2018  . Malignant neoplasm of base of tongue ([place]) 05/09/2018  . 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 Owens Shark PT  [place] 12/11/2018, [place], [place], [address] Phone: [phone]   Fax:  [phone]  Name: MAKAIL WATLING MRN: [id number] Date of Birth: 1961-01-26

## 2018-12-16 ENCOUNTER — Other Ambulatory Visit: Payer: Self-pay

## 2018-12-16 ENCOUNTER — Ambulatory Visit: Payer: Commercial Managed Care - PPO | Admitting: Physical Therapy

## 2018-12-16 ENCOUNTER — Encounter: Payer: Self-pay | Admitting: Physical Therapy

## 2018-12-16 DIAGNOSIS — M6281 Muscle weakness (generalized): Secondary | ICD-10-CM

## 2018-12-16 DIAGNOSIS — R293 Abnormal posture: Secondary | ICD-10-CM

## 2018-12-16 DIAGNOSIS — L599 Disorder of the skin and subcutaneous tissue related to radiation, unspecified: Secondary | ICD-10-CM

## 2018-12-16 DIAGNOSIS — M436 Torticollis: Secondary | ICD-10-CM

## 2018-12-16 NOTE — Therapy (Signed)
[place], [place], [address] Phone: [phone]   Fax:  [phone]  Physical Therapy Treatment  Patient Details  Name: Glenn Goodman MRN: [id number] Date of Birth: [birth year] Referring Provider (PT): Reita May Date: 12/16/2018  PT End of Session - 12/16/18 3559    Visit Number  8    Number of Visits  9    Date for PT Re-Evaluation  12/17/18    Authorization Type  [facility] no auth    PT Start Time  1500    PT Stop Time  1545    PT Time Calculation (min)  45 min    Activity Tolerance  Patient tolerated treatment well    Behavior During Therapy  [facility] for tasks assessed/performed       Past Medical History:  Diagnosis Date  . Arthritis   . Cancer ([place])   . OSA on CPAP   . Restless leg syndrome     Past Surgical History:  Procedure Laterality Date  . DIRECT LARYNGOSCOPY Left 04/22/2018   Dr. Janace Hoard  . FACIAL FRACTURE SURGERY Left 1986  . IR ANGIOGRAM PULMONARY BILATERAL SELECTIVE  08/22/2018  . IR ANGIOGRAM SELECTIVE EACH ADDITIONAL VESSEL  08/22/2018  . IR ANGIOGRAM SELECTIVE EACH ADDITIONAL VESSEL  08/22/2018  . IR GASTROSTOMY TUBE MOD SED  06/30/2018  . IR IMAGING GUIDED PORT INSERTION  06/30/2018  . IR INFUSION THROMBOL ARTERIAL INITIAL (MS)  08/22/2018  . IR INFUSION THROMBOL ARTERIAL INITIAL (MS)  08/22/2018  . IR REMOVAL TUN ACCESS W/ PORT W/O FL MOD SED  11/28/2018  . IR THROMB F/U EVAL ART/VEN FINAL DAY (MS)  08/23/2018  . IR US GUIDE VASC ACCESS RIGHT  08/22/2018  . MODIFIED RADICAL NECK DISSECTION Left 05/26/2018   Dr. Nicolette Bang, [facility]  . transoral robotic surgery  05/26/2018   TORS, Dr. Nicolette Bang [facility]    There were no vitals filed for this visit.  Subjective Assessment - 12/16/18 1506    Subjective  Pt states he needs to be compliant with his home exercise program.    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 diagnosed on Halloween , TORS  ( surgery in  the mouth and thoat with removal of lymph nodes and a section of the back of throat and base of tongue and at same time had external surgery to remove the lymph nodes of the neck 05/26/18, perineural invasion, extranodal extension 1 of 4 L neck nodes, concurrent adjuvant chemo and radiation 06/2018-08/2018 to oropharynx/bilateral neck, no extractions needed, PEG/PAC placement pending, non smoker/drinker In April pt also had hospitalization for Bilateral PTE and LLE DVT and new onset of DM Pt is still using his PEG tube due to severe pain with eating.    Patient Stated Goals  decrease swelling    Currently in Pain?  Yes    Pain Score  5     Pain Location  Throat                       [facility] Adult PT Treatment/Exercise - 12/16/18 0001      Exercises   Exercises  Shoulder;Elbow    Other Exercises   Instructed pt in TMJ exercises and issued handout as follows: mouth opening, lateral flexion in both directions, circles in both directions      Elbow Exercises   Elbow Flexion  Strengthening;Both;10 reps   3 lb weights   Elbow  Extension  Strengthening   overhead tricep extension with 3 lb weight     Shoulder Exercises: Standing   Other Standing Exercises  3 way shoulder raises x 10 reps each direction with 3 lb weights, pt return demonstrated       Manual Therapy   Edema Management  assessed pt's compression garment with addition of foam and encouraged pt to also wear foam in submental area to help with swelling there    Soft tissue mobilization  to left anterior, lateral and posterior neck in area of tightness, had pt turn head to look over right shoulder throughout  extra time spent on SCM    Myofascial Release  along left lateral neck gently using cross hands technique over incision    Manual Lymphatic Drainage (MLD)  short neck. diaphragmatic breathing, both axillary nodes, anterior and lateral neck , both cheeks     Passive ROM  Into Rt cervical rotation and side bending                    PT Long Term Goals - 12/16/18 1507      PT LONG TERM GOAL #1   Title  Pt will increase neck extenstion to 25 degrees    Baseline  12/16/18- 28    Time  4    Period  Weeks    Status  Achieved      PT LONG TERM GOAL #2   Title  Pt wil report neck pain and stiffness is decreased to 2/10    Baseline  4/10, 12/16/18-3/10    Time  4    Period  Weeks    Status  On-going      PT LONG TERM GOAL #3   Title  Pt will be independent in self manual lymph drainage for neck and will know how to get a compression garment for self managment    Baseline  12/16/18- pt is independent with self MLD and has obtain a compression garment that he wears daily    Time  4    Period  Weeks    Status  Achieved      PT LONG TERM GOAL #4   Title  Pt will increase opening of mouth from tip of top tooth to tip of bottom tooth to 4.0 cm    Baseline  3.2 cm, 12/16/18- 4 cm    Time  4    Period  Weeks    Status  Achieved      PT LONG TERM GOAL #5   Title  Pt will be independent in a home exercise program for UE strength    Time  4    Period  Weeks    Status  On-going            Plan - 12/16/18 1555    Clinical Impression Statement  Assessed pt's progress towards goals in therapy. He has met his neck ROM goal as well as his jaw openening goal. Instructed pt in new TMJ exercises today as well as UE exercises and issued handout. Pt is still having some tightness in his neck and is progressing towards that goal but he has not met it yet.    Rehab Potential  Good    PT Frequency  2x / week    PT Duration  4 weeks    PT Treatment/Interventions  ADLs/Self Care Home Management;Patient/family education;Therapeutic exercise;Orthotic Fit/Training;Manual lymph drainage;Manual techniques;Scar mobilization;Compression bandaging;Passive range of motion;Taping;Vasopneumatic Device;Joint Manipulations    PT  Next Visit Plan  d/c or recert, see how TMJ exercises were, MLD with focus on left cheek.  myofascial release to left neck expecially with neck extension Review self MLD prn, assess how he tolerated foam issued today; cont manual techniques including scar massage, myofascial work to left neck, soft tisssue work to neck, manual lymph drainage. do TMJ work as needed. , encourage general exercise, scapular and UE work. later consider taping    PT Home Exercise Plan  head and neck stretches; self MLD and wear compression garment daily    Consulted and Agree with Plan of Care  Patient       Patient will benefit from skilled therapeutic intervention in order to improve the following deficits and impairments:  Decreased knowledge of precautions, Decreased range of motion, Increased fascial restricitons, Decreased scar mobility, Decreased knowledge of use of DME, Decreased strength, Impaired flexibility, Postural dysfunction, Pain, Impaired perceived functional ability, Increased edema  Visit Diagnosis: 1. Stiffness of neck   2. Disorder of the skin and subcutaneous tissue related to radiation, unspecified   3. Abnormal posture   4. Muscle weakness (generalized)        Problem List Patient Active Problem List   Diagnosis Date Noted  . Acute on chronic respiratory failure with hypoxia ([place])   . Saddle embolus of pulmonary artery ([place])   . Acute pulmonary embolism without acute cor pulmonale (HCC)   . Deep vein thrombosis (DVT) of both lower extremities ([place])   . Leukopenia due to antineoplastic chemotherapy ([place])   . Antineoplastic chemotherapy induced anemia   . Dehydration   . LFT elevation   . Mucositis   . Pancytopenia ([place])   . Hypokalemia   . Hyperglycemia 08/21/2018  . Protein malnutrition ([place]) 07/30/2018  . AKI (acute kidney injury) ([place]) 07/23/2018  . Mucositis due to chemotherapy 07/23/2018  . Chemotherapy-induced nausea 07/17/2018  . Chemotherapy-induced diarrhea 07/17/2018  . Oral candidiasis 07/17/2018  . Port-A-Cath in place 07/09/2018  . Sensorineural hearing  loss (SNHL) of both ears 06/18/2018  . Odynophagia 06/18/2018  . Malignant neoplasm of base of tongue ([place]) 05/09/2018  . 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

## 2018-12-16 NOTE — Patient Instructions (Signed)
3 Way Raises:      Starting Position:  Leaning against wall, walk feet a few inches away from the wall and make tummy tight (tuck hips underneath you) Press back/shoulders/head against wall as much as possible. Keep thumbs up to ceiling, elbows straight and shoulders relaxed/down throughout.  1. Lift arms in front to shoulder height 2. Lift arms a little wider into a "V" to shoulder height 3. Lift arms out to sides in a "T" to shoulder height  Perform 10 times in each direction. Hold 3 lbs to start with and work up to 2-3 sets of 10/day. Perform 3-4 times/week. Increase weight as able, decreasing sets of 10 each time you increase weights, then slowly working your way back up to 2-3 sets each time.    Cancer Rehab [phone]    Triceps Extension (Triceps Strength)    Sit holding _3__ lb weight in hand. Breathe in. Straighten weighted arm toward ceiling, breathing out through pursed lips. Use other arm for support. Breathing in, gently bend arm, bringing weight behind head, elbow pointed toward ceiling. Repeat _10__ times. Repeat with other arm. Do __1_ sessions per day. Variation: Do without weight.  Copyright  VHI. All rights reserved.   Bicep Curls 3lbs x 10 reps  When you are able to complete 4 sets of 10 reps of the above exercises you can increase to 4 lb hand weights

## 2018-12-18 ENCOUNTER — Other Ambulatory Visit: Payer: Self-pay

## 2018-12-18 ENCOUNTER — Ambulatory Visit: Payer: Commercial Managed Care - PPO | Admitting: Physical Therapy

## 2018-12-18 ENCOUNTER — Encounter: Payer: Self-pay | Admitting: Physical Therapy

## 2018-12-18 DIAGNOSIS — R293 Abnormal posture: Secondary | ICD-10-CM | POA: Diagnosis not present

## 2018-12-18 DIAGNOSIS — L599 Disorder of the skin and subcutaneous tissue related to radiation, unspecified: Secondary | ICD-10-CM

## 2018-12-18 DIAGNOSIS — M436 Torticollis: Secondary | ICD-10-CM

## 2018-12-18 NOTE — Therapy (Signed)
[place], [place], [address] Phone: [phone]   Fax:  [phone]  Physical Therapy Treatment  Patient Details  Name: Glenn Goodman MRN: [id number] Date of Birth: 09-Jul-1960 Referring Provider (PT): Reita May Date: 12/18/2018  PT End of Session - 12/18/18 1550    Visit Number  9    Number of Visits  9    Date for PT Re-Evaluation  12/17/18    PT Start Time  1501    PT Stop Time  1545    PT Time Calculation (min)  44 min    Activity Tolerance  Patient tolerated treatment well    Behavior During Therapy  [facility] for tasks assessed/performed       Past Medical History:  Diagnosis Date  . Arthritis   . Cancer ([place])   . OSA on CPAP   . Restless leg syndrome     Past Surgical History:  Procedure Laterality Date  . DIRECT LARYNGOSCOPY Left 04/22/2018   Dr. Janace Hoard  . FACIAL FRACTURE SURGERY Left 1986  . IR ANGIOGRAM PULMONARY BILATERAL SELECTIVE  08/22/2018  . IR ANGIOGRAM SELECTIVE EACH ADDITIONAL VESSEL  08/22/2018  . IR ANGIOGRAM SELECTIVE EACH ADDITIONAL VESSEL  08/22/2018  . IR GASTROSTOMY TUBE MOD SED  06/30/2018  . IR IMAGING GUIDED PORT INSERTION  06/30/2018  . IR INFUSION THROMBOL ARTERIAL INITIAL (MS)  08/22/2018  . IR INFUSION THROMBOL ARTERIAL INITIAL (MS)  08/22/2018  . IR REMOVAL TUN ACCESS W/ PORT W/O FL MOD SED  11/28/2018  . IR THROMB F/U EVAL ART/VEN FINAL DAY (MS)  08/23/2018  . IR US GUIDE VASC ACCESS RIGHT  08/22/2018  . MODIFIED RADICAL NECK DISSECTION Left 05/26/2018   Dr. Nicolette Bang, [facility]  . transoral robotic surgery  05/26/2018   TORS, Dr. Nicolette Bang [facility]    There were no vitals filed for this visit.  Subjective Assessment - 12/18/18 1503    Subjective  Pt has been doing arm exercises with no problem.    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 diagnosed on Halloween , TORS  ( surgery in the mouth and thoat with removal of lymph nodes and a  section of the back of throat and base of tongue and at same time had external surgery to remove the lymph nodes of the neck 05/26/18, perineural invasion, extranodal extension 1 of 4 L neck nodes, concurrent adjuvant chemo and radiation 06/2018-08/2018 to oropharynx/bilateral neck, no extractions needed, PEG/PAC placement pending, non smoker/drinker In April pt also had hospitalization for Bilateral PTE and LLE DVT and new onset of DM Pt is still using his PEG tube due to severe pain with eating.    Patient Stated Goals  decrease swelling    Currently in Pain?  Yes    Pain Score  4     Pain Location  Throat    Pain Orientation  Left                       [facility] Adult PT Treatment/Exercise - 12/18/18 0001      Manual Therapy   Soft tissue mobilization  to left anterior, lateral and posterior neck in area of tightness, had pt turn head to look over right shoulder throughout  extra time spent on SCM, also along bilateral upper traps    Myofascial Release  along left lateral neck gently using cross hands technique over incision    Manual  Lymphatic Drainage (MLD)  short neck. diaphragmatic breathing, both axillary nodes, anterior and lateral neck , both cheeks     Passive ROM  Into Rt cervical rotation and side bending                   PT Long Term Goals - 12/18/18 1504      PT LONG TERM GOAL #1   Title  Pt will increase neck extenstion to 25 degrees    Baseline  12/16/18- 28    Status  Achieved      PT LONG TERM GOAL #2   Title  Pt wil report neck pain and stiffness is decreased to 2/10    Baseline  4/10, 12/16/18-3/10, 12/18/18- 2/10- pt reports pain no longer limits him    Status  Achieved      PT LONG TERM GOAL #3   Title  Pt will be independent in self manual lymph drainage for neck and will know how to get a compression garment for self managment    Baseline  12/16/18- pt is independent with self MLD and has obtain a compression garment that he wears daily     Status  Achieved      PT LONG TERM GOAL #4   Title  Pt will increase opening of mouth from tip of top tooth to tip of bottom tooth to 4.0 cm    Baseline  3.2 cm, 12/16/18- 4 cm    Status  Achieved      PT LONG TERM GOAL #5   Title  Pt will be independent in a home exercise program for UE strength    Baseline  12/18/18- pt is now independent with this    Status  Achieved            Plan - 12/18/18 1551    Clinical Impression Statement  Pt has now met all goals in therapy today. He has been able to return to work and has not been limited by his neck. He has been compliant with his home exercise program and his stretches. He was educated today to do less reps of his stretches but to hold them for 30-60 seconds. Pt will be discharged from skilled PT services at this time.    Stability/Clinical Decision Making  Evolving/Moderate complexity    Rehab Potential  Good    PT Frequency  2x / week    PT Duration  4 weeks    PT Treatment/Interventions  ADLs/Self Care Home Management;Patient/family education;Therapeutic exercise;Orthotic Fit/Training;Manual lymph drainage;Manual techniques;Scar mobilization;Compression bandaging;Passive range of motion;Taping;Vasopneumatic Device;Joint Manipulations    PT Next Visit Plan  d/c this visit    PT Home Exercise Plan  head and neck stretches; self MLD and wear compression garment daily    Consulted and Agree with Plan of Care  Patient       Patient will benefit from skilled therapeutic intervention in order to improve the following deficits and impairments:  Decreased knowledge of precautions, Decreased range of motion, Increased fascial restricitons, Decreased scar mobility, Decreased knowledge of use of DME, Decreased strength, Impaired flexibility, Postural dysfunction, Pain, Impaired perceived functional ability, Increased edema  Visit Diagnosis: 1. Stiffness of neck   2. Abnormal posture   3. Disorder of the skin and subcutaneous tissue related  to radiation, unspecified        Problem List Patient Active Problem List   Diagnosis Date Noted  . Acute on chronic respiratory failure with hypoxia ([place])   . Saddle embolus  of pulmonary artery ([place])   . Acute pulmonary embolism without acute cor pulmonale (HCC)   . Deep vein thrombosis (DVT) of both lower extremities ([place])   . Leukopenia due to antineoplastic chemotherapy ([place])   . Antineoplastic chemotherapy induced anemia   . Dehydration   . LFT elevation   . Mucositis   . Pancytopenia ([place])   . Hypokalemia   . Hyperglycemia 08/21/2018  . Protein malnutrition ([place]) 07/30/2018  . AKI (acute kidney injury) ([place]) 07/23/2018  . Mucositis due to chemotherapy 07/23/2018  . Chemotherapy-induced nausea 07/17/2018  . Chemotherapy-induced diarrhea 07/17/2018  . Oral candidiasis 07/17/2018  . Port-A-Cath in place 07/09/2018  . Sensorineural hearing loss (SNHL) of both ears 06/18/2018  . Odynophagia 06/18/2018  . Malignant neoplasm of base of tongue ([place]) 05/09/2018  . 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  Patient goals were met. Patient is being discharged due to meeting the stated rehab goals.  ?????    Allyson Sabal [place], [place] 12/18/18 3:53 PM

## 2018-12-19 ENCOUNTER — Other Ambulatory Visit: Payer: Self-pay | Admitting: Hematology

## 2018-12-19 DIAGNOSIS — K1231 Oral mucositis (ulcerative) due to antineoplastic therapy: Secondary | ICD-10-CM

## 2019-01-01 ENCOUNTER — Telehealth: Payer: Self-pay | Admitting: Radiation Oncology

## 2019-01-02 ENCOUNTER — Other Ambulatory Visit: Payer: Self-pay | Admitting: Hematology

## 2019-01-02 ENCOUNTER — Telehealth: Payer: Self-pay | Admitting: *Deleted

## 2019-01-02 ENCOUNTER — Inpatient Hospital Stay: Payer: Commercial Managed Care - PPO | Attending: Hematology | Admitting: Nutrition

## 2019-01-02 DIAGNOSIS — C099 Malignant neoplasm of tonsil, unspecified: Secondary | ICD-10-CM

## 2019-01-02 NOTE — Telephone Encounter (Signed)
"  Jahid We[facility] [phone]).  Calling to request Dr. Maylon Peppers order feeding tube be removed.  Should be a note from dietician I spoke with earlier.  I am eating well.  Message left for Dr. Lanell Persons nurse also."

## 2019-01-02 NOTE — Telephone Encounter (Signed)
The order to remove feeding tube is entered.  Thanks.  Dr. Maylon Peppers

## 2019-01-02 NOTE — Progress Notes (Signed)
Nutrition follow-up completed with patient on telephone. He is status post treatment for tonsil cancer. Reports he is eating more and has not used his feeding tube since July 25. Last weight documented is 175 pounds on July 10. Patient weighs at home and reports weight is stable around 172 pounds. Patient reports pain level has decreased and is around 5. Patient would like to have his feeding tube removed.  Nutrition diagnosis: Inadequate oral intake improved.  Intervention: Educated patient to continue oral intake but add an additional 350 cal with one Ensure Enlive daily. Patient referred to MD for feeding tube removal. Patient has contact information for further questions.  Monitoring, evaluation, goals: Patient will continue to tolerate increased intake so feeding tube can be removed.  No follow-up scheduled.  Patient has my contact information.  **Disclaimer: This note was dictated with voice recognition software. Similar sounding words can inadvertently be transcribed and this note may contain transcription errors which may not have been corrected upon publication of note.**

## 2019-01-05 ENCOUNTER — Other Ambulatory Visit: Payer: Self-pay

## 2019-01-05 ENCOUNTER — Ambulatory Visit: Payer: Commercial Managed Care - PPO | Attending: Family Medicine

## 2019-01-05 ENCOUNTER — Inpatient Hospital Stay: Payer: Commercial Managed Care - PPO | Admitting: Nutrition

## 2019-01-05 DIAGNOSIS — R1310 Dysphagia, unspecified: Secondary | ICD-10-CM | POA: Insufficient documentation

## 2019-01-05 NOTE — Therapy (Signed)
[facility] [address] [place], [place], [address] Phone: [phone]   Fax:  [phone]  Speech Language Pathology Treatment  Patient Details  Name: Glenn Goodman MRN: [id number] Date of Birth: [birth year] Referring Provider (SLP): Eppie Gibson, MD   Encounter Date: 01/05/2019  End of Session - 01/05/19 1449    Visit Number  7    Number of Visits  10    Date for SLP Re-Evaluation  03/13/19       Past Medical History:  Diagnosis Date  . Arthritis   . Cancer ([place])   . OSA on CPAP   . Restless leg syndrome     Past Surgical History:  Procedure Laterality Date  . DIRECT LARYNGOSCOPY Left 04/22/2018   Dr. Janace Hoard  . FACIAL FRACTURE SURGERY Left 1986  . IR ANGIOGRAM PULMONARY BILATERAL SELECTIVE  08/22/2018  . IR ANGIOGRAM SELECTIVE EACH ADDITIONAL VESSEL  08/22/2018  . IR ANGIOGRAM SELECTIVE EACH ADDITIONAL VESSEL  08/22/2018  . IR GASTROSTOMY TUBE MOD SED  06/30/2018  . IR IMAGING GUIDED PORT INSERTION  06/30/2018  . IR INFUSION THROMBOL ARTERIAL INITIAL (MS)  08/22/2018  . IR INFUSION THROMBOL ARTERIAL INITIAL (MS)  08/22/2018  . IR REMOVAL TUN ACCESS W/ PORT W/O FL MOD SED  11/28/2018  . IR THROMB F/U EVAL ART/VEN FINAL DAY (MS)  08/23/2018  . IR US GUIDE VASC ACCESS RIGHT  08/22/2018  . MODIFIED RADICAL NECK DISSECTION Left 05/26/2018   Dr. Nicolette Bang, [facility]  . transoral robotic surgery  05/26/2018   TORS, Dr. Nicolette Bang [facility]    There were no vitals filed for this visit.  Subjective Assessment - 01/05/19 1539    Subjective  Pt to have PEG removed tomrrow 01-06-19. Ate sub sandwich and chips today for lunch. Still needs more liquids with solid POs.            ADULT SLP TREATMENT - 01/05/19 1451      General Information   Behavior/Cognition  Alert;Cooperative;Pleasant mood      Treatment Provided   Treatment provided  Dysphagia      Dysphagia Treatment   Temperature Spikes Noted  No    Respiratory Status   Room air    Oral Cavity - Dentition  Adequate natural dentition    Treatment Methods  Skilled observation;Therapeutic exercise;Patient/caregiver education    Patient observed directly with PO's  Yes    Type of PO's observed  Regular;Thin liquids    Liquids provided via  Cup    Oral Phase Signs & Symptoms  --   none noted   Pharyngeal Phase Signs & Symptoms  --   none noted   Amount of cueing  Independent    Other treatment/comments  Pt independent with POs. Pt req'd min-mod A for super-supraglottic. decr to once every other month due to progress with POs (pt ate sub sandwich and potato chips for lunch today), and with succes for HEP procedure. SLP omitted vocal adduction from pt's HEP.      Assessment / Recommendations / Plan   Plan  Continue with current plan of care   likely no more than 2 more visits prior to d/c     Progression Toward Goals   Progression toward goals  Progressing toward goals       SLP Education - 01/05/19 1535    Education Details  Super supraglottic procedure, decr in frequency    Person(s) Educated  Patient    Methods  Explanation  Comprehension  Verbalized understanding       SLP Short Term Goals - 01/05/19 1532      SLP SHORT TERM GOAL #1   Title  pt will complete HEP with rare min A over two sessions    Status  Achieved      SLP SHORT TERM GOAL #2   Title  pt will tell SLP why he is completing HEP     Status  Achieved   time period - 2 sessions     SLP SHORT TERM GOAL #3   Title  pt will tell SLP how a food journal can facilitate return to most normal diet    Status  Achieved       SLP Long Term Goals - 01/05/19 1532      SLP LONG TERM GOAL #1   Title  pt will complete HEP with modified independence x3 sessions    Baseline  07-06-18, 10-06-18    Status  Achieved      SLP LONG TERM GOAL #2   Title  pt will tell SLP 3 overt s/s of aspiration PNA with modified independence    Time  --   visit #5   Status  Achieved      SLP LONG TERM  GOAL #3   Title  pt will report to SLP when he can change frequency of HEP to 2-3x/week x2 visits    Baseline  11-03-18    Time  1    Period  --   visits (by visit #6)   Status  On-going      SLP LONG TERM GOAL #4   Title  pt will demo super-supraglottic swallow x2 sessions with modified independence    Time  2   renewed 01-05-19   Period  --   visits (by visit #6)   Status  On-going       Plan - 01/05/19 1537    Clinical Impression Statement  Visit took place via Webex, per medical director's directive (Dr. Eppie Gibson) due to COVID-19 pandemic. Pt with oropharyngeal swallowing as observed today WNL with regular diet and thin liquids. Pt likely d/c in 1-2 visits - SLP needs to verify consistency with HEP completion and accuracy and PO safety. See "other treatment/comments" for more details. Pt does not report overt s/s aspiration PNA today. The probability of swallowing difficulty increases dramatically with the pt history of chemo and radiation therapy. Pt will need to be followed by SLP for regular assessment of accurate HEP completion as well as for safety with POs both during and following treatment/s.    Speech Therapy Frequency  --   once approx every four weeks   Duration  --   6 sessions (7 total)   Treatment/Interventions  Aspiration precaution training;Diet toleration management by SLP;Pharyngeal strengthening exercises;Internal/external aids;Trials of upgraded texture/liquids;Compensatory strategies;Patient/family education;SLP instruction and feedback;Cueing hierarchy;Environmental controls    Potential to Achieve Goals  Good    SLP Home Exercise Plan  provided today    Consulted and Agree with Plan of Care  Patient       Patient will benefit from skilled therapeutic intervention in order to improve the following deficits and impairments:   1. Dysphagia, unspecified type       Problem List Patient Active Problem List   Diagnosis Date Noted  . Acute on chronic  respiratory failure with hypoxia ([place])   . Saddle embolus of pulmonary artery ([place])   . Acute pulmonary embolism without acute cor  pulmonale ([place])   . Deep vein thrombosis (DVT) of both lower extremities ([place])   . Leukopenia due to antineoplastic chemotherapy ([place])   . Antineoplastic chemotherapy induced anemia   . Dehydration   . LFT elevation   . Mucositis   . Pancytopenia ([place])   . Hypokalemia   . Hyperglycemia 08/21/2018  . Protein malnutrition ([place]) 07/30/2018  . AKI (acute kidney injury) ([place]) 07/23/2018  . Mucositis due to chemotherapy 07/23/2018  . Chemotherapy-induced nausea 07/17/2018  . Chemotherapy-induced diarrhea 07/17/2018  . Oral candidiasis 07/17/2018  . Port-A-Cath in place 07/09/2018  . Sensorineural hearing loss (SNHL) of both ears 06/18/2018  . Odynophagia 06/18/2018  . Malignant neoplasm of base of tongue ([place]) 05/09/2018  . 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

## 2019-01-06 ENCOUNTER — Encounter (HOSPITAL_COMMUNITY): Payer: Self-pay | Admitting: Radiology

## 2019-01-06 ENCOUNTER — Ambulatory Visit (HOSPITAL_COMMUNITY)
Admission: RE | Admit: 2019-01-06 | Discharge: 2019-01-06 | Disposition: A | Discharge status: Home / Self Care | Payer: Commercial Managed Care - PPO | Source: Ambulatory Visit | Attending: Hematology | Admitting: Hematology

## 2019-01-06 DIAGNOSIS — Z431 Encounter for attention to gastrostomy: Secondary | ICD-10-CM | POA: Insufficient documentation

## 2019-01-06 DIAGNOSIS — C099 Malignant neoplasm of tonsil, unspecified: Secondary | ICD-10-CM

## 2019-01-06 HISTORY — PX: IR GASTROSTOMY TUBE REMOVAL: IMG5492

## 2019-01-06 MED ORDER — LIDOCAINE VISCOUS HCL 2 % MT SOLN
OROMUCOSAL | Status: AC
  Filled 2019-01-06: qty 15

## 2019-01-06 NOTE — Procedures (Signed)
Patient's 20 French pull-through gastrostomy tube was removed in its entirety per order of Dr. Maylon Peppers.  Gauze dressing applied over site.  Site care instructions reviewed with patient. EBL < 2 cc.

## 2019-01-13 NOTE — Progress Notes (Addendum)
Glenn Goodman presents for follow up of radiation completed 08/21/18 to his tongue/ head and neck  Pain issues, if any: He reports pain a 4/10 when swallowing at times. He tells that his pain is much more tolerable at this time.  Using a feeding tube?: removed 01/06/19 Weight changes, if any:  Wt Readings from Last 3 Encounters:  01/16/19 174 lb (78.9 kg)  11/28/18 175 lb (79.4 kg)  10/23/18 178 lb 1.3 oz (80.8 kg)   Swallowing issues, if any: He denies. He continues to stay away from spicy, and crunchy foods. He avoids dense meat.  Smoking or chewing tobacco? no Using fluoride trays daily? He is using prevident toothpaste.  Last ENT visit was on: Dr. Nicolette Bang 10/29/18 and again on September 8th.  Other notable issues, if any:  10/23/18 Dr. Maylon Peppers 10/29/18 Dr. Nicolette Bang He reports decrease appetite. Food is not always enjoyable to him.   He mentions that he would like to have a few more sessions of PT.    BP 120/84   Pulse (!) 106   Temp 98 F (36.7 C) (Tympanic)   Wt 174 lb (78.9 kg)   SpO2 97% Comment: room air  BMI 26.46 kg/m

## 2019-01-16 ENCOUNTER — Encounter: Payer: Self-pay | Admitting: Radiation Oncology

## 2019-01-16 ENCOUNTER — Ambulatory Visit
Admission: RE | Admit: 2019-01-16 | Discharge: 2019-01-16 | Disposition: A | Discharge status: Home / Self Care | Payer: Commercial Managed Care - PPO | Source: Ambulatory Visit | Attending: Radiation Oncology | Admitting: Radiation Oncology

## 2019-01-16 ENCOUNTER — Other Ambulatory Visit: Payer: Self-pay

## 2019-01-16 VITALS — BP 120/84 | HR 106 | Temp 98.0°F | Wt 174.0 lb

## 2019-01-16 DIAGNOSIS — I890 Lymphedema, not elsewhere classified: Secondary | ICD-10-CM | POA: Diagnosis not present

## 2019-01-16 DIAGNOSIS — Z79899 Other long term (current) drug therapy: Secondary | ICD-10-CM | POA: Diagnosis not present

## 2019-01-16 DIAGNOSIS — Z923 Personal history of irradiation: Secondary | ICD-10-CM | POA: Diagnosis not present

## 2019-01-16 DIAGNOSIS — Z794 Long term (current) use of insulin: Secondary | ICD-10-CM | POA: Insufficient documentation

## 2019-01-16 DIAGNOSIS — R1310 Dysphagia, unspecified: Secondary | ICD-10-CM | POA: Diagnosis not present

## 2019-01-16 DIAGNOSIS — Z85819 Personal history of malignant neoplasm of unspecified site of lip, oral cavity, and pharynx: Secondary | ICD-10-CM | POA: Insufficient documentation

## 2019-01-16 DIAGNOSIS — Z7901 Long term (current) use of anticoagulants: Secondary | ICD-10-CM | POA: Diagnosis not present

## 2019-01-16 DIAGNOSIS — C099 Malignant neoplasm of tonsil, unspecified: Secondary | ICD-10-CM

## 2019-01-16 DIAGNOSIS — C01 Malignant neoplasm of base of tongue: Secondary | ICD-10-CM

## 2019-01-16 NOTE — Progress Notes (Signed)
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 Squamous cell carcinoma of left tonsil ([facility])  C09.9 Ambulatory referral to Physical Therapy  2. Malignant neoplasm of base of tongue ([facility])  C01     CHIEF COMPLAINT: follow up and surveillance of tongue cancer  Narrative: The patient returns today for routine follow up. Since his last visit, he underwent restaging chest and neck CT scans. Performed on 10/20/2018, these showed no evidence of active disease.  Pain issues, if any: He reports pain a 4/10 when swallowing at times. He tells that his pain is much more tolerable at this time.   Using a feeding tube?: removed 01/06/2019  Weight changes, if any:  Wt Readings from Last 3 Encounters:  01/16/19 174 lb (78.9 kg)  11/28/18 175 lb (79.4 kg)  10/23/18 178 lb 1.3 oz (80.8 kg)   Swallowing issues, if any: He denies. He continues to stay away from spicy, and crunchy foods. He avoids dense meat.   Smoking or chewing tobacco? no  Using fluoride trays daily? He is using prevident toothpaste. no new dental issues.  Oct- sees dentist for cleanings   Last ENT visit was on: Dr. Nicolette Bang 10/29/2018   Other notable issues, if any: He  reports decrease appetite. Food is not always enjoyable to him. He is scheduled to see Dr. Nicolette Bang on 01/27/2019.   Some neck swelling, wants more PT.   ALLERGIES:  has No Known Allergies.  Meds: Current Outpatient Medications  Medication Sig Dispense Refill  . Acetaminophen (TYLENOL PO) Take by mouth.    Marland Kitchen apixaban (ELIQUIS) 5 MG TABS tablet 70m po bid for 7 days then 554mpo bid , for 30days supply. 60 tablet 0  . blood glucose meter kit and supplies KIT Dispense based on patient and insurance preference. Use up to four times daily as directed. (FOR ICD-9 250.00, 250.01). 1 each 0  . FAMOTIDINE PO Take 10 mg by mouth 2 (two) times daily.     . metFORMIN (GLUCOPHAGE) 500 MG tablet Take 1 tablet (500 mg total) by mouth 2 (two) times daily with a meal. 60 tablet 0  . pramipexole (MIRAPEX) 1 MG tablet Take 1 mg by mouth at bedtime.    . sodium fluoride (PREVIDENT 5000 PLUS) 1.1 % CREA dental cream Place 1 application onto teeth at bedtime. Apply to tooth brush. Brush teeth for 2 minutes. Spit out excess. DO NOT rinse afterwards. Repeat nightly.    . sucralfate (CARAFATE) 1 GM/10ML suspension TAKE 10 MLS (1 G TOTAL) BY MOUTH 4 (FOUR) TIMES DAILY - WITH MEALS AND AT BEDTIME FOR 30 DAYS. 420 mL 2  . insulin glargine (LANTUS) 100 unit/mL SOPN Inject 0.18 mLs (18  Units total) into the skin at bedtime for 30 days. 15 mL 0  . insulin lispro (HUMALOG KWIKPEN) 100 UNIT/ML KwikPen Before each meal 3 times a day 140-199 - 2 units; 200-250  4units; 251-299  6 units;  300-349 - 8 units; 350 or above 10 units. (Patient not taking: Reported on 01/16/2019) 15 mL 0  . Insulin Pen Needle (PEN NEEDLES 3/16") 31G X 5 MM MISC For insulin injection. 100 each 0  . lidocaine (XYLOCAINE) 2 % solution Patient: Mix 1part 2% viscous lidocaine, 1part H20. Swish & swallow 21m of diluted mixture, 322m before meals and at bedtime, up to QID (Patient not taking: Reported on 01/16/2019) 100 mL 5  . LORazepam (ATIVAN) 0.5 MG tablet  Take 1 tablet (0.5 mg total) by mouth every 6 (six) hours as needed (Nausea or vomiting). (Patient not taking: Reported on 01/16/2019) 30 tablet 0  . morphine (MSIR) 15 MG tablet Take 1 tablet (15 mg total) by mouth every 4 (four) hours as needed for severe pain. (Patient not taking: Reported on 01/16/2019) 60 tablet 0  . ondansetron (ZOFRAN) 8 MG tablet Take 1 tablet (8 mg total) by mouth 2 (two) times daily as needed. Start on the third day after chemotherapy. (Patient not taking: Reported on 01/16/2019) 30 tablet 1  . silver sulfADIAZINE (SILVADENE) 1 % cream Apply 1 application topically 2 (two) times daily.     No current facility-administered medications for this encounter.    Facility-Administered Medications Ordered in Other Encounters  Medication Dose Route Frequency Provider Last Rate Last Dose  . heparin lock flush 100 unit/mL  500 Units Intravenous Once Ennever, PeRudell CobbMD      . sodium chloride flush (NS) 0.9 % injection 10 mL  10 mL Intravenous PRN EnVolanda NapoleonMD        Physical Findings: The patient is in no acute distress. Patient is alert and oriented. Wt Readings from Last 3 Encounters:  01/16/19 174 lb (78.9 kg)  11/28/18 175 lb (79.4 kg)  10/23/18 178 lb 1.3 oz (80.8 kg)    weight is 174 lb (78.9 kg). His tympanic temperature is 98 F (36.7 C). His blood pressure is 120/84 and his pulse is 106 (abnormal). His oxygen saturation is 97%. .   General: Alert and oriented, in no acute distress HEENT: Head is normocephalic. Extraocular movements are intact. Oropharynx is clear. Neck: Neck is supple, no palpable cervical or supraclavicular lymphadenopathy. Mild lymphedema Lymphatics: see Neck Exam Skin: No concerning lesions. Neurologic: Cranial nerves II through XII are grossly intact. No obvious focalities. Speech is fluent. Coordination is intact. Psychiatric: Judgment and insight are intact. Affect is appropriate.   Lab Findings: Lab Results  Component Value Date    WBC 8.1 11/28/2018   HGB 15.5 11/28/2018   HCT 48.5 11/28/2018   MCV 100.6 (H) 11/28/2018   PLT 317 11/28/2018    Lab Results  Component Value Date   TSH 1.262 09/11/2018    Radiographic Findings: Ir Gastrostomy Tube Removal/repair  Result Date: 01/06/2019 INDICATION: Patient with history of head and neck cancer and prior placement of a gastrostomy tube on 06/30/2018; patient is currently eating and request received for gastrostomy tube removal EXAM: GASTROSTOMY TUBE REMOVAL MEDICATIONS: Viscous lidocaine applied into gastrostomy insertion site tract ANESTHESIA/SEDATION: None CONTRAST:  None FLUOROSCOPY TIME:  None COMPLICATIONS: None immediate. PROCEDURE: Informed consent was obtained from the patient after a thorough discussion of the procedural risks, benefits and alternatives. All questions were addressed. A timeout  was performed prior to the initiation of the procedure. The 27 French pull-through gastrostomy tube was removed in its entirety using manual traction. Gauze dressing applied over site. IMPRESSION: Successful removal of 20 French pull-through gastrostomy tube as discussed above. Read by: Rowe Robert, PA-C Electronically Signed   By: Jerilynn Mages.  Shick M.D.   On: 01/06/2019 15:46    Impression/Plan:    1) Head and Neck Cancer Status: NED  2) Nutritional Status: good PEG tube: removed  3) Swallowing: as tolerated, continue SLP exercises  5) Dental: Encouraged to continue regular followup with dentistry, and dental hygiene including fluoride rinses.   6) Thyroid function: check annually   -  ordered by med/onc. Lab Results  Component Value Date   TSH 1.262 09/11/2018   7)   f/u w/ ENT and med onc; I'll stay in the loop and see him in a year, sooner if needed. He is pleased with this plan.  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    _____________________________________   Eppie Gibson, MD  This document serves as a record of services personally performed by Eppie Gibson, MD. It was created on her behalf by Wilburn Mylar, a trained medical scribe. The creation of this record is based on the scribe's personal observations and the provider's statements to them. This document has been checked and approved by the attending provider.

## 2019-01-19 ENCOUNTER — Telehealth: Payer: Self-pay | Admitting: *Deleted

## 2019-01-19 NOTE — Telephone Encounter (Signed)
CALLED PATIENT TO INFORM OF FU APPT. WITH DR. Isidore Moos ON 01-15-20 @ 2 PM, SPOKE WITH PATIENT AND HE IS AWARE OF THIS APPT.

## 2019-01-22 ENCOUNTER — Other Ambulatory Visit: Payer: Self-pay

## 2019-01-22 ENCOUNTER — Ambulatory Visit: Payer: Commercial Managed Care - PPO | Attending: Family Medicine | Admitting: Physical Therapy

## 2019-01-22 ENCOUNTER — Encounter: Payer: Self-pay | Admitting: Physical Therapy

## 2019-01-22 DIAGNOSIS — I890 Lymphedema, not elsewhere classified: Secondary | ICD-10-CM | POA: Diagnosis not present

## 2019-01-22 DIAGNOSIS — L599 Disorder of the skin and subcutaneous tissue related to radiation, unspecified: Secondary | ICD-10-CM

## 2019-01-22 DIAGNOSIS — Z483 Aftercare following surgery for neoplasm: Secondary | ICD-10-CM | POA: Insufficient documentation

## 2019-01-22 DIAGNOSIS — M436 Torticollis: Secondary | ICD-10-CM | POA: Diagnosis present

## 2019-01-22 DIAGNOSIS — M6281 Muscle weakness (generalized): Secondary | ICD-10-CM | POA: Insufficient documentation

## 2019-01-22 DIAGNOSIS — R293 Abnormal posture: Secondary | ICD-10-CM | POA: Insufficient documentation

## 2019-01-22 NOTE — Therapy (Signed)
[place], [place], [address] Phone: [phone]   Fax:  [phone]  Physical Therapy Evaluation  Patient Details  Name: Glenn Goodman MRN: [id number] Date of Birth: [birth year] Referring Provider (PT): Reita May Date: 01/22/2019  PT End of Session - 01/22/19 0908    Visit Number  1    Number of Visits  5    Date for PT Re-Evaluation  02/19/19    PT Start Time  0836    PT Stop Time  0905    PT Time Calculation (min)  29 min    Activity Tolerance  Patient tolerated treatment well    Behavior During Therapy  [facility] for tasks assessed/performed       Past Medical History:  Diagnosis Date  . Arthritis   . Cancer ([place])   . OSA on CPAP   . Restless leg syndrome     Past Surgical History:  Procedure Laterality Date  . DIRECT LARYNGOSCOPY Left 04/22/2018   Dr. Janace Hoard  . FACIAL FRACTURE SURGERY Left 1986  . IR ANGIOGRAM PULMONARY BILATERAL SELECTIVE  08/22/2018  . IR ANGIOGRAM SELECTIVE EACH ADDITIONAL VESSEL  08/22/2018  . IR ANGIOGRAM SELECTIVE EACH ADDITIONAL VESSEL  08/22/2018  . IR GASTROSTOMY TUBE MOD SED  06/30/2018  . IR GASTROSTOMY TUBE REMOVAL  01/06/2019  . IR IMAGING GUIDED PORT INSERTION  06/30/2018  . IR INFUSION THROMBOL ARTERIAL INITIAL (MS)  08/22/2018  . IR INFUSION THROMBOL ARTERIAL INITIAL (MS)  08/22/2018  . IR REMOVAL TUN ACCESS W/ PORT W/O FL MOD SED  11/28/2018  . IR THROMB F/U EVAL ART/VEN FINAL DAY (MS)  08/23/2018  . IR US GUIDE VASC ACCESS RIGHT  08/22/2018  . MODIFIED RADICAL NECK DISSECTION Left 05/26/2018   Dr. Nicolette Bang, [facility]  . transoral robotic surgery  05/26/2018   TORS, Dr. Nicolette Bang [facility]    There were no vitals filed for this visit.   Subjective Assessment - 01/22/19 0837    Subjective  I worked 12 hour days for about 3 weeks straight and then I was on vacation for a week. I think I just need a refesher. My lymphedema is mild and I think that could be better and my  range of motion is okay but I think it could be better. I need a refesher on how to do the exercises. I do not know if I did an adequate job of explaining to my wife on how to do the massage.    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 diagnosed on Halloween , TORS  ( surgery in the mouth and thoat with removal of lymph nodes and a section of the back of throat and base of tongue and at same time had external surgery to remove the lymph nodes of the neck 05/26/18, perineural invasion, extranodal extension 1 of 4 L neck nodes, concurrent adjuvant chemo and radiation 06/2018-08/2018 to oropharynx/bilateral neck, no extractions needed, PEG/PAC placement pending, non smoker/drinker In April pt also had hospitalization for Bilateral PTE and LLE DVT and new onset of DM Pt is still using his PEG tube due to severe pain with eating.         [facility] PT Assessment - 01/22/19 0001      Assessment   Medical Diagnosis  L base of tongue/tonsillar cancer    Referring Provider (PT)  Squire    Onset Date/Surgical Date  05/26/18    Hand Dominance  Right    Prior Therapy  therapy prior this year in July 2020 for lymphedema and neck ROM      Precautions   Precautions  None      Restrictions   Weight Bearing Restrictions  No      Balance Screen   Has the patient fallen in the past 6 months  No    Has the patient had a decrease in activity level because of a fear of falling?   No    Is the patient reluctant to leave their home because of a fear of falling?   No      Home Film/video editor residence    Living Arrangements  Spouse/significant other    Available Help at Discharge  Family    Type of [place]      Prior Function   Level of [place]  Full time employment    Medical illustrator     Leisure  pt is not currently exercising      Cognition   Overall Cognitive Status  Within Functional Limits  for tasks assessed      Observation/Other Assessments   Observations  well healed scar at left neck, some slight fullness at anterior neck    Skin Integrity  no open areas       Posture/Postural Control   Posture/Postural Control  Postural limitations    Postural Limitations  Rounded Shoulders;Forward head    Posture Comments  --      AROM   Right Shoulder Flexion  --    Left Shoulder Flexion  --    Cervical Flexion  41    Cervical Extension  26    Cervical - Right Side Bend  27    Cervical - Left Side Bend  25      Strength   Overall Strength Comments  --      Palpation   Palpation comment  tightness at upper trap        LYMPHEDEMA/ONCOLOGY QUESTIONNAIRE - 01/22/19 0850      Head and Neck   4 cm superior to sternal notch around neck  41 cm    6 cm superior to sternal notch around neck  40.5 cm    8 cm superior to sternal notch around neck  42 cm             Objective measurements completed on examination: See above findings.      [facility] Adult PT Treatment/Exercise - 01/22/19 0001      Exercises   Other Exercises   instructed and had pt demonstrate one rep of each head and neck stretch on handout and hold for 30 seconds                  PT Long Term Goals - 01/22/19 0915      PT LONG TERM GOAL #1   Title  Pt will increase L and R cervical lateral flexion to 31 degrees to allow pt to return to PLOF    Baseline  R 27, L 25    Time  4    Period  Weeks    Status  New    Target Date  02/19/19      PT LONG TERM GOAL #2   Title  Pt will report a 75% improvement in neck and upper shoulder stiffness to allow improved comfort    Time  4    Period  Weeks    Status  New    Target Date  02/19/19      PT LONG TERM GOAL #3   Title  Pt and wife will be independent in self manual lymph drainage for neck for self management    Time  4    Period  Weeks    Status  New    Target Date  02/19/19      PT LONG TERM GOAL #4   Title  Pt will be  independent in a home exercise program for continued strengthening and stretching    Time  4    Period  Weeks    Status  New    Target Date  02/19/19             Plan - 01/22/19 0908    Clinical Impression Statement  Pt returns to therapy after discharge at the end of July 2020. He reports that after he was discharged he went on vacation for a week and then returned to work, working 12 hour days for the last 3 weeks. He reports during this time he stopped doing his exercises and lymphedema massage and needs to be refreshed on the appropriate way to do those things. He would like his wife to come to an appointment to be instructed in proper MLD techniques. Cervical ROM is slightly decreased today compared to measurements taken in July. His neck circumference has remained decreased though. He has trouble sleeping with the compression garment at night in addition to his CPAP stating it causes him to suck in air and he wakes up belching. Instructed pt to try wearing chip pack overnight instead and use garment in the evening. Re educated pt in ROM exercises today. Pt would benefit from skilled PT services to improve cervical ROM, instruct pt and wife in correct MLD techique and progress pt towards independence with a home exercise program.    Personal Factors and Comorbidities  Fitness    Comorbidities  prvious chemo, surgery, radiation , PE and DVT and new onset DM    Stability/Clinical Decision Making  Stable/Uncomplicated    Clinical Decision Making  Low    Rehab Potential  Good    PT Frequency  1x / week    PT Duration  4 weeks    PT Treatment/Interventions  ADLs/Self Care Home Management;Patient/family education;Therapeutic exercise;Orthotic Fit/Training;Manual lymph drainage;Manual techniques;Scar mobilization;Compression bandaging;Passive range of motion;Taping;Vasopneumatic Device;Joint Manipulations;Therapeutic activities    PT Next Visit Plan  instruct pt's wife in MLD technique if she  comes to appt, STM to upper traps and neck, neck PROM and stretches, MLD as needed, see if chip pack helps at night with CPAP    PT Home Exercise Plan  head and neck stretches; self MLD and wear compression garment daily    Consulted and Agree with Plan of Care  Patient       Patient will benefit from skilled therapeutic intervention in order to improve the following deficits and impairments:  Decreased knowledge of precautions, Decreased range of motion, Increased fascial restricitons, Decreased scar mobility, Decreased knowledge of use of DME, Decreased strength, Postural dysfunction, Pain, Increased edema  Visit Diagnosis: Lymphedema, not elsewhere classified  Stiffness of neck  Abnormal posture  Disorder of the skin and subcutaneous tissue related to radiation, unspecified     Problem List Patient Active Problem List   Diagnosis Date Noted  . Acute on chronic respiratory failure with hypoxia ([place])   .  Saddle embolus of pulmonary artery ([place])   . Acute pulmonary embolism without acute cor pulmonale (HCC)   . Deep vein thrombosis (DVT) of both lower extremities ([place])   . Leukopenia due to antineoplastic chemotherapy ([place])   . Antineoplastic chemotherapy induced anemia   . Dehydration   . LFT elevation   . Mucositis   . Pancytopenia ([place])   . Hypokalemia   . Hyperglycemia 08/21/2018  . Protein malnutrition ([place]) 07/30/2018  . AKI (acute kidney injury) ([place]) 07/23/2018  . Mucositis due to chemotherapy 07/23/2018  . Chemotherapy-induced nausea 07/17/2018  . Chemotherapy-induced diarrhea 07/17/2018  . Oral candidiasis 07/17/2018  . Port-A-Cath in place 07/09/2018  . Sensorineural hearing loss (SNHL) of both ears 06/18/2018  . Odynophagia 06/18/2018  . Malignant neoplasm of base of tongue ([place]) 05/09/2018  . 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 [place], [place], [address] Phone: [phone]   Fax:  [phone]  Name: Glenn Goodman MRN: [id number] Date of Birth: Nov 04, 1960  Manus Gunning, PT 01/22/19 9:18 AM

## 2019-01-29 ENCOUNTER — Ambulatory Visit: Payer: Commercial Managed Care - PPO | Admitting: Rehabilitation

## 2019-02-05 ENCOUNTER — Ambulatory Visit: Payer: Commercial Managed Care - PPO | Admitting: Physical Therapy

## 2019-02-05 ENCOUNTER — Other Ambulatory Visit: Payer: Self-pay

## 2019-02-05 ENCOUNTER — Encounter: Payer: Self-pay | Admitting: Physical Therapy

## 2019-02-05 DIAGNOSIS — I890 Lymphedema, not elsewhere classified: Secondary | ICD-10-CM | POA: Diagnosis not present

## 2019-02-05 DIAGNOSIS — M436 Torticollis: Secondary | ICD-10-CM

## 2019-02-05 DIAGNOSIS — R293 Abnormal posture: Secondary | ICD-10-CM

## 2019-02-05 NOTE — Therapy (Signed)
[place], [place], [address] Phone: [phone]   Fax:  [phone]  Physical Therapy Treatment  Patient Details  Name: Glenn Goodman MRN: [id number] Date of Birth: 09-Jul-1960 Referring Provider (PT): Reita May Date: 02/05/2019  PT End of Session - 02/05/19 1447    Visit Number  2    Number of Visits  5    Date for PT Re-Evaluation  02/19/19    PT Start Time  1403    PT Stop Time  1445    PT Time Calculation (min)  42 min    Activity Tolerance  Patient tolerated treatment well    Behavior During Therapy  [facility] for tasks assessed/performed       Past Medical History:  Diagnosis Date  . Arthritis   . Cancer ([place])   . OSA on CPAP   . Restless leg syndrome     Past Surgical History:  Procedure Laterality Date  . DIRECT LARYNGOSCOPY Left 04/22/2018   Dr. Janace Hoard  . FACIAL FRACTURE SURGERY Left 1986  . IR ANGIOGRAM PULMONARY BILATERAL SELECTIVE  08/22/2018  . IR ANGIOGRAM SELECTIVE EACH ADDITIONAL VESSEL  08/22/2018  . IR ANGIOGRAM SELECTIVE EACH ADDITIONAL VESSEL  08/22/2018  . IR GASTROSTOMY TUBE MOD SED  06/30/2018  . IR GASTROSTOMY TUBE REMOVAL  01/06/2019  . IR IMAGING GUIDED PORT INSERTION  06/30/2018  . IR INFUSION THROMBOL ARTERIAL INITIAL (MS)  08/22/2018  . IR INFUSION THROMBOL ARTERIAL INITIAL (MS)  08/22/2018  . IR REMOVAL TUN ACCESS W/ PORT W/O FL MOD SED  11/28/2018  . IR THROMB F/U EVAL ART/VEN FINAL DAY (MS)  08/23/2018  . IR US GUIDE VASC ACCESS RIGHT  08/22/2018  . MODIFIED RADICAL NECK DISSECTION Left 05/26/2018   Dr. Nicolette Bang, [facility]  . transoral robotic surgery  05/26/2018   TORS, Dr. Nicolette Bang [facility]    There were no vitals filed for this visit.  Subjective Assessment - 02/05/19 1409    Subjective  I had to cancel last week due to a death in the family. I am trying to get exercises in every day. I want to focus on stretches today.    Pertinent History  L base of tongue/tonsillar  sulcus, Stage I, p 16 positive diagnosed on Halloween , TORS  ( surgery in the mouth and thoat with removal of lymph nodes and a section of the back of throat and base of tongue and at same time had external surgery to remove the lymph nodes of the neck 05/26/18, perineural invasion, extranodal extension 1 of 4 L neck nodes, concurrent adjuvant chemo and radiation 06/2018-08/2018 to oropharynx/bilateral neck, no extractions needed, PEG/PAC placement pending, non smoker/drinker In April pt also had hospitalization for Bilateral PTE and LLE DVT and new onset of DM Pt is still using his PEG tube due to severe pain with eating.    Patient Stated Goals  decrease swelling    Currently in Pain?  No/denies    Pain Score  0-No pain                       [facility] Adult PT Treatment/Exercise - 02/05/19 0001      Manual Therapy   Soft tissue mobilization  to left anterior, lateral and posterior neck in area of tightness, had pt turn head to look over right shoulder throughout  extra time spent on SCM, also along bilateral upper traps    Myofascial Release  along  left lateral neck gently using cross hands technique over incision    Passive ROM  Into Rt cervical rotation and side bending                   PT Long Term Goals - 01/22/19 0915      PT LONG TERM GOAL #1   Title  Pt will increase L and R cervical lateral flexion to 31 degrees to allow pt to return to PLOF    Baseline  R 27, L 25    Time  4    Period  Weeks    Status  New    Target Date  02/19/19      PT LONG TERM GOAL #2   Title  Pt will report a 75% improvement in neck and upper shoulder stiffness to allow improved comfort    Time  4    Period  Weeks    Status  New    Target Date  02/19/19      PT LONG TERM GOAL #3   Title  Pt and wife will be independent in self manual lymph drainage for neck for self management    Time  4    Period  Weeks    Status  New    Target Date  02/19/19      PT LONG TERM GOAL #4    Title  Pt will be independent in a home exercise program for continued strengthening and stretching    Time  4    Period  Weeks    Status  New    Target Date  02/19/19            Plan - 02/05/19 1448    Clinical Impression Statement  Pt's wife was unable to attend appointment today to be instructed in MLD technique so will instruct her at next session. Began manual therapy to lateral and posterior neck and upper shoulders to decrease pain and tightness. Pt had numerous knots palpable especially in upper traps, levator and scalenes. His L SCM was tight but improved since he was he during his last episode. Encouraged pt to continue stretching at home.    PT Frequency  1x / week    PT Duration  4 weeks    PT Treatment/Interventions  ADLs/Self Care Home Management;Patient/family education;Therapeutic exercise;Orthotic Fit/Training;Manual lymph drainage;Manual techniques;Scar mobilization;Compression bandaging;Passive range of motion;Taping;Vasopneumatic Device;Joint Manipulations;Therapeutic activities    PT Next Visit Plan  instruct pt's wife in MLD technique if she comes to appt, STM to upper traps and neck, neck PROM and stretches, MLD as needed, see if chip pack helps at night with CPAP    PT Home Exercise Plan  head and neck stretches; self MLD and wear compression garment daily    Consulted and Agree with Plan of Care  Patient       Patient will benefit from skilled therapeutic intervention in order to improve the following deficits and impairments:  Decreased knowledge of precautions, Decreased range of motion, Increased fascial restricitons, Decreased scar mobility, Decreased knowledge of use of DME, Decreased strength, Postural dysfunction, Pain, Increased edema  Visit Diagnosis: Stiffness of neck  Abnormal posture     Problem List Patient Active Problem List   Diagnosis Date Noted  . Acute on chronic respiratory failure with hypoxia ([place])   . Saddle embolus of pulmonary  artery ([place])   . Acute pulmonary embolism without acute cor pulmonale (HCC)   . Deep vein thrombosis (DVT) of both lower  extremities ([place])   . Leukopenia due to antineoplastic chemotherapy ([place])   . Antineoplastic chemotherapy induced anemia   . Dehydration   . LFT elevation   . Mucositis   . Pancytopenia ([place])   . Hypokalemia   . Hyperglycemia 08/21/2018  . Protein malnutrition ([place]) 07/30/2018  . AKI (acute kidney injury) ([place]) 07/23/2018  . Mucositis due to chemotherapy 07/23/2018  . Chemotherapy-induced nausea 07/17/2018  . Chemotherapy-induced diarrhea 07/17/2018  . Oral candidiasis 07/17/2018  . Port-A-Cath in place 07/09/2018  . Sensorineural hearing loss (SNHL) of both ears 06/18/2018  . Odynophagia 06/18/2018  . Malignant neoplasm of base of tongue ([place]) 05/09/2018  . 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

## 2019-02-12 ENCOUNTER — Ambulatory Visit: Payer: Commercial Managed Care - PPO | Admitting: Physical Therapy

## 2019-02-12 ENCOUNTER — Encounter: Payer: Self-pay | Admitting: Physical Therapy

## 2019-02-12 ENCOUNTER — Other Ambulatory Visit: Payer: Self-pay

## 2019-02-12 DIAGNOSIS — R293 Abnormal posture: Secondary | ICD-10-CM

## 2019-02-12 DIAGNOSIS — M6281 Muscle weakness (generalized): Secondary | ICD-10-CM

## 2019-02-12 DIAGNOSIS — M436 Torticollis: Secondary | ICD-10-CM

## 2019-02-12 DIAGNOSIS — I890 Lymphedema, not elsewhere classified: Secondary | ICD-10-CM

## 2019-02-12 DIAGNOSIS — Z483 Aftercare following surgery for neoplasm: Secondary | ICD-10-CM

## 2019-02-12 DIAGNOSIS — L599 Disorder of the skin and subcutaneous tissue related to radiation, unspecified: Secondary | ICD-10-CM

## 2019-02-12 NOTE — Patient Instructions (Signed)
[email or web address] Resources Self care videos MLD for head and neck    Manual lymph drainage for the neck, anterior approach  Sit in front of a mirror. Do 5 slow deep breaths, breathing in through the nose and out through the mouth, letting your belly "inflate" as you breathe in.  Rest your hands on your abdomen as you do this to give slight pressure there.  1) Place hands on areas just behind collar bones and do 10 stationary circles with stretch in outward directions. 2) Do stationary circles at each armpit about 10 times. 3) Place one hand on the front of the opposite shoulder and do stationary circles with stretch downward toward underarm. 4) Repeat #1 above. 5) Imagine a river running in a line from the earlobe straight down the neck.  Place hands just behind this and do circles with stretch coming forward slightly and down, thinking about fluid flowing down that river.  Do 10 times. 6) Place one hand just in front of the river on one side and do circles with a slight back and then downward stretch, thinking again about putting that fluid in the river.  Do 10-20 times on each side. 7) Place one hand just slightly in front of the spot you just did and do the same thing.  DO THIS VERY GENTLY. 8) Use the webspace between your thumb and index finger to pump downward starting just under the chin and "stair stepping" downward with a stretch, working down to the chest. 9) Repeat #1. 10) Do stationary circles on each side of the face just above the chin, out and down with the stretch 10 times. 11) Do stationary circles on each side of the face on the cheeks with pressure going back and down, 10 times. 12) Do stationary circles on each side of the face between the eyes and ears, again back and downward 10 times. 13) Repeat steps 9,8,7,6,5,1,3 and 2 in that order!  Do not slide on the skin, but STRETCH it with your motions. Only give enough pressure to stretch the skin. DO THIS SLOWLY,  PLEASE!  And do once a day.

## 2019-02-12 NOTE — Therapy (Signed)
[place], [place], [address] Phone: [phone]   Fax:  [phone]  Physical Therapy Treatment  Patient Details  Name: Glenn Goodman MRN: [id number] Date of Birth: 1960/11/22 Referring Provider (PT): Reita May Date: 02/12/2019  PT End of Session - 02/12/19 1813    Visit Number  3    Number of Visits  5    Date for PT Re-Evaluation  02/19/19    PT Start Time  1430    PT Stop Time  1515    PT Time Calculation (min)  45 min    Activity Tolerance  Patient tolerated treatment well    Behavior During Therapy  [facility] for tasks assessed/performed       Past Medical History:  Diagnosis Date  . Arthritis   . Cancer ([place])   . OSA on CPAP   . Restless leg syndrome     Past Surgical History:  Procedure Laterality Date  . DIRECT LARYNGOSCOPY Left 04/22/2018   Dr. Janace Hoard  . FACIAL FRACTURE SURGERY Left 1986  . IR ANGIOGRAM PULMONARY BILATERAL SELECTIVE  08/22/2018  . IR ANGIOGRAM SELECTIVE EACH ADDITIONAL VESSEL  08/22/2018  . IR ANGIOGRAM SELECTIVE EACH ADDITIONAL VESSEL  08/22/2018  . IR GASTROSTOMY TUBE MOD SED  06/30/2018  . IR GASTROSTOMY TUBE REMOVAL  01/06/2019  . IR IMAGING GUIDED PORT INSERTION  06/30/2018  . IR INFUSION THROMBOL ARTERIAL INITIAL (MS)  08/22/2018  . IR INFUSION THROMBOL ARTERIAL INITIAL (MS)  08/22/2018  . IR REMOVAL TUN ACCESS W/ PORT W/O FL MOD SED  11/28/2018  . IR THROMB F/U EVAL ART/VEN FINAL DAY (MS)  08/23/2018  . IR US GUIDE VASC ACCESS RIGHT  08/22/2018  . MODIFIED RADICAL NECK DISSECTION Left 05/26/2018   Dr. Nicolette Bang, [facility]  . transoral robotic surgery  05/26/2018   TORS, Dr. Nicolette Bang [facility]    There were no vitals filed for this visit.  Subjective Assessment - 02/12/19 1805    Subjective  Pt comes to PT today with his wife . They feel that they are pretty good with the manual lymph drainage but want to learn other techniques for this neck tightness.  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 "clicking" , but since surgery only his right side "clicks" and that he has pain in his left cheek    Patient is accompained by:  Family member    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 diagnosed on Halloween , TORS  ( surgery in the mouth and thoat with removal of lymph nodes and a section of the back of throat and base of tongue and at same time had external surgery to remove the lymph nodes of the neck 05/26/18, perineural invasion, extranodal extension 1 of 4 L neck nodes, concurrent adjuvant chemo and radiation 06/2018-08/2018 to oropharynx/bilateral neck, no extractions needed, PEG/PAC placement pending, non smoker/drinker In April pt also had hospitalization for Bilateral PTE and LLE DVT and new onset of DM Pt is still using his PEG tube due to severe pain with eating.    Patient Stated Goals  decrease swelling    Currently in Pain?  No/denies                       [facility] Adult PT Treatment/Exercise - 02/12/19 0001      Manual Therapy   Manual therapy  comments  Demonstrated intraoral TMJ distraction on myself and pt was able to repeat it as well as massage to massater muscle with intraoral and extraoral pressure on the muscle and trigger points.      Edema Management  gave written instructions for MLD and briefly reviewed, but pt and wife said they understand this and want to focus on neck stretches     Soft tissue mobilization  demonstrated on left SCM with head turned toward the right     Myofascial Release  along left lateral neck gently using cross hands technique over incision instructed wife in this technique and also explained about using prolonged pressure on trigger points in this area.  She declined to practice, but she verbally said she understood what to do.  ( she is a Marine scientist)              PT Education - 02/12/19 1812    Education Details  myofascial  release, soft tissue work, self MLD, TMJ stretching    Person(s) Educated  Patient;Spouse    Methods  Explanation;Demonstration;Verbal cues;Handout    Comprehension  Verbalized understanding;Returned demonstration          PT Long Term Goals - 01/22/19 0915      PT LONG TERM GOAL #1   Title  Pt will increase L and R cervical lateral flexion to 31 degrees to allow pt to return to PLOF    Baseline  R 27, L 25    Time  4    Period  Weeks    Status  New    Target Date  02/19/19      PT LONG TERM GOAL #2   Title  Pt will report a 75% improvement in neck and upper shoulder stiffness to allow improved comfort    Time  4    Period  Weeks    Status  New    Target Date  02/19/19      PT LONG TERM GOAL #3   Title  Pt and wife will be independent in self manual lymph drainage for neck for self management    Time  4    Period  Weeks    Status  New    Target Date  02/19/19      PT LONG TERM GOAL #4   Title  Pt will be independent in a home exercise program for continued strengthening and stretching    Time  4    Period  Weeks    Status  New    Target Date  02/19/19            Plan - 02/12/19 1814    Clinical Impression Statement  Pt and wife receptive to instruction in myofascial release, soft tissue work and stretching. They feel comfortable with manua lymph drainage and edema management.    Comorbidities  prvious chemo, surgery, radiation , PE and DVT and new onset DM    PT Frequency  1x / week    PT Duration  4 weeks    PT Treatment/Interventions  ADLs/Self Care Home Management;Patient/family education;Therapeutic exercise;Orthotic Fit/Training;Manual lymph drainage;Manual techniques;Scar mobilization;Compression bandaging;Passive range of motion;Taping;Vasopneumatic Device;Joint Manipulations;Therapeutic activities    PT Next Visit Plan  answer questions, myofascial release and  STM to upper traps and neck, neck PROM and stretches, MLD as needed, see if chip pack helps at  night with CPAP follow up with TMJ and intraoral mobilizaion and massage as neede    Consulted and Agree with Plan  of Care  Patient       Patient will benefit from skilled therapeutic intervention in order to improve the following deficits and impairments:     Visit Diagnosis: Stiffness of neck  Abnormal posture  Lymphedema, not elsewhere classified  Disorder of the skin and subcutaneous tissue related to radiation, unspecified  Muscle weakness (generalized)  Aftercare following surgery for neoplasm     Problem List Patient Active Problem List   Diagnosis Date Noted  . Acute on chronic respiratory failure with hypoxia ([place])   . Saddle embolus of pulmonary artery ([place])   . Acute pulmonary embolism without acute cor pulmonale (HCC)   . Deep vein thrombosis (DVT) of both lower extremities ([place])   . Leukopenia due to antineoplastic chemotherapy ([place])   . Antineoplastic chemotherapy induced anemia   . Dehydration   . LFT elevation   . Mucositis   . Pancytopenia ([place])   . Hypokalemia   . Hyperglycemia 08/21/2018  . Protein malnutrition ([place]) 07/30/2018  . AKI (acute kidney injury) ([place]) 07/23/2018  . Mucositis due to chemotherapy 07/23/2018  . Chemotherapy-induced nausea 07/17/2018  . Chemotherapy-induced diarrhea 07/17/2018  . Oral candidiasis 07/17/2018  . Port-A-Cath in place 07/09/2018  . Sensorineural hearing loss (SNHL) of both ears 06/18/2018  . Odynophagia 06/18/2018  . Malignant neoplasm of base of tongue ([place]) 05/09/2018  . 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 Owens Shark PT  Norwood Levo 02/12/2019, 6:17 PM  [place], [place], [address] Phone: [phone]   Fax:  [phone]  Name: Glenn Goodman MRN: [id number] Date of Birth: [birth year]

## 2019-02-13 ENCOUNTER — Telehealth: Payer: Self-pay | Admitting: *Deleted

## 2019-02-13 NOTE — Telephone Encounter (Signed)
Hi Tammi,   Mr. Sowerby can return to see me in mid to late 02/2019 for labs and clinic follow-up.  Thanks.  Dr. Maylon Peppers

## 2019-02-13 NOTE — Telephone Encounter (Signed)
Mr Girten called to see if he is supposed to have a follow up appt with with Dr Maylon Peppers.

## 2019-02-18 ENCOUNTER — Telehealth: Payer: Self-pay | Admitting: Hematology

## 2019-02-18 NOTE — Telephone Encounter (Signed)
Scheduled appt per 9/29 sch message - unable to reach  Pt . Left message with appt date and time

## 2019-02-19 ENCOUNTER — Other Ambulatory Visit: Payer: Self-pay

## 2019-02-19 ENCOUNTER — Ambulatory Visit: Payer: Commercial Managed Care - PPO | Attending: Family Medicine | Admitting: Physical Therapy

## 2019-02-19 ENCOUNTER — Encounter: Payer: Self-pay | Admitting: Physical Therapy

## 2019-02-19 DIAGNOSIS — R1310 Dysphagia, unspecified: Secondary | ICD-10-CM | POA: Diagnosis present

## 2019-02-19 DIAGNOSIS — R293 Abnormal posture: Secondary | ICD-10-CM | POA: Insufficient documentation

## 2019-02-19 DIAGNOSIS — L599 Disorder of the skin and subcutaneous tissue related to radiation, unspecified: Secondary | ICD-10-CM | POA: Insufficient documentation

## 2019-02-19 DIAGNOSIS — M436 Torticollis: Secondary | ICD-10-CM | POA: Diagnosis not present

## 2019-02-19 NOTE — Therapy (Signed)
[place], [place], [address] Phone: [phone]   Fax:  [phone]  Physical Therapy Treatment  Patient Details  Name: Glenn Goodman MRN: [id number] Date of Birth: 17-Aug-1960 Referring Provider (PT): Reita May Date: 02/19/2019  PT End of Session - 02/19/19 1453    Visit Number  4    Number of Visits  5    Date for PT Re-Evaluation  02/19/19    Authorization Type  UMR no auth    PT Start Time  1405    PT Stop Time  1445    PT Time Calculation (min)  40 min    Activity Tolerance  Patient tolerated treatment well    Behavior During Therapy  [facility] for tasks assessed/performed       Past Medical History:  Diagnosis Date  . Arthritis   . Cancer ([place])   . OSA on CPAP   . Restless leg syndrome     Past Surgical History:  Procedure Laterality Date  . DIRECT LARYNGOSCOPY Left 04/22/2018   Dr. Janace Hoard  . FACIAL FRACTURE SURGERY Left 1986  . IR ANGIOGRAM PULMONARY BILATERAL SELECTIVE  08/22/2018  . IR ANGIOGRAM SELECTIVE EACH ADDITIONAL VESSEL  08/22/2018  . IR ANGIOGRAM SELECTIVE EACH ADDITIONAL VESSEL  08/22/2018  . IR GASTROSTOMY TUBE MOD SED  06/30/2018  . IR GASTROSTOMY TUBE REMOVAL  01/06/2019  . IR IMAGING GUIDED PORT INSERTION  06/30/2018  . IR INFUSION THROMBOL ARTERIAL INITIAL (MS)  08/22/2018  . IR INFUSION THROMBOL ARTERIAL INITIAL (MS)  08/22/2018  . IR REMOVAL TUN ACCESS W/ PORT W/O FL MOD SED  11/28/2018  . IR THROMB F/U EVAL ART/VEN FINAL DAY (MS)  08/23/2018  . IR US GUIDE VASC ACCESS RIGHT  08/22/2018  . MODIFIED RADICAL NECK DISSECTION Left 05/26/2018   Dr. Nicolette Bang, [facility]  . transoral robotic surgery  05/26/2018   TORS, Dr. Nicolette Bang [facility]    There were no vitals filed for this visit.  Subjective Assessment - 02/19/19 1407    Subjective  I wanted to take a video of the stretches and MLD review for my wife.    Pertinent History  L base of tongue/tonsillar sulcus, Stage I, p 16  positive diagnosed on Halloween , TORS  ( surgery in the mouth and thoat with removal of lymph nodes and a section of the back of throat and base of tongue and at same time had external surgery to remove the lymph nodes of the neck 05/26/18, perineural invasion, extranodal extension 1 of 4 L neck nodes, concurrent adjuvant chemo and radiation 06/2018-08/2018 to oropharynx/bilateral neck, no extractions needed, PEG/PAC placement pending, non smoker/drinker In April pt also had hospitalization for Bilateral PTE and LLE DVT and new onset of DM Pt is still using his PEG tube due to severe pain with eating.    Patient Stated Goals  decrease swelling    Currently in Pain?  No/denies    Pain Score  0-No pain                       [facility] Adult PT Treatment/Exercise - 02/19/19 0001      Manual Therapy   Soft tissue mobilization  to left anterior, lateral and posterior neck in area of tightness, had pt turn head to look over right shoulder throughout  extra time spent on SCM, also along L upper traps    Myofascial Release  along L SCM using cross hands  PT Long Term Goals - 01/22/19 0915      PT LONG TERM GOAL #1   Title  Pt will increase L and R cervical lateral flexion to 31 degrees to allow pt to return to PLOF    Baseline  R 27, L 25    Time  4    Period  Weeks    Status  New    Target Date  02/19/19      PT LONG TERM GOAL #2   Title  Pt will report a 75% improvement in neck and upper shoulder stiffness to allow improved comfort    Time  4    Period  Weeks    Status  New    Target Date  02/19/19      PT LONG TERM GOAL #3   Title  Pt and wife will be independent in self manual lymph drainage for neck for self management    Time  4    Period  Weeks    Status  New    Target Date  02/19/19      PT LONG TERM GOAL #4   Title  Pt will be independent in a home exercise program for continued strengthening and stretching    Time  4    Period  Weeks     Status  New    Target Date  02/19/19            Plan - 02/19/19 1454    Clinical Impression Statement  Patient continuing to have left neck tightness especially along SCM, upper trap and sub mandibular area. Continued soft tissue mobilization and myofascial work to this area. Pt's wife was unable to attend session to learn these techniques so he video taped session to show to his wife. Next session will decide whether to continue or discharge.    Comorbidities  prvious chemo, surgery, radiation , PE and DVT and new onset DM    PT Frequency  1x / week    PT Duration  4 weeks    PT Treatment/Interventions  ADLs/Self Care Home Management;Patient/family education;Therapeutic exercise;Orthotic Fit/Training;Manual lymph drainage;Manual techniques;Scar mobilization;Compression bandaging;Passive range of motion;Taping;Vasopneumatic Device;Joint Manipulations;Therapeutic activities    PT Next Visit Plan  d/c or recert, answer questions, myofascial release and  STM to upper traps and neck, neck PROM and stretches, MLD as needed, see if chip pack helps at night with CPAP follow up with TMJ and intraoral mobilizaion and massage as neede    PT Home Exercise Plan  head and neck stretches; self MLD and wear compression garment daily    Consulted and Agree with Plan of Care  Patient       Patient will benefit from skilled therapeutic intervention in order to improve the following deficits and impairments:  Decreased knowledge of precautions, Decreased range of motion, Increased fascial restricitons, Decreased scar mobility, Decreased knowledge of use of DME, Decreased strength, Postural dysfunction, Pain, Increased edema  Visit Diagnosis: Stiffness of neck  Abnormal posture  Disorder of the skin and subcutaneous tissue related to radiation, unspecified     Problem List Patient Active Problem List   Diagnosis Date Noted  . Acute on chronic respiratory failure with hypoxia ([place])   . Saddle  embolus of pulmonary artery ([place])   . Acute pulmonary embolism without acute cor pulmonale (HCC)   . Deep vein thrombosis (DVT) of both lower extremities ([place])   . Leukopenia due to antineoplastic chemotherapy ([place])   . Antineoplastic chemotherapy induced anemia   .  Dehydration   . LFT elevation   . Mucositis   . Pancytopenia ([place])   . Hypokalemia   . Hyperglycemia 08/21/2018  . Protein malnutrition ([place]) 07/30/2018  . AKI (acute kidney injury) ([place]) 07/23/2018  . Mucositis due to chemotherapy 07/23/2018  . Chemotherapy-induced nausea 07/17/2018  . Chemotherapy-induced diarrhea 07/17/2018  . Oral candidiasis 07/17/2018  . Port-A-Cath in place 07/09/2018  . Sensorineural hearing loss (SNHL) of both ears 06/18/2018  . Odynophagia 06/18/2018  . Malignant neoplasm of base of tongue ([place]) 05/09/2018  . 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

## 2019-02-20 ENCOUNTER — Telehealth: Payer: Self-pay | Admitting: *Deleted

## 2019-02-20 NOTE — Telephone Encounter (Signed)
Records faxed to [facility] and Neck Cancer Study Suzan Garibaldi - Release [id number]

## 2019-02-25 ENCOUNTER — Telehealth: Payer: Self-pay | Admitting: *Deleted

## 2019-02-25 NOTE — Telephone Encounter (Signed)
On 02-25-19 fax medical records to [facility]  Head and neck cancer study

## 2019-02-26 ENCOUNTER — Encounter: Payer: Commercial Managed Care - PPO | Admitting: Physical Therapy

## 2019-02-27 ENCOUNTER — Encounter: Payer: Self-pay | Admitting: Physical Therapy

## 2019-02-27 ENCOUNTER — Ambulatory Visit: Payer: Commercial Managed Care - PPO | Admitting: Physical Therapy

## 2019-02-27 ENCOUNTER — Other Ambulatory Visit: Payer: Self-pay

## 2019-02-27 DIAGNOSIS — M436 Torticollis: Secondary | ICD-10-CM

## 2019-02-27 DIAGNOSIS — R293 Abnormal posture: Secondary | ICD-10-CM

## 2019-02-27 DIAGNOSIS — L599 Disorder of the skin and subcutaneous tissue related to radiation, unspecified: Secondary | ICD-10-CM

## 2019-02-27 NOTE — Therapy (Signed)
[place], [place], [address] Phone: [phone]   Fax:  [phone]  Physical Therapy Treatment  Patient Details  Name: Glenn Goodman MRN: [id number] Date of Birth: 21-Aug-1960 Referring Provider (PT): Reita May Date: 02/27/2019  PT End of Session - 02/27/19 0852    Visit Number  5    Number of Visits  5    Date for PT Re-Evaluation  02/19/19    Authorization Type  UMR no auth    PT Start Time  0806    PT Stop Time  0849    PT Time Calculation (min)  43 min    Activity Tolerance  Patient tolerated treatment well    Behavior During Therapy  [facility] for tasks assessed/performed       Past Medical History:  Diagnosis Date  . Arthritis   . Cancer ([place])   . OSA on CPAP   . Restless leg syndrome     Past Surgical History:  Procedure Laterality Date  . DIRECT LARYNGOSCOPY Left 04/22/2018   Dr. Janace Hoard  . FACIAL FRACTURE SURGERY Left 1986  . IR ANGIOGRAM PULMONARY BILATERAL SELECTIVE  08/22/2018  . IR ANGIOGRAM SELECTIVE EACH ADDITIONAL VESSEL  08/22/2018  . IR ANGIOGRAM SELECTIVE EACH ADDITIONAL VESSEL  08/22/2018  . IR GASTROSTOMY TUBE MOD SED  06/30/2018  . IR GASTROSTOMY TUBE REMOVAL  01/06/2019  . IR IMAGING GUIDED PORT INSERTION  06/30/2018  . IR INFUSION THROMBOL ARTERIAL INITIAL (MS)  08/22/2018  . IR INFUSION THROMBOL ARTERIAL INITIAL (MS)  08/22/2018  . IR REMOVAL TUN ACCESS W/ PORT W/O FL MOD SED  11/28/2018  . IR THROMB F/U EVAL ART/VEN FINAL DAY (MS)  08/23/2018  . IR US GUIDE VASC ACCESS RIGHT  08/22/2018  . MODIFIED RADICAL NECK DISSECTION Left 05/26/2018   Dr. Nicolette Bang, [facility]  . transoral robotic surgery  05/26/2018   TORS, Dr. Nicolette Bang [facility]    There were no vitals filed for this visit.  Subjective Assessment - 02/27/19 0810    Subjective  My neck is feeling pretty good.    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 diagnosed on Halloween , TORS  (  surgery in the mouth and thoat with removal of lymph nodes and a section of the back of throat and base of tongue and at same time had external surgery to remove the lymph nodes of the neck 05/26/18, perineural invasion, extranodal extension 1 of 4 L neck nodes, concurrent adjuvant chemo and radiation 06/2018-08/2018 to oropharynx/bilateral neck, no extractions needed, PEG/PAC placement pending, non smoker/drinker In April pt also had hospitalization for Bilateral PTE and LLE DVT and new onset of DM Pt is still using his PEG tube due to severe pain with eating.    Patient Stated Goals  decrease swelling    Currently in Pain?  No/denies    Pain Score  0-No pain                       [facility] Adult PT Treatment/Exercise - 02/27/19 0001      Manual Therapy   Edema Management  created new chip pack for pt because old one had stretched out, issued info on how to obtain new TG 7     Soft tissue mobilization  to left anterior, lateral and posterior neck in area of tightness, had pt turn head to look over right shoulder throughout  extra time spent  on SCM, also had pt lie with neck in right lateral flexion to work on L scalenes- instructed wife throughout and she took video to remember how to do massage at home    Myofascial Release  along L SCM using cross hands                   PT Long Term Goals - 02/27/19 4492      PT LONG TERM GOAL #1   Title  Pt will increase L and R cervical lateral flexion to 31 degrees to allow pt to return to PLOF    Baseline  R 27, L 25, 02/27/19- R 35 L 40    Time  4    Period  Weeks    Status  Achieved      PT LONG TERM GOAL #2   Title  Pt will report a 75% improvement in neck and upper shoulder stiffness to allow improved comfort    Baseline  02/27/19- 65%    Time  4    Period  Weeks    Status  Partially Met      PT LONG TERM GOAL #3   Title  Pt and wife will be independent in self manual lymph drainage for neck for self management     Baseline  02/27/19- pt and wife are independent    Time  4    Period  Weeks    Status  Achieved      PT LONG TERM GOAL #4   Title  Pt will be independent in a home exercise program for continued strengthening and stretching    Baseline  02/27/19- pt is indep with this    Time  4    Period  Weeks    Status  Achieved            Plan - 02/27/19 0853    Clinical Impression Statement  Pt brought his wife with him to his appointment today to watch how to do massage and neck stretches. Focused on manual techiques to left SCM and scalenes today in area of tightness. Instructed wife throughout and she took video. Assessed progress towards all goals in therapy. Pt has now met all goals and partially met one goal. He is ready for discharge from skilled PT services at this time.    PT Frequency  1x / week    PT Duration  4 weeks    PT Treatment/Interventions  ADLs/Self Care Home Management;Patient/family education;Therapeutic exercise;Orthotic Fit/Training;Manual lymph drainage;Manual techniques;Scar mobilization;Compression bandaging;Passive range of motion;Taping;Vasopneumatic Device;Joint Manipulations;Therapeutic activities    PT Next Visit Plan  d/c this visit    PT Home Exercise Plan  head and neck stretches; self MLD and wear compression garment daily    Consulted and Agree with Plan of Care  Patient;Family member/caregiver    Family Member Consulted  wife       Patient will benefit from skilled therapeutic intervention in order to improve the following deficits and impairments:  Decreased knowledge of precautions, Decreased range of motion, Increased fascial restricitons, Decreased scar mobility, Decreased knowledge of use of DME, Decreased strength, Postural dysfunction, Pain, Increased edema  Visit Diagnosis: Stiffness of neck  Disorder of the skin and subcutaneous tissue related to radiation, unspecified  Abnormal posture     Problem List Patient Active Problem List    Diagnosis Date Noted  . Acute on chronic respiratory failure with hypoxia ([place])   . Saddle embolus of pulmonary artery ([place])   .  Acute pulmonary embolism without acute cor pulmonale (HCC)   . Deep vein thrombosis (DVT) of both lower extremities ([place])   . Leukopenia due to antineoplastic chemotherapy ([place])   . Antineoplastic chemotherapy induced anemia   . Dehydration   . LFT elevation   . Mucositis   . Pancytopenia ([place])   . Hypokalemia   . Hyperglycemia 08/21/2018  . Protein malnutrition ([place]) 07/30/2018  . AKI (acute kidney injury) ([place]) 07/23/2018  . Mucositis due to chemotherapy 07/23/2018  . Chemotherapy-induced nausea 07/17/2018  . Chemotherapy-induced diarrhea 07/17/2018  . Oral candidiasis 07/17/2018  . Port-A-Cath in place 07/09/2018  . Sensorineural hearing loss (SNHL) of both ears 06/18/2018  . Odynophagia 06/18/2018  . Malignant neoplasm of base of tongue ([place]) 05/09/2018  . 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  Patient goals were met. Patient is being discharged due to meeting the stated rehab goals.  ?????    Allyson Sabal [place], [place] 02/27/19 8:56 AM

## 2019-03-04 ENCOUNTER — Inpatient Hospital Stay (HOSPITAL_BASED_OUTPATIENT_CLINIC_OR_DEPARTMENT_OTHER): Payer: Commercial Managed Care - PPO | Admitting: Hematology

## 2019-03-04 ENCOUNTER — Encounter: Payer: Self-pay | Admitting: Hematology

## 2019-03-04 ENCOUNTER — Inpatient Hospital Stay: Payer: Commercial Managed Care - PPO | Attending: Hematology

## 2019-03-04 ENCOUNTER — Ambulatory Visit: Payer: Commercial Managed Care - PPO | Admitting: Hematology

## 2019-03-04 ENCOUNTER — Other Ambulatory Visit: Payer: Commercial Managed Care - PPO

## 2019-03-04 ENCOUNTER — Other Ambulatory Visit: Payer: Self-pay

## 2019-03-04 VITALS — BP 131/75 | HR 70 | Temp 98.5°F | Resp 17 | Ht 68.0 in | Wt 178.3 lb

## 2019-03-04 DIAGNOSIS — Z86718 Personal history of other venous thrombosis and embolism: Secondary | ICD-10-CM | POA: Insufficient documentation

## 2019-03-04 DIAGNOSIS — C099 Malignant neoplasm of tonsil, unspecified: Secondary | ICD-10-CM

## 2019-03-04 DIAGNOSIS — R1312 Dysphagia, oropharyngeal phase: Secondary | ICD-10-CM | POA: Insufficient documentation

## 2019-03-04 DIAGNOSIS — Z9221 Personal history of antineoplastic chemotherapy: Secondary | ICD-10-CM | POA: Diagnosis not present

## 2019-03-04 DIAGNOSIS — Z79899 Other long term (current) drug therapy: Secondary | ICD-10-CM | POA: Diagnosis not present

## 2019-03-04 DIAGNOSIS — K219 Gastro-esophageal reflux disease without esophagitis: Secondary | ICD-10-CM | POA: Diagnosis not present

## 2019-03-04 DIAGNOSIS — Z794 Long term (current) use of insulin: Secondary | ICD-10-CM | POA: Insufficient documentation

## 2019-03-04 DIAGNOSIS — Z7901 Long term (current) use of anticoagulants: Secondary | ICD-10-CM | POA: Insufficient documentation

## 2019-03-04 DIAGNOSIS — C01 Malignant neoplasm of base of tongue: Secondary | ICD-10-CM | POA: Diagnosis not present

## 2019-03-04 DIAGNOSIS — H9319 Tinnitus, unspecified ear: Secondary | ICD-10-CM | POA: Diagnosis not present

## 2019-03-04 DIAGNOSIS — I82403 Acute embolism and thrombosis of unspecified deep veins of lower extremity, bilateral: Secondary | ICD-10-CM | POA: Diagnosis not present

## 2019-03-04 DIAGNOSIS — I2699 Other pulmonary embolism without acute cor pulmonale: Secondary | ICD-10-CM

## 2019-03-04 LAB — CBC WITH DIFFERENTIAL (CANCER CENTER ONLY)
Abs Immature Granulocytes: 0.02 10*3/uL (ref 0.00–0.07)
Basophils Absolute: 0 10*3/uL (ref 0.0–0.1)
Basophils Relative: 0 %
Eosinophils Absolute: 0.1 10*3/uL (ref 0.0–0.5)
Eosinophils Relative: 1 %
HCT: 44.7 % (ref 39.0–52.0)
Hemoglobin: 14.7 g/dL (ref 13.0–17.0)
Immature Granulocytes: 0 %
Lymphocytes Relative: 11 %
Lymphs Abs: 0.8 10*3/uL (ref 0.7–4.0)
MCH: 30.9 pg (ref 26.0–34.0)
MCHC: 32.9 g/dL (ref 30.0–36.0)
MCV: 93.9 fL (ref 80.0–100.0)
Monocytes Absolute: 0.8 10*3/uL (ref 0.1–1.0)
Monocytes Relative: 12 %
Neutro Abs: 5 10*3/uL (ref 1.7–7.7)
Neutrophils Relative %: 76 %
Platelet Count: 250 10*3/uL (ref 150–400)
RBC: 4.76 MIL/uL (ref 4.22–5.81)
RDW: 12.6 % (ref 11.5–15.5)
WBC Count: 6.7 10*3/uL (ref 4.0–10.5)
nRBC: 0 % (ref 0.0–0.2)

## 2019-03-04 LAB — CMP (CANCER CENTER ONLY)
ALT: 32 U/L (ref 0–44)
AST: 19 U/L (ref 15–41)
Albumin: 3.8 g/dL (ref 3.5–5.0)
Alkaline Phosphatase: 72 U/L (ref 38–126)
Anion gap: 8 (ref 5–15)
BUN: 24 mg/dL — ABNORMAL HIGH (ref 6–20)
CO2: 28 mmol/L (ref 22–32)
Calcium: 9.1 mg/dL (ref 8.9–10.3)
Chloride: 106 mmol/L (ref 98–111)
Creatinine: 0.97 mg/dL (ref 0.61–1.24)
GFR, Est AFR Am: >60 mL/min (ref >60)
GFR, Estimated: >60 mL/min (ref >60)
Glucose, Bld: 87 mg/dL (ref 70–99)
Potassium: 3.8 mmol/L (ref 3.5–5.1)
Sodium: 142 mmol/L (ref 135–145)
Total Bilirubin: 0.5 mg/dL (ref 0.3–1.2)
Total Protein: 6.5 g/dL (ref 6.5–8.1)

## 2019-03-04 LAB — T4, FREE: Free T4: 0.88 ng/dL (ref 0.61–1.12)

## 2019-03-04 NOTE — Progress Notes (Signed)
[place] OFFICE PROGRESS NOTE  Patient Care Team: [name], MD as PCP - General (Family Medicine) Melissa Montane, MD as Consulting Physician (Otolaryngology) Eppie Gibson, MD as Attending Physician (Radiation Oncology) Tish Men, MD as Consulting Physician (Hematology) Leota Sauers, RN as Oncology Nurse Navigator Schin[place], [place], [place] as Speech Language Pathologist (Speech Pathology) Karie Mainland, RD as Dietitian (Nutrition) Wynelle Beckmann, Melodie Bouillon, PT as Physical Therapist (Physical Therapy) Kennith Center, LCSW as Social Worker Francina Ames, MD as Referring Physician (Otolaryngology)  HEME/ONC OVERVIEW: 1. Stage I (pT1pN1M0) left tonsil SCCa, p16+ -02/2018: left Level II and II LN adenopathy with central necrosis (largest 2.4cm) and effacement of the left IJ vein on CT  US-guided bx of left cervical LN; path showed SCCa, p16+   Left tonsil malignancy w/ left Level II disease on PET, no mets -05/2018: TORS by Dr. 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 Bilateral PTE and LLE DVT -08/2018: admitted for syncope; CTA showed bilateral PTE w/ RV strain s/p tPA; doppler positive for acute DVT involving left popliteal, posterior tibial and peroneal veins  -08/2018 - present: Eliquis 49m BID   TREATMENT SUMMARY:  05/26/2018: TORS by Dr. WNicolette Bangat [facility]  07/07/2018 - 08/21/2018: adjuvant chemoradiation with weekly cisplatin [id number] x 6 doses  08/27/2018 - present: Eliquis 57m66mID   ASSESSMENT & PLAN:   Stage I (cT1N1M0) left tonsil SCCa, p16+ -S/p TORS followed by adjuvant chemoRT, completed in 08/2018 -Clinically, patient does not have any evidence of recurrent disease on exam today -Fiberoptic exam normal with  Dr. WalNicolette Goodman 01/2019; next in 05/2019 -Per the patient, Dr. WalNicolette Goodman planning to repeat CT in 08/2019 to monitor any evidence of recurrent disease -I counseled the patient on the importance of jaw, shoulder and swallowing exercises -Periodic TSH and free T4 monitoring (~q6mo32monthBilateral PTE and LLE DVT -S/p tPA for RV strain -Patient is tolerating anticoagulation without any abnormal bleeding or bruising -Goal of anticoagulation is lifelong -Continue Eliquis 57mg 62m   Acid reflux -Possibly due to delayed radiation side effect -Persistent despite famotidine BID  -He is scheduled for EGD and colonoscopy later this year -I encouraged patient to continue follow-up with his gastroenterologist for further management  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 CBC with Differential (Cancer Center Only)    Standing Status:   Future    Standing Expiration Date:   04/07/2020  . CMP ([facility])    Standing Status:   Future    Standing Expiration Date:   04/07/2020  . TSH    Standing Status:   Future    Standing Expiration Date:   03/03/2020   All questions were answered. The patient knows to call the clinic with any problems, questions or concerns. No barriers to learning was detected.  Return in 6 months for labs, clinic appointment, and imaging results.  Glenn Goodman ZTish Men10/14/2020 4:23 PM  CHIEF COMPLAINT: "I am doing okay"  INTERVAL HISTORY: Mr. Glenn Goodman to clinic for follow-up of squamous  cell carcinoma of the left tonsil.  Patient reports that he met with Dr. Nicolette Goodman in early 01/2019, who performed a fiberoptic scope that did not show any evidence of recurrent disease.  His next appointment with  him is in 05/2019.  According to the patient, Dr. Nicolette Goodman is planning to repeat CT neck and chest in 08/2019 to monitor any evidence of disease recurrence.  He has had persistent acid reflux, for which he has been taking Carafate as well as famotidine with modest relief.  He recently met with his gastroenterologist, who plans to perform EGD and colonoscopy later this year.  He is tolerating Eliquis without any abnormal bleeding or bruising.  He denies any other complaint today.  REVIEW OF SYSTEMS:   Constitutional: ( - ) fevers, ( - )  chills , ( - ) night sweats Eyes: ( - ) blurriness of vision, ( - ) double vision, ( - ) watery eyes Ears, nose, mouth, throat, and face: ( - ) mucositis, ( - ) sore throat Respiratory: ( - ) cough, ( - ) dyspnea, ( - ) wheezes Cardiovascular: ( - ) palpitation, ( - ) chest discomfort, ( - ) lower extremity swelling Gastrointestinal:  ( - ) nausea, ( + ) heartburn, ( - ) change in bowel habits Skin: ( - ) abnormal skin rashes Lymphatics: ( - ) new lymphadenopathy, ( - ) easy bruising Neurological: ( - ) numbness, ( - ) tingling, ( - ) new weaknesses Behavioral/Psych: ( - ) mood change, ( - ) new changes  All other systems were reviewed with the patient and are negative.  SUMMARY OF ONCOLOGIC HISTORY: Oncology History  Squamous cell carcinoma of left tonsil ([place])  03/20/2018 Procedure   FNA of the left cervical LN in office   03/20/2018 Pathology Results   Non-diagnostic   04/07/2018 Procedure   US-guided left cervical LN biopsy/FNA    04/07/2018 Pathology Results   (Accession: [id number]) Lymph node, needle/core biopsy, left cervical - METASTATIC SQUAMOUS CELL CARCINOMA INVOLVING A LYMPH NODE (1/1) - SEE COMMENT  p16 IHC positive. EBV ISH negative.    04/22/2018 Pathology Results   (Accession: [id number]) Tonsil, biopsy, left, base of tongue - INVASIVE KERATINIZING SQUAMOUS CELL CARCINOMA.   04/29/2018 Imaging   PET:  IMPRESSION: 1. Left  palatine tonsil/adjacent pharyngeal mucosal space lesion is asymmetric, measures about 1.3 cm in long axis, and has a maximum SUV of 8.2, compatible with malignancy. There is associated hypermetabolic left level IIa adenopathy as shown on prior exams. 2. Symmetric accentuated activity in the lymphoid tissue at the tongue base, maximum SUV 5.3, quite likely physiologic given the symmetry.  3. No findings of metastatic disease Pred to the chest, abdomen/pelvis, or skeleton.   05/26/2018 Surgery   Radical tonsillectomy   05/26/2018 Pathology Results   PROCEDURE: Radical tonsillectomy TUMOR SITE: Glossotonsillar sulcus TUMOR LATERALITY: LEFT TUMOR FOCALITY: Unifocal TUMOR SIZE: GREATEST DIMENSION: 1.6 cm HISTOLOGIC TYPE: HPV-mediated squamous cell carcinoma MARGINS: Involved by invasive tumor (focal) Specify location of closest margin: Tongue base (soft tissue) Note: Although an additional tongue base mucosal margin was submitted, location of tumor extension to margin appears 1 cm from the mucosal surface. LYMPHOVASCULAR INVASION: Not identified PERINEURAL INVASION: Present REGIONAL LYMPH NODES:  NUMBER OF LYMPH NODES INVOLVED: 1  NUMBER OF LYMPH NODES EXAMINED: 6  LATERALITY OF LYMPH NODES INVOLVED: Ipsilateral  SIZE OF LARGEST METASTATIC DEPOSIT: 6.8 cm  EXTRANODAL EXTENSION: Present   Distance from lymph node capsule: 6 millimeters PATHOLOGIC STAGE CLASSIFICATION (  pTNM, AJCC 8TH Ed): pT1 pN1 (HPV-mediated oropharynx tumor)  pT1: Tumor 2 cm or smaller in greatest dimension  pN1: Metastasis in 4 or fewer lymph nodes ANCILLARY STUDIES:  p16 expression by immunohistochemistry: Positive  HPV by PCR: pending   05/26/2018 Cancer Staging   Staging form: Pharynx - HPV-Mediated Oropharynx, AJCC 8th Edition - Pathologic stage from 05/26/2018: Stage I (pT1, pN1, cM0, p16+) - Signed by Tish Men, MD on 06/18/2018   07/10/2018 - 07/10/2018 Chemotherapy   The patient  had palonosetron (ALOXI) injection 0.25 mg, 0.25 mg, Intravenous,  Once, 0 of 3 cycles CISplatin (PLATINOL) 198 mg in sodium chloride 0.9 % 500 mL chemo infusion, 100 mg/m2 = 198 mg, Intravenous,  Once, 0 of 3 cycles fosaprepitant (EMEND) 150 mg, dexamethasone (DECADRON) 12 mg in sodium chloride 0.9 % 145 mL IVPB, , Intravenous,  Once, 0 of 3 cycles  for chemotherapy treatment.    07/10/2018 -  Chemotherapy   The patient had palonosetron (ALOXI) injection 0.25 mg, 0.25 mg, Intravenous,  Once, 6 of 6 cycles Administration: 0.25 mg (07/10/2018), 0.25 mg (07/18/2018), 0.25 mg (07/24/2018), 0.25 mg (07/31/2018), 0.25 mg (08/07/2018), 0.25 mg (08/14/2018) CISplatin (PLATINOL) 79 mg in sodium chloride 0.9 % 250 mL chemo infusion, 40 mg/m2 = 79 mg, Intravenous,  Once, 6 of 6 cycles Administration: 79 mg (07/10/2018), 79 mg (07/18/2018), 79 mg (07/24/2018), 79 mg (07/31/2018), 79 mg (08/07/2018), 79 mg (08/14/2018) fosaprepitant (EMEND) 150 mg, dexamethasone (DECADRON) 12 mg in sodium chloride 0.9 % 145 mL IVPB, , Intravenous,  Once, 6 of 6 cycles Administration:  (07/10/2018),  (07/18/2018),  (07/24/2018),  (07/31/2018),  (08/07/2018),  (08/14/2018)  for chemotherapy treatment.    10/20/2018 Imaging   CT neck w/ contrast: IMPRESSION: Interval left tonsillectomy and neck dissection. No evidence of residual mass in the left tonsillar region. Postsurgical distortion of the left neck, without evidence of residual or recurrent tumor.  I would categorize this as NI RADS 1 based on the CT, but this could be altered based on follow-up PET.   10/20/2018 Imaging   CT chest w/ contrast: IMPRESSION: No evidence of metastatic disease in the chest.   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[place])  05/09/2018 Initial Diagnosis   Malignant neoplasm of base of tongue ([place])   05/09/2018 Cancer Staging   Staging form: Pharynx - HPV-Mediated Oropharynx, AJCC 8th Edition - Clinical: Stage I (cT1, cN1, cM0, p16+) - Signed by Eppie Gibson, MD on  05/09/2018   06/11/2018 Cancer Staging   Staging form: Pharynx - HPV-Mediated Oropharynx, AJCC 8th Edition - Pathologic: Stage I (pT1, pN1, cM0, p16+) - Signed by Eppie Gibson, MD on 06/11/2018     I have reviewed the past medical history, past surgical history, social history and family history with the patient and they are unchanged from previous note.  ALLERGIES:  has No Known Allergies.  MEDICATIONS:  Current Outpatient Medications  Medication Sig Dispense Refill  . pramipexole (MIRAPEX) 1 MG tablet Take 1 mg by mouth at bedtime.    . sodium fluoride (PREVIDENT 5000 PLUS) 1.1 % CREA dental cream Place 1 application onto teeth at bedtime. Apply to tooth brush. Brush teeth for 2 minutes. Spit out excess. DO NOT rinse afterwards. Repeat nightly.    . Acetaminophen (TYLENOL PO) Take by mouth.    Marland Kitchen apixaban (ELIQUIS) 5 MG TABS tablet 38m po bid for 7 days then 579mpo bid , for 30days supply. 60 tablet 0  . blood glucose meter kit and supplies KIT Dispense based on patient  and insurance preference. Use up to four times daily as directed. (FOR ICD-9 250.00, 250.01). 1 each 0  . FAMOTIDINE PO Take 10 mg by mouth 2 (two) times daily.     . insulin glargine (LANTUS) 100 unit/mL SOPN Inject 0.18 mLs (18 Units total) into the skin at bedtime for 30 days. 15 mL 0  . insulin lispro (HUMALOG KWIKPEN) 100 UNIT/ML KwikPen Before each meal 3 times a day 140-199 - 2 units; 200-250  4units; 251-299  6 units;  300-349 - 8 units; 350 or above 10 units. (Patient not taking: Reported on 03/04/2019) 15 mL 0  . Insulin Pen Needle (PEN NEEDLES 3/16") 31G X 5 MM MISC For insulin injection. (Patient not taking: Reported on 03/04/2019) 100 each 0  . lidocaine (XYLOCAINE) 2 % solution Patient: Mix 1part 2% viscous lidocaine, 1part H20. Swish & swallow 19m of diluted mixture, 361m before meals and at bedtime, up to QID (Patient not taking: Reported on 03/04/2019) 100 mL 5  . LORazepam (ATIVAN) 0.5 MG tablet Take 1  tablet (0.5 mg total) by mouth every 6 (six) hours as needed (Nausea or vomiting). (Patient not taking: Reported on 03/04/2019) 30 tablet 0  . metFORMIN (GLUCOPHAGE) 500 MG tablet Take 1 tablet (500 mg total) by mouth 2 (two) times daily with a meal. (Patient not taking: Reported on 03/04/2019) 60 tablet 0  . morphine (MSIR) 15 MG tablet Take 1 tablet (15 mg total) by mouth every 4 (four) hours as needed for severe pain. (Patient not taking: Reported on 03/04/2019) 60 tablet 0  . ondansetron (ZOFRAN) 8 MG tablet Take 1 tablet (8 mg total) by mouth 2 (two) times daily as needed. Start on the third day after chemotherapy. (Patient not taking: Reported on 03/04/2019) 30 tablet 1  . silver sulfADIAZINE (SILVADENE) 1 % cream Apply 1 application topically 2 (two) times daily.    . sucralfate (CARAFATE) 1 GM/10ML suspension TAKE 10 MLS (1 G TOTAL) BY MOUTH 4 (FOUR) TIMES DAILY - WITH MEALS AND AT BEDTIME FOR 30 DAYS. 420 mL 2   No current facility-administered medications for this visit.    Facility-Administered Medications Ordered in Other Visits  Medication Dose Route Frequency Provider Last Rate Last Dose  . heparin lock flush 100 unit/mL  500 Units Intravenous Once Ennever, PeRudell CobbMD      . sodium chloride flush (NS) 0.9 % injection 10 mL  10 mL Intravenous PRN Ennever, PeRudell CobbMD        PHYSICAL EXAMINATION: ECOG PERFORMANCE STATUS: 1 - Symptomatic but completely ambulatory  Today's Vitals   03/04/19 1546 03/04/19 1550  BP: 131/75   Pulse: 70   Resp: 17   Temp: 98.5 F (36.9 C)   TempSrc: Temporal   SpO2: 99%   Weight: 178 lb 4.8 oz (80.9 kg)   Height: 5' 8"  (1.727 m)   PainSc:  0-No pain   Body mass index is 27.11 kg/m.  Filed Weights   03/04/19 1546  Weight: 178 lb 4.8 oz (80.9 kg)    GENERAL: alert, no distress and comfortable SKIN: skin color, texture, turgor are normal, no rashes or significant lesions EYES: conjunctiva are pink and non-injected, sclera  clear OROPHARYNX: no exudate, no erythema; lips, buccal mucosa, and tongue normal  NECK: slightly firm neck with some scar tissue on the left neck LYMPH:  no palpable lymphadenopathy in the cervical LUNGS: clear to auscultation with normal breathing effort HEART: regular rate & rhythm and no murmurs and no  lower extremity edema ABDOMEN: soft, non-tender, non-distended, normal bowel sounds Musculoskeletal: no cyanosis of digits and no clubbing  PSYCH: alert & oriented x 3, fluent speech  LABORATORY DATA:  I have reviewed the data as listed    Component Value Date/Time   NA 139 10/23/2018 1010   K 3.6 10/23/2018 1010   CL 102 10/23/2018 1010   CO2 29 10/23/2018 1010   GLUCOSE 125 (H) 10/23/2018 1010   BUN 22 (H) 10/23/2018 1010   CREATININE 0.72 10/23/2018 1010   CALCIUM 9.2 10/23/2018 1010   PROT 6.5 10/23/2018 1010   ALBUMIN 4.4 10/23/2018 1010   AST 14 (L) 10/23/2018 1010   ALT 22 10/23/2018 1010   ALKPHOS 75 10/23/2018 1010   BILITOT 0.3 10/23/2018 1010   GFRNONAA >60 10/23/2018 1010   GFRAA >60 10/23/2018 1010    No results found for: SPEP, UPEP  Lab Results  Component Value Date   WBC 8.1 11/28/2018   NEUTROABS 4.3 10/23/2018   HGB 15.5 11/28/2018   HCT 48.5 11/28/2018   MCV 100.6 (H) 11/28/2018   PLT 317 11/28/2018      Chemistry      Component Value Date/Time   NA 139 10/23/2018 1010   K 3.6 10/23/2018 1010   CL 102 10/23/2018 1010   CO2 29 10/23/2018 1010   BUN 22 (H) 10/23/2018 1010   CREATININE 0.72 10/23/2018 1010      Component Value Date/Time   CALCIUM 9.2 10/23/2018 1010   ALKPHOS 75 10/23/2018 1010   AST 14 (L) 10/23/2018 1010   ALT 22 10/23/2018 1010   BILITOT 0.3 10/23/2018 1010       RADIOGRAPHIC STUDIES: I have personally reviewed the radiological images as listed below and agreed with the findings in the report. No results found.

## 2019-03-05 ENCOUNTER — Telehealth: Payer: Self-pay | Admitting: Hematology

## 2019-03-05 LAB — TSH: TSH: 3.182 u[IU]/mL (ref 0.320–4.118)

## 2019-03-05 NOTE — Telephone Encounter (Signed)
Called and spoke with patient regarding follow up appointment added per 10/14 staff/los msg

## 2019-03-09 ENCOUNTER — Ambulatory Visit: Payer: Commercial Managed Care - PPO

## 2019-03-09 DIAGNOSIS — R1310 Dysphagia, unspecified: Secondary | ICD-10-CM

## 2019-03-09 DIAGNOSIS — M436 Torticollis: Secondary | ICD-10-CM | POA: Diagnosis not present

## 2019-03-10 NOTE — Patient Instructions (Signed)
You must continue to do the exercises 2-3 times per week. If you have more difficulty with food passing, or coughing/throat clearing/choking you will need to contact your ENT or PCP for a swallow eval with a speech therapist.

## 2019-03-10 NOTE — Therapy (Signed)
Glenn Goodman [address] [place], [place], [address] Phone: [phone]   Fax:  [phone]  Speech Language Pathology Treatment/Discharge summary  Patient Details  Name: Glenn Goodman MRN: [id number] Date of Birth: 1960/07/26 Referring Provider (SLP): Eppie Gibson, MD   Encounter Date: 03/09/2019  End of Session - 03/10/19 0842    Visit Number  8    Number of Visits  10    Date for SLP Re-Evaluation  03/13/19    SLP Start Time  8921    SLP Stop Time   1941    SLP Time Calculation (min)  40 min    Activity Tolerance  Patient tolerated treatment well       Past Medical History:  Diagnosis Date  . Arthritis   . Cancer ([place])   . OSA on CPAP   . Restless leg syndrome     Past Surgical History:  Procedure Laterality Date  . DIRECT LARYNGOSCOPY Left 04/22/2018   Dr. Janace Hoard  . FACIAL FRACTURE SURGERY Left 1986  . IR ANGIOGRAM PULMONARY BILATERAL SELECTIVE  08/22/2018  . IR ANGIOGRAM SELECTIVE EACH ADDITIONAL VESSEL  08/22/2018  . IR ANGIOGRAM SELECTIVE EACH ADDITIONAL VESSEL  08/22/2018  . IR GASTROSTOMY TUBE MOD SED  06/30/2018  . IR GASTROSTOMY TUBE REMOVAL  01/06/2019  . IR IMAGING GUIDED PORT INSERTION  06/30/2018  . IR INFUSION THROMBOL ARTERIAL INITIAL (MS)  08/22/2018  . IR INFUSION THROMBOL ARTERIAL INITIAL (MS)  08/22/2018  . IR REMOVAL TUN ACCESS W/ PORT W/O FL MOD SED  11/28/2018  . IR THROMB F/U EVAL ART/VEN FINAL DAY (MS)  08/23/2018  . IR US GUIDE VASC ACCESS RIGHT  08/22/2018  . MODIFIED RADICAL NECK DISSECTION Left 05/26/2018   Dr. Nicolette Bang, [facility]  . transoral robotic surgery  05/26/2018   TORS, Dr. Nicolette Bang [facility]    There were no vitals filed for this visit.  Subjective Assessment - 03/09/19 1408    Subjective  "I stay away from most of the dry things." Dense foods are also challenging at times (e.g., chicken breast)"    Currently in Pain?  No/denies            ADULT SLP TREATMENT - 03/10/19 0001       General Information   Behavior/Cognition  Alert;Cooperative;Pleasant mood      Treatment Provided   Treatment provided  Dysphagia      Dysphagia Treatment   Respiratory Status  Room air    Oral Cavity - Dentition  Adequate natural dentition    Treatment Methods  Skilled observation;Therapeutic exercise;Patient/caregiver education    Patient observed directly with PO's  Yes    Type of PO's observed  Regular;Thin liquids    Liquids provided via  --   bottle   Oral Phase Signs & Symptoms  --   none   Pharyngeal Phase Signs & Symptoms  --   none   Amount of cueing  Independent    Other treatment/comments  Pt was independent with HEP - had scope of HEP memorized, procedure was WNL. Pt is eating regular diet except for "dense" items which he cuts into smaller pieces than prior to rad tx. "Dry" items require more H2O but pt reports pharyngeal clearance as WNL using this strategy.       Assessment / Recommendations / Plan   Plan  Discharge SLP treatment due to (comment)   pt met goals/satisfied with progress     Progression Toward Goals   Progression  toward goals  Goals met, education completed, patient discharged from [facility] - 01/05/19 Register #1   Title  pt will complete HEP with rare min A over two sessions    Status  Achieved      SLP SHORT TERM GOAL #2   Title  pt will tell SLP why he is completing HEP     Status  Achieved   time period - 2 sessions     SLP SHORT TERM GOAL #3   Title  pt will tell SLP how a food journal can facilitate return to most normal diet    Status  Achieved       SLP Long Term Goals - 03/10/19 0844      SLP LONG TERM GOAL #1   Title  pt will complete HEP with modified independence x3 sessions    Baseline  07-06-18, 10-06-18    Status  Achieved      SLP LONG TERM GOAL #2   Title  pt will tell SLP 3 overt s/s of aspiration PNA with modified independence    Time  --   visit #5   Status   Achieved      SLP LONG TERM GOAL #3   Title  pt will report to SLP when he can change frequency of HEP to 2-3x/week x2 visits    Baseline  11-03-18, 03-09-19    Status  Achieved      SLP LONG TERM GOAL #4   Title  pt will demo super-supraglottic swallow x2 sessions with modified independence    Baseline  03-09-19    Status  Partially Met       Plan - 03/10/19 0843    Clinical Impression Statement  Visit took place via Webex, per medical director's directive (Dr. Eppie Gibson) due to COVID-19 pandemic. Pt with oropharyngeal swallowing as observed today WNL with regular diet (chips) and thin liquids. Pt has consistency with HEP completion and accuracy, and PO safety. See "other treatment/comments" for more details. Pt does not report overt s/s aspiration PNA today. The probability of swallowing difficulty increases dramatically with the pt history of chemo and radiation therapy. Pt will be d/c'd at this time and he agrees with this.    Treatment/Interventions  Aspiration precaution training;Diet toleration management by SLP;Pharyngeal strengthening exercises;Internal/external aids;Trials of upgraded texture/liquids;Compensatory strategies;Patient/family education;SLP instruction and feedback;Cueing hierarchy;Environmental controls    Potential to Achieve Goals  Good    SLP Home Exercise Plan  provided today    Consulted and Agree with Plan of Care  Patient       Patient will benefit from skilled therapeutic intervention in order to improve the following deficits and impairments:   Dysphagia, unspecified type   SPEECH THERAPY DISCHARGE SUMMARY  Visits from Start of Care: 8  Current functional level related to goals / functional outcomes: Pt has met all or partially met all goals. See above for details.    Remaining deficits: Mild dysphagia with more "dense" foods, and drier foods (possibly partly due to xerostomia).   Education / Equipment: HEP, late effects head/neck radiation on  swallow ability   Plan: Patient agrees to discharge.  Patient goals were partially met. Patient is being discharged due to meeting the stated rehab goals.  ?????       Problem List Patient Active Problem List   Diagnosis Date Noted  . Oropharyngeal  dysphagia 03/04/2019  . Acute on chronic respiratory failure with hypoxia ([place])   . Saddle embolus of pulmonary artery ([place])   . Acute pulmonary embolism without acute cor pulmonale (H[facility])   . Deep vein thrombosis (DVT) of both lower extremities ([place])   . Dehydration   . LFT elevation   . Mucositis   . Pancytopenia ([place])   . Hypokalemia   . Hyperglycemia 08/21/2018  . AKI (acute kidney injury) ([place]) 07/23/2018  . Port-A-Cath in place 07/09/2018  . Sensorineural hearing loss (SNHL) of both ears 06/18/2018  . Odynophagia 06/18/2018  . 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 Squamous cell carcinoma of left tonsil ([place]) 04/29/2018  . 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 Lymphadenopathy 03/13/2018  . Primary osteoarthritis of first carpometacarpal joint of left hand 05/10/2017    [facility] ,MS, CCC-SLP  03/10/2019, 8:46 AM  [facility] [address] [place], [place], [address] Phone: [phone]   Fax:  [phone]   Name: Glenn Goodman MRN: [id number] Date of Birth: 02/15/1961

## 2019-06-11 IMAGING — XA IR FLUORO GUIDE CV LINE*L*
1 series · 1 of 1 positions shown · IV contrast (IODINE)
Comparison: PET-CT-04/29/2018

INDICATION: History of head neck cancer. In need of durable intravenous access
for chemotherapy administration.

EXAM:
IMPLANTED PORT A CATH PLACEMENT WITH ULTRASOUND AND FLUOROSCOPIC
GUIDANCE

[Series 300: ir imaging guided port insertion · 1 of 1 slices shown]
[im 1/1]
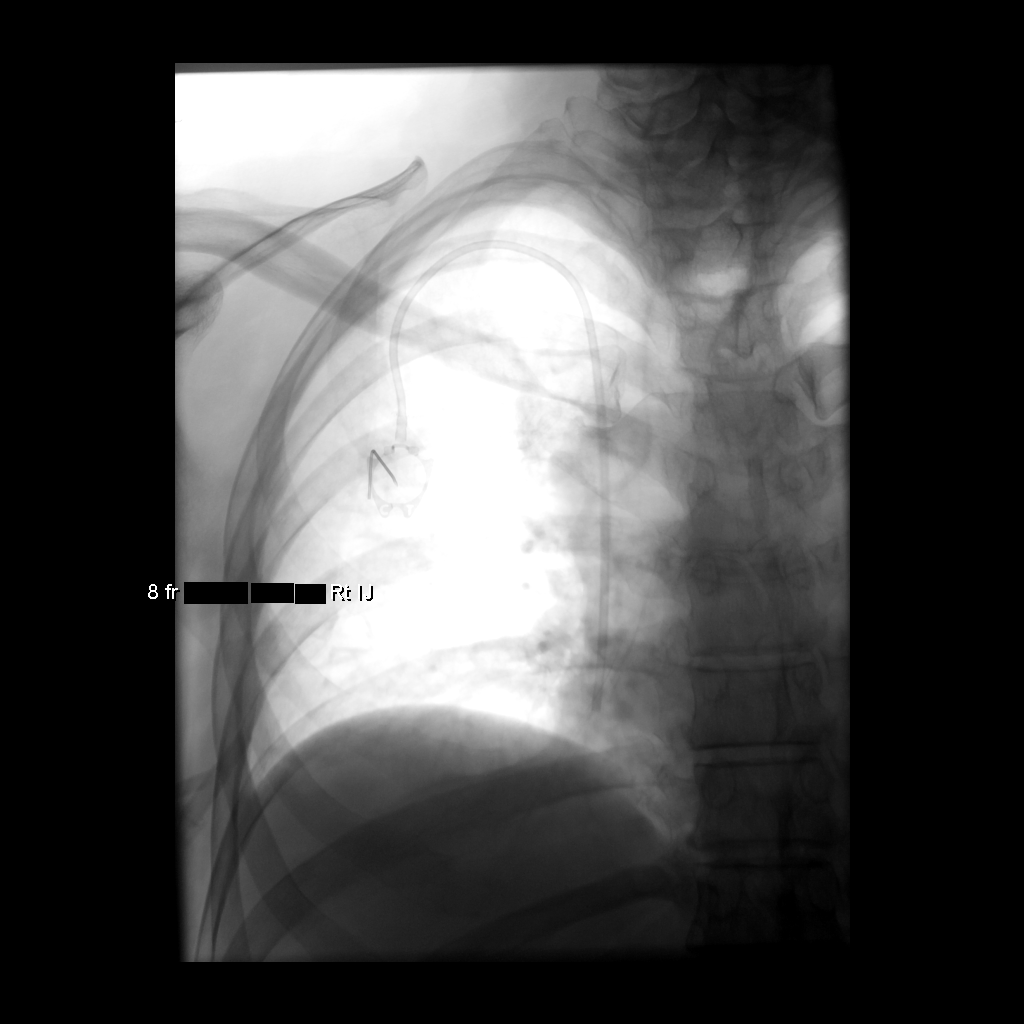

[1 of 1 positions shown; findings below may reference images not displayed]

MEDICATIONS:
Ancef 2 gm IV; The antibiotic was administered within an appropriate
time interval prior to skin puncture.

ANESTHESIA/SEDATION:
Moderate (conscious) sedation was employed during this procedure. A
total of Versed 3 mg and Fentanyl 100 mcg was administered
intravenously.

Moderate Sedation Time: 24 minutes. The patient's level of
consciousness and vital signs were monitored continuously by
radiology nursing throughout the procedure under my direct
supervision.

CONTRAST:  None

FLUOROSCOPY TIME:  30 seconds (11 mGy)

COMPLICATIONS:
None immediate.

PROCEDURE:
The procedure, risks, benefits, and alternatives were explained to
the patient. Questions regarding the procedure were encouraged and
answered. The patient understands and consents to the procedure.

The right neck and chest were prepped with chlorhexidine in a
sterile fashion, and a sterile drape was applied covering the
operative field. Maximum barrier sterile technique with sterile
gowns and gloves were used for the procedure. A timeout was
performed prior to the initiation of the procedure. Local anesthesia
was provided with 1% lidocaine with epinephrine.

After creating a small venotomy incision, a micropuncture kit was
utilized to access the internal jugular vein. Real-time ultrasound
guidance was utilized for vascular access including the acquisition
of a permanent ultrasound image documenting patency of the accessed
vessel. The microwire was utilized to measure appropriate catheter
length.

A subcutaneous port pocket was then created along the upper chest
wall utilizing a combination of sharp and blunt dissection. The
pocket was irrigated with sterile saline. A single lumen thin power
injectable port was chosen for placement. The 8 Fr catheter was
tunneled from the port pocket site to the venotomy incision. The
port was placed in the pocket. The external catheter was trimmed to
appropriate length. At the venotomy, an 8 Fr peel-away sheath was
placed over a guidewire under fluoroscopic guidance. The catheter
was then placed through the sheath and the sheath was removed. Final
catheter positioning was confirmed and documented with a
fluoroscopic spot radiograph. The port was accessed with Blade Aujla
needle, aspirated and flushed with heparinized saline.

The venotomy site was closed with an interrupted 4-0 Vicryl suture.
The port pocket incision was closed with interrupted 2-0 Vicryl
suture and the skin was opposed with a running subcuticular 4-0
Vicryl suture. Dermabond and Tah[place] were applied to both
incisions. Dressings were placed. The patient tolerated the
procedure well without immediate post procedural complication.
FINDINGS: After catheter placement, the tip lies within the superior
cavoatrial junction. The catheter aspirates and flushes normally and
is ready for immediate use.
IMPRESSION: Successful placement of a right internal jugular approach power
injectable Port-A-Cath. The catheter is ready for immediate use.

## 2019-08-02 IMAGING — DX PORTABLE CHEST - 1 VIEW
1 series · 1 of 1 positions shown · non-contrast
Comparison: None.

CLINICAL DATA: Head neck cancer. Hyperglycemia.

EXAM:
PORTABLE CHEST 1 VIEW

[chest ap]
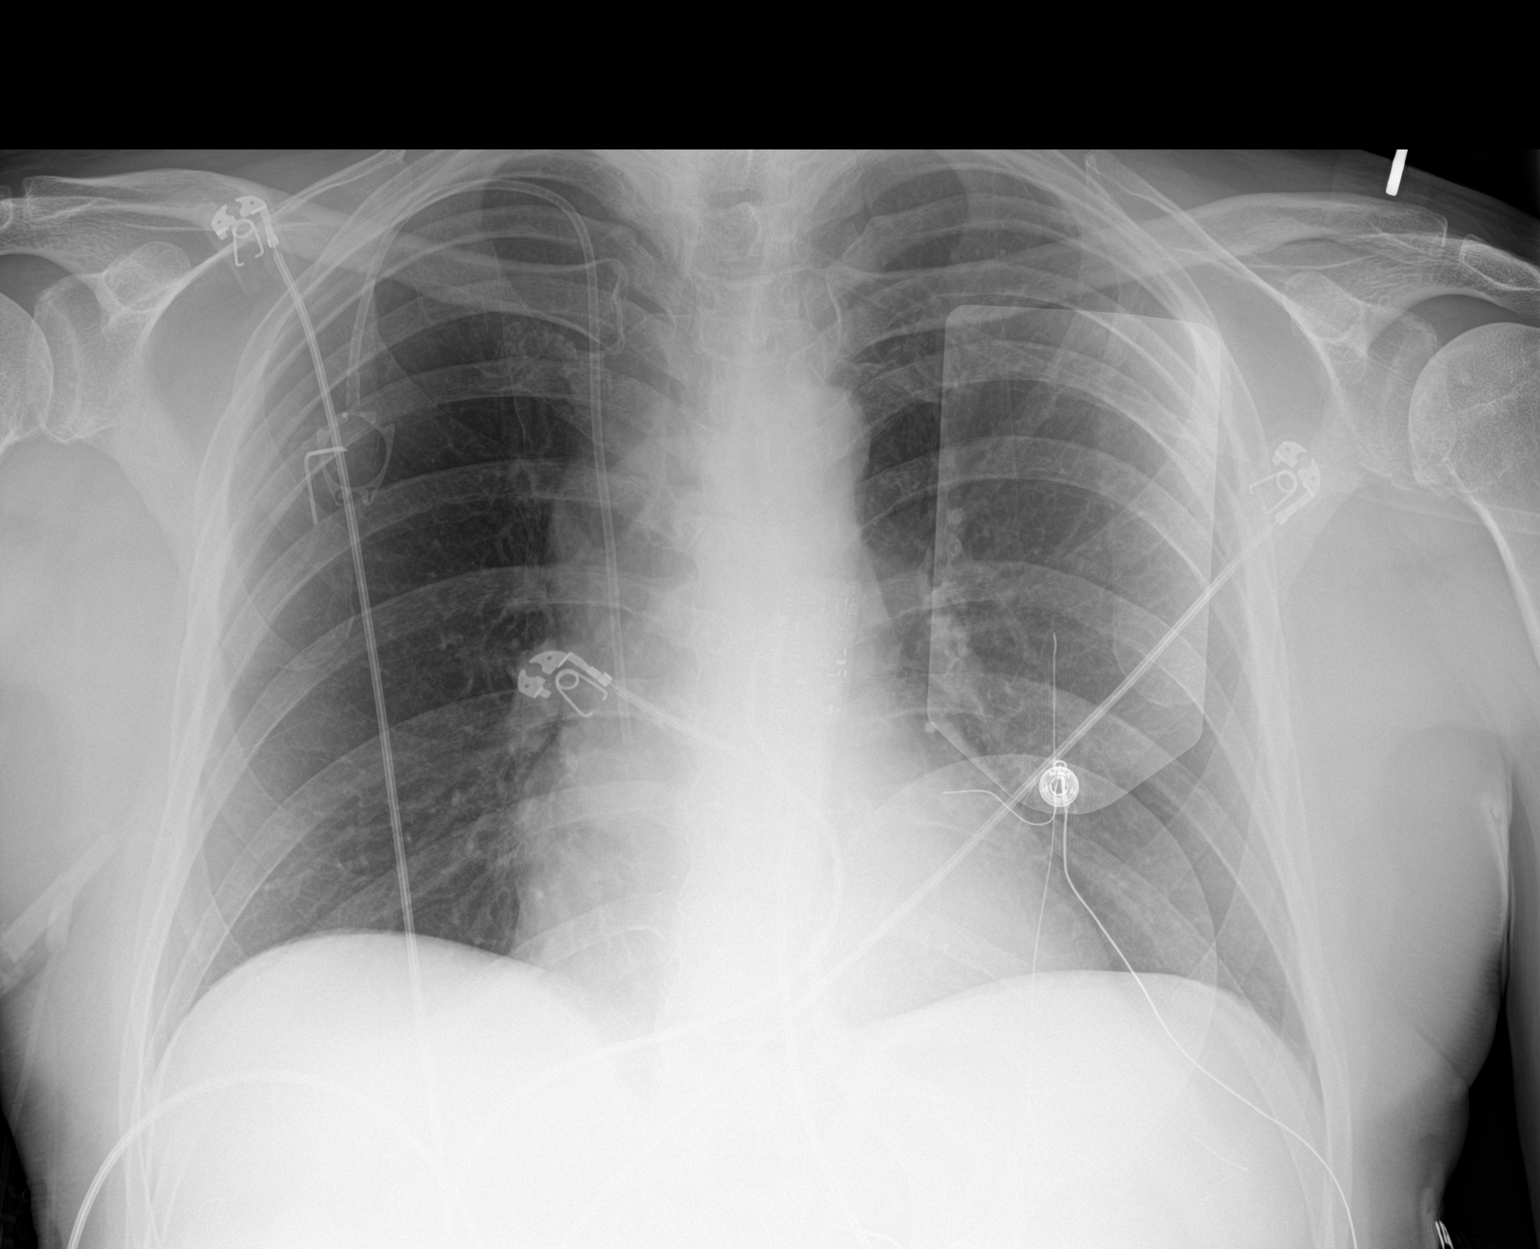

[1 of 1 positions shown; findings below may reference images not displayed]

FINDINGS: The heart size and mediastinal contours are within normal limits.
Both lungs are clear. The visualized skeletal structures are
unremarkable. Port-A-Cath tip proximal RIGHT atrium.
IMPRESSION: No active disease.

## 2019-08-03 IMAGING — XA BILATERAL PULMONARY ARTERIOGRAPHY
1 series · 13 of 13 positions shown · IV contrast (IODINE)
Comparison: Chest CTA-08/22/2018

INDICATION: History of head neck cancer, now with bilateral sub massive
pulmonary embolism. Request made for placement of bilateral infusion
catheters for the initiation pulmonary arterial thrombolysis

EXAM:
1. ULTRASOUND GUIDANCE FOR VENOUS ACCESS X2
2. PULMONARY ARTERIOGRAPHY
3. FLUOROSCOPIC GUIDED PLACEMENT OF BILATERAL PULMONARY ARTERIAL
LYTIC INFUSION CATHETERS
TECHNIQUE: Informed written consent was obtained from the patient after a
discussion of the risks, benefits and alternatives to treatment.
Questions regarding the procedure were encouraged and answered. A
timeout was performed prior to the initiation of the procedure.

[Series 300: dsa body · 13 of 13 slices shown]
[im 1/13]
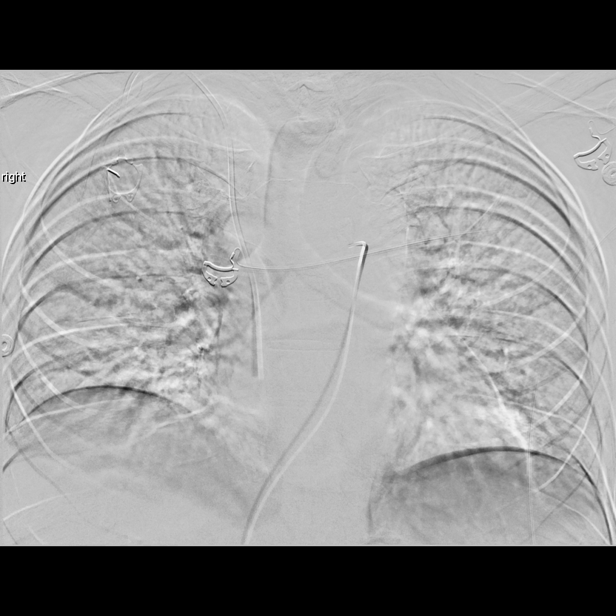
[im 2/13]
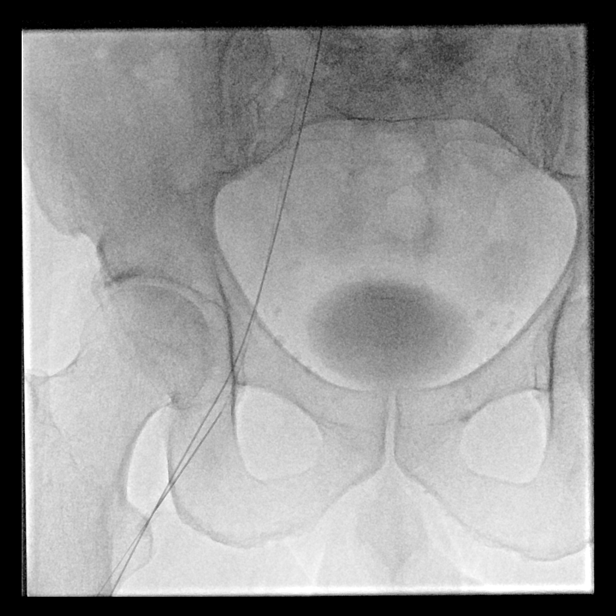
[im 3/13]
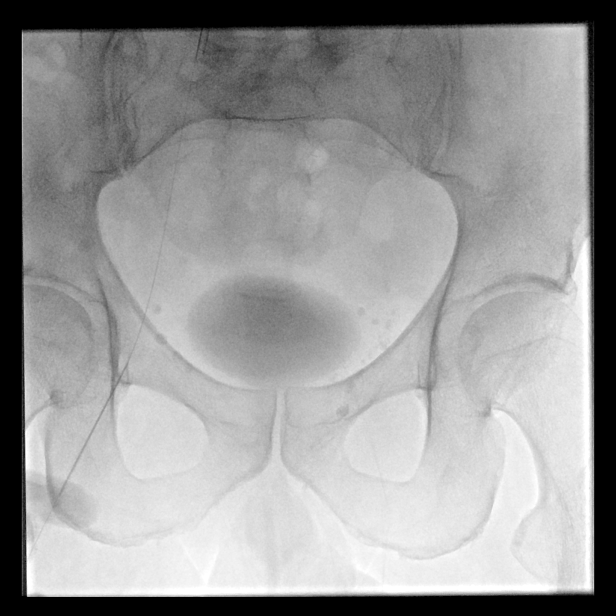
[im 4/13]
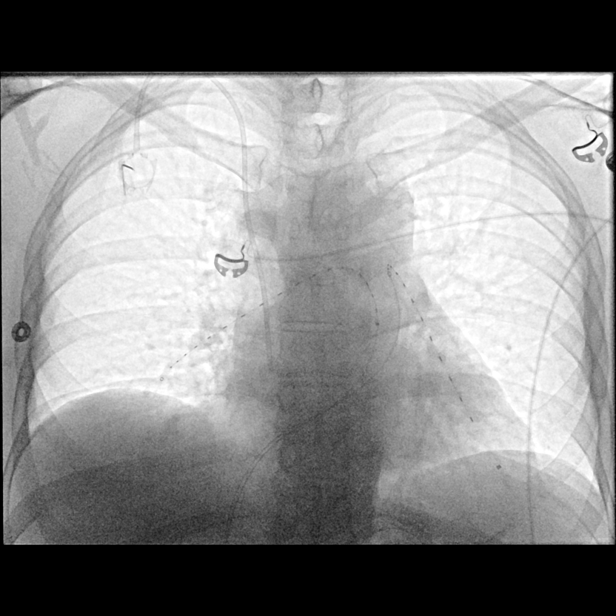
[im 5/13]
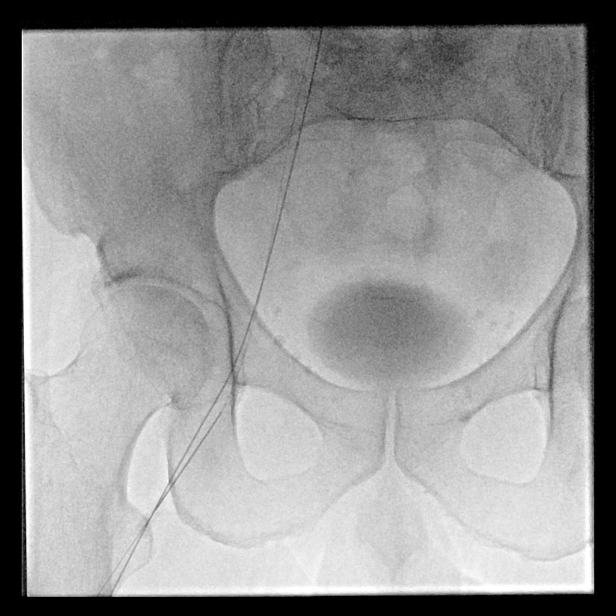
[im 6/13]
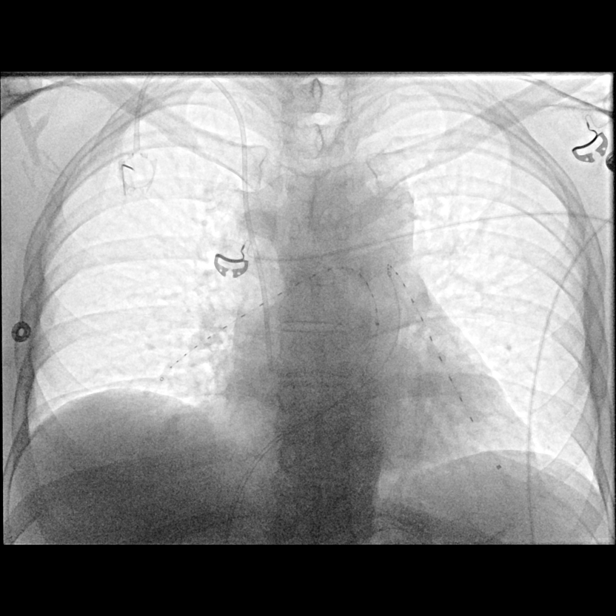
[im 7/13]
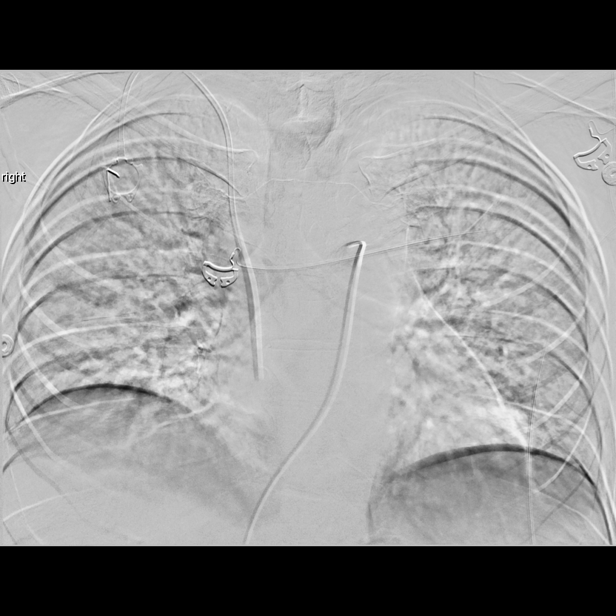
[im 8/13]
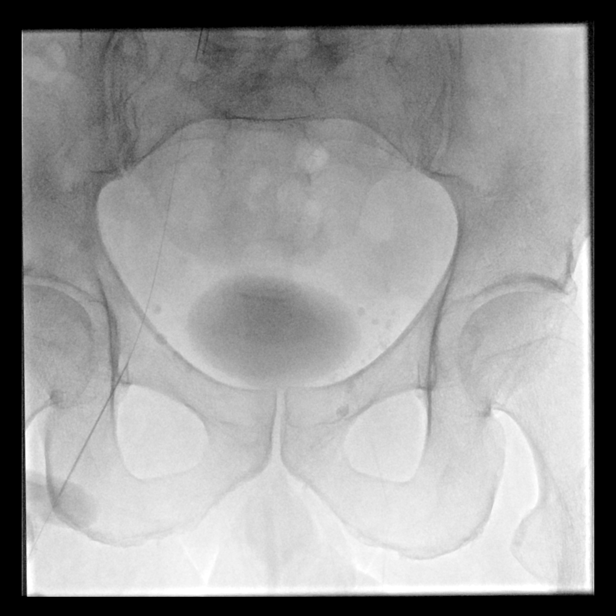
[im 9/13]
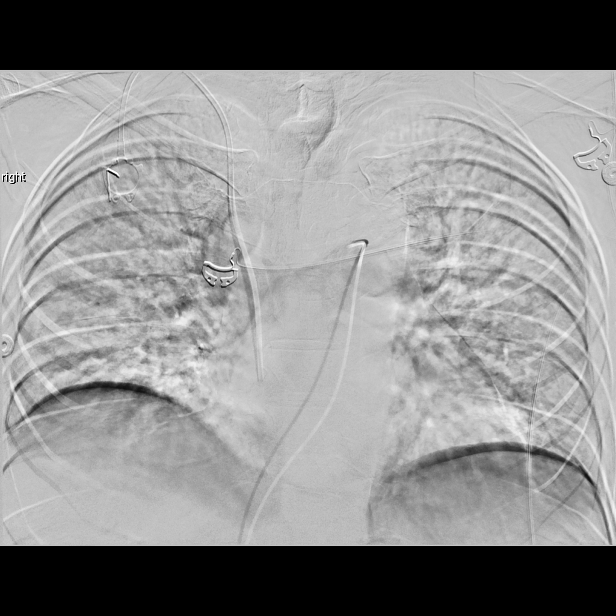
[im 10/13]
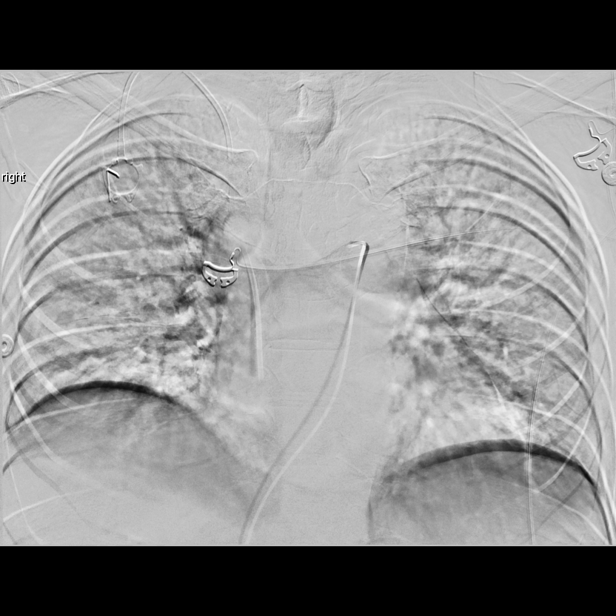
[im 11/13]
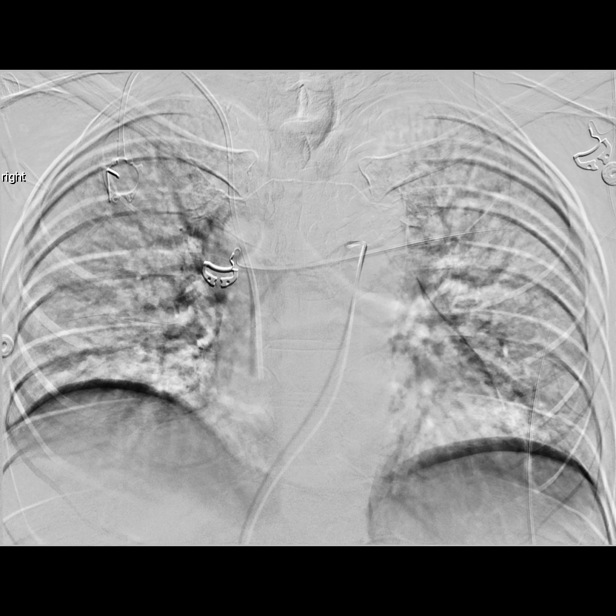
[im 12/13]
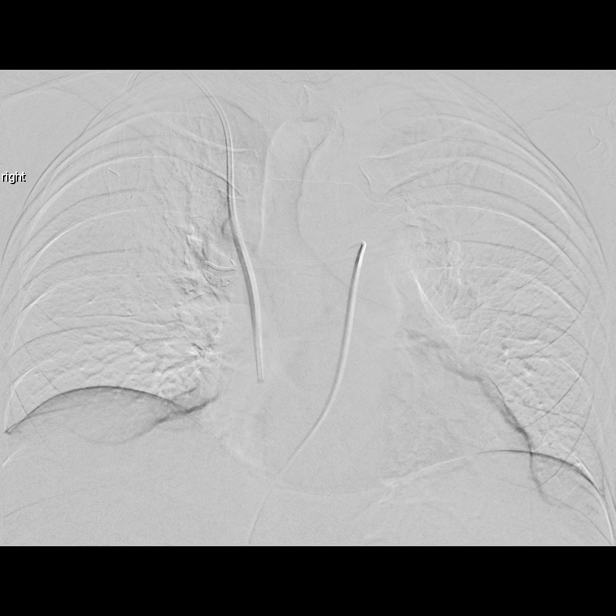
[im 13/13]
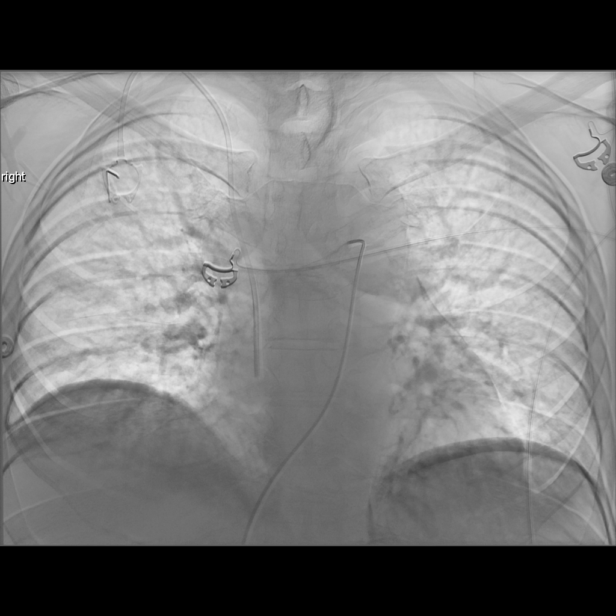

[13 of 13 positions shown; findings below may reference images not displayed]

MEDICATIONS:
None

ANESTHESIA/SEDATION:
Moderate (conscious) sedation was employed during this procedure. A
total of Versed 1 mg and Fentanyl 50 mcg was administered
intravenously.

Moderate Sedation Time: 40 minutes. The patient's level of
consciousness and vital signs were monitored continuously by
radiology nursing throughout the procedure under my direct
supervision.

CONTRAST:  10 mL OMNIPAQUE IOHEXOL 300 MG/ML  SOLN

FLUOROSCOPY TIME:  3 minutes, 36 seconds (24 mGy)

COMPLICATIONS:
None immediate.
Ultrasound scanning was performed of the right groin and
demonstrated wide patency of the right common femoral vein. As such,
the right common femoral vein was selected venous access.

The right groin was prepped and draped in the usual sterile fashion,
and a sterile drape was applied covering the operative field.
Maximum barrier sterile technique with sterile gowns and gloves were
used for the procedure. A timeout was performed prior to the
initiation of the procedure. Local anesthesia was provided with 1%
lidocaine.

Under direct ultrasound guidance, the right common femoral vein was
accessed with a micro puncture kit ultimately allowing placement of
a 6 French, 35 cm vascular sheath. Slightly caudal to this initial
access, the right common femoral was again accessed with an
additional micropuncture kit ultimately allowing placement of an
additional 6 French, 35 cm vascular sheath. Ultrasound images were
saved for procedural documentation purposes.

With the use of a glidewire, a vertebral catheter was advanced into
the main pulmonary artery and a limited central pulmonary
arteriogram was performed. Pressure measurements were attempted to
be obtained from the level the main pulmonary artery however this
ultimately proved unsuccessful secondary to measurement device
malfunction.

The vertebral catheter was advanced into the distal branch of the
left lower lobe pulmonary artery. Limited contrast injection
confirmed appropriate positioning. Over an exchange length Rosen
wire, the vertebral catheter was exchanged for a 105/12 cm multi
side-hole EKOS ultrasound assisted infusion catheter.

With the use of a glidewire, a vertebral catheter was advanced into
a distal branch of the right lower lobe pulmonary artery. Limited
contrast injection confirmed appropriate positioning. Over an
exchange length Rosen wire, the pigtail catheter was exchanged for a
105/18 cm multi side-hole EKOS ultrasound assisted infusion
catheter.

A postprocedural fluoroscopic image was obtained to document final
catheter positioning.

The external catheter tubing was secured at the right thigh and the
lytic therapy was initiated.

The patient tolerated the procedure well without immediate
postprocedural complication.
FINDINGS: Limited central pulmonary arteriogram was performed to document
appropriate catheter positioning.

As above, pressure measurements unable to be obtained secondary to
pressure measurement device malfunction.

Following the procedure, both ultrasound assisted infusion catheter
tips terminate within the distal aspects of the bilateral lower lobe
sub segmental pulmonary arteries.
IMPRESSION: Successful fluoroscopic guided initiation of bilateral ultrasound
assisted catheter directed pulmonary arterial lysis for sub massive
pulmonary embolism and right-sided heart strain.

PLAN:
- Will proceed with 12 hour bilateral pulmonary arterial catheter
directed thrombolysis.

- Will discussed appropriateness of placement of an IVC filter in
the setting of left lower extremity DVT with providing ICU attending
prior to removal of infusion catheters.

## 2019-09-15 ENCOUNTER — Inpatient Hospital Stay (HOSPITAL_BASED_OUTPATIENT_CLINIC_OR_DEPARTMENT_OTHER): Payer: Commercial Managed Care - PPO | Admitting: Hematology

## 2019-09-15 ENCOUNTER — Other Ambulatory Visit: Payer: Self-pay

## 2019-09-15 ENCOUNTER — Encounter: Payer: Self-pay | Admitting: Hematology

## 2019-09-15 ENCOUNTER — Inpatient Hospital Stay: Payer: Commercial Managed Care - PPO | Attending: Hematology & Oncology

## 2019-09-15 ENCOUNTER — Telehealth: Payer: Self-pay | Admitting: Hematology

## 2019-09-15 VITALS — BP 114/83 | HR 63 | Temp 97.1°F | Resp 18 | Ht 68.0 in | Wt 181.0 lb

## 2019-09-15 DIAGNOSIS — I82403 Acute embolism and thrombosis of unspecified deep veins of lower extremity, bilateral: Secondary | ICD-10-CM | POA: Diagnosis not present

## 2019-09-15 DIAGNOSIS — Z85818 Personal history of malignant neoplasm of other sites of lip, oral cavity, and pharynx: Secondary | ICD-10-CM | POA: Insufficient documentation

## 2019-09-15 DIAGNOSIS — Z7901 Long term (current) use of anticoagulants: Secondary | ICD-10-CM | POA: Insufficient documentation

## 2019-09-15 DIAGNOSIS — Z79899 Other long term (current) drug therapy: Secondary | ICD-10-CM | POA: Diagnosis not present

## 2019-09-15 DIAGNOSIS — Z9221 Personal history of antineoplastic chemotherapy: Secondary | ICD-10-CM | POA: Diagnosis not present

## 2019-09-15 DIAGNOSIS — C099 Malignant neoplasm of tonsil, unspecified: Secondary | ICD-10-CM

## 2019-09-15 DIAGNOSIS — Z86718 Personal history of other venous thrombosis and embolism: Secondary | ICD-10-CM | POA: Insufficient documentation

## 2019-09-15 DIAGNOSIS — I2699 Other pulmonary embolism without acute cor pulmonale: Secondary | ICD-10-CM | POA: Diagnosis not present

## 2019-09-15 DIAGNOSIS — Z923 Personal history of irradiation: Secondary | ICD-10-CM | POA: Insufficient documentation

## 2019-09-15 DIAGNOSIS — Z86711 Personal history of pulmonary embolism: Secondary | ICD-10-CM | POA: Diagnosis not present

## 2019-09-15 LAB — CBC WITH DIFFERENTIAL (CANCER CENTER ONLY)
Abs Immature Granulocytes: 0.02 10*3/uL (ref 0.00–0.07)
Basophils Absolute: 0 10*3/uL (ref 0.0–0.1)
Basophils Relative: 0 %
Eosinophils Absolute: 0.1 10*3/uL (ref 0.0–0.5)
Eosinophils Relative: 1 %
HCT: 45.3 % (ref 39.0–52.0)
Hemoglobin: 15.2 g/dL (ref 13.0–17.0)
Immature Granulocytes: 0 %
Lymphocytes Relative: 16 %
Lymphs Abs: 0.9 10*3/uL (ref 0.7–4.0)
MCH: 31 pg (ref 26.0–34.0)
MCHC: 33.6 g/dL (ref 30.0–36.0)
MCV: 92.4 fL (ref 80.0–100.0)
Monocytes Absolute: 0.7 10*3/uL (ref 0.1–1.0)
Monocytes Relative: 13 %
Neutro Abs: 4.2 10*3/uL (ref 1.7–7.7)
Neutrophils Relative %: 70 %
Platelet Count: 230 10*3/uL (ref 150–400)
RBC: 4.9 MIL/uL (ref 4.22–5.81)
RDW: 12.6 % (ref 11.5–15.5)
WBC Count: 5.9 10*3/uL (ref 4.0–10.5)
nRBC: 0 % (ref 0.0–0.2)

## 2019-09-15 LAB — CMP (CANCER CENTER ONLY)
ALT: 26 U/L (ref 0–44)
AST: 17 U/L (ref 15–41)
Albumin: 4.3 g/dL (ref 3.5–5.0)
Alkaline Phosphatase: 72 U/L (ref 38–126)
Anion gap: 6 (ref 5–15)
BUN: 23 mg/dL — ABNORMAL HIGH (ref 6–20)
CO2: 29 mmol/L (ref 22–32)
Calcium: 9.1 mg/dL (ref 8.9–10.3)
Chloride: 107 mmol/L (ref 98–111)
Creatinine: 1.19 mg/dL (ref 0.61–1.24)
GFR, Est AFR Am: >60 mL/min (ref >60)
GFR, Estimated: >60 mL/min (ref >60)
Glucose, Bld: 118 mg/dL — ABNORMAL HIGH (ref 70–99)
Potassium: 3.7 mmol/L (ref 3.5–5.1)
Sodium: 142 mmol/L (ref 135–145)
Total Bilirubin: 0.5 mg/dL (ref 0.3–1.2)
Total Protein: 6.3 g/dL — ABNORMAL LOW (ref 6.5–8.1)

## 2019-09-15 LAB — TSH: TSH: 5.084 u[IU]/mL — ABNORMAL HIGH (ref 0.320–4.118)

## 2019-09-15 NOTE — Telephone Encounter (Signed)
Return in 6 months for labs and clinic follow-up in the H&N survivorship clinic at [facility] per 4/27 los. Dr Maylon Peppers will send in basket message to [facility] scheduling team to schedule

## 2019-09-15 NOTE — Progress Notes (Signed)
[place] OFFICE PROGRESS NOTE  Patient Care Team: [name], MD as PCP - General (Family Medicine) Melissa Montane, MD as Consulting Physician (Otolaryngology) Eppie Gibson, MD as Attending Physician (Radiation Oncology) Tish Men, MD as Consulting Physician (Hematology) Leota Sauers, RN (Inactive) as Oncology Nurse Navigator Schinke, [place], [place] as Speech Language Pathologist (Speech Pathology) Karie Mainland, RD as Dietitian (Nutrition) Wynelle Beckmann, Melodie Bouillon, PT as Physical Therapist (Physical Therapy) Kennith Center, LCSW as Social Worker Francina Ames, MD as Referring Physician (Otolaryngology)  HEME/ONC OVERVIEW: 1. Stage I (pT1pN1M0) left tonsil SCCa, p16+ -02/2018:  US-guided bx of left cervical LN; path showed SCCa, p16+   Left tonsil malignancy w/ left Level II disease and effacement of left IJ vein on CT and PET, no mets -05/2018: TORS by Dr. 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 Bilateral PTE and LLE DVT -08/2018: admitted for syncope; CTA showed bilateral PTE w/ RV strain s/p tPA; doppler positive for acute DVT involving left popliteal, posterior tibial and peroneal veins  -08/2018 - present: Eliquis 5mg  BID   TREATMENT SUMMARY:  05/26/2018: TORS by Dr. Nicolette Bang at [facility]   07/07/2018 - 08/21/2018: adjuvant chemoradiation with weekly cisplatin 40mg /m2 x 6 doses  08/27/2018 - present: Eliquis 5mg  BID   ASSESSMENT & PLAN:   Stage I (cT1N1M0) left tonsil SCCa, p16+ -Currently on surveillance  -Clinically, patient does not have any evidence of recurrent disease on exam today -ENT exam normal with Dr. Nicolette Bang unremarkable in 05/2019; next in 10/2019 -In the absence of any  clinically suspicious findings, there is no indication for routine imaging surveillance  -I counseled the patient on the importance of jaw, shoulder and swallowing exercises -Periodic TSH and free T4 monitoring (~q25months)  Bilateral PTE and LLE DVT -S/p tPA for RV strain -Patient is tolerating anticoagulation without any abnormal bleeding or bruising -Goal of anticoagulation is lifelong -Continue Eliquis 5mg  BID  -Due to the large clot burden, patient is not a candidate for dose reduction for secondary ppx   Orders Placed This Encounter  Procedures  . CBC with Differential (Cancer Center Only)    Standing Status:   Future    Standing Expiration Date:   10/19/2020  . CMP ([place] only)    Standing Status:   Future    Standing Expiration Date:   10/19/2020  . TSH    Standing Status:   Future    Standing Expiration Date:   09/14/2020   The total time spent in the encounter was 30 minutes, including face-to-face time with the patient, review of various tests results, order additional studies/medications, documentation, and coordination of care plan.   All questions were answered. The patient knows to call the clinic with any problems, questions or concerns. No barriers to learning was detected.  Return in 6 months for labs and clinic follow-up in the H&N survivorship clinic.   Tish Men, MD 4/27/202110:10 AM  CHIEF COMPLAINT: "I am doing fine"  INTERVAL HISTORY: Glenn Goodman returns clinic for follow-up of history of squamous cell carcinoma of the left tonsil on surveillance.  Patient reports that he has been doing well since his last visit, and denies any new symptoms, such as difficulty or pain with swallowing or constitutional  symptoms.  He does regular jaw exercises, but has not been doing his shoulder exercises as often.  His ENT exam is remarkable in January 2021.  He denies any other complaint today.  REVIEW OF SYSTEMS:   Constitutional: ( - ) fevers, ( - )  chills , ( - )  night sweats Eyes: ( - ) blurriness of vision, ( - ) double vision, ( - ) watery eyes Ears, nose, mouth, throat, and face: ( - ) mucositis, ( - ) sore throat Respiratory: ( - ) cough, ( - ) dyspnea, ( - ) wheezes Cardiovascular: ( - ) palpitation, ( - ) chest discomfort, ( - ) lower extremity swelling Gastrointestinal:  ( - ) nausea, ( - ) heartburn, ( - ) change in bowel habits Skin: ( - ) abnormal skin rashes Lymphatics: ( - ) new lymphadenopathy, ( - ) easy bruising Neurological: ( - ) numbness, ( - ) tingling, ( - ) new weaknesses Behavioral/Psych: ( - ) mood change, ( - ) new changes  All other systems were reviewed with the patient and are negative.  SUMMARY OF ONCOLOGIC HISTORY: Oncology History  Squamous cell carcinoma of left tonsil ([place])  03/20/2018 Procedure   FNA of the left cervical LN in office   03/20/2018 Pathology Results   Non-diagnostic   04/07/2018 Procedure   US-guided left cervical LN biopsy/FNA    04/07/2018 Pathology Results   (Accession: [id number]) Lymph node, needle/core biopsy, left cervical - METASTATIC SQUAMOUS CELL CARCINOMA INVOLVING A LYMPH NODE (1/1) - SEE COMMENT  p16 IHC positive. EBV ISH negative.    04/22/2018 Pathology Results   (Accession: [id number]) Tonsil, biopsy, left, base of tongue - INVASIVE KERATINIZING SQUAMOUS CELL CARCINOMA.   04/29/2018 Imaging   PET:  IMPRESSION: 1. Left palatine tonsil/adjacent pharyngeal mucosal space lesion is asymmetric, measures about 1.3 cm in long axis, and has a maximum SUV of 8.2, compatible with malignancy. There is associated hypermetabolic left level IIa adenopathy as shown on prior exams. 2. Symmetric accentuated activity in the lymphoid tissue at the tongue base, maximum SUV 5.3, quite likely physiologic given the symmetry.  3. No findings of metastatic disease Pred to the chest, abdomen/pelvis, or skeleton.   05/26/2018 Surgery   Radical tonsillectomy   05/26/2018 Pathology Results    PROCEDURE: Radical tonsillectomy TUMOR SITE: Glossotonsillar sulcus TUMOR LATERALITY: LEFT TUMOR FOCALITY: Unifocal TUMOR SIZE: GREATEST DIMENSION: 1.6 cm HISTOLOGIC TYPE: HPV-mediated squamous cell carcinoma MARGINS: Involved by invasive tumor (focal) Specify location of closest margin: Tongue base (soft tissue) Note: Although an additional tongue base mucosal margin was submitted, location of tumor extension to margin appears 1 cm from the mucosal surface. LYMPHOVASCULAR INVASION: Not identified PERINEURAL INVASION: Present REGIONAL LYMPH NODES:  NUMBER OF LYMPH NODES INVOLVED: 1  NUMBER OF LYMPH NODES EXAMINED: 6  LATERALITY OF LYMPH NODES INVOLVED: Ipsilateral  SIZE OF LARGEST METASTATIC DEPOSIT: 6.8 cm  EXTRANODAL EXTENSION: Present   Distance from lymph node capsule: 6 millimeters PATHOLOGIC STAGE CLASSIFICATION (pTNM, AJCC 8TH Ed): pT1 pN1 (HPV-mediated oropharynx tumor)  pT1: Tumor 2 cm or smaller in greatest dimension  pN1: Metastasis in 4 or fewer lymph nodes ANCILLARY STUDIES:  p16 expression by immunohistochemistry: Positive  HPV by PCR: pending   05/26/2018 Cancer Staging   Staging form: Pharynx - HPV-Mediated Oropharynx, AJCC 8th Edition - Pathologic stage from 05/26/2018: Stage I (pT1, pN1, cM0, p16+) - Signed by Tish Men, MD on 06/18/2018   07/10/2018 - 07/10/2018 Chemotherapy   The patient had palonosetron (  ALOXI) injection 0.25 mg, 0.25 mg, Intravenous,  Once, 0 of 3 cycles CISplatin (PLATINOL) 198 mg in sodium chloride 0.9 % 500 mL chemo infusion, 100 mg/m2 = 198 mg, Intravenous,  Once, 0 of 3 cycles fosaprepitant (EMEND) 150 mg, dexamethasone (DECADRON) 12 mg in sodium chloride 0.9 % 145 mL IVPB, , Intravenous,  Once, 0 of 3 cycles  for chemotherapy treatment.    07/10/2018 - 08/20/2018 Chemotherapy   The patient had palonosetron (ALOXI) injection 0.25 mg, 0.25 mg, Intravenous,  Once, 6 of 6 cycles Administration: 0.25 mg  (07/10/2018), 0.25 mg (07/18/2018), 0.25 mg (07/24/2018), 0.25 mg (07/31/2018), 0.25 mg (08/07/2018), 0.25 mg (08/14/2018) CISplatin (PLATINOL) 79 mg in sodium chloride 0.9 % 250 mL chemo infusion, 40 mg/m2 = 79 mg, Intravenous,  Once, 6 of 6 cycles Administration: 79 mg (07/10/2018), 79 mg (07/18/2018), 79 mg (07/24/2018), 79 mg (07/31/2018), 79 mg (08/07/2018), 79 mg (08/14/2018) fosaprepitant (EMEND) 150 mg, dexamethasone (DECADRON) 12 mg in sodium chloride 0.9 % 145 mL IVPB, , Intravenous,  Once, 6 of 6 cycles Administration:  (07/10/2018),  (07/18/2018),  (07/24/2018),  (07/31/2018),  (08/07/2018),  (08/14/2018)  for chemotherapy treatment.    10/20/2018 Imaging   CT neck w/ contrast: IMPRESSION: Interval left tonsillectomy and neck dissection. No evidence of residual mass in the left tonsillar region. Postsurgical distortion of the left neck, without evidence of residual or recurrent tumor.  I would categorize this as NI RADS 1 based on the CT, but this could be altered based on follow-up PET.   10/20/2018 Imaging   CT chest w/ contrast: IMPRESSION: No evidence of metastatic disease in the chest.   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[place])  05/09/2018 Initial Diagnosis   Malignant neoplasm of base of tongue ([place])   05/09/2018 Cancer Staging   Staging form: Pharynx - HPV-Mediated Oropharynx, AJCC 8th Edition - Clinical: Stage I (cT1, cN1, cM0, p16+) - Signed by Eppie Gibson, MD on 05/09/2018   06/11/2018 Cancer Staging   Staging form: Pharynx - HPV-Mediated Oropharynx, AJCC 8th Edition - Pathologic: Stage I (pT1, pN1, cM0, p16+) - Signed by Eppie Gibson, MD on 06/11/2018     I have reviewed the past medical history, past surgical history, social history and family history with the patient and they are unchanged from previous note.  ALLERGIES:  has No Known Allergies.  MEDICATIONS:  Current Outpatient Medications  Medication Sig Dispense Refill  . Acetaminophen (TYLENOL PO) Take by mouth.    Marland Kitchen apixaban  (ELIQUIS) 5 MG TABS tablet 10mg  po bid for 7 days then 5mg  po bid , for 30days supply. 60 tablet 0  . omeprazole (PRILOSEC) 20 MG capsule Take 1 capsule by mouth in the morning and at bedtime.    . pramipexole (MIRAPEX) 1 MG tablet Take 1 mg by mouth at bedtime.    . sodium fluoride (PREVIDENT 5000 PLUS) 1.1 % CREA dental cream Place 1 application onto teeth at bedtime. Apply to tooth brush. Brush teeth for 2 minutes. Spit out excess. DO NOT rinse afterwards. Repeat nightly.    . Zinc 50 MG TABS Take 1 tablet by mouth daily.     No current facility-administered medications for this visit.   Facility-Administered Medications Ordered in Other Visits  Medication Dose Route Frequency Provider Last Rate Last Admin  . heparin lock flush 100 unit/mL  500 Units Intravenous Once Volanda Napoleon, MD      . sodium chloride flush (NS) 0.9 % injection 10 mL  10 mL Intravenous PRN Marin Olp, Rudell Cobb, MD  PHYSICAL EXAMINATION: ECOG PERFORMANCE STATUS: 0 - Asymptomatic  Today's Vitals   09/15/19 1001  BP: 114/83  Pulse: 63  Resp: 18  Temp: (!) 97.1 F (36.2 C)  TempSrc: Temporal  SpO2: 100%  Weight: 181 lb (82.1 kg)  Height: 5\' 8"  (1.727 m)  PainSc: 0-No pain   Body mass index is 27.52 kg/m.  Filed Weights   09/15/19 1001  Weight: 181 lb (82.1 kg)    GENERAL: alert, no distress and comfortable SKIN: skin color, texture, turgor are normal, no rashes or significant lesions EYES: conjunctiva are pink and non-injected, sclera clear OROPHARYNX: no exudate, no erythema; lips, buccal mucosa, and tongue normal  NECK: supple, non-tender, slightly firm in the left neck from scar LYMPH:  no palpable lymphadenopathy in the cervical LUNGS: clear to auscultation with normal breathing effort HEART: regular rate & rhythm and no murmurs and no lower extremity edema ABDOMEN: soft, non-tender, non-distended, normal bowel sounds Musculoskeletal: no cyanosis of digits and no clubbing  PSYCH: alert &  oriented x 3, fluent speech  LABORATORY DATA:  I have reviewed the data as listed    Component Value Date/Time   NA 142 09/15/2019 0934   K 3.7 09/15/2019 0934   CL 107 09/15/2019 0934   CO2 29 09/15/2019 0934   GLUCOSE 118 (H) 09/15/2019 0934   BUN 23 (H) 09/15/2019 0934   CREATININE 1.19 09/15/2019 0934   CALCIUM 9.1 09/15/2019 0934   PROT 6.3 (L) 09/15/2019 0934   ALBUMIN 4.3 09/15/2019 0934   AST 17 09/15/2019 0934   ALT 26 09/15/2019 0934   ALKPHOS 72 09/15/2019 0934   BILITOT 0.5 09/15/2019 0934   GFRNONAA >60 09/15/2019 0934   GFRAA >60 09/15/2019 0934    No results found for: SPEP, UPEP  Lab Results  Component Value Date   WBC 5.9 09/15/2019   NEUTROABS 4.2 09/15/2019   HGB 15.2 09/15/2019   HCT 45.3 09/15/2019   MCV 92.4 09/15/2019   PLT 230 09/15/2019      Chemistry      Component Value Date/Time   NA 142 09/15/2019 0934   K 3.7 09/15/2019 0934   CL 107 09/15/2019 0934   CO2 29 09/15/2019 0934   BUN 23 (H) 09/15/2019 0934   CREATININE 1.19 09/15/2019 0934      Component Value Date/Time   CALCIUM 9.1 09/15/2019 0934   ALKPHOS 72 09/15/2019 0934   AST 17 09/15/2019 0934   ALT 26 09/15/2019 0934   BILITOT 0.5 09/15/2019 0934       RADIOGRAPHIC STUDIES: I have personally reviewed the radiological images as listed below and agreed with the findings in the report. No results found.

## 2019-09-16 ENCOUNTER — Telehealth: Payer: Self-pay | Admitting: Medical

## 2019-09-16 NOTE — Telephone Encounter (Signed)
Scheduled appt per 4/27 sch message - mailed reminder letter with apt date and time

## 2019-09-17 ENCOUNTER — Telehealth: Payer: Self-pay | Admitting: *Deleted

## 2019-09-17 NOTE — Telephone Encounter (Signed)
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  If he is asymptomatic, I would recommend having it repeated in 3-6 months and if it continues to rise, he would likely need Synthroid.   Thanks.  [place]  ----- Message ----- From: Amelia Jo I, RN Sent: 09/17/2019  10:02 AM EDT To: Tish Men, MD  Hey Dr. Maylon Peppers,  Patient wants to know if he is not taking any medication for Thyroid, and not on treatment, why is the TSH increasing? Anything he should be concerned about? ----- Message ----- From: Tish Men, MD Sent: 09/15/2019  12:58 PM EDT To: Hebert Soho, RN  Delrae Sawyers,  Can you let Mr. Horr know that his TSH is slightly elevated, and I would suggest having it repeated in 6 months? Thanks.  [place]  ----- Message ----- From: Buel Ream, Lab In [place] Sent: 09/15/2019   9:44 AM EDT To: Tish Men, MD

## 2019-09-17 NOTE — Telephone Encounter (Signed)
As noted below by Dr. Maylon Peppers, I informed patient of his TSH level. Per Dr. 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 If you are asymptomatic, Dr. Maylon Peppers recommends having it repeated in 3-6 months. If the lab result continues to rise, more than likely you would need to stat Synthroid. He verbalized understanding. He asked me to fax the results to his PCP, Dr. [name].

## 2020-01-14 NOTE — Progress Notes (Signed)
Glenn Goodman presents for follow up of radiation completed 08/21/2018 to his tongue/head and neck  Pain issues, if any: Patient denies Using a feeding tube?: N/A Weight changes, if any:  Wt Readings from Last 3 Encounters:  01/15/20 179 lb 8 oz (81.4 kg)  09/15/19 181 lb (82.1 kg)  03/04/19 178 lb 4.8 oz (80.9 kg)   Swallowing issues, if any: Patient denies any issues. He does have ensure he has something to drink with drier foods, but otherwise no concerns.  Smoking or chewing tobacco? None Using fluoride trays daily? Using fluoride toothpaste Last ENT visit was on: 11/10/2019 Saw Dr. Francina Ames: "No new palpable neck masses or enlarged lymph nodes. There is no evidence of disease on exam today. One-year surveillance CT ordered. I will call with the results of the scan when they are available to me. Return in November, certainly sooner if there are any questions or problems"  Other notable issues, if any:  Still dealing with dry mouth and altered taste (sweet foods "still don't taste quite right"). Mild swelling/skin drooping under neck, but does not concern patient. He continues to do neck exercises and massage area. Small area of numbness along left ear line to surgical scar.  CT Neck/Thyroid 11/30/2019: FINDINGS:   -Expected post-treatment changes are noted.  -No evidence of recurrent disease is demonstrated at the primary site.  -No pathologically enlarged, necrotic, or otherwise abnormal lymph nodes are seen.  -No lesions suspicious for metastatic involvement are identified in the imaged portions of the brain, orbits, spine, and lungs.  -There are no findings to suggest a second primary malignancy in the imaged portions of the aerodigestive tract.   Vitals:   01/15/20 1416  BP: 121/90  Pulse: 69  Resp: 18  Temp: 98.7 F (37.1 C)  SpO2: 99%

## 2020-01-15 ENCOUNTER — Other Ambulatory Visit: Payer: Self-pay

## 2020-01-15 ENCOUNTER — Ambulatory Visit
Admission: RE | Admit: 2020-01-15 | Discharge: 2020-01-15 | Disposition: A | Discharge status: Home / Self Care | Payer: Commercial Managed Care - PPO | Source: Ambulatory Visit | Attending: Radiation Oncology | Admitting: Radiation Oncology

## 2020-01-15 VITALS — BP 121/90 | HR 69 | Temp 98.7°F | Resp 18 | Ht 68.0 in | Wt 179.5 lb

## 2020-01-15 DIAGNOSIS — Z923 Personal history of irradiation: Secondary | ICD-10-CM | POA: Insufficient documentation

## 2020-01-15 DIAGNOSIS — C01 Malignant neoplasm of base of tongue: Secondary | ICD-10-CM

## 2020-01-15 DIAGNOSIS — R682 Dry mouth, unspecified: Secondary | ICD-10-CM | POA: Insufficient documentation

## 2020-01-15 DIAGNOSIS — Z79899 Other long term (current) drug therapy: Secondary | ICD-10-CM | POA: Diagnosis not present

## 2020-01-15 DIAGNOSIS — C099 Malignant neoplasm of tonsil, unspecified: Secondary | ICD-10-CM

## 2020-01-15 DIAGNOSIS — Z85819 Personal history of malignant neoplasm of unspecified site of lip, oral cavity, and pharynx: Secondary | ICD-10-CM | POA: Diagnosis not present

## 2020-01-15 DIAGNOSIS — Z7901 Long term (current) use of anticoagulants: Secondary | ICD-10-CM | POA: Diagnosis not present

## 2020-01-15 DIAGNOSIS — C109 Malignant neoplasm of oropharynx, unspecified: Secondary | ICD-10-CM

## 2020-01-19 ENCOUNTER — Encounter: Payer: Self-pay | Admitting: Radiation Oncology

## 2020-01-19 DIAGNOSIS — C01 Malignant neoplasm of base of tongue: Secondary | ICD-10-CM | POA: Insufficient documentation

## 2020-01-19 NOTE — Progress Notes (Signed)
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 Squamous cell carcinoma of left tonsil ([facility])  C09.9   2. 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 Malignant neoplasm of base of tongue ([place])  C01     Radiation Treatment Dates: 07/07/2018 through 08/21/2018 Site Technique Total Dose Dose per Fx Completed Fx Beam Energies  Head & neck: HN_tongueb IMRT 66/66 2 33/33 6X   CHIEF COMPLAINT: follow up and surveillance of cancer   NARRATIVE: [name] presents for follow up of radiation completed 08/21/2018     Pain issues, if any: Patient denies Using a feeding tube?: N/A Weight changes, if any:  Wt Readings from Last 3 Encounters:  01/15/20 179 lb 8 oz (81.4 kg)  09/15/19 181 lb (82.1 kg)  03/04/19 178 lb 4.8 oz (80.9 kg)   Swallowing issues, if any: Patient denies any issues. He does have ensure he has something to drink with drier foods, but otherwise no concerns.  Smoking or chewing tobacco? None Using fluoride trays daily? Using fluoride toothpaste Last ENT visit was on: 11/10/2019 Saw Dr. Francina Ames: "No new palpable neck masses or enlarged lymph nodes. There is no evidence of disease on exam today. One-year surveillance CT ordered.  I will call with the results of the scan when they are available to me. Return in November, certainly sooner if there are any questions or problems"  Other notable issues, if any:  Still dealing with dry mouth and altered taste (sweet foods "still don't taste quite right"). Mild swelling/skin drooping under neck, but does not concern patient. He continues to do neck exercises and massage area. Small area of numbness along left ear line to surgical scar.  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    CT Neck/Thyroid 11/30/2019: FINDINGS:   -Expected post-treatment changes are noted.  -No evidence of recurrent disease is demonstrated at the primary site.  -No pathologically enlarged, necrotic, or otherwise abnormal lymph nodes are seen.  -No lesions suspicious for metastatic involvement are identified in the imaged portions of the brain, orbits, spine, and lungs.  -There are no findings to suggest a second primary malignancy in the imaged portions of the aerodigestive tract.     ALLERGIES:  has No Known Allergies.  Meds: Current Outpatient Medications  Medication Sig Dispense Refill  . Acetaminophen (TYLENOL PO) Take by mouth.    Marland Kitchen apixaban (ELIQUIS) 5 MG TABS tablet 10mg  po bid for 7 days then 5mg  po bid , for 30days supply. 60 tablet 0  . CLINPRO 5000 1.1 % PSTE Place 1 application onto teeth as directed.    Marland Kitchen omeprazole (PRILOSEC) 20 MG capsule Take 1 capsule by mouth in the morning and at bedtime.    Marland Kitchen  pramipexole (MIRAPEX) 1 MG tablet Take 1 mg by mouth at bedtime.    . sodium fluoride (PREVIDENT 5000 PLUS) 1.1 % CREA dental cream Place 1 application onto teeth at bedtime. Apply to tooth brush. Brush teeth for 2 minutes. Spit out excess. DO NOT rinse afterwards. Repeat nightly.    . Zinc 50 MG TABS Take 1 tablet by mouth daily. (Patient not taking: Reported on 01/15/2020)     No current facility-administered medications for this encounter.   Facility-Administered Medications  Ordered in Other Encounters  Medication Dose Route Frequency Provider Last Rate Last Admin  . heparin lock flush 100 unit/mL  500 Units Intravenous Once Ennever, Glenn Cobb, MD      . sodium chloride flush (NS) 0.9 % injection 10 mL  10 mL Intravenous PRN Volanda Napoleon, MD        Physical Findings: The patient is in no acute distress. Patient is alert and oriented. Wt Readings from Last 3 Encounters:  01/15/20 179 lb 8 oz (81.4 kg)  09/15/19 181 lb (82.1 kg)  03/04/19 178 lb 4.8 oz (80.9 kg)    height is 5\' 8"  (1.727 m) and weight is 179 lb 8 oz (81.4 kg). His oral temperature is 98.7 F (37.1 C). His blood pressure is 121/90 and his pulse is 69. His respiration is 18 and oxygen saturation is 99%. .   General: Alert and oriented, in no acute distress HEENT: Head is normocephalic. Extraocular movements are intact. Oropharynx is clear. Neck: Neck is supple, no palpable cervical or supraclavicular lymphadenopathy.   Skin: No concerning neck lesions. Neurologic: Cranial nerves II through XII are grossly intact. No obvious focalities. Speech is fluent. Coordination is intact.  Ambulatory. Psychiatric: Judgment and insight are intact. Affect is appropriate.   Lab Findings: Lab Results  Component Value Date   WBC 5.9 09/15/2019   HGB 15.2 09/15/2019   HCT 45.3 09/15/2019   MCV 92.4 09/15/2019   PLT 230 09/15/2019    Lab Results  Component Value Date   TSH 5.084 (H) 09/15/2019    Radiographic Findings: No results found.  Impression/Plan:    1) Head and Neck Cancer Status: NED  2) Nutritional Status: good PEG tube: removed  3) Swallowing: as tolerated, continue SLP exercises  5) Dental: Encouraged to continue regular followup with dentistry, and dental hygiene    6) Thyroid function: check annually - followed by med/onc. Lab Results  Component Value Date   TSH 5.084 (H) 09/15/2019   7)  Continue f/u w/ ENT and med onc; see me in March 2022, sooner if needed. He is  pleased with this plan.  8)  We discussed measures to reduce the risk of infection during the COVID-19 pandemic.  He has been vaccinated.   On date of service, in total, I spent 25 minutes on this encounter. Patient was seen in person.   _____________________________________   Glenn Gibson, MD

## 2020-02-26 ENCOUNTER — Encounter: Payer: Self-pay | Admitting: Neurology

## 2020-03-06 ENCOUNTER — Other Ambulatory Visit: Payer: Self-pay | Admitting: Medical

## 2020-03-06 DIAGNOSIS — C099 Malignant neoplasm of tonsil, unspecified: Secondary | ICD-10-CM

## 2020-03-06 NOTE — Progress Notes (Signed)
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 EBV ISH negative.    04/22/2018 Pathology Results   (Accession: [id number]) Tonsil, biopsy, left, base of tongue - INVASIVE KERATINIZING SQUAMOUS CELL CARCINOMA.   04/29/2018 Imaging   PET:  IMPRESSION: 1. Left palatine tonsil/adjacent pharyngeal mucosal space lesion is asymmetric, measures about 1.3 cm in long axis, and has a maximum SUV of 8.2, compatible with malignancy. There is associated hypermetabolic left level IIa adenopathy as shown on prior exams. 2. Symmetric accentuated activity in the lymphoid tissue at the tongue base, maximum SUV 5.3, quite likely physiologic given the symmetry.  3. No findings of metastatic disease Pred to the chest, abdomen/pelvis, or skeleton.   05/26/2018 Surgery   Radical tonsillectomy   05/26/2018 Pathology Results   PROCEDURE: Radical tonsillectomy TUMOR SITE: Glossotonsillar sulcus TUMOR LATERALITY: LEFT TUMOR FOCALITY: Unifocal TUMOR SIZE: GREATEST DIMENSION: 1.6 cm HISTOLOGIC TYPE: HPV-mediated squamous cell carcinoma MARGINS: Involved by invasive tumor (focal) Specify  location of closest margin: Tongue base (soft tissue) Note: Although an additional tongue base mucosal margin was submitted, location of tumor extension to margin appears 1 cm from the mucosal surface. LYMPHOVASCULAR INVASION: Not identified PERINEURAL INVASION: Present REGIONAL LYMPH NODES:  NUMBER OF LYMPH NODES INVOLVED: 1  NUMBER OF LYMPH NODES EXAMINED: 6  LATERALITY OF LYMPH NODES INVOLVED: Ipsilateral  SIZE OF LARGEST METASTATIC DEPOSIT: 6.8 cm  EXTRANODAL EXTENSION: Present   Distance from lymph node capsule: 6 millimeters PATHOLOGIC STAGE CLASSIFICATION (pTNM, AJCC 8TH Ed): pT1 pN1 (HPV-mediated oropharynx tumor)  pT1: Tumor 2 cm or smaller in greatest dimension  pN1: Metastasis in 4 or fewer lymph nodes ANCILLARY STUDIES:  p16 expression by immunohistochemistry: Positive  HPV by PCR: pending   05/26/2018 Cancer Staging   Staging form: Pharynx - HPV-Mediated Oropharynx, AJCC 8th Edition - Pathologic stage from 05/26/2018: Stage I (pT1, pN1, cM0, p16+) - Signed by Tish Men, MD on 06/18/2018   07/10/2018 - 07/10/2018 Chemotherapy   The patient had dexamethasone (DECADRON) 4 MG tablet, 1 of 1 cycle, Start date: 06/18/2018, End date: 06/23/2018 palonosetron (ALOXI) injection 0.25 mg, 0.25 mg, Intravenous,  Once, 0 of 3 cycles CISplatin (PLATINOL) 198 mg in sodium chloride 0.9 % 500 mL chemo infusion, 100 mg/m2 = 198 mg, Intravenous,  Once, 0 of 3 cycles fosaprepitant (EMEND) 150 mg, dexamethasone (DECADRON) 12 mg in sodium chloride 0.9 % 145 mL IVPB, , Intravenous,  Once, 0 of 3 cycles  for chemotherapy treatment.    07/10/2018 - 08/14/2018 Chemotherapy   The patient had dexamethasone (DECADRON) 4 MG tablet, 1 of 1  cycle, Start date: 06/23/2018, End date: 08/27/2018 palonosetron (ALOXI) injection 0.25 mg, 0.25 mg, Intravenous,  Once, 6 of 6 cycles Administration: 0.25 mg (07/10/2018), 0.25 mg (07/18/2018), 0.25 mg (07/24/2018), 0.25 mg (07/31/2018), 0.25 mg (08/07/2018),  0.25 mg (08/14/2018) CISplatin (PLATINOL) 79 mg in sodium chloride 0.9 % 250 mL chemo infusion, 40 mg/m2 = 79 mg, Intravenous,  Once, 6 of 6 cycles Administration: 79 mg (07/10/2018), 79 mg (07/18/2018), 79 mg (07/24/2018), 79 mg (07/31/2018), 79 mg (08/07/2018), 79 mg (08/14/2018) fosaprepitant (EMEND) 150 mg, dexamethasone (DECADRON) 12 mg in sodium chloride 0.9 % 145 mL IVPB, , Intravenous,  Once, 6 of 6 cycles Administration:  (07/10/2018),  (07/18/2018),  (07/24/2018),  (07/31/2018),  (08/07/2018),  (08/14/2018)  for chemotherapy treatment.    10/20/2018 Imaging   CT neck w/ contrast: IMPRESSION: Interval left tonsillectomy and neck dissection. No evidence of residual mass in the left tonsillar region. Postsurgical distortion of the left neck, without evidence of residual or recurrent tumor.  I would categorize this as NI RADS 1 based on the CT, but this could be altered based on follow-up PET.   10/20/2018 Imaging   CT chest w/ contrast: IMPRESSION: No evidence of metastatic disease in the chest.   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 He presents to clinic today for routine follow up having last been seen on 09/15/2019 by Dr Maylon Peppers and on 01/15/2020 by Dr. Isidore Moos. He presents to the clinic today for his first survivor ship meeting.  He reports having a "knot" in his right posterior neck.  He reports that the area  is slightly tender.  He does not recall how long it has been there.  He is otherwise doing well except for having some dry mouth.  He denies fevers, chills, sweats, headache, nausea, vomiting, constipation, diarrhea, or difficulty swallowing.  He is not having any lymphedema in his neck.  He was most recently seen by Dr. Nicolette Bang at [facility] with a CT scan complete in July 2021 which was negative by his report.  He is scheduled to see Dr. Nicolette Bang in follow-up on 04/05/2020.  He is to be seen by his primary care provider again on 07/08/2020 and has a follow-up appointment with Dr. Isidore Moos on 08/17/2020.     REVIEW OF SYSTEMS:   Visual changes:    no   Swelling of the face:    no   Swelling of the mouth:    no   Swelling of the throat:    no   Dental problems:    no   Skin changes at treatment site:  no   Dry mouth:     yes   Difficulty talking:    no  Thickened saliva:    no   Increased mucus:    no  Difficulty opening mouth:   no  Fatigue:  no   Pain or difficulty swallowing or chewing: no    Loss of appetite:    no   Numbness of the ear:    no  Weakness raising the arm(s):  no  Lack of lip movement:    no  Facial disfigurement:    no  Numbness of face or neck:   no  Loss of voice or impaired speech:  no  Hearing difficulty:    no  Lymphedema:     no  Pain:      yes       Pain location: Posterior right neck  Severity: mild      -Weight: has not lost weight      -Last ENT visit:    July 2021  -Last Rad Onc visit:   01/15/2020  -Last Dentist visit:   Unknown     CURRENT MEDICATIONS:    Current Outpatient Medications on File Prior to Visit  Medication Sig Dispense Refill  . Acetaminophen (TYLENOL PO) Take by mouth.    Marland Kitchen apixaban (ELIQUIS) 5 MG TABS tablet 10mg  po bid for 7 days then 5mg  po bid , for 30days supply. 60 tablet 0  . CLINPRO 5000 1.1 % PSTE Place 1 application onto teeth as directed.    Marland Kitchen omeprazole  (PRILOSEC) 20 MG capsule Take 1 capsule by mouth in the morning and at bedtime.    . pramipexole (MIRAPEX) 1 MG tablet Take 1 mg by mouth at bedtime.    . sodium fluoride (PREVIDENT 5000 PLUS) 1.1 % CREA dental cream Place 1 application onto teeth at bedtime. Apply to tooth brush. Brush teeth for 2 minutes. Spit out excess. DO NOT rinse afterwards. Repeat nightly.    . Zinc 50 MG TABS Take 1 tablet by mouth daily. (Patient not taking: Reported on 01/15/2020)     Current Facility-Administered Medications on File Prior to Visit  Medication Dose Route Frequency Provider Last Rate Last Admin  . heparin lock flush 100 unit/mL  500 Units Intravenous Once Ennever, Rudell Cobb, MD      . sodium chloride flush (NS) 0.9 % injection 10 mL  10 mL Intravenous PRN Ennever, Rudell Cobb, MD            ALLERGIES:    No Known Allergies      ADDITIONAL REVIEW OF SYSTEMS:    Review of Systems  Constitutional: Negative for chills, diaphoresis, fever, malaise/fatigue and weight loss.  HENT: Negative for ear discharge, ear pain, hearing loss, sore throat and tinnitus.   Eyes: Negative.   Respiratory: Negative for cough, shortness of breath and wheezing.   Cardiovascular: Negative for chest pain, palpitations and leg swelling.  Gastrointestinal: Negative for abdominal pain, constipation, diarrhea, nausea and vomiting.  Genitourinary: Negative.   Musculoskeletal: Positive for neck pain (Posterior right neck). Negative for back pain and myalgias.  Neurological: Negative for dizziness, tingling, sensory change, speech change and headaches.  Psychiatric/Behavioral: Negative for depression. The patient is not nervous/anxious and does not have insomnia.         PHYSICAL EXAM:    Vitals:   03/07/20 1033  BP: 121/86  Pulse: 65  Resp: 18  Temp: (!) 97.2 F (36.2 C)  SpO2: 99%    Filed Weights   03/07/20 1033  Weight: 184 lb 9.6 oz (83.7 kg)    Weight Date  184.6 pounds  03/07/2020  181 pounds   09/15/2019               Pre-treatment (  RT consult date):     General: JOEL COWIN is a 59 y.o. male who appears to be in no acute distress.? [facility] is unaccompanied today.    HEENT:    Head: atraumatic and normocephalic.?    Pupils: equal and reactive to light.    Conjunctivae: clear without exudate.?    Sclerae: anicteric.    Oral mucosa: pink and moist without lesions.?    Tongue: pink, moist, and midline.   Oropharynx: pink and moist, without lesions.  Tonsils are absent.  Lymph: There is a 1 x 1 cm firm and mobile lesion in the mid right occipital chain.  Otherwise there are no preauricular, postauricular, cervical, left occipital, supraclavicular, or infraclavicular lymphadenopathy noted on palpation. ?   Neck: No palpable masses. Skin on neck is slightly thickened on the left neck.  Cardiovascular: Regular rate and rhythm without murmurs, rubs, or gallops.Marland Kitchen   Respiratory: Clear to auscultation bilaterally without wheezes, rales, or rhonchi. Chest expansion symmetric without accessory muscle use; breathing non-labored.?   GI: Abdomen soft and round. Bowel sounds normoactive. No hepatosplenomegaly, masses,  tenderness, rebound, guarding, or distention.   GU: Deferred. ?   Neuro: No focal deficits. Steady gait. ?   Psych: Normal mood and affect for situation.   Extremities: No edema.?   Skin: Warm and dry. No rashes or lesions.      LABORATORY DATA:    The patient's complete chemistry panel returned showing:   Lab Results  Component Value Date   NA 143 03/07/2020   K 4.0 03/07/2020   CL 106 03/07/2020   CO2 27 03/07/2020   GLUCOSE 159 (H) 03/07/2020   BUN 17 03/07/2020   CREATININE 1.07 03/07/2020   ALBUMIN 3.8 03/07/2020   ALKPHOS 83 03/07/2020   ALT 21 03/07/2020   AST 15 03/07/2020   BILITOT 0.3 03/07/2020        The patient's TSH returned at:  Lab Results  Component Value Date   TSH 9.425 (H) 03/07/2020        The  patient's complete blood count returned showing:      Component Value Date/Time   WBC 6.4 03/07/2020 1009   WBC 8.1 11/28/2018 1318   RBC 5.13 03/07/2020 1009   HGB 16.1 03/07/2020 1009   HCT 47.6 03/07/2020 1009   PLT 236 03/07/2020 1009   MCV 92.8 03/07/2020 1009   MCH 31.4 03/07/2020 1009   MCHC 33.8 03/07/2020 1009   RDW 12.4 03/07/2020 1009   LYMPHSABS 0.9 03/07/2020 1009   MONOABS 0.6 03/07/2020 1009   EOSABS 0.1 03/07/2020 1009   BASOSABS 0.0 03/07/2020 1009        DIAGNOSTIC IMAGING:    No results found.       ASSESSMENT & PLAN:    JURGEN GROENEVELD is a 59 y.o. 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 He was treated with concurrent chemoradiation which he completed treatment on 08/14/2019  He presents to the survivorship clinic today for routine follow-up having last been seen on 09/15/2019 Dr Maylon Peppers and on 01/15/2020 by Dr. Isidore Moos.      1. Oral and oropharyngeal cancer ( Squamous cell carcinoma of left tonsil ):  Mr. Tupy is clinically without evidence of residual or recurrence cancer on physical exam today.    There is however an enlarged, firm, mobile, and slightly tender right mid occipital node that  was noted on the patient's exam today.  He was referred for a CT scan of the neck at [facility].  He will then follow-up they are with Dr. Hart Robinsons [place] when his CT scan results are available.  He will be seen back in the survivorship clinic in June 2022 as he is seeing ENT in November 2021, his primary care provider in February 2022 and Dr. Isidore Moos and radiation oncology in March 2022.     2. Nutritional status: Mr. Conly reports that he is currently able to consume adequate nutrition by mouth.  His weight is higher at 184.6 lbs today.  He was encouraged to continue to consume adequate hydration and nutrition, as tolerated.        3. At risk for  dysphagia: Given Mr. Lightcap treatment for oral and oropharyngeal cancer ( Squamous cell carcinoma of left tonsil ), which included surgery and radiation therapy, he maybe at risk for chronic dysphagia.  He reports having no difficulty with any foods is able to consume all foods and liquids without difficulty.        4.  At risk for neck lymphedema:  When patients with head & neck cancers are treated with surgery and/or radiation therapy, there is an associated increased risk of neck lymphedema.  Mr. Bogacki reports that currently he is not experiencing symptoms.      5.  At risk for hypothyroidism: The thyroid gland is often affected after treatment for head & neck cancer.  Mr. Rotert most recent TSH was  Lab Results  Component Value Date   TSH 9.425 (H) 03/07/2020   on (date).   We discussed that he will continue to have serial TSH monitoring for at least the next 5 years as part of his routine follow-up and post-cancer treatment care.  I have sent in a prescription for Synthroid for him and will recheck this in around 3 months.     6. At risk for tooth decay/dental concerns: After treatment with radiation for head & neck cancers, patients often experience dry mouth which increases their risk of dental caries. Mr. Reczek was encouraged to see his dentist 3-4 times per year. The patient should also continue to use his fluoride trays most days.  I encouraged him to reach out to his primary care dentist regularly.       7.  Lung cancer screening:  [place] now offers eligible patients lung cancer screening with a low-dose chest CT to aid in early detection, provide more effective treatment options, and ultimately improve survival benefits for patients diagnosed early.  Below is the selection criteria for screening:    Medicare patients: 55-77 years; privately insured patients 55-80 years.   Active or former smokers who have quit within the last 15 years.   30+ pack-year history of  smoking    Exclusion criteria - No signs/symptoms of lung cancer (i.e., no recent history of hemoptysis and no unexplained weight loss >15 pounds in the last 6 months).   Willing and healthy enough to undergo biopsies/surgery if needed.   Mr. Trefz currently does not meet criteria for lung cancer screening.  Therefore, I have not placed a referral for screening today.       DISPOSITION:    -See Dr. Nat Christen (ENT) in *November, 2021  -Return to cancer center to see Dr. Chryl Heck after his scheduled neck CT scan results are available  -Return to cancer center to see Survivorship PA in June, 2022.  03/09/20   10:23 AM        NOTE: PRIMARY CARE PROVIDER   [name], MD   Phone: [phone]   Fax: [phone]

## 2020-03-07 ENCOUNTER — Inpatient Hospital Stay: Payer: Commercial Managed Care - PPO

## 2020-03-07 ENCOUNTER — Other Ambulatory Visit: Payer: Self-pay

## 2020-03-07 ENCOUNTER — Inpatient Hospital Stay: Payer: Commercial Managed Care - PPO | Attending: Medical | Admitting: Medical

## 2020-03-07 VITALS — BP 121/86 | HR 65 | Temp 97.2°F | Resp 18 | Ht 68.0 in | Wt 184.6 lb

## 2020-03-07 DIAGNOSIS — Z7901 Long term (current) use of anticoagulants: Secondary | ICD-10-CM | POA: Diagnosis not present

## 2020-03-07 DIAGNOSIS — Z79899 Other long term (current) drug therapy: Secondary | ICD-10-CM | POA: Insufficient documentation

## 2020-03-07 DIAGNOSIS — R682 Dry mouth, unspecified: Secondary | ICD-10-CM | POA: Diagnosis not present

## 2020-03-07 DIAGNOSIS — C099 Malignant neoplasm of tonsil, unspecified: Secondary | ICD-10-CM

## 2020-03-07 DIAGNOSIS — R590 Localized enlarged lymph nodes: Secondary | ICD-10-CM

## 2020-03-07 DIAGNOSIS — Z9221 Personal history of antineoplastic chemotherapy: Secondary | ICD-10-CM | POA: Diagnosis not present

## 2020-03-07 DIAGNOSIS — C01 Malignant neoplasm of base of tongue: Secondary | ICD-10-CM | POA: Insufficient documentation

## 2020-03-07 LAB — CMP (CANCER CENTER ONLY)
ALT: 21 U/L (ref 0–44)
AST: 15 U/L (ref 15–41)
Albumin: 3.8 g/dL (ref 3.5–5.0)
Alkaline Phosphatase: 83 U/L (ref 38–126)
Anion gap: 10 (ref 5–15)
BUN: 17 mg/dL (ref 8–23)
CO2: 27 mmol/L (ref 22–32)
Calcium: 9.3 mg/dL (ref 8.9–10.3)
Chloride: 106 mmol/L (ref 98–111)
Creatinine: 1.07 mg/dL (ref 0.61–1.24)
GFR, Estimated: >60 mL/min (ref >60)
Glucose, Bld: 159 mg/dL — ABNORMAL HIGH (ref 70–99)
Potassium: 4 mmol/L (ref 3.5–5.1)
Sodium: 143 mmol/L (ref 135–145)
Total Bilirubin: 0.3 mg/dL (ref 0.3–1.2)
Total Protein: 6.5 g/dL (ref 6.5–8.1)

## 2020-03-07 LAB — CBC WITH DIFFERENTIAL (CANCER CENTER ONLY)
Abs Immature Granulocytes: 0.03 10*3/uL (ref 0.00–0.07)
Basophils Absolute: 0 10*3/uL (ref 0.0–0.1)
Basophils Relative: 1 %
Eosinophils Absolute: 0.1 10*3/uL (ref 0.0–0.5)
Eosinophils Relative: 1 %
HCT: 47.6 % (ref 39.0–52.0)
Hemoglobin: 16.1 g/dL (ref 13.0–17.0)
Immature Granulocytes: 1 %
Lymphocytes Relative: 14 %
Lymphs Abs: 0.9 10*3/uL (ref 0.7–4.0)
MCH: 31.4 pg (ref 26.0–34.0)
MCHC: 33.8 g/dL (ref 30.0–36.0)
MCV: 92.8 fL (ref 80.0–100.0)
Monocytes Absolute: 0.6 10*3/uL (ref 0.1–1.0)
Monocytes Relative: 9 %
Neutro Abs: 4.8 10*3/uL (ref 1.7–7.7)
Neutrophils Relative %: 74 %
Platelet Count: 236 10*3/uL (ref 150–400)
RBC: 5.13 MIL/uL (ref 4.22–5.81)
RDW: 12.4 % (ref 11.5–15.5)
WBC Count: 6.4 10*3/uL (ref 4.0–10.5)
nRBC: 0 % (ref 0.0–0.2)

## 2020-03-07 LAB — TSH: TSH: 9.425 u[IU]/mL — ABNORMAL HIGH (ref 0.320–4.118)

## 2020-03-07 NOTE — Patient Instructions (Signed)

## 2020-03-09 ENCOUNTER — Other Ambulatory Visit: Payer: Self-pay | Admitting: Medical

## 2020-03-09 ENCOUNTER — Encounter: Payer: Self-pay | Admitting: Medical

## 2020-03-09 DIAGNOSIS — E039 Hypothyroidism, unspecified: Secondary | ICD-10-CM | POA: Insufficient documentation

## 2020-03-09 MED ORDER — LEVOTHYROXINE SODIUM 25 MCG PO TABS: 25.0000 ug | ORAL_TABLET | Freq: Every day | ORAL | 5 refills | Status: DC

## 2020-03-16 ENCOUNTER — Encounter: Payer: Self-pay | Admitting: Medical

## 2020-03-17 ENCOUNTER — Encounter: Payer: Commercial Managed Care - PPO | Admitting: Medical

## 2020-03-21 ENCOUNTER — Other Ambulatory Visit: Payer: Self-pay

## 2020-03-21 ENCOUNTER — Ambulatory Visit (HOSPITAL_BASED_OUTPATIENT_CLINIC_OR_DEPARTMENT_OTHER)
Admission: RE | Admit: 2020-03-21 | Discharge: 2020-03-21 | Disposition: A | Discharge status: Home / Self Care | Payer: Commercial Managed Care - PPO | Source: Ambulatory Visit | Attending: Medical | Admitting: Medical

## 2020-03-21 DIAGNOSIS — C099 Malignant neoplasm of tonsil, unspecified: Secondary | ICD-10-CM | POA: Insufficient documentation

## 2020-03-21 DIAGNOSIS — R590 Localized enlarged lymph nodes: Secondary | ICD-10-CM | POA: Diagnosis present

## 2020-03-21 MED ORDER — IOHEXOL 300 MG/ML  SOLN
100.0000 mL | Freq: Once | INTRAMUSCULAR | Status: AC | PRN
  Administered 2020-03-21: 80 mL via INTRAVENOUS

## 2020-03-23 NOTE — Progress Notes (Signed)
This patient had requested to see you in [facility] after his scan. I called these to him since it showed no concerning findings and since you have not yet gotten a schedule for [facility]. I asked that he return sooner than scheduled if he had concerns.  Glenn Goodman

## 2020-04-29 ENCOUNTER — Telehealth: Payer: Self-pay | Admitting: Hematology

## 2020-04-29 NOTE — Telephone Encounter (Signed)
Release: [id number] Faxed medical Records to [facility] @ fax [phone]

## 2020-05-16 NOTE — Progress Notes (Signed)
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  History supplemented by referring provider's note.  He has been dealing with RLS for 10 years.  It affects either on leg or the other but usually not affected at the same time.  Over the course of the past several years, he has increased pramipexole from 0.25mg  to 1mg .  He has steadily had breakthrough symptoms at night and his PCP wouldn't increase the dose unless he was seen by a neurologist.  He takes the pramipexole at 7:45 PM and gets into bed at around 10PM.  Normally he can sleep through the night, but previously was having some breakthrough attacks.  He is unable to nap during the day because the symptoms affect him.  Over the past year, he has had some symptoms in the arms as well.  He has OSA and uses a CPAP  He is not aware of family history of RLS.  Current medication:  Pramipexole 1mg  QHS Past medications:  Ropinirole 1mg  (ineffective)   01/12/2020 LABS:  BMP with Na 141, K 4, Cl 105, CO2 30, Ca 9.4, glucose 103, BUN 20, Cr 1.09, GFR 69; HGB A1c 5.7  PAST MEDICAL HISTORY: Past Medical History:  Diagnosis Date  . Arthritis   . Cancer ([place])   . OSA on CPAP   . Restless leg syndrome     PAST SURGICAL HISTORY: Past Surgical History:  Procedure Laterality Date  . DIRECT LARYNGOSCOPY Left 04/22/2018   Dr. Janace Hoard  . FACIAL FRACTURE SURGERY Left 1986  . IR ANGIOGRAM PULMONARY BILATERAL SELECTIVE  08/22/2018  . IR ANGIOGRAM SELECTIVE EACH ADDITIONAL VESSEL  08/22/2018  . IR ANGIOGRAM SELECTIVE EACH ADDITIONAL VESSEL  08/22/2018  . IR GASTROSTOMY TUBE MOD SED   06/30/2018  . IR GASTROSTOMY TUBE REMOVAL  01/06/2019  . IR IMAGING GUIDED PORT INSERTION  06/30/2018  . IR INFUSION THROMBOL ARTERIAL INITIAL (MS)  08/22/2018  . IR INFUSION THROMBOL ARTERIAL INITIAL (MS)  08/22/2018  . IR REMOVAL TUN ACCESS W/ PORT W/O FL MOD SED  11/28/2018  . IR THROMB F/U EVAL ART/VEN FINAL DAY (MS)  08/23/2018  . IR US GUIDE VASC ACCESS RIGHT  08/22/2018  . MODIFIED RADICAL NECK DISSECTION Left 05/26/2018   Dr. Nicolette Bang, [facility]  . transoral robotic surgery  05/26/2018   [facility], Dr. Nicolette Bang [facility]    MEDICATIONS: Current Outpatient Medications on File Prior to Visit  Medication Sig Dispense Refill  . Acetaminophen (TYLENOL PO) Take by mouth.    Marland Kitchen apixaban (ELIQUIS) 5 MG TABS tablet 10mg  po bid for 7 days then 5mg  po bid , for 30days supply. 60 tablet 0  . CLINPRO 5000 1.1 % PSTE Place 1 application onto teeth as directed.    Marland Kitchen levothyroxine (SYNTHROID) 25 MCG tablet Take 1 tablet (25 mcg total) by mouth daily before breakfast. 30 tablet 5  . omeprazole (PRILOSEC) 20 MG capsule Take 1 capsule by mouth in the morning and at bedtime.    . pramipexole (MIRAPEX) 1 MG tablet Take 1 mg by mouth at bedtime.    . sodium fluoride (PREVIDENT 5000 PLUS) 1.1 % CREA dental cream Place 1  application onto teeth at bedtime. Apply to tooth brush. Brush teeth for 2 minutes. Spit out excess. DO NOT rinse afterwards. Repeat nightly.    . Zinc 50 MG TABS Take 1 tablet by mouth daily. (Patient not taking: Reported on 01/15/2020)     Current Facility-Administered Medications on File Prior to Visit  Medication Dose Route Frequency Provider Last Rate Last Admin  . heparin lock flush 100 unit/mL  500 Units Intravenous Once Ennever, Rudell Cobb, MD      . sodium chloride flush (NS) 0.9 % injection 10 mL  10 mL Intravenous PRN Ennever, Rudell Cobb, MD        ALLERGIES: No Known Allergies  FAMILY HISTORY: Family History  Problem Relation Age of Onset  . Hypertension Mother   . Prostate cancer Father      SOCIAL HISTORY: Social History   Socioeconomic History  . Marital status: Married    Spouse name: Not on file  . Number of children: 2  . Years of education: Not on file  . Highest education level: Not on file  Occupational History  . Not on file  Tobacco Use  . Smoking status: Never Smoker  . Smokeless tobacco: Never Used  Vaping Use  . Vaping Use: Never used  Substance and Sexual Activity  . Alcohol use: Not Currently  . Drug use: Never  . Sexual activity: Not on file  Other Topics Concern  . Not on file  Social History Narrative  . Not on file   Social Determinants of Health   Financial Resource Strain: Not on file  Food Insecurity: Not on file  Transportation Needs: Not on file  Physical Activity: Not on file  Stress: Not on file  Social Connections: Not on file  Intimate Partner Violence: Not on file    Objective:  Blood pressure 138/88, pulse 85, height 5\' 8"  (1.727 m), weight 186 lb 12.8 oz (84.7 kg), SpO2 94 %. General: No acute distress.  Patient appears well-groomed.   Head:  Normocephalic/atraumatic Eyes:  fundi examined but not visualized Neck: supple, no paraspinal tenderness, full range of motion Back: No paraspinal tenderness Heart: regular rate and rhythm Lungs: Clear to auscultation bilaterally. Vascular: No carotid bruits. Neurological Exam: Mental status: alert and oriented to person, place, and time, recent and remote memory intact, fund of knowledge intact, attention and concentration intact, speech fluent and not dysarthric, language intact. Cranial nerves: CN I: not tested CN II: pupils equal, round and reactive to light, visual fields intact CN III, IV, VI:  full range of motion, no nystagmus, no ptosis CN V: facial sensation intact. CN VII: upper and lower face symmetric CN VIII: hearing intact CN IX, X: gag intact, uvula midline CN XI: sternocleidomastoid and trapezius muscles intact CN XII: tongue midline Bulk & Tone: normal,  no fasciculations. Motor:  muscle strength 5/5 throughout Sensation:  Pinprick, temperature and vibratory sensation intact. Deep Tendon Reflexes:  2+ throughout,  toes downgoing.   Finger to nose testing:  Without dysmetria.   Heel to shin:  Without dysmetria.   Gait:  Normal station and stride.  Romberg negative.  Assessment/Plan:   Restless leg syndrome.  Concern for augmentation so plan would be to taper off of pramipexole. 1.  Start gabapentin 100mg  and titrate to 300mg  at bedtime.   2.  Continue pramipexole for now.  Once symptoms appear controlled, will start tapering off. 3.  Check ferritin level 4.  Follow up 6 months.    Thank you for  allowing me to take part in the care of this patient.  Shon Millet, DO  CC: Blair Heys, MD

## 2020-05-17 ENCOUNTER — Encounter: Payer: Self-pay | Admitting: Neurology

## 2020-05-17 ENCOUNTER — Ambulatory Visit (INDEPENDENT_AMBULATORY_CARE_PROVIDER_SITE_OTHER): Payer: Commercial Managed Care - PPO | Admitting: Neurology

## 2020-05-17 ENCOUNTER — Other Ambulatory Visit: Payer: Commercial Managed Care - PPO

## 2020-05-17 ENCOUNTER — Other Ambulatory Visit: Payer: Self-pay

## 2020-05-17 VITALS — BP 138/88 | HR 85 | Ht 68.0 in | Wt 186.8 lb

## 2020-05-17 DIAGNOSIS — G2581 Restless legs syndrome: Secondary | ICD-10-CM

## 2020-05-17 LAB — FERRITIN: Ferritin: 34.8 ng/mL (ref 22.0–322.0)

## 2020-05-17 MED ORDER — GABAPENTIN 100 MG PO CAPS: ORAL_CAPSULE | ORAL | 0 refills | Status: DC

## 2020-05-17 NOTE — Patient Instructions (Addendum)
1.  Start gabapentin 100mg .  Take 1 pill at bedtime for one week, then 2 pills at bedtime for one week, then 3 capsules at bedtime.  We can increase dose from there if needed. 2.  Continue pramipexole 1mg  at 7:45 PM for now.  Plan would be to eventually taper off 3.  Will check ferritin level. Your provider has requested that you have labwork completed today. Please go to [facility] ([id number]) on the second floor of this building before leaving the office today. You do not need to check in. If you are not called within 15 minutes please check with the front desk.  4.  Follow up 6 months.

## 2020-05-18 ENCOUNTER — Telehealth: Payer: Self-pay

## 2020-05-18 DIAGNOSIS — Z79899 Other long term (current) drug therapy: Secondary | ICD-10-CM

## 2020-05-18 NOTE — Telephone Encounter (Signed)
-----   Message from Adam R Jaffe, DO sent at 05/18/2020  9:06 AM EST ----- Ferritin level is a little low.  I would like her to start ferrous sulfate 325mg daily and recheck level in 3 months 

## 2020-05-18 NOTE — Telephone Encounter (Signed)
-----   Message from Drema Dallas, DO sent at 05/18/2020  9:06 AM EST ----- Ferritin level is a little low.  I would like her to start ferrous sulfate 325mg  daily and recheck level in 3 months

## 2020-05-18 NOTE — Telephone Encounter (Signed)
Spoke with pt and informed him that Ferritin level is a little low. Dr Everlena Cooper would like him to start ferrous sulfate 325mg  daily and recheck level in 3 months

## 2020-05-18 NOTE — Telephone Encounter (Signed)
LVM--to call the office back. 

## 2020-06-08 ENCOUNTER — Telehealth: Payer: Self-pay | Admitting: Neurology

## 2020-06-08 NOTE — Telephone Encounter (Signed)
Gabapentin, patient called requesting a call back from a nurse about weaning off pramipexole.

## 2020-06-09 NOTE — Telephone Encounter (Signed)
Pt advised,   he can cut the pramipexole in half and take that. If not, continue both for right this second.   Pt states he will try that and see how he does.

## 2020-06-09 NOTE — Telephone Encounter (Signed)
Is patient doing well with RLS?  IF so, he can cut the pramipexole in half and take that. If not, continue both for right this second.

## 2020-06-09 NOTE — Telephone Encounter (Signed)
Per Dr. Tomi Likens last ov note 04/2020: Concern for augmentation so plan would be to taper off of pramipexole. 1.  Start gabapentin 100mg  and titrate to 300mg  at bedtime.   2.  Continue pramipexole for now.  Once symptoms appear controlled, will start tapering off.   Please advise how to taper.

## 2020-06-23 ENCOUNTER — Other Ambulatory Visit: Payer: Self-pay | Admitting: Neurology

## 2020-08-17 ENCOUNTER — Ambulatory Visit
Admission: RE | Admit: 2020-08-17 | Discharge: 2020-08-17 | Disposition: A | Discharge status: Home / Self Care | Payer: Commercial Managed Care - PPO | Source: Ambulatory Visit | Attending: Radiation Oncology | Admitting: Radiation Oncology

## 2020-08-17 ENCOUNTER — Other Ambulatory Visit: Payer: Self-pay

## 2020-08-17 VITALS — BP 139/76 | HR 73 | Temp 97.3°F | Resp 18 | Ht 68.0 in | Wt 185.2 lb

## 2020-08-17 DIAGNOSIS — Z923 Personal history of irradiation: Secondary | ICD-10-CM | POA: Diagnosis not present

## 2020-08-17 DIAGNOSIS — C01 Malignant neoplasm of base of tongue: Secondary | ICD-10-CM

## 2020-08-17 DIAGNOSIS — C099 Malignant neoplasm of tonsil, unspecified: Secondary | ICD-10-CM

## 2020-08-17 DIAGNOSIS — C4442 Squamous cell carcinoma of skin of scalp and neck: Secondary | ICD-10-CM

## 2020-08-17 DIAGNOSIS — Z85810 Personal history of malignant neoplasm of tongue: Secondary | ICD-10-CM | POA: Diagnosis present

## 2020-08-17 DIAGNOSIS — C109 Malignant neoplasm of oropharynx, unspecified: Secondary | ICD-10-CM

## 2020-08-17 MED ORDER — OXYMETAZOLINE HCL 0.05 % NA SOLN
1.0000 | Freq: Two times a day (BID) | NASAL | Status: DC
  Administered 2020-08-17: 1 via NASAL
  Filled 2020-08-17: qty 30

## 2020-08-17 NOTE — Progress Notes (Signed)
Mr. Balsam presents for follow up of radiation completed 08/21/2018 to his tongue/head and neck  Pain issues, if any: Patient denies. Reports mild neck stiffness, but states it's manageable Using a feeding tube?: N/A Weight changes, if any:  Wt Readings from Last 3 Encounters:  08/17/20 185 lb 4 oz (84 kg)  05/17/20 186 lb 12.8 oz (84.7 kg)  03/07/20 184 lb 9.6 oz (83.7 kg)   Swallowing issues, if any: Patient denies. Reports he is able to eat a wide variety of food/beverages Smoking or chewing tobacco? None Using fluoride trays daily? Yes and sees a community dentist regularly Last ENT visit was on: 04/05/2020 Saw Dr. Francina Ames: "He has done well overall since the last visit --He denies pain or sore throat  --He is swallowing all types of foods . Remains with a little taste loss, still not quite to normal. --Denies hemoptysis, otalgia.  --No new palpable neck masses or enlarged lymph nodes, other than a small knot in the right posterior / occipital area. Scan a couple of weeks ago showed no findings here. There is no evidence of disease on exam today.  --He sees Dr Isidore Moos in March .  --Return in May , certainly sooner if there are any questions or problems.  --He is agreeable with this plan. All his questions were answered.   Other notable issues, if any: Reports occasional dry mouth and thick saliva. Denies any ear or jaw pain, or trouble opening his mouth fully. Denies any issues with swellng ot lymphedema to his jaw/neck. Denies any fatigue or issues sleeping (outside of his already exisiting restless leg syndrome). Overall reports he feels well and is pleased with his continued progress   Vitals:   08/17/20 1528  BP: 139/76  Pulse: 73  Resp: 18  Temp: (!) 97.3 F (36.3 C)  SpO2: 97%

## 2020-08-18 ENCOUNTER — Telehealth: Payer: Self-pay | Admitting: Hematology and Oncology

## 2020-08-18 NOTE — Telephone Encounter (Signed)
Received a new pt referral for Mr. Glenn Goodman to establish care with Dr. Chryl Heck for a 6 month f/u appt. Mr. Glenn Goodman has been cld and scheduled to see Dr. Chryl Heck on 9/7 at 1pm.

## 2020-08-19 ENCOUNTER — Encounter: Payer: Self-pay | Admitting: Radiation Oncology

## 2020-08-19 NOTE — Progress Notes (Signed)
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 Squamous cell carcinoma of left tonsil ([facility])  C09.9   2. 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 Malignant neoplasm of base of tongue ([facility])  C01 Fiberoptic laryngoscopy    DISCONTINUED: oxymetazoline (AFRIN) 0.05 % nasal spray 1 spray  4. Squamous cell carcinoma of neck  C44.42 Fiberoptic laryngoscopy    DISCONTINUED: oxymetazoline (AFRIN) 0.05 % nasal spray 1 spray    Radiation Treatment Dates: 07/07/2018 through 08/21/2018 Site Technique Total Dose Dose per Fx Completed Fx Beam Energies  Head & neck: HN_tongueb IMRT 66/66 2 33/33 6X   CHIEF COMPLAINT: follow up and surveillance of cancer   NARRATIVE:  Glenn Goodman presents for follow up of radiation completed 08/21/2018 to his tongue/head and neck  Pain issues, if any: Patient denies. Reports mild neck stiffness, but states it's manageable Using a feeding tube?: N/A Weight changes, if any:  Wt Readings from Last 3 Encounters:  08/17/20 185 lb 4 oz (84 kg)  05/17/20 186 lb 12.8 oz (84.7 kg)  03/07/20 184 lb 9.6 oz (83.7 kg)   Swallowing issues, if any: Patient denies. Reports  he is able to eat a wide variety of food/beverages Smoking or chewing tobacco? None Using fluoride trays daily? Yes and sees a community dentist regularly Last ENT visit was on: 04/05/2020 Saw Dr. Francina Ames: "He has done well overall since the last visit --He denies pain or sore throat  --He is swallowing all types of foods . Remains with a little taste loss, still not quite to normal. --Denies hemoptysis, otalgia.  --No new palpable neck masses or enlarged lymph nodes, other than a small knot in the right posterior / occipital area. Scan a couple of weeks ago showed no findings here. There is no evidence of disease on exam today.  --He sees Dr Isidore Moos in March .  --Return in May , certainly sooner if there are any questions or problems.  --He is agreeable with this plan. All his questions were answered.   Other notable issues, if any: Reports occasional dry mouth and thick saliva. Denies any ear or jaw pain, or trouble opening his mouth fully. Denies any issues with swellng ot lymphedema to his jaw/neck. Denies any fatigue or issues sleeping (outside of his already exisiting restless leg syndrome). Overall reports he feels well and is pleased with his continued progress He plans to retire later this year.  His wife is present by FaceTime today.  Vitals:   08/17/20 1528  BP: 139/76  Pulse: 73  Resp: 18  Temp: (!) 97.3 F (36.3 C)  SpO2: 97%     ALLERGIES:  has No Known Allergies.  Meds: Current Outpatient Medications  Medication Sig Dispense Refill  . Acetaminophen (TYLENOL PO) Take by mouth.    Marland Kitchen apixaban (ELIQUIS) 5 MG TABS tablet 10mg  po bid for 7 days then 5mg  po bid , for 30days supply. 60 tablet 0  . CLINPRO 5000 1.1 % PSTE Place 1 application onto teeth as directed.    . gabapentin (NEURONTIN) 100 MG capsule Take  3 capsules at bedtime 270 capsule 0  . levothyroxine (SYNTHROID) 50 MCG tablet Take 50 mcg by mouth every morning.    Marland Kitchen omeprazole (PRILOSEC) 20 MG capsule  Take 1 capsule by mouth in the morning and at bedtime.    . pramipexole (MIRAPEX) 1 MG tablet Take 1 mg by mouth at bedtime.    . sodium fluoride (PREVIDENT 5000 PLUS) 1.1 % CREA dental cream Place 1 application onto teeth at bedtime. Apply to tooth brush. Brush teeth for 2 minutes. Spit out excess. DO NOT rinse afterwards. Repeat nightly.    . Zinc 50 MG TABS Take 1 tablet by mouth daily. (Patient not taking: No sig reported)     No current facility-administered medications for this encounter.   Facility-Administered Medications Ordered in Other Encounters  Medication Dose Route Frequency Provider Last Rate Last Admin  . heparin lock flush 100 unit/mL  500 Units Intravenous Once Ennever, Rudell Cobb, MD      . sodium chloride flush (NS) 0.9 % injection 10 mL  10 mL Intravenous PRN Volanda Napoleon, MD        Physical Findings: The patient is in no acute distress. Patient is alert and oriented. Wt Readings from Last 3 Encounters:  08/17/20 185 lb 4 oz (84 kg)  05/17/20 186 lb 12.8 oz (84.7 kg)  03/07/20 184 lb 9.6 oz (83.7 kg)    height is 5\' 8"  (1.727 m) and weight is 185 lb 4 oz (84 kg). His temporal temperature is 97.3 F (36.3 C) (abnormal). His blood pressure is 139/76 and his pulse is 73. His respiration is 18 and oxygen saturation is 97%. .   General: Alert and oriented, in no acute distress HEENT: Head is normocephalic. Extraocular movements are intact. Oropharynx is clear.  No palpable tumor throughout the tongue Neck: Neck is supple, no palpable cervical or supraclavicular lymphadenopathy.   Skin: No concerning neck lesions. Heart regular in rate and rhythm Chest clear to auscultation bilaterally Abdomen soft nontender nondistended Neurologic: Cranial nerves II through XII are grossly intact. No obvious focalities. Speech is fluent. Coordination is intact.  Ambulatory. Psychiatric: Judgment and insight are intact. Affect is appropriate.  PROCEDURE NOTE: After obtaining consent  and anesthetizing the nasal cavity with topical oxymetazoline, the flexible endoscope was introduced and passed through the nasal cavity.  The nasopharynx, oropharynx, larynx, and hypopharynx were then examined.  No lesions visible in the nasopharynx oropharynx hypopharynx or larynx.  Cords are symmetrically mobile   Lab Findings: Lab Results  Component Value Date   WBC 6.4 03/07/2020   HGB 16.1 03/07/2020   HCT 47.6 03/07/2020   MCV 92.8 03/07/2020   PLT 236 03/07/2020    Lab Results  Component Value Date   TSH 9.425 (H) 03/07/2020    Radiographic Findings: No results found.  Impression/Plan:    1) Head and Neck Cancer Status: NED  2) Nutritional Status: good PEG tube: removed  3) Swallowing: as tolerated, continue SLP exercises  5) Dental: Encouraged to continue regular followup with dentistry, and dental hygiene    6) Thyroid function: He  has some hypothyroidism which is currently managed with levothyroxine, prescribed and followed by another physician  7)  Continue f/u w/ ENT and med onc; given that he is doing so well at the 2-year mark post treatment, I will see him back on an as-needed basis.  He knows to call at anytime if he has questions or concerns and otherwise I wished him the very best.  8)  We discussed measures to reduce the risk of infection during the COVID-19 pandemic.  He has been vaccinated and boosted.   On date of service, in total, I spent 25 minutes on this encounter. Patient was seen in person.   _____________________________________   Eppie Gibson, MD

## 2020-09-23 ENCOUNTER — Telehealth: Payer: Self-pay | Admitting: Neurology

## 2020-09-23 NOTE — Telephone Encounter (Signed)
Patient needs a refill on his gabapentin and would like to see if it is a good time to increase the gabapentin so he can [place] himself off the pramipexole?

## 2020-09-23 NOTE — Telephone Encounter (Signed)
Telephone call to pt, pt would like to increase his Gabapentin.   Per pt when he is not on the Mirapex and just taking the Gabapentin RLS symptoms are worse.    Please advise pt is out of the Gabapentin now.

## 2020-09-25 NOTE — Telephone Encounter (Signed)
Please increase gabapentin to 600mg  at bedtime.

## 2020-09-26 MED ORDER — GABAPENTIN 600 MG PO TABS: 600.0000 mg | ORAL_TABLET | Freq: Every day | ORAL | 1 refills | Status: DC

## 2020-09-26 NOTE — Telephone Encounter (Signed)
Advised pt Script sent to the pharmacy.

## 2020-10-20 ENCOUNTER — Other Ambulatory Visit: Payer: Self-pay | Admitting: Neurology

## 2020-11-14 NOTE — Progress Notes (Signed)
NEUROLOGY FOLLOW UP OFFICE NOTE  [name] [phone]  Assessment/Plan:   Restless Leg Syndrome  Gabapentin 600mg  at bedtime Continue ferrous sulfate.  Check ferritin level.  If within normal range, would discontinue.   Follow up 6 months.  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  UPDATE: Current medication:  gabapentin 600mg  QHS; ferrous sulfate 325mg  daily  Ferritin level was found to be 34.8.  He was advised to start ferrous sulfate 325mg  daily.   Due to concern for augmentation, plan was to taper off of pramipexole.  He was started on gabapentin titrating to 300mg  at bedtime and subsequently tapered off of pramipexole.  Symptoms became worse and gabapentin was increased to 600mg  at bedtime in May.  He is doing better.  Incidentally, since starting gabapentin, he is able to take a nap which he enjoys.     HISTORY: He has been dealing with RLS for 10 years.  It affects either on leg or the other but usually not affected at the same time.  Over the course of the past several years, he has increased pramipexole from 0.25mg  to 1mg .  He has steadily had breakthrough symptoms at night and his PCP wouldn't increase the dose unless he was seen by a neurologist.  He takes the pramipexole at 7:45 PM and gets into bed at around 10PM.  Normally he can sleep through the night, but previously was having some breakthrough attacks.  He is unable to nap during the day because the symptoms affect him.  Over the past year, he has had some symptoms in the arms as well.  He has OSA and uses a CPAP   He is not aware of family history of RLS.  Past medications:  Ropinirole 1mg  (ineffective), pramipexole 1mg  QHS  PAST MEDICAL HISTORY: Past Medical History:  Diagnosis Date   Arthritis    Cancer ([place])    OSA on CPAP     Restless leg syndrome     MEDICATIONS: Current Outpatient Medications on File Prior to Visit  Medication Sig Dispense Refill   Acetaminophen (TYLENOL PO) Take by mouth.     apixaban (ELIQUIS) 5 MG TABS tablet 10mg  po bid for 7 days then 5mg  po bid , for 30days supply. 60 tablet 0   CLINPRO 5000 1.1 % PSTE Place 1 application onto teeth as directed.     gabapentin (NEURONTIN) 600 MG tablet Take 1 tablet (600 mg total) by mouth at bedtime. 30 tablet 1   levothyroxine (SYNTHROID) 50 MCG tablet Take 50 mcg by mouth every morning.     omeprazole (PRILOSEC) 20 MG capsule Take 1 capsule by mouth in the morning and at bedtime.     pramipexole (MIRAPEX) 1 MG tablet Take 1 mg by mouth at bedtime.     sodium fluoride (PREVIDENT 5000 PLUS) 1.1 % CREA dental cream Place 1 application onto teeth at bedtime. Apply to tooth brush. Brush teeth for 2 minutes. Spit out excess. DO NOT rinse afterwards. Repeat nightly.     Zinc 50 MG TABS Take 1 tablet by mouth daily. (Patient not taking: No sig reported)     Current Facility-Administered Medications on File Prior to Visit  Medication Dose Route Frequency Provider Last Rate Last Admin   heparin lock flush 100 unit/mL  500 Units Intravenous Once Burney Gauze  R, MD       sodium chloride flush (NS) 0.9 % injection 10 mL  10 mL Intravenous PRN Ennever, Rudell Cobb, MD        ALLERGIES: No Known Allergies  FAMILY HISTORY: Family History  Problem Relation Age of Onset   Hypertension Mother    Prostate cancer Father       Objective:  Blood pressure 124/77, pulse 69, height 5\' 8"  (1.727 m), weight 184 lb (83.5 kg), SpO2 97 %. General: No acute distress.  Patient appears well-groomed.     Metta Clines, DO  CC: Glenn Arabian, MD

## 2020-11-15 ENCOUNTER — Encounter: Payer: Self-pay | Admitting: Neurology

## 2020-11-15 ENCOUNTER — Other Ambulatory Visit: Payer: Self-pay

## 2020-11-15 ENCOUNTER — Ambulatory Visit (INDEPENDENT_AMBULATORY_CARE_PROVIDER_SITE_OTHER): Payer: Commercial Managed Care - PPO | Admitting: Neurology

## 2020-11-15 ENCOUNTER — Other Ambulatory Visit (INDEPENDENT_AMBULATORY_CARE_PROVIDER_SITE_OTHER): Payer: Commercial Managed Care - PPO

## 2020-11-15 VITALS — BP 124/77 | HR 69 | Ht 68.0 in | Wt 184.0 lb

## 2020-11-15 DIAGNOSIS — G2581 Restless legs syndrome: Secondary | ICD-10-CM | POA: Diagnosis not present

## 2020-11-15 LAB — FERRITIN: Ferritin: 96.2 ng/mL (ref 22.0–322.0)

## 2020-11-15 NOTE — Patient Instructions (Signed)
Continue gabapentin 600mg  at bedtime Check ferritin level.  If okay, I will have you stop ferrous sulfate Follow up 6 months

## 2020-11-16 ENCOUNTER — Telehealth: Payer: Self-pay

## 2020-11-16 NOTE — Telephone Encounter (Signed)
-----   Message from Pieter Partridge, DO sent at 11/15/2020 12:36 PM EDT ----- Ferritin level is at an appropriate level.  Patient may try discontinuing ferrous sulfate for now.

## 2020-11-16 NOTE — Telephone Encounter (Signed)
Pt called and informed that Ferritin level is at an appropriate level.  Patient may try discontinuingferrous sulfate for now

## 2020-11-19 ENCOUNTER — Other Ambulatory Visit: Payer: Self-pay | Admitting: Neurology

## 2021-01-24 NOTE — Progress Notes (Signed)
[facility] NOTE  Patient Care Team: [name], MD as PCP - General (Family Medicine) Melissa Montane, MD as Consulting Physician (Otolaryngology) Eppie Gibson, MD as Attending Physician (Radiation Oncology) Tish Men, MD (Inactive) as Consulting Physician (Hematology) Leota Sauers, RN (Inactive) as Oncology Nurse Navigator Schinke, Perry Mount, CCC-SLP as Speech Language Pathologist (Speech Pathology) Karie Mainland, RD as Dietitian (Nutrition) Wynelle Beckmann, Melodie Bouillon, PT as Physical Therapist (Physical Therapy) Kennith Center, LCSW as Social Worker Francina Ames, MD as Referring Physician (Otolaryngology) Pieter Partridge, DO as Consulting Physician (Neurology)  CHIEF COMPLAINTS/PURPOSE OF CONSULTATION:  SCC left tonsil, surveillance, following up with Oncology  ASSESSMENT & PLAN:   This is a very pleasant 60 year old male patient with 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 Talitha Givens now establishing with head and neck clinic again. He had his last ENT appointment with Dr. Silvio Clayman in May 2022 with no concerns for recurrence.  He has been doing really well.  No concerning review of systems. Physical examination with no concerns. At this time he continues to be in remission clinically and will continue follow-up with medical oncology about once a year.  He has acquired hypothyroidism has been managed by his PCP and is currently on 50 mcg of levothyroxine. He should continue ENT surveillance which is the most important part of follow-up for head and neck cancers. I will try to reengage him with the survivorship clinic and we plan to follow-up with him once a year.  He was encouraged to contact us with any new questions or concerns. Age-appropriate cancer screening advised, he does not qualify for genetic  testing at this time but he has annual prostate exam and PSA by his PCP. Thank you for consulting [place] in the care of this patient.  Please not hesitate to contact us with additional questions or concerns  HISTORY OF PRESENTING ILLNESS:  VALERIAN RILL 60 y.o. male is here because of SCC left tonsil.  Oncology History  Squamous cell carcinoma of left tonsil ([place])  03/20/2018 Procedure   FNA of the left cervical LN in office   03/20/2018 Pathology Results   Non-diagnostic   04/07/2018 Procedure   US-guided left cervical LN biopsy/FNA    04/07/2018 Pathology Results   (Accession: [id number]) Lymph node, needle/core biopsy, left cervical - METASTATIC SQUAMOUS CELL CARCINOMA INVOLVING A LYMPH NODE (1/1) - SEE COMMENT  p16 IHC positive. EBV ISH negative.    04/22/2018 Pathology Results   (Accession: [id number]) Tonsil, biopsy, left, base of tongue - INVASIVE KERATINIZING SQUAMOUS CELL CARCINOMA.   04/29/2018 Imaging   PET:  IMPRESSION: 1. Left palatine tonsil/adjacent pharyngeal mucosal space lesion is asymmetric, measures about 1.3 cm in long axis, and has a maximum SUV of 8.2, compatible with malignancy. There is associated hypermetabolic left level IIa adenopathy as shown on prior exams. 2. Symmetric accentuated activity in the lymphoid tissue at the tongue base, maximum SUV 5.3, quite likely physiologic given the symmetry.  3. No findings of metastatic disease Pred to the chest, abdomen/pelvis, or skeleton.   05/26/2018 Surgery   Radical tonsillectomy   05/26/2018 Pathology Results   PROCEDURE:  Radical tonsillectomy TUMOR SITE:  Glossotonsillar sulcus TUMOR LATERALITY:  LEFT TUMOR FOCALITY:  Unifocal TUMOR SIZE:  GREATEST DIMENSION:  1.6 cm HISTOLOGIC TYPE:  HPV-mediated squamous cell carcinoma MARGINS:  Involved by invasive tumor (focal) Specify  location of closest margin:  Tongue base (soft tissue) Note:  Although an additional tongue base mucosal margin was submitted,  location of tumor extension to margin appears 1 cm from the mucosal surface. LYMPHOVASCULAR INVASION:  Not identified PERINEURAL INVASION:  Present REGIONAL LYMPH NODES:   NUMBER OF LYMPH NODES INVOLVED:  1   NUMBER OF LYMPH NODES EXAMINED:  6   LATERALITY OF LYMPH NODES INVOLVED:  Ipsilateral   SIZE OF LARGEST METASTATIC DEPOSIT:  6.8 cm   EXTRANODAL EXTENSION:  Present     Distance from lymph node capsule:  6 millimeters PATHOLOGIC STAGE CLASSIFICATION (pTNM, AJCC 8TH Ed):  pT1 pN1 (HPV-mediated oropharynx tumor)   pT1:  Tumor 2 cm or smaller in greatest dimension   pN1:  Metastasis in 4 or fewer lymph nodes ANCILLARY STUDIES:   p16 expression by immunohistochemistry:  Positive   HPV by PCR: pending   05/26/2018 Cancer Staging   Staging form: Pharynx - HPV-Mediated Oropharynx, AJCC 8th Edition - Pathologic stage from 05/26/2018: Stage I (pT1, pN1, cM0, p16+) - Signed by Tish Men, MD on 06/18/2018   07/10/2018 - 07/10/2018 Chemotherapy   The patient had dexamethasone (DECADRON) 4 MG tablet, 1 of 1 cycle, Start date: 06/18/2018, End date: 06/23/2018 palonosetron (ALOXI) injection 0.25 mg, 0.25 mg, Intravenous,  Once, 0 of 3 cycles CISplatin (PLATINOL) 198 mg in sodium chloride 0.9 % 500 mL chemo infusion, 100 mg/m2 = 198 mg, Intravenous,  Once, 0 of 3 cycles fosaprepitant (EMEND) 150 mg, dexamethasone (DECADRON) 12 mg in sodium chloride 0.9 % 145 mL IVPB, , Intravenous,  Once, 0 of 3 cycles   for chemotherapy treatment.     07/10/2018 - 08/14/2018 Chemotherapy   The patient had dexamethasone (DECADRON) 4 MG tablet, 1 of 1 cycle, Start date: 06/23/2018, End date: 08/27/2018 palonosetron (ALOXI) injection 0.25 mg, 0.25 mg, Intravenous,  Once, 6 of 6 cycles Administration: 0.25 mg (07/10/2018), 0.25 mg (07/18/2018), 0.25 mg (07/24/2018), 0.25 mg (07/31/2018), 0.25 mg (08/07/2018), 0.25 mg (08/14/2018) CISplatin (PLATINOL) 79 mg in sodium chloride 0.9 % 250 mL chemo infusion, 40 mg/m2 = 79 mg, Intravenous,   Once, 6 of 6 cycles Administration: 79 mg (07/10/2018), 79 mg (07/18/2018), 79 mg (07/24/2018), 79 mg (07/31/2018), 79 mg (08/07/2018), 79 mg (08/14/2018) fosaprepitant (EMEND) 150 mg, dexamethasone (DECADRON) 12 mg in sodium chloride 0.9 % 145 mL IVPB, , Intravenous,  Once, 6 of 6 cycles Administration:  (07/10/2018),  (07/18/2018),  (07/24/2018),  (07/31/2018),  (08/07/2018),  (08/14/2018)   for chemotherapy treatment.     10/20/2018 Imaging   CT neck w/ contrast: IMPRESSION: Interval left tonsillectomy and neck dissection. No evidence of residual mass in the left tonsillar region. Postsurgical distortion of the left neck, without evidence of residual or recurrent tumor.   I would categorize this as NI RADS 1 based on the CT, but this could be altered based on follow-up PET.   10/20/2018 Imaging   CT chest w/ contrast: IMPRESSION: No evidence of metastatic disease in the chest.   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[place])  05/09/2018 Initial Diagnosis   Malignant neoplasm of base of tongue ([place])   05/09/2018 Cancer Staging   Staging form: Pharynx - HPV-Mediated Oropharynx, AJCC 8th Edition - Clinical: Stage I (cT1, cN1, cM0, p16+) - Signed by Eppie Gibson, MD on 05/09/2018   06/11/2018 Cancer Staging   Staging form: Pharynx - HPV-Mediated Oropharynx, AJCC 8th Edition - Pathologic: Stage I (pT1, pN1, cM0, p16+) - Signed by Eppie Gibson, MD on 06/11/2018    This  is a pleasant 60 year old male patient with a prior diagnosis of T1 N1 M0 p16 positive SCC left tonsil previously treated by Dr. Vertell Novak and Dr. Isidore Moos who presents to the clinic for follow-up. Last visit with Dr.Waltonen on Oct 04, 2020.  No evidence of disease on exam in May according to him. Patient is here for follow-up.  He has been doing really well.  No speech changes, swallowing changes.  He does have some discomfort in the left neck given his surgery and postop radiation.  His weight has been really good, indeed he wants to lose some weight.  He  is up-to-date with his age-appropriate cancer screening.  He has a follow-up with ENT in 6 months.  His primary doctor has been helping him manage the hypothyroidism. Rest of the pertinent 10 point ROS reviewed and negative.  REVIEW OF SYSTEMS:   Constitutional: Denies fevers, chills or abnormal night sweats Eyes: Denies blurriness of vision, double vision or watery eyes Ears, nose, mouth, throat, and face: Denies mucositis or sore throat Respiratory: Denies cough, dyspnea or wheezes Cardiovascular: Denies palpitation, chest discomfort or lower extremity swelling Gastrointestinal:  Denies nausea, heartburn or change in bowel habits Skin: Denies abnormal skin rashes Lymphatics: Denies new lymphadenopathy or easy bruising Neurological:Denies numbness, tingling or new weaknesses Behavioral/Psych: Mood is stable, no new changes  All other systems were reviewed with the patient and are negative.  MEDICAL HISTORY:  Past Medical History:  Diagnosis Date   Arthritis    Cancer ([place])    OSA on CPAP    Restless leg syndrome     SURGICAL HISTORY: Past Surgical History:  Procedure Laterality Date   DIRECT LARYNGOSCOPY Left 04/22/2018   Dr. Janace Hoard   FACIAL FRACTURE SURGERY Left 1986   IR ANGIOGRAM PULMONARY BILATERAL SELECTIVE  08/22/2018   IR ANGIOGRAM SELECTIVE EACH ADDITIONAL VESSEL  08/22/2018   IR ANGIOGRAM SELECTIVE EACH ADDITIONAL VESSEL  08/22/2018   IR GASTROSTOMY TUBE MOD SED  06/30/2018   IR GASTROSTOMY TUBE REMOVAL  01/06/2019   IR IMAGING GUIDED PORT INSERTION  06/30/2018   IR INFUSION THROMBOL ARTERIAL INITIAL (MS)  08/22/2018   IR INFUSION THROMBOL ARTERIAL INITIAL (MS)  08/22/2018   IR REMOVAL TUN ACCESS W/ PORT W/O FL MOD SED  11/28/2018   IR THROMB F/U EVAL ART/VEN FINAL DAY (MS)  08/23/2018   IR US GUIDE VASC ACCESS RIGHT  08/22/2018   MODIFIED RADICAL NECK DISSECTION Left 05/26/2018   Dr. Nicolette Bang, [facility]   transoral robotic surgery  05/26/2018   [facility], Dr. Nicolette Bang [facility]    SOCIAL  HISTORY: Social History   Socioeconomic History   Marital status: Married    Spouse name: Not on file   Number of children: 2   Years of education: Not on file   Highest education level: Not on file  Occupational History   Not on file  Tobacco Use   Smoking status: Never   Smokeless tobacco: Never  Vaping Use   Vaping Use: Never used  Substance and Sexual Activity   Alcohol use: Not Currently   Drug use: Never   Sexual activity: Not on file  Other Topics Concern   Not on file  Social History Narrative   Right handed   Lives in a two story home   Drinks coffee and tea    Social Determinants of Health   Financial Resource Strain: Not on file  Food Insecurity: Not on file  Transportation Needs: Not on file  Physical Activity: Not on  file  Stress: Not on file  Social Connections: Not on file  Intimate Partner Violence: Not on file    FAMILY HISTORY: Family History  Problem Relation Age of Onset   Hypertension Mother    Prostate cancer Father     ALLERGIES:  has No Known Allergies.  MEDICATIONS:  Current Outpatient Medications  Medication Sig Dispense Refill   Acetaminophen (TYLENOL PO) Take by mouth.     apixaban (ELIQUIS) 5 MG TABS tablet '10mg'$  po bid for 7 days then '5mg'$  po bid , for 30days supply. 60 tablet 0   CLINPRO 5000 1.1 % PSTE Place 1 application onto teeth as directed.     gabapentin (NEURONTIN) 600 MG tablet TAKE 1 TABLET BY MOUTH AT BEDTIME. 30 tablet 4   levothyroxine (SYNTHROID) 50 MCG tablet Take 50 mcg by mouth every morning.     omeprazole (PRILOSEC) 20 MG capsule Take 1 capsule by mouth in the morning and at bedtime.     sodium fluoride (PREVIDENT 5000 PLUS) 1.1 % CREA dental cream Place 1 application onto teeth at bedtime. Apply to tooth brush. Brush teeth for 2 minutes. Spit out excess. DO NOT rinse afterwards. Repeat nightly.     Zinc 50 MG TABS Take 1 tablet by mouth daily.     ferrous sulfate 325 (65 FE) MG tablet Take 325 mg by mouth daily  with breakfast. (Patient not taking: Reported on 01/25/2021)     pramipexole (MIRAPEX) 1 MG tablet Take 1 mg by mouth at bedtime. (Patient not taking: Reported on 01/25/2021)     No current facility-administered medications for this visit.   Facility-Administered Medications Ordered in Other Visits  Medication Dose Route Frequency Provider Last Rate Last Admin   heparin lock flush 100 unit/mL  500 Units Intravenous Once Ennever, Rudell Cobb, MD       sodium chloride flush (NS) 0.9 % injection 10 mL  10 mL Intravenous PRN Ennever, Rudell Cobb, MD        PHYSICAL EXAMINATION: ECOG PERFORMANCE STATUS: 0 - Asymptomatic  Vitals:   01/25/21 1306  BP: 129/72  Pulse: 63  Resp: 18  Temp: 98.4 F (36.9 C)  SpO2: 96%   Filed Weights   01/25/21 1306  Weight: 178 lb 8 oz (81 kg)    GENERAL:alert, no distress and comfortable SKIN: skin color, texture, turgor are normal, no rashes or significant lesions EYES: normal, conjunctiva are pink and non-injected, sclera clear OROPHARYNX:no exudate, no erythema and lips, buccal mucosa, and tongue normal.  No mass in the tonsillar area. NECK: supple, thyroid normal size, non-tender, without nodularity LYMPH:  no palpable lymphadenopathy in the cervical, axillary LUNGS: clear to auscultation and percussion with normal breathing effort HEART: regular rate & rhythm and no murmurs and no lower extremity edema ABDOMEN:abdomen soft, non-tender and normal bowel sounds Musculoskeletal:no cyanosis of digits and no clubbing  PSYCH: alert & oriented x 3 with fluent speech NEURO: no focal motor/sensory deficits  LABORATORY DATA:  I have reviewed the data as listed Lab Results  Component Value Date   WBC 6.4 03/07/2020   HGB 16.1 03/07/2020   HCT 47.6 03/07/2020   MCV 92.8 03/07/2020   PLT 236 03/07/2020     Chemistry      Component Value Date/Time   NA 143 03/07/2020 1009   K 4.0 03/07/2020 1009   CL 106 03/07/2020 1009   CO2 27 03/07/2020 1009   BUN 17  03/07/2020 1009   CREATININE 1.07 03/07/2020 1009  Component Value Date/Time   CALCIUM 9.3 03/07/2020 1009   ALKPHOS 83 03/07/2020 1009   AST 15 03/07/2020 1009   ALT 21 03/07/2020 1009   BILITOT 0.3 03/07/2020 1009       RADIOGRAPHIC STUDIES: I have personally reviewed the radiological images as listed and agreed with the findings in the report. No results found.  All questions were answered. The patient knows to call the clinic with any problems, questions or concerns. I spent 45 minutes in the care of this patient including H and P, review of records, counseling and coordination of care.  I have reviewed all pertinent records, discussed about surveillance recommendations and follow-up recommendations.     Benay Pike, MD 01/25/2021 1:58 PM

## 2021-01-25 ENCOUNTER — Other Ambulatory Visit: Payer: Self-pay

## 2021-01-25 ENCOUNTER — Other Ambulatory Visit: Payer: Self-pay | Admitting: Sports Medicine

## 2021-01-25 ENCOUNTER — Encounter: Payer: Self-pay | Admitting: Hematology and Oncology

## 2021-01-25 ENCOUNTER — Inpatient Hospital Stay: Payer: Commercial Managed Care - PPO | Attending: Hematology and Oncology | Admitting: Hematology and Oncology

## 2021-01-25 ENCOUNTER — Ambulatory Visit
Admission: RE | Admit: 2021-01-25 | Discharge: 2021-01-25 | Disposition: A | Discharge status: Home / Self Care | Payer: Commercial Managed Care - PPO | Source: Ambulatory Visit | Attending: Sports Medicine | Admitting: Sports Medicine

## 2021-01-25 VITALS — BP 129/72 | HR 63 | Temp 98.4°F | Resp 18 | Wt 178.5 lb

## 2021-01-25 DIAGNOSIS — M25561 Pain in right knee: Secondary | ICD-10-CM

## 2021-01-25 DIAGNOSIS — E039 Hypothyroidism, unspecified: Secondary | ICD-10-CM | POA: Diagnosis not present

## 2021-01-25 DIAGNOSIS — C099 Malignant neoplasm of tonsil, unspecified: Secondary | ICD-10-CM

## 2021-01-25 DIAGNOSIS — M25562 Pain in left knee: Secondary | ICD-10-CM

## 2021-03-21 ENCOUNTER — Other Ambulatory Visit: Payer: Self-pay | Admitting: Sports Medicine

## 2021-03-21 ENCOUNTER — Ambulatory Visit
Admission: RE | Admit: 2021-03-21 | Discharge: 2021-03-21 | Disposition: A | Discharge status: Home / Self Care | Payer: Commercial Managed Care - PPO | Source: Ambulatory Visit | Attending: Sports Medicine | Admitting: Sports Medicine

## 2021-03-21 DIAGNOSIS — M25521 Pain in right elbow: Secondary | ICD-10-CM

## 2021-04-24 ENCOUNTER — Other Ambulatory Visit: Payer: Self-pay | Admitting: Neurology

## 2021-05-17 NOTE — Progress Notes (Signed)
Virtual Visit via Video Note The purpose of this virtual visit is to provide medical care while limiting exposure to the novel coronavirus.    Consent was obtained for video visit:  Yes.   Answered questions that patient had about telehealth interaction:  Yes.   I discussed the limitations, risks, security and privacy concerns of performing an evaluation and management service by telemedicine. I also discussed with the patient that there may be a patient responsible charge related to this service. The patient expressed understanding and agreed to proceed.  Pt location: Home Physician Location: office Name of referring provider:  [name], MD I connected with Glenn Gleason Davenport at patients initiation/request on 05/18/2021 at 11:10 AM EST by video enabled telemedicine application and verified that I am speaking with the correct person using two identifiers. Pt MRN:  [id number] Pt DOB:  10-07-1960 Video Participants:  Glenn Goodman  Assessment and Plan:   Restless leg syndrome  Gabapentin 600mg  at bedtime Follow up 7 months.  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   UPDATE: Current medication:  gabapentin 600mg  QHS   Repeat ferritin level on 11/15/2020 was 96.2.  Ferrous sulfate was discontinued.  He is currently doing well.  Restless leg symptoms have been controlled.  Once in awhile, he may have flare up of symptoms (maybe    HISTORY: He has been dealing with RLS for 10 years.  It affects either on leg or the other but usually not affected at the same time.  Over the course of the past several years, he has increased pramipexole from 0.25mg  to 1mg .  He has steadily had breakthrough symptoms at night and his PCP wouldn't increase the dose unless he was seen by a neurologist.  He takes the  pramipexole at 7:45 PM and gets into bed at around 10PM.  Normally he can sleep through the night, but previously was having some breakthrough attacks.  He is unable to nap during the day because the symptoms affect him.  Over the past year, he has had some symptoms in the arms as well.  He has OSA and uses a CPAP   He is not aware of family history of RLS.   Past medications:  Ropinirole 1mg  (ineffective), pramipexole 1mg  QHS  Past Medical History: Past Medical History:  Diagnosis Date   Arthritis    Cancer ([place])    OSA on CPAP    Restless leg syndrome     Medications: Outpatient Encounter Medications as of 05/18/2021  Medication Sig   Acetaminophen (TYLENOL PO) Take by mouth.   apixaban (ELIQUIS) 5 MG TABS tablet 10mg  po bid for 7 days then 5mg  po bid , for 30days supply.   CLINPRO 5000 1.1 % PSTE Place 1 application onto teeth as directed.   gabapentin (NEURONTIN) 600 MG tablet TAKE 1 TABLET BY MOUTH AT BEDTIME   levothyroxine (SYNTHROID) 50 MCG tablet Take 50 mcg by mouth every morning.   omeprazole (PRILOSEC) 20 MG capsule Take 1 capsule by mouth in the morning and at bedtime.   Zinc 50 MG TABS Take 1 tablet by mouth daily.   ferrous sulfate 325 (65 FE) MG tablet Take 325 mg by mouth daily with breakfast. (Patient not taking: Reported on 01/25/2021)   pramipexole (MIRAPEX) 1 MG tablet Take 1  mg by mouth at bedtime. (Patient not taking: Reported on 01/25/2021)   sodium fluoride (PREVIDENT 5000 PLUS) 1.1 % CREA dental cream Place 1 application onto teeth at bedtime. Apply to tooth brush. Brush teeth for 2 minutes. Spit out excess. DO NOT rinse afterwards. Repeat nightly. (Patient not taking: Reported on 05/18/2021)   Facility-Administered Encounter Medications as of 05/18/2021  Medication   heparin lock flush 100 unit/mL   sodium chloride flush (NS) 0.9 % injection 10 mL    Allergies: No Known Allergies  Family History: Family History  Problem Relation Age of Onset    Hypertension Mother    Prostate cancer Father     Observations/Objective:   There were no vitals taken for this visit. No acute distress.  Alert and oriented.  Speech fluent and not dysarthric.  Language intact.     Follow Up Instructions:    -I discussed the assessment and treatment plan with the patient. The patient was provided an opportunity to ask questions and all were answered. The patient agreed with the plan and demonstrated an understanding of the instructions.   The patient was advised to call back or seek an in-person evaluation if the symptoms worsen or if the condition fails to improve as anticipated.  Glenn Major, DO

## 2021-05-18 ENCOUNTER — Encounter: Payer: Self-pay | Admitting: Neurology

## 2021-05-18 ENCOUNTER — Other Ambulatory Visit: Payer: Self-pay

## 2021-05-18 ENCOUNTER — Telehealth (INDEPENDENT_AMBULATORY_CARE_PROVIDER_SITE_OTHER): Payer: Commercial Managed Care - PPO | Admitting: Neurology

## 2021-05-18 DIAGNOSIS — G2581 Restless legs syndrome: Secondary | ICD-10-CM

## 2021-05-24 ENCOUNTER — Other Ambulatory Visit: Payer: Self-pay | Admitting: Neurology

## 2021-06-13 ENCOUNTER — Encounter: Payer: Self-pay | Admitting: Hematology and Oncology

## 2021-06-26 DIAGNOSIS — G4733 Obstructive sleep apnea (adult) (pediatric): Secondary | ICD-10-CM | POA: Diagnosis not present

## 2021-07-11 DIAGNOSIS — Z125 Encounter for screening for malignant neoplasm of prostate: Secondary | ICD-10-CM | POA: Diagnosis not present

## 2021-07-11 DIAGNOSIS — Z86711 Personal history of pulmonary embolism: Secondary | ICD-10-CM | POA: Diagnosis not present

## 2021-07-11 DIAGNOSIS — Z23 Encounter for immunization: Secondary | ICD-10-CM | POA: Diagnosis not present

## 2021-07-11 DIAGNOSIS — Z8589 Personal history of malignant neoplasm of other organs and systems: Secondary | ICD-10-CM | POA: Diagnosis not present

## 2021-07-11 DIAGNOSIS — Z0000 Encounter for general adult medical examination without abnormal findings: Secondary | ICD-10-CM | POA: Diagnosis not present

## 2021-07-11 DIAGNOSIS — G4733 Obstructive sleep apnea (adult) (pediatric): Secondary | ICD-10-CM | POA: Diagnosis not present

## 2021-07-11 DIAGNOSIS — G2581 Restless legs syndrome: Secondary | ICD-10-CM | POA: Diagnosis not present

## 2021-07-11 DIAGNOSIS — E890 Postprocedural hypothyroidism: Secondary | ICD-10-CM | POA: Diagnosis not present

## 2021-07-11 DIAGNOSIS — E119 Type 2 diabetes mellitus without complications: Secondary | ICD-10-CM | POA: Diagnosis not present

## 2021-09-05 DIAGNOSIS — C099 Malignant neoplasm of tonsil, unspecified: Secondary | ICD-10-CM | POA: Diagnosis not present

## 2021-09-25 DIAGNOSIS — G4733 Obstructive sleep apnea (adult) (pediatric): Secondary | ICD-10-CM | POA: Diagnosis not present

## 2021-09-27 DIAGNOSIS — G4733 Obstructive sleep apnea (adult) (pediatric): Secondary | ICD-10-CM | POA: Diagnosis not present

## 2021-10-28 ENCOUNTER — Telehealth: Payer: Self-pay | Admitting: Neurology

## 2021-10-30 NOTE — Telephone Encounter (Signed)
Pt called in stating he is out of his gabapentin. He is hoping to get a refill today.

## 2021-10-30 NOTE — Telephone Encounter (Signed)
Refill sent.

## 2021-11-28 ENCOUNTER — Other Ambulatory Visit: Payer: Self-pay | Admitting: Neurology

## 2021-12-12 DIAGNOSIS — C099 Malignant neoplasm of tonsil, unspecified: Secondary | ICD-10-CM | POA: Diagnosis not present

## 2021-12-17 NOTE — Progress Notes (Unsigned)
NEUROLOGY FOLLOW UP OFFICE NOTE  Glenn Goodman [id number]  Assessment/Plan:   Restless Leg Syndrome  Gabapentin '600mg'$  at bedtime Follow up one year.    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   UPDATE: Current medication:  gabapentin '600mg'$  QHS   Doing well.  Restless leg is controlled.  HISTORY: He has been dealing with RLS for 10 years.  It affects either on leg or the other but usually not affected at the same time.  Over the course of the past several years, he has increased pramipexole from 0.'25mg'$  to '1mg'$ .  He has steadily had breakthrough symptoms at night and his PCP wouldn't increase the dose unless he was seen by a neurologist.  He takes the pramipexole at 7:45 PM and gets into bed at around 10PM.  Normally he can sleep through the night, but previously was having some breakthrough attacks.  He is unable to nap during the day because the symptoms affect him.  Over the past year, he has had some symptoms in the arms as well.  He has OSA and uses a CPAP   He is not aware of family history of RLS.   Past medications:  Ropinirole '1mg'$  (ineffective), pramipexole '1mg'$  QHS  PAST MEDICAL HISTORY: Past Medical History:  Diagnosis Date   Arthritis    Cancer ([place])    OSA on CPAP    Restless leg syndrome     MEDICATIONS: Current Outpatient Medications on File Prior to Visit  Medication Sig Dispense Refill   Acetaminophen (TYLENOL PO) Take by mouth.     apixaban (ELIQUIS) 5 MG TABS tablet '10mg'$  po bid for 7 days then '5mg'$  po bid , for 30days supply. 60 tablet 0   CLINPRO 5000 1.1 % PSTE Place 1 application onto teeth as directed.     ferrous sulfate 325 (65 FE) MG tablet Take 325 mg by mouth daily with breakfast. (Patient not taking: Reported on 01/25/2021)     gabapentin (NEURONTIN) 600 MG tablet TAKE 1  TABLET BY MOUTH EVERYDAY AT BEDTIME 30 tablet 0   levothyroxine (SYNTHROID) 50 MCG tablet Take 50 mcg by mouth every morning.     omeprazole (PRILOSEC) 20 MG capsule Take 1 capsule by mouth in the morning and at bedtime.     pramipexole (MIRAPEX) 1 MG tablet Take 1 mg by mouth at bedtime. (Patient not taking: Reported on 01/25/2021)     sodium fluoride (PREVIDENT 5000 PLUS) 1.1 % CREA dental cream Place 1 application onto teeth at bedtime. Apply to tooth brush. Brush teeth for 2 minutes. Spit out excess. DO NOT rinse afterwards. Repeat nightly. (Patient not taking: Reported on 05/18/2021)     Zinc 50 MG TABS Take 1 tablet by mouth daily.     Current Facility-Administered Medications on File Prior to Visit  Medication Dose Route Frequency Provider Last Rate Last Admin   heparin lock flush 100 unit/mL  500 Units Intravenous Once Ennever, Rudell Cobb, MD       sodium chloride flush (NS) 0.9 % injection 10 mL  10 mL Intravenous PRN Ennever, Rudell Cobb, MD        ALLERGIES: No Known Allergies  FAMILY HISTORY: Family History  Problem Relation Age of Onset   Hypertension Mother    Prostate cancer Father  Objective:  Blood pressure 109/72, pulse 77, height '5\' 8"'$  (1.727 m), weight 176 lb 12.8 oz (80.2 kg), SpO2 96 %. General: No acute distress.  Patient appears well-groomed.   Head:  Normocephalic/atraumatic Eyes:  Fundi examined but not visualized Neck: supple, no paraspinal tenderness, full range of motion Heart:  Regular rate and rhythm Neurological Exam: alert and oriented to person, place, and time.  Speech fluent and not dysarthric, language intact.  CN II-XII intact. Bulk and tone normal, muscle strength 5/5 throughout.  Sensation to light touch intact.  Deep tendon reflexes 2+ throughout, toes downgoing.  Finger to nose testing intact.  Gait normal, Romberg negative.   Metta Clines, DO  CC: [name], MD

## 2021-12-18 ENCOUNTER — Encounter: Payer: Self-pay | Admitting: Neurology

## 2021-12-18 ENCOUNTER — Ambulatory Visit: Payer: 59 | Admitting: Neurology

## 2021-12-18 ENCOUNTER — Telehealth: Payer: Self-pay

## 2021-12-18 VITALS — BP 109/72 | HR 77 | Ht 68.0 in | Wt 176.8 lb

## 2021-12-18 DIAGNOSIS — G2581 Restless legs syndrome: Secondary | ICD-10-CM | POA: Diagnosis not present

## 2021-12-18 MED ORDER — GABAPENTIN 600 MG PO TABS: ORAL_TABLET | ORAL | 3 refills | Status: DC

## 2021-12-18 NOTE — Telephone Encounter (Signed)
ERROR

## 2021-12-18 NOTE — Patient Instructions (Signed)
Refilled gabapentin '600mg'$  at bedtime Follow up one year

## 2021-12-25 DIAGNOSIS — G4733 Obstructive sleep apnea (adult) (pediatric): Secondary | ICD-10-CM | POA: Diagnosis not present

## 2022-01-15 DIAGNOSIS — M6281 Muscle weakness (generalized): Secondary | ICD-10-CM | POA: Diagnosis not present

## 2022-01-15 DIAGNOSIS — M545 Low back pain, unspecified: Secondary | ICD-10-CM | POA: Diagnosis not present

## 2022-01-15 DIAGNOSIS — M2569 Stiffness of other specified joint, not elsewhere classified: Secondary | ICD-10-CM | POA: Diagnosis not present

## 2022-01-24 DIAGNOSIS — M2569 Stiffness of other specified joint, not elsewhere classified: Secondary | ICD-10-CM | POA: Diagnosis not present

## 2022-01-24 DIAGNOSIS — M6281 Muscle weakness (generalized): Secondary | ICD-10-CM | POA: Diagnosis not present

## 2022-01-24 DIAGNOSIS — M545 Low back pain, unspecified: Secondary | ICD-10-CM | POA: Diagnosis not present

## 2022-01-25 ENCOUNTER — Encounter: Payer: Self-pay | Admitting: Adult Health

## 2022-01-25 ENCOUNTER — Inpatient Hospital Stay: Payer: 59 | Attending: Adult Health | Admitting: Adult Health

## 2022-01-25 ENCOUNTER — Inpatient Hospital Stay: Payer: 59

## 2022-01-25 ENCOUNTER — Other Ambulatory Visit: Payer: Self-pay

## 2022-01-25 VITALS — BP 124/83 | HR 82 | Temp 98.5°F | Resp 18 | Ht 68.0 in | Wt 177.6 lb

## 2022-01-25 DIAGNOSIS — C01 Malignant neoplasm of base of tongue: Secondary | ICD-10-CM | POA: Diagnosis present

## 2022-01-25 DIAGNOSIS — Z7901 Long term (current) use of anticoagulants: Secondary | ICD-10-CM | POA: Insufficient documentation

## 2022-01-25 DIAGNOSIS — Z86718 Personal history of other venous thrombosis and embolism: Secondary | ICD-10-CM | POA: Diagnosis not present

## 2022-01-25 DIAGNOSIS — C4442 Squamous cell carcinoma of skin of scalp and neck: Secondary | ICD-10-CM

## 2022-01-25 DIAGNOSIS — C779 Secondary and unspecified malignant neoplasm of lymph node, unspecified: Secondary | ICD-10-CM | POA: Insufficient documentation

## 2022-01-25 LAB — ANTITHROMBIN III: AntiThromb III Func: 109 % (ref 75–120)

## 2022-01-25 NOTE — Progress Notes (Signed)
CLINIC:  Survivorship  REASON FOR VISIT:  Routine follow-up for history of head & neck cancer.  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 EBV ISH negative.    04/22/2018 Pathology Results   (Accession: [id number]) Tonsil, biopsy, left, base of tongue - INVASIVE KERATINIZING SQUAMOUS CELL CARCINOMA.   04/29/2018 Imaging   PET:  IMPRESSION: 1. Left palatine tonsil/adjacent pharyngeal mucosal space lesion is asymmetric, measures about 1.3 cm in long axis, and has a maximum SUV of 8.2, compatible with malignancy. There is associated hypermetabolic left level IIa adenopathy as shown on prior exams. 2. Symmetric accentuated activity in the lymphoid tissue at the tongue base, maximum SUV 5.3, quite likely physiologic given the symmetry.  3. No findings of metastatic disease Pred to the chest, abdomen/pelvis, or skeleton.   05/26/2018 Surgery   Radical tonsillectomy   05/26/2018 Pathology Results   PROCEDURE:  Radical tonsillectomy TUMOR SITE:  Glossotonsillar sulcus TUMOR LATERALITY:  LEFT TUMOR FOCALITY:  Unifocal TUMOR SIZE:  GREATEST DIMENSION:  1.6 cm HISTOLOGIC TYPE:  HPV-mediated squamous cell carcinoma MARGINS:  Involved by invasive tumor (focal) Specify location of closest margin:  Tongue base (soft tissue) Note:  Although an additional tongue base mucosal margin was submitted, location of tumor extension to margin appears 1 cm from the mucosal surface. LYMPHOVASCULAR INVASION:  Not identified PERINEURAL INVASION:  Present REGIONAL LYMPH NODES:    NUMBER OF LYMPH NODES INVOLVED:  1   NUMBER OF LYMPH NODES EXAMINED:  6   LATERALITY OF LYMPH NODES INVOLVED:  Ipsilateral   SIZE OF LARGEST METASTATIC DEPOSIT:  6.8 cm   EXTRANODAL EXTENSION:  Present     Distance from lymph node capsule:  6 millimeters PATHOLOGIC STAGE CLASSIFICATION (pTNM, AJCC 8TH Ed):  pT1 pN1 (HPV-mediated oropharynx tumor)   pT1:  Tumor 2 cm or smaller in greatest dimension   pN1:  Metastasis in 4 or fewer lymph nodes ANCILLARY STUDIES:   p16 expression by immunohistochemistry:  Positive   HPV by PCR: pending   05/26/2018 Cancer Staging   Staging form: Pharynx - HPV-Mediated Oropharynx, AJCC 8th Edition - Pathologic stage from 05/26/2018: Stage I (pT1, pN1, cM0, p16+) - Signed by Tish Men, MD on 06/18/2018   07/10/2018 - 07/10/2018 Chemotherapy   The patient had dexamethasone (DECADRON) 4 MG tablet, 1 of 1 cycle, Start date: 06/18/2018, End date: 06/23/2018 palonosetron (ALOXI) injection 0.25 mg, 0.25 mg, Intravenous,  Once, 0 of 3 cycles CISplatin (PLATINOL) 198 mg in sodium chloride 0.9 % 500 mL chemo infusion, 100 mg/m2 = 198 mg, Intravenous,  Once, 0 of 3 cycles fosaprepitant (EMEND) 150 mg, dexamethasone (DECADRON) 12 mg in sodium chloride 0.9 % 145 mL IVPB, , Intravenous,  Once, 0 of 3 cycles  for chemotherapy treatment.    07/10/2018 - 08/14/2018 Chemotherapy   The patient had dexamethasone (DECADRON) 4 MG tablet, 1 of 1 cycle, Start date: 06/23/2018, End date: 08/27/2018 palonosetron (ALOXI) injection 0.25 mg, 0.25 mg, Intravenous,  Once, 6 of 6 cycles Administration: 0.25 mg (07/10/2018), 0.25 mg (07/18/2018), 0.25 mg (07/24/2018), 0.25 mg (07/31/2018), 0.25 mg (08/07/2018),  0.25 mg (08/14/2018) CISplatin (PLATINOL) 79 mg in sodium chloride 0.9 % 250 mL chemo infusion, 40 mg/m2 = 79 mg, Intravenous,  Once, 6 of 6 cycles Administration: 79 mg (07/10/2018), 79 mg (07/18/2018), 79 mg (07/24/2018), 79 mg (07/31/2018), 79 mg (08/07/2018), 79 mg (08/14/2018) fosaprepitant (EMEND) 150  mg, dexamethasone (DECADRON) 12 mg in sodium chloride 0.9 % 145 mL IVPB, , Intravenous,  Once, 6 of 6 cycles Administration:  (07/10/2018),  (07/18/2018),  (07/24/2018),  (07/31/2018),  (08/07/2018),  (08/14/2018)  for chemotherapy treatment.    10/20/2018 Imaging   CT neck w/ contrast: IMPRESSION: Interval left tonsillectomy and neck dissection. No evidence of residual mass in the left tonsillar region. Postsurgical distortion of the left neck, without evidence of residual or recurrent tumor.   I would categorize this as NI RADS 1 based on the CT, but this could be altered based on follow-up PET.   10/20/2018 Imaging   CT chest w/ contrast: IMPRESSION: No evidence of metastatic disease in the chest.   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  He has been recovering from his treatment well.  He has no new pain, swallowing issues, and has his teeth cleaned regularly.  His weight has stable.  He sees his ENT once a year and his dentist regularly.  He has regular f/u with his PCP.    -Last TSH: tested with [facility]    ADDITIONAL REVIEW OF SYSTEMS:  Review of Systems  Constitutional:  Negative for appetite change, chills, fatigue, fever and unexpected weight change.  HENT:   Negative for hearing loss, lump/mass and trouble swallowing.   Eyes:  Negative for eye problems and icterus.  Respiratory:  Negative for chest tightness, cough and shortness of breath.   Cardiovascular:  Negative for chest pain, leg swelling and  palpitations.  Gastrointestinal:  Negative for abdominal distention, abdominal pain, constipation, diarrhea, nausea and vomiting.  Endocrine: Negative for hot flashes.  Genitourinary:  Negative for difficulty urinating.   Musculoskeletal:  Negative for arthralgias.  Skin:  Negative for itching and rash.  Neurological:  Negative for dizziness, extremity weakness, headaches and numbness.  Hematological:  Negative for adenopathy. Does not bruise/bleed easily.  Psychiatric/Behavioral:  Negative for depression. The patient is not nervous/anxious.        CURRENT MEDICATIONS:  Current Outpatient Medications on File Prior to Visit  Medication Sig Dispense Refill   Acetaminophen (TYLENOL PO) Take by mouth.     apixaban (ELIQUIS) 5 MG TABS tablet '10mg'$  po bid for 7 days then '5mg'$  po bid , for 30days supply. (Patient taking differently: Take 5 mg by mouth daily.) 60 tablet 0   CLINPRO 5000 1.1 % PSTE Place 1 application onto teeth as directed.     gabapentin (NEURONTIN) 600 MG tablet TAKE 1 TABLET BY MOUTH EVERYDAY AT BEDTIME 90 tablet 3   levothyroxine (SYNTHROID) 75 MCG tablet Take 75 mcg by mouth every morning.     omeprazole (PRILOSEC) 20 MG capsule Take 1 capsule by mouth daily.  tamsulosin (FLOMAX) 0.4 MG CAPS capsule Take 0.4 mg by mouth daily.     Current Facility-Administered Medications on File Prior to Visit  Medication Dose Route Frequency Provider Last Rate Last Admin   heparin lock flush 100 unit/mL  500 Units Intravenous Once Ennever, Rudell Cobb, MD       sodium chloride flush (NS) 0.9 % injection 10 mL  10 mL Intravenous PRN Volanda Napoleon, MD        ALLERGIES:  No Known Allergies   PHYSICAL EXAM:  Vitals:   01/25/22 0940  BP: 124/83  Pulse: 82  Resp: 18  Temp: 98.5 F (36.9 C)  SpO2: 98%   Filed Weights   01/25/22 0940  Weight: 177 lb 9.6 oz (80.6 kg)   General: Well-nourished, well-appearing male in no acute distress.  Accompanied/Unaccompanied today.  HEENT:  Head is atraumatic and normocephalic.  Pupils equal and reactive to light. Conjunctivae clear without exudate.  Sclerae anicteric. Oral mucosa is pink and moist without lesions.  Tongue pink, moist, and midline. Oropharynx is pink and moist, without lesions. Lymph: No preauricular, postauricular, cervical, supraclavicular, or infraclavicular lymphadenopathy noted on palpation.   Neck: No palpable masses. Skin on neck is normal.  Cardiovascular: Normal rate and rhythm. Respiratory: Clear to auscultation bilaterally. Chest expansion symmetric without accessory muscle use; breathing non-labored.  GI: Abdomen soft and round. Non-tender, non-distended. Bowel sounds normoactive.  GU: Deferred.   Neuro: No focal deficits. Steady gait.   Psych: Normal mood and affect for situation. Extremities: No edema.  Skin: Warm and dry.    LABORATORY DATA:  None at this visit.  DIAGNOSTIC IMAGING:  None at this visit.    ASSESSMENT & PLAN:  Mr. Frazer is a pleasant 61 y.o. 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   1. Squamous Cell Carcinoma at the base of tongue:  Mr. Puertas is clinically without evidence of disease or recurrence on physical exam today.   He is eating and drinking well and has no difficulty swallowing.    2.  At risk for hypothyroidism: The thyroid gland is often affected after treatment for head & neck cancer.  He has a TSH drawn   3. At risk for tooth decay/dental concerns: After treatment with radiation for head & neck cancers, patients often experience xerostomia which increases their risk of dental caries.    4. Health maintenance and wellness promotion: Cancer patients who consume a diet rich in fruits and vegetables have better overall health and decreased risk of cancer recurrence. Mr. Reali was  encouraged to consume 5-7 servings of fruits and vegetables per day, as tolerated. Mr. Wishart was also encouraged to engage in moderate to vigorous exercise for 30 minutes per day most days of the week.   5. History of DVT:  He would like to be able to stop taking his Eliquis.  Dr. Chryl Heck would like to ensure a hypercoagulable panel is negative first which I ordered to be completed today after his visit.  We will call him with the results.    Dispo:  Glenn Goodman will return in one year for f/u of his cancer.        A total of 40 minutes was spent in the face-to-face care of this patient, with greater than 50% of that time spent in counseling and care-coordination.    Charlestine Massed,  NP [place] [phone]

## 2022-01-26 LAB — BETA-2-GLYCOPROTEIN I ABS, IGG/M/A
Beta-2 Glyco I IgG: <9 GPI IgG units (ref 0–20)
Beta-2-Glycoprotein I IgA: <9 GPI IgA units (ref 0–25)
Beta-2-Glycoprotein I IgM: <9 GPI IgM units (ref 0–32)

## 2022-01-26 LAB — HOMOCYSTEINE: Homocysteine: 6.5 umol/L (ref 0.0–17.2)

## 2022-01-26 LAB — PROTEIN S, TOTAL: Protein S Ag, Total: 80 % (ref 60–150)

## 2022-01-26 LAB — PROTEIN S ACTIVITY: Protein S Activity: 107 % (ref 63–140)

## 2022-01-26 LAB — PROTEIN C ACTIVITY: Protein C Activity: 116 % (ref 73–180)

## 2022-01-27 LAB — LUPUS ANTICOAGULANT PANEL
DRVVT: 53.2 s — ABNORMAL HIGH (ref 0.0–47.0)
PTT Lupus Anticoagulant: 33 s (ref 0.0–43.5)

## 2022-01-27 LAB — DRVVT MIX: dRVVT Mix: 44.9 s — ABNORMAL HIGH (ref 0.0–40.4)

## 2022-01-27 LAB — DRVVT CONFIRM: dRVVT Confirm: 1 ratio (ref 0.8–1.2)

## 2022-01-28 LAB — CARDIOLIPIN ANTIBODIES, IGG, IGM, IGA
Anticardiolipin IgA: <9 APL U/mL (ref 0–11)
Anticardiolipin IgG: <9 GPL U/mL (ref 0–14)
Anticardiolipin IgM: <9 MPL U/mL (ref 0–12)

## 2022-01-28 LAB — PROTEIN C, TOTAL: Protein C, Total: 85 % (ref 60–150)

## 2022-01-30 LAB — FACTOR 5 LEIDEN

## 2022-01-30 LAB — PROTHROMBIN GENE MUTATION

## 2022-02-06 DIAGNOSIS — M545 Low back pain, unspecified: Secondary | ICD-10-CM | POA: Diagnosis not present

## 2022-02-06 DIAGNOSIS — M6281 Muscle weakness (generalized): Secondary | ICD-10-CM | POA: Diagnosis not present

## 2022-02-06 DIAGNOSIS — M2569 Stiffness of other specified joint, not elsewhere classified: Secondary | ICD-10-CM | POA: Diagnosis not present

## 2022-02-08 ENCOUNTER — Inpatient Hospital Stay (HOSPITAL_BASED_OUTPATIENT_CLINIC_OR_DEPARTMENT_OTHER): Payer: 59 | Admitting: Hematology and Oncology

## 2022-02-08 ENCOUNTER — Encounter: Payer: Self-pay | Admitting: Hematology and Oncology

## 2022-02-08 DIAGNOSIS — C4442 Squamous cell carcinoma of skin of scalp and neck: Secondary | ICD-10-CM

## 2022-02-08 DIAGNOSIS — Z86718 Personal history of other venous thrombosis and embolism: Secondary | ICD-10-CM

## 2022-02-08 NOTE — Progress Notes (Signed)
[facility] NOTE  Patient Care Team: [name], MD as PCP - General (Family Medicine) Melissa Montane, MD as Consulting Physician (Otolaryngology) Eppie Gibson, MD as Attending Physician (Radiation Oncology) Sharen Counter, CCC-SLP as Speech Language Pathologist (Speech Pathology) Karie Mainland, RD as Dietitian (Nutrition) Wynelle Beckmann, Melodie Bouillon, PT as Physical Therapist (Physical Therapy) Kennith Center, LCSW as Social Worker Francina Ames, MD as Referring Physician (Otolaryngology) Pieter Partridge, DO as Consulting Physician (Neurology) Benay Pike, MD as Consulting Physician (Hematology and Oncology)  CHIEF COMPLAINTS/PURPOSE OF CONSULTATION:  SCC left tonsil, surveillance, following up with Oncology  ASSESSMENT & PLAN:   This is a very pleasant 61 year old male patient with 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 Talitha Givens who is here for a telephone follow up. He continues to do well. Last visit with Dr Nicolette Bang July 2023, no malignancy.  His hypothyroidism is currently managed by his primary care physician  He had pulmonary embolism back in 2020 when he was being treated for his left tonsil cancer.  Since then he stayed on anticoagulation.  He was hoping to discontinue anticoagulation.  He denies any other prior episodes of DVT/PE.  He does report mild blood clot in his mom after hip fracture.  He had hypercoagulable work-up recently, no definitive hypercoagulable disorders noted.  Since this was his only episode of PE and since he has no clear family history of unprovoked DVT/PE, we have discussed about discontinuing anticoagulation and moving forward with aspirin 81 mg p.o. daily.  He understands the symptoms and signs of blood clot and need to go to the nearest hospital with any concerning  symptoms.  He also understands risk factors.  He was clearly advised to avoid any form of testosterone supplementation in the future.  Return to clinic in 1 year  HISTORY OF PRESENTING ILLNESS:  Glenn Goodman 61 y.o. male is here because of SCC left tonsil.  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 EBV ISH negative.    04/22/2018 Pathology Results   (Accession: [id number]) Tonsil, biopsy, left, base of tongue - INVASIVE KERATINIZING SQUAMOUS CELL CARCINOMA.   04/29/2018 Imaging   PET:  IMPRESSION: 1. Left palatine tonsil/adjacent pharyngeal mucosal space lesion is asymmetric, measures about 1.3 cm in long axis, and has a maximum SUV of 8.2, compatible with malignancy. There is associated hypermetabolic left level IIa adenopathy as shown on prior exams. 2. Symmetric accentuated activity in the lymphoid tissue at the tongue base, maximum SUV 5.3, quite likely physiologic given the symmetry.  3. No findings of metastatic disease Pred to the chest, abdomen/pelvis, or skeleton.   05/26/2018 Surgery   Radical tonsillectomy   05/26/2018 Pathology Results   PROCEDURE:  Radical tonsillectomy TUMOR SITE:  Glossotonsillar sulcus TUMOR LATERALITY:  LEFT TUMOR FOCALITY:  Unifocal TUMOR SIZE:  GREATEST DIMENSION:  1.6 cm HISTOLOGIC TYPE:  HPV-mediated squamous cell carcinoma MARGINS:  Involved by invasive tumor (focal) Specify location of closest margin:  Tongue base (  soft tissue) Note:  Although an additional tongue base mucosal margin was submitted, location of tumor extension to margin  appears 1 cm from the mucosal surface. LYMPHOVASCULAR INVASION:  Not identified PERINEURAL INVASION:  Present REGIONAL LYMPH NODES:   NUMBER OF LYMPH NODES INVOLVED:  1   NUMBER OF LYMPH NODES EXAMINED:  6   LATERALITY OF LYMPH NODES INVOLVED:  Ipsilateral   SIZE OF LARGEST METASTATIC DEPOSIT:  6.8 cm   EXTRANODAL EXTENSION:  Present     Distance from lymph node capsule:  6 millimeters PATHOLOGIC STAGE CLASSIFICATION (pTNM, AJCC 8TH Ed):  pT1 pN1 (HPV-mediated oropharynx tumor)   pT1:  Tumor 2 cm or smaller in greatest dimension   pN1:  Metastasis in 4 or fewer lymph nodes ANCILLARY STUDIES:   p16 expression by immunohistochemistry:  Positive   HPV by PCR: pending   05/26/2018 Cancer Staging   Staging form: Pharynx - HPV-Mediated Oropharynx, AJCC 8th Edition - Pathologic stage from 05/26/2018: Stage I (pT1, pN1, cM0, p16+) - Signed by Tish Men, MD on 06/18/2018   07/10/2018 - 07/10/2018 Chemotherapy   The patient had dexamethasone (DECADRON) 4 MG tablet, 1 of 1 cycle, Start date: 06/18/2018, End date: 06/23/2018 palonosetron (ALOXI) injection 0.25 mg, 0.25 mg, Intravenous,  Once, 0 of 3 cycles CISplatin (PLATINOL) 198 mg in sodium chloride 0.9 % 500 mL chemo infusion, 100 mg/m2 = 198 mg, Intravenous,  Once, 0 of 3 cycles fosaprepitant (EMEND) 150 mg, dexamethasone (DECADRON) 12 mg in sodium chloride 0.9 % 145 mL IVPB, , Intravenous,  Once, 0 of 3 cycles  for chemotherapy treatment.    07/10/2018 - 08/14/2018 Chemotherapy   The patient had dexamethasone (DECADRON) 4 MG tablet, 1 of 1 cycle, Start date: 06/23/2018, End date: 08/27/2018 palonosetron (ALOXI) injection 0.25 mg, 0.25 mg, Intravenous,  Once, 6 of 6 cycles Administration: 0.25 mg (07/10/2018), 0.25 mg (07/18/2018), 0.25 mg (07/24/2018), 0.25 mg (07/31/2018), 0.25 mg (08/07/2018), 0.25 mg (08/14/2018) CISplatin (PLATINOL) 79 mg in sodium chloride 0.9 % 250 mL chemo infusion, 40 mg/m2 = 79 mg, Intravenous,  Once, 6 of 6 cycles Administration: 79  mg (07/10/2018), 79 mg (07/18/2018), 79 mg (07/24/2018), 79 mg (07/31/2018), 79 mg (08/07/2018), 79 mg (08/14/2018) fosaprepitant (EMEND) 150 mg, dexamethasone (DECADRON) 12 mg in sodium chloride 0.9 % 145 mL IVPB, , Intravenous,  Once, 6 of 6 cycles Administration:  (07/10/2018),  (07/18/2018),  (07/24/2018),  (07/31/2018),  (08/07/2018),  (08/14/2018)  for chemotherapy treatment.    10/20/2018 Imaging   CT neck w/ contrast: IMPRESSION: Interval left tonsillectomy and neck dissection. No evidence of residual mass in the left tonsillar region. Postsurgical distortion of the left neck, without evidence of residual or recurrent tumor.   I would categorize this as NI RADS 1 based on the CT, but this could be altered based on follow-up PET.   10/20/2018 Imaging   CT chest w/ contrast: IMPRESSION: No evidence of metastatic disease in the chest.   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 Vertell Novak and Dr. Isidore Moos who presents to the clinic for follow-up. Last visit with Dr Nicolette Bang in July 2023, no evidence of malignancy. He is now on 75 mcg of levothyroxine, following up with PCP for hypothyrodism He is doing very well overall, no complaints at all.  He had questions if he can discontinue anticoagulation and hence the telephone visit after doing hypercoagulable work-up   MEDICAL HISTORY:  Past Medical History:  Diagnosis Date   Arthritis    Cancer  ([place])    OSA on CPAP    Restless leg syndrome     SURGICAL HISTORY: Past Surgical History:  Procedure Laterality Date   DIRECT LARYNGOSCOPY Left 04/22/2018   Dr. Janace Hoard   FACIAL FRACTURE SURGERY Left 1986   IR ANGIOGRAM PULMONARY BILATERAL SELECTIVE  08/22/2018   IR ANGIOGRAM SELECTIVE EACH ADDITIONAL VESSEL  08/22/2018   IR ANGIOGRAM SELECTIVE EACH ADDITIONAL VESSEL  08/22/2018   IR GASTROSTOMY TUBE MOD SED  06/30/2018   IR GASTROSTOMY TUBE REMOVAL  01/06/2019   IR IMAGING GUIDED PORT INSERTION  06/30/2018   IR INFUSION THROMBOL ARTERIAL INITIAL (MS)  08/22/2018   IR INFUSION THROMBOL ARTERIAL INITIAL (MS)  08/22/2018   IR REMOVAL TUN ACCESS W/ PORT W/O FL MOD SED  11/28/2018   IR THROMB F/U EVAL ART/VEN FINAL DAY (MS)  08/23/2018   IR US GUIDE VASC ACCESS RIGHT  08/22/2018   MODIFIED RADICAL NECK DISSECTION Left 05/26/2018   Dr. Nicolette Bang, [facility]   transoral robotic surgery  05/26/2018   TORS, Dr. Nicolette Bang [facility]    SOCIAL HISTORY: Social History   Socioeconomic History   Marital status: Married    Spouse name: Not on file   Number of children: 2   Years of education: Not on file   Highest education level: Not on file  Occupational History   Not on file  Tobacco Use   Smoking status: Never   Smokeless tobacco: Never  Vaping Use   Vaping Use: Never used  Substance and Sexual Activity   Alcohol use: Not Currently   Drug use: Never   Sexual activity: Not on file  Other Topics Concern   Not on file  Social History Narrative   Right handed   Lives in a two story home   Drinks coffee and tea    Social Determinants of Health   Financial Resource Strain: Not on file  Food Insecurity: Not on file  Transportation Needs: No Transportation Needs (05/05/2018)   PRAPARE - Transportation    Lack of Transportation (Medical): No    Lack of Transportation (Non-Medical): No  Physical Activity: Not on file  Stress: Not on file  Social Connections: Not on file  Intimate Partner Violence: Not  At Risk (06/10/2018)   Humiliation, Afraid, Rape, and Kick questionnaire    Fear of Current or Ex-Partner: No    Emotionally Abused: No    Physically Abused: No    Sexually Abused: No    FAMILY HISTORY: Family History  Problem Relation Age of Onset   Hypertension Mother    Prostate cancer Father     ALLERGIES:  has No Known Allergies.  MEDICATIONS:  Current Outpatient Medications  Medication Sig Dispense Refill   Acetaminophen (TYLENOL PO) Take by mouth.     apixaban (ELIQUIS) 5 MG TABS tablet '10mg'$  po bid for 7 days then '5mg'$  po bid , for 30days supply. (Patient taking differently: Take 5 mg by mouth  daily.) 60 tablet 0   CLINPRO 5000 1.1 % PSTE Place 1 application onto teeth as directed.     gabapentin (NEURONTIN) 600 MG tablet TAKE 1 TABLET BY MOUTH EVERYDAY AT BEDTIME 90 tablet 3   levothyroxine (SYNTHROID) 75 MCG tablet Take 75 mcg by mouth every morning.     omeprazole (PRILOSEC) 20 MG capsule Take 1 capsule by mouth daily.     tamsulosin (FLOMAX) 0.4 MG CAPS capsule Take 0.4 mg by mouth daily.     No current facility-administered medications for this visit.   Facility-Administered Medications Ordered in Other Visits  Medication Dose Route Frequency Provider Last Rate Last Admin   heparin lock flush 100 unit/mL  500 Units Intravenous Once Ennever, Rudell Cobb, MD       sodium chloride flush (NS) 0.9 % injection 10 mL  10 mL Intravenous PRN Ennever, Rudell Cobb, MD        PHYSICAL EXAMINATION: ECOG PERFORMANCE STATUS: 0 - Asymptomatic  There were no vitals filed for this visit.  There were no vitals filed for this visit.  PE not done, telephone visit   LABORATORY DATA:  I have reviewed the data as listed Lab Results  Component Value Date   WBC 6.4 03/07/2020   HGB 16.1 03/07/2020   HCT 47.6 03/07/2020   MCV 92.8 03/07/2020   PLT 236 03/07/2020     Chemistry      Component Value Date/Time   NA 143 03/07/2020 1009   K 4.0 03/07/2020 1009   CL 106 03/07/2020 1009    CO2 27 03/07/2020 1009   BUN 17 03/07/2020 1009   CREATININE 1.07 03/07/2020 1009      Component Value Date/Time   CALCIUM 9.3 03/07/2020 1009   ALKPHOS 83 03/07/2020 1009   AST 15 03/07/2020 1009   ALT 21 03/07/2020 1009   BILITOT 0.3 03/07/2020 1009       RADIOGRAPHIC STUDIES: I have personally reviewed the radiological images as listed and agreed with the findings in the report. No results found.  All questions were answered. The patient knows to call the clinic with any problems, questions or concerns. I spent 15  minutes in the care of this patient including H and P, review of records, counseling and coordination of care.  I have reviewed all pertinent records, discussed about surveillance recommendations and follow-up recommendations.  I connected with  Glenn Goodman on 02/08/22 by a telephone application and verified that I am speaking with the correct person using two identifiers.   I discussed the limitations of evaluation and management by telemedicine. The patient expressed understanding and agreed to proceed.      Benay Pike, MD 02/08/2022 1:45 PM

## 2022-02-13 DIAGNOSIS — M2569 Stiffness of other specified joint, not elsewhere classified: Secondary | ICD-10-CM | POA: Diagnosis not present

## 2022-02-13 DIAGNOSIS — M545 Low back pain, unspecified: Secondary | ICD-10-CM | POA: Diagnosis not present

## 2022-02-13 DIAGNOSIS — M6281 Muscle weakness (generalized): Secondary | ICD-10-CM | POA: Diagnosis not present

## 2022-03-03 DIAGNOSIS — M545 Low back pain, unspecified: Secondary | ICD-10-CM | POA: Diagnosis not present

## 2022-03-03 DIAGNOSIS — M2569 Stiffness of other specified joint, not elsewhere classified: Secondary | ICD-10-CM | POA: Diagnosis not present

## 2022-03-03 DIAGNOSIS — M6281 Muscle weakness (generalized): Secondary | ICD-10-CM | POA: Diagnosis not present

## 2022-03-26 DIAGNOSIS — G4733 Obstructive sleep apnea (adult) (pediatric): Secondary | ICD-10-CM | POA: Diagnosis not present

## 2022-06-12 DIAGNOSIS — C099 Malignant neoplasm of tonsil, unspecified: Secondary | ICD-10-CM | POA: Diagnosis not present

## 2022-06-25 DIAGNOSIS — G4733 Obstructive sleep apnea (adult) (pediatric): Secondary | ICD-10-CM | POA: Diagnosis not present

## 2022-09-24 DIAGNOSIS — G4733 Obstructive sleep apnea (adult) (pediatric): Secondary | ICD-10-CM | POA: Diagnosis not present

## 2022-10-03 DIAGNOSIS — G4733 Obstructive sleep apnea (adult) (pediatric): Secondary | ICD-10-CM | POA: Diagnosis not present

## 2022-12-05 NOTE — Progress Notes (Signed)
NEUROLOGY FOLLOW UP OFFICE NOTE  Glenn Goodman [id number]  Assessment/Plan:   Restless leg syndrome  Gabaentin 600mg  at bedtime  Follow up one year.     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   UPDATE: Current medication:  gabapentin 600mg  QHS   Doing well.  Restless leg is controlled. Once or twice a week, he may experience symptoms shortly before bed.    HISTORY: He has been dealing with RLS for 10 years.  It affects either on leg or the other but usually not affected at the same time.  Over the course of the past several years, he has increased pramipexole from 0.25mg  to 1mg .  He has steadily had breakthrough symptoms at night and his PCP wouldn't increase the dose unless he was seen by a neurologist.  He takes the pramipexole at 7:45 PM and gets into bed at around 10PM.  Normally he can sleep through the night, but previously was having some breakthrough attacks.  He is unable to nap during the day because the symptoms affect him.  Over the past year, he has had some symptoms in the arms as well.  He has OSA and uses a CPAP   He is not aware of family history of RLS.   Past medications:  Ropinirole 1mg  (ineffective), pramipexole 1mg  QHS  PAST MEDICAL HISTORY: Past Medical History:  Diagnosis Date   Arthritis    Cancer (HCC)    OSA on CPAP    Restless leg syndrome     MEDICATIONS: Current Outpatient Medications on File Prior to Visit  Medication Sig Dispense Refill   Acetaminophen (TYLENOL PO) Take by mouth.     apixaban (ELIQUIS) 5 MG TABS tablet 10mg  po bid for 7 days then 5mg  po bid , for 30days supply. (Patient taking differently: Take 5 mg by mouth daily.) 60 tablet 0   CLINPRO 5000 1.1 % PSTE Place 1 application onto teeth as directed.     gabapentin (NEURONTIN) 600 MG tablet TAKE 1 TABLET  BY MOUTH EVERYDAY AT BEDTIME 90 tablet 3   levothyroxine (SYNTHROID) 75 MCG tablet Take 75 mcg by mouth every morning.     omeprazole (PRILOSEC) 20 MG capsule Take 1 capsule by mouth daily.     tamsulosin (FLOMAX) 0.4 MG CAPS capsule Take 0.4 mg by mouth daily.     Current Facility-Administered Medications on File Prior to Visit  Medication Dose Route Frequency Provider Last Rate Last Admin   heparin lock flush 100 unit/mL  500 Units Intravenous Once Ennever, Rose Phi, MD       sodium chloride flush (NS) 0.9 % injection 10 mL  10 mL Intravenous PRN Ennever, Rose Phi, MD        ALLERGIES: No Known Allergies  FAMILY HISTORY: Family History  Problem Relation Age of Onset   Hypertension Mother    Prostate cancer Father       Objective:  Blood pressure 110/70, pulse 70, height 5\' 8"  (1.727 m), weight 176 lb (79.8 kg), SpO2 98%. General: No acute distress.  Patient appears well-groomed.   Head:  Normocephalic/atraumatic Eyes:  Fundi examined but not visualized Neck: supple, no paraspinal tenderness, full range of motion Heart:  Regular rate and rhythm Neurological Exam: alert and oriented.  Speech fluent and not dysarthric, language intact.  CN II-XII  intact. Bulk and tone normal, muscle strength 5/5 throughout.  Sensation to light touch intact.  Deep tendon reflexes 2+ throughout.  Finger to nose testing intact.  Gait normal, Romberg negative.   Shon Millet, DO  CC: Blair Heys, MD

## 2022-12-10 ENCOUNTER — Encounter: Payer: Self-pay | Admitting: Neurology

## 2022-12-10 ENCOUNTER — Ambulatory Visit (INDEPENDENT_AMBULATORY_CARE_PROVIDER_SITE_OTHER): Payer: 59 | Admitting: Neurology

## 2022-12-10 VITALS — BP 110/70 | HR 70 | Ht 68.0 in | Wt 176.0 lb

## 2022-12-10 DIAGNOSIS — G2581 Restless legs syndrome: Secondary | ICD-10-CM | POA: Diagnosis not present

## 2022-12-10 MED ORDER — GABAPENTIN 600 MG PO TABS: ORAL_TABLET | ORAL | 11 refills | Status: DC

## 2022-12-10 NOTE — Patient Instructions (Signed)
Gabapentin 600 mg at bedtime

## 2022-12-26 DIAGNOSIS — C099 Malignant neoplasm of tonsil, unspecified: Secondary | ICD-10-CM | POA: Diagnosis not present

## 2023-01-10 DIAGNOSIS — N6325 Unspecified lump in the left breast, overlapping quadrants: Secondary | ICD-10-CM | POA: Diagnosis not present

## 2023-01-28 ENCOUNTER — Inpatient Hospital Stay: Payer: 59 | Attending: Hematology and Oncology | Admitting: Hematology and Oncology

## 2023-01-28 ENCOUNTER — Other Ambulatory Visit: Payer: 59

## 2023-01-28 VITALS — BP 128/82 | HR 86 | Temp 97.7°F | Resp 18 | Ht 68.0 in | Wt 177.9 lb

## 2023-01-28 DIAGNOSIS — E039 Hypothyroidism, unspecified: Secondary | ICD-10-CM | POA: Insufficient documentation

## 2023-01-28 DIAGNOSIS — Z8042 Family history of malignant neoplasm of prostate: Secondary | ICD-10-CM | POA: Diagnosis not present

## 2023-01-28 DIAGNOSIS — Z923 Personal history of irradiation: Secondary | ICD-10-CM | POA: Diagnosis not present

## 2023-01-28 DIAGNOSIS — C4442 Squamous cell carcinoma of skin of scalp and neck: Secondary | ICD-10-CM

## 2023-01-28 DIAGNOSIS — Z85818 Personal history of malignant neoplasm of other sites of lip, oral cavity, and pharynx: Secondary | ICD-10-CM | POA: Diagnosis not present

## 2023-01-28 DIAGNOSIS — Z9221 Personal history of antineoplastic chemotherapy: Secondary | ICD-10-CM | POA: Insufficient documentation

## 2023-01-28 DIAGNOSIS — Z79890 Hormone replacement therapy (postmenopausal): Secondary | ICD-10-CM | POA: Insufficient documentation

## 2023-01-28 DIAGNOSIS — Z86718 Personal history of other venous thrombosis and embolism: Secondary | ICD-10-CM | POA: Diagnosis not present

## 2023-01-28 NOTE — Progress Notes (Signed)
[facility] CONSULT NOTE  Patient Care Team: Blair Heys, MD as PCP - General (Family Medicine) Suzanna Obey, MD as Consulting Physician (Otolaryngology) Lonie Peak, MD as Attending Physician (Radiation Oncology) Barron Alvine, CCC-SLP as Speech Language Pathologist (Speech Pathology) Anabel Bene, RD as Dietitian (Nutrition) Jeanella Craze, Remi Deter, PT as Physical Therapist (Physical Therapy) Ernestene Mention, LCSW as Social Worker Corey Skains, MD as Referring Physician (Otolaryngology) Drema Dallas, DO as Consulting Physician (Neurology) Rachel Moulds, MD as Consulting Physician (Hematology and Oncology)  CHIEF COMPLAINTS/PURPOSE OF CONSULTATION:  SCC left tonsil, surveillance, following up with Oncology  ASSESSMENT & PLAN:   This is a very pleasant 62 year old male patient with 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 Joanna Hews who is here for a telephone follow up. He continues to do well. Last visit with Dr Hezzie Bump August 2024, no malignancy.  His hypothyroidism is currently managed by his PCP, no change in levothyroxine dose.  Since his last visit here, he denies any new health complaints.  He has been feeling well.  No concerns for recurrence of DVT/PE.  No concerns on physical exam.  At this time he is almost 4-1/2 years out from the time of diagnosis.  He can return to clinic in 1 year and if he is disease-free at that point, he can return to clinic as needed.  HISTORY OF PRESENTING ILLNESS:  Glenn Goodman 62 y.o. male is here because of SCC left tonsil.  Oncology History  Squamous cell carcinoma of left tonsil (HCC)  03/20/2018 Procedure   FNA of the left cervical LN in office   03/20/2018 Pathology Results   Non-diagnostic   04/07/2018 Procedure   US-guided left cervical LN  biopsy/FNA    04/07/2018 Pathology Results   (Accession: [id number]) Lymph node, needle/core biopsy, left cervical - METASTATIC SQUAMOUS CELL CARCINOMA INVOLVING A LYMPH NODE (1/1) - SEE COMMENT  p16 IHC positive. EBV ISH negative.    04/22/2018 Pathology Results   (Accession: [id number]) Tonsil, biopsy, left, base of tongue - INVASIVE KERATINIZING SQUAMOUS CELL CARCINOMA.   04/29/2018 Imaging   PET:  IMPRESSION: 1. Left palatine tonsil/adjacent pharyngeal mucosal space lesion is asymmetric, measures about 1.3 cm in long axis, and has a maximum SUV of 8.2, compatible with malignancy. There is associated hypermetabolic left level IIa adenopathy as shown on prior exams. 2. Symmetric accentuated activity in the lymphoid tissue at the tongue base, maximum SUV 5.3, quite likely physiologic given the symmetry.  3. No findings of metastatic disease Pred to the chest, abdomen/pelvis, or skeleton.   05/26/2018 Surgery   Radical tonsillectomy   05/26/2018 Pathology Results   PROCEDURE:  Radical tonsillectomy TUMOR SITE:  Glossotonsillar sulcus TUMOR LATERALITY:  LEFT TUMOR FOCALITY:  Unifocal TUMOR SIZE:  GREATEST DIMENSION:  1.6 cm HISTOLOGIC TYPE:  HPV-mediated squamous cell carcinoma MARGINS:  Involved by invasive tumor (focal) Specify location of closest margin:  Tongue base (soft tissue) Note:  Although an additional tongue base mucosal margin was submitted, location of tumor extension to margin appears 1 cm from the mucosal surface. LYMPHOVASCULAR INVASION:  Not identified PERINEURAL INVASION:  Present REGIONAL LYMPH NODES:   NUMBER OF LYMPH NODES INVOLVED:  1   NUMBER OF LYMPH NODES EXAMINED:  6   LATERALITY OF LYMPH NODES INVOLVED:  Ipsilateral   SIZE OF LARGEST METASTATIC DEPOSIT:  6.8 cm  EXTRANODAL EXTENSION:  Present     Distance from lymph node capsule:  6 millimeters PATHOLOGIC STAGE CLASSIFICATION (pTNM, AJCC 8TH Ed):  pT1 pN1 (HPV-mediated oropharynx tumor)   pT1:   Tumor 2 cm or smaller in greatest dimension   pN1:  Metastasis in 4 or fewer lymph nodes ANCILLARY STUDIES:   p16 expression by immunohistochemistry:  Positive   HPV by PCR: pending   05/26/2018 Cancer Staging   Staging form: Pharynx - HPV-Mediated Oropharynx, AJCC 8th Edition - Pathologic stage from 05/26/2018: Stage I (pT1, pN1, cM0, p16+) - Signed by Arthur Holms, MD on 06/18/2018   07/10/2018 - 07/10/2018 Chemotherapy   The patient had dexamethasone (DECADRON) 4 MG tablet, 1 of 1 cycle, Start date: 06/18/2018, End date: 06/23/2018 palonosetron (ALOXI) injection 0.25 mg, 0.25 mg, Intravenous,  Once, 0 of 3 cycles CISplatin (PLATINOL) 198 mg in sodium chloride 0.9 % 500 mL chemo infusion, 100 mg/m2 = 198 mg, Intravenous,  Once, 0 of 3 cycles fosaprepitant (EMEND) 150 mg, dexamethasone (DECADRON) 12 mg in sodium chloride 0.9 % 145 mL IVPB, , Intravenous,  Once, 0 of 3 cycles  for chemotherapy treatment.    07/10/2018 - 08/14/2018 Chemotherapy   The patient had dexamethasone (DECADRON) 4 MG tablet, 1 of 1 cycle, Start date: 06/23/2018, End date: 08/27/2018 palonosetron (ALOXI) injection 0.25 mg, 0.25 mg, Intravenous,  Once, 6 of 6 cycles Administration: 0.25 mg (07/10/2018), 0.25 mg (07/18/2018), 0.25 mg (07/24/2018), 0.25 mg (07/31/2018), 0.25 mg (08/07/2018), 0.25 mg (08/14/2018) CISplatin (PLATINOL) 79 mg in sodium chloride 0.9 % 250 mL chemo infusion, 40 mg/m2 = 79 mg, Intravenous,  Once, 6 of 6 cycles Administration: 79 mg (07/10/2018), 79 mg (07/18/2018), 79 mg (07/24/2018), 79 mg (07/31/2018), 79 mg (08/07/2018), 79 mg (08/14/2018) fosaprepitant (EMEND) 150 mg, dexamethasone (DECADRON) 12 mg in sodium chloride 0.9 % 145 mL IVPB, , Intravenous,  Once, 6 of 6 cycles Administration:  (07/10/2018),  (07/18/2018),  (07/24/2018),  (07/31/2018),  (08/07/2018),  (08/14/2018)  for chemotherapy treatment.    10/20/2018 Imaging   CT neck w/ contrast: IMPRESSION: Interval left tonsillectomy and neck dissection. No evidence  of residual mass in the left tonsillar region. Postsurgical distortion of the left neck, without evidence of residual or recurrent tumor.   I would categorize this as NI RADS 1 based on the CT, but this could be altered based on follow-up PET.   10/20/2018 Imaging   CT chest w/ contrast: IMPRESSION: No evidence of metastatic disease in the chest.   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HCC)  05/09/2018 Initial Diagnosis   Malignant neoplasm of base of tongue (HCC)   05/09/2018 Cancer Staging   Staging form: Pharynx - HPV-Mediated Oropharynx, AJCC 8th Edition - Clinical: Stage I (cT1, cN1, cM0, p16+) - Signed by Lonie Peak, MD on 05/09/2018   06/11/2018 Cancer Staging   Staging form: Pharynx - HPV-Mediated Oropharynx, AJCC 8th Edition - Pathologic: Stage I (pT1, pN1, cM0, p16+) - Signed by Lonie Peak, MD on 06/11/2018    This is a pleasant 62 year old male patient with a prior diagnosis of T1 N1 M0 p16 positive SCC left tonsil previously treated by Dr. Joanna Hews and Dr. Basilio Cairo who presents to the clinic for follow-up. Last visit with Dr Hezzie Bump in August 2024, no evidence of malignancy. He is now on 75 mcg of levothyroxine, following up with PCP for hypothyrodism Since his last visit here, he denies any other complaints today.  He has no difficulty swallowing.  He occasionally has some intermittently swollen lymph nodes in the  neck which tends to stress him out however he had a recent ultrasound as well at Dr. Raul Del office and according to the patient there were no major concerns.  No new cough, chest pain or shortness of breath.  MEDICAL HISTORY:  Past Medical History:  Diagnosis Date   Arthritis    Cancer (HCC)    OSA on CPAP    Restless leg syndrome     SURGICAL HISTORY: Past Surgical History:  Procedure Laterality Date   DIRECT LARYNGOSCOPY Left 04/22/2018   Dr. Jearld Fenton   FACIAL FRACTURE SURGERY Left 1986   IR ANGIOGRAM PULMONARY BILATERAL SELECTIVE  08/22/2018   IR ANGIOGRAM  SELECTIVE EACH ADDITIONAL VESSEL  08/22/2018   IR ANGIOGRAM SELECTIVE EACH ADDITIONAL VESSEL  08/22/2018   IR GASTROSTOMY TUBE MOD SED  06/30/2018   IR GASTROSTOMY TUBE REMOVAL  01/06/2019   IR IMAGING GUIDED PORT INSERTION  06/30/2018   IR INFUSION THROMBOL ARTERIAL INITIAL (MS)  08/22/2018   IR INFUSION THROMBOL ARTERIAL INITIAL (MS)  08/22/2018   IR REMOVAL TUN ACCESS W/ PORT W/O FL MOD SED  11/28/2018   IR THROMB F/U EVAL ART/VEN FINAL DAY (MS)  08/23/2018   IR US GUIDE VASC ACCESS RIGHT  08/22/2018   MODIFIED RADICAL NECK DISSECTION Left 05/26/2018   Dr. Hezzie Bump, [facility]   transoral robotic surgery  05/26/2018   [facility], Dr. Hezzie Bump [facility]    SOCIAL HISTORY: Social History   Socioeconomic History   Marital status: Married    Spouse name: Not on file   Number of children: 2   Years of education: Not on file   Highest education level: Not on file  Occupational History   Not on file  Tobacco Use   Smoking status: Never   Smokeless tobacco: Never  Vaping Use   Vaping status: Never Used  Substance and Sexual Activity   Alcohol use: Not Currently   Drug use: Never   Sexual activity: Not on file  Other Topics Concern   Not on file  Social History Narrative   Right handed   Lives in a two story home   Drinks coffee and tea    Social Determinants of Health   Financial Resource Strain: Not on file  Food Insecurity: Not on file  Transportation Needs: No Transportation Needs (05/05/2018)   PRAPARE - Administrator, Civil Service (Medical): No    Lack of Transportation (Non-Medical): No  Physical Activity: Not on file  Stress: Not on file  Social Connections: Not on file  Intimate Partner Violence: Not At Risk (06/10/2018)   Humiliation, Afraid, Rape, and Kick questionnaire    Fear of Current or Ex-Partner: No    Emotionally Abused: No    Physically Abused: No    Sexually Abused: No    FAMILY HISTORY: Family History  Problem Relation Age of Onset   Hypertension Mother     Prostate cancer Father     ALLERGIES:  has No Known Allergies.  MEDICATIONS:  Current Outpatient Medications  Medication Sig Dispense Refill   Acetaminophen (TYLENOL PO) Take by mouth.     CLINPRO 5000 1.1 % PSTE Place 1 application onto teeth as directed.     gabapentin (NEURONTIN) 600 MG tablet TAKE 1 TABLET BY MOUTH EVERYDAY AT BEDTIME 90 tablet 11   levothyroxine (SYNTHROID) 75 MCG tablet Take 75 mcg by mouth every morning.     omeprazole (PRILOSEC) 20 MG capsule Take 1 capsule by mouth daily.     tamsulosin (FLOMAX)  0.4 MG CAPS capsule Take 0.4 mg by mouth daily.     No current facility-administered medications for this visit.   Facility-Administered Medications Ordered in Other Visits  Medication Dose Route Frequency Provider Last Rate Last Admin   heparin lock flush 100 unit/mL  500 Units Intravenous Once Ennever, Rose Phi, MD       sodium chloride flush (NS) 0.9 % injection 10 mL  10 mL Intravenous PRN Glenn Macho, MD        PHYSICAL EXAMINATION: ECOG PERFORMANCE STATUS: 0 - Asymptomatic  Vitals:   01/28/23 0957  BP: 128/82  Pulse: 86  Resp: 18  Temp: 97.7 F (36.5 C)  SpO2: 95%    Filed Weights   01/28/23 0957  Weight: 177 lb 14.4 oz (80.7 kg)    Physical Exam Constitutional:      Appearance: Normal appearance.  Cardiovascular:     Rate and Rhythm: Normal rate and regular rhythm.     Pulses: Normal pulses.     Heart sounds: Normal heart sounds.  Pulmonary:     Effort: Pulmonary effort is normal.     Breath sounds: Normal breath sounds.  Abdominal:     General: Abdomen is flat.     Palpations: Abdomen is soft.  Musculoskeletal:        General: No swelling.     Cervical back: Normal range of motion and neck supple. No rigidity.  Lymphadenopathy:     Cervical: No cervical adenopathy.  Skin:    General: Skin is warm and dry.  Neurological:     Mental Status: He is alert.      LABORATORY DATA:  I have reviewed the data as listed Lab Results   Component Value Date   WBC 6.4 03/07/2020   HGB 16.1 03/07/2020   HCT 47.6 03/07/2020   MCV 92.8 03/07/2020   PLT 236 03/07/2020     Chemistry      Component Value Date/Time   NA 143 03/07/2020 1009   K 4.0 03/07/2020 1009   CL 106 03/07/2020 1009   CO2 27 03/07/2020 1009   BUN 17 03/07/2020 1009   CREATININE 1.07 03/07/2020 1009      Component Value Date/Time   CALCIUM 9.3 03/07/2020 1009   ALKPHOS 83 03/07/2020 1009   AST 15 03/07/2020 1009   ALT 21 03/07/2020 1009   BILITOT 0.3 03/07/2020 1009       RADIOGRAPHIC STUDIES: I have personally reviewed the radiological images as listed and agreed with the findings in the report. No results found.  All questions were answered. The patient knows to call the clinic with any problems, questions or concerns.       Rachel Moulds, MD 01/28/2023 10:00 AM

## 2023-05-03 DIAGNOSIS — R030 Elevated blood-pressure reading, without diagnosis of hypertension: Secondary | ICD-10-CM | POA: Diagnosis not present

## 2023-05-03 DIAGNOSIS — Z86718 Personal history of other venous thrombosis and embolism: Secondary | ICD-10-CM | POA: Diagnosis not present

## 2023-05-03 DIAGNOSIS — Z833 Family history of diabetes mellitus: Secondary | ICD-10-CM | POA: Diagnosis not present

## 2023-05-03 DIAGNOSIS — G4733 Obstructive sleep apnea (adult) (pediatric): Secondary | ICD-10-CM | POA: Diagnosis not present

## 2023-05-03 DIAGNOSIS — E1142 Type 2 diabetes mellitus with diabetic polyneuropathy: Secondary | ICD-10-CM | POA: Diagnosis not present

## 2023-05-03 DIAGNOSIS — Z8589 Personal history of malignant neoplasm of other organs and systems: Secondary | ICD-10-CM | POA: Diagnosis not present

## 2023-05-03 DIAGNOSIS — Z86711 Personal history of pulmonary embolism: Secondary | ICD-10-CM | POA: Diagnosis not present

## 2023-05-03 DIAGNOSIS — D6859 Other primary thrombophilia: Secondary | ICD-10-CM | POA: Diagnosis not present

## 2023-05-03 DIAGNOSIS — Z809 Family history of malignant neoplasm, unspecified: Secondary | ICD-10-CM | POA: Diagnosis not present

## 2023-05-03 DIAGNOSIS — E890 Postprocedural hypothyroidism: Secondary | ICD-10-CM | POA: Diagnosis not present

## 2023-05-03 DIAGNOSIS — Z008 Encounter for other general examination: Secondary | ICD-10-CM | POA: Diagnosis not present

## 2023-05-03 DIAGNOSIS — Z79890 Hormone replacement therapy (postmenopausal): Secondary | ICD-10-CM | POA: Diagnosis not present

## 2023-05-03 DIAGNOSIS — Z7982 Long term (current) use of aspirin: Secondary | ICD-10-CM | POA: Diagnosis not present

## 2023-12-09 NOTE — Progress Notes (Unsigned)
   NEUROLOGY FOLLOW UP OFFICE NOTE  Glenn Goodman [id number]  Assessment/Plan:   Restless leg syndrome  Gabapentin  600mg  at bedtime  Follow up one year.     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   UPDATE: Current medication:  gabapentin  600mg  QHS   Doing well.  Restless leg is controlled.   HISTORY: He has been dealing with RLS for 10 years.  It affects either on leg or the other but usually not affected at the same time.  Over the course of the past several years, he has increased pramipexole  from 0.25mg  to 1mg .  He has steadily had breakthrough symptoms at night and his PCP wouldn't increase the dose unless he was seen by a neurologist.  He takes the pramipexole  at 7:45 PM and gets into bed at around 10PM.  Normally he can sleep through the night, but previously was having some breakthrough attacks.  He is unable to nap during the day because the symptoms affect him.  Over the past year, he has had some symptoms in the arms as well.  He has OSA and uses a CPAP   He is not aware of family history of RLS.   Past medications:  Ropinirole 1mg  (ineffective), pramipexole  1mg  QHS  PAST MEDICAL HISTORY: Past Medical History:  Diagnosis Date   Arthritis    Cancer (HCC)    OSA on CPAP    Restless leg syndrome     MEDICATIONS: Current Outpatient Medications on File Prior to Visit  Medication Sig Dispense Refill   Acetaminophen  (TYLENOL  PO) Take by mouth.     CLINPRO 5000 1.1 % PSTE Place 1 application onto teeth as directed.     gabapentin  (NEURONTIN ) 600 MG tablet TAKE 1 TABLET BY MOUTH EVERYDAY AT BEDTIME 90 tablet 11   levothyroxine  (SYNTHROID ) 75 MCG tablet Take 75 mcg by mouth every morning.     omeprazole (PRILOSEC) 20 MG capsule Take 1 capsule by mouth daily.     tamsulosin (FLOMAX) 0.4 MG CAPS  capsule Take 0.4 mg by mouth daily.     Current Facility-Administered Medications on File Prior to Visit  Medication Dose Route Frequency Provider Last Rate Last Admin   heparin  lock flush 100 unit/mL  500 Units Intravenous Once Ennever, Glenn SAUNDERS, MD       sodium chloride  flush (NS) 0.9 % injection 10 mL  10 mL Intravenous PRN Ennever, Glenn SAUNDERS, MD        ALLERGIES: No Known Allergies  FAMILY HISTORY: Family History  Problem Relation Age of Onset   Hypertension Mother    Prostate cancer Father       Objective:  Blood pressure 106/71, pulse 80, height 5' 8 (1.727 m), weight 181 lb (82.1 kg), SpO2 95%. General: No acute distress.  Patient appears well-groomed.   Head:  Normocephalic/atraumatic Neck:  Supple.  No paraspinal tenderness.  Full range of motion. Heart:  Regular rate and rhythm. Neuro:  Alert and oriented.  Speech fluent and not dysarthric.  Language intact.  CN II-XII intact.  Bulk and tone normal.  Muscle strength 5/5 throughout.  Sensation to light touch intact.  Deep tendon reflexes 2+ throughout, toes downgoing.  Gait normal.  Romberg negative.   Glenn Dunnings, DO  CC: Glenn Bernhardt, MD

## 2023-12-10 ENCOUNTER — Encounter: Payer: Self-pay | Admitting: Neurology

## 2023-12-10 ENCOUNTER — Ambulatory Visit: Payer: 59 | Admitting: Neurology

## 2023-12-10 VITALS — BP 106/71 | HR 80 | Ht 68.0 in | Wt 181.0 lb

## 2023-12-10 DIAGNOSIS — G2581 Restless legs syndrome: Secondary | ICD-10-CM

## 2023-12-10 MED ORDER — GABAPENTIN 600 MG PO TABS: ORAL_TABLET | ORAL | 11 refills | Status: AC

## 2024-01-28 ENCOUNTER — Ambulatory Visit: Payer: 59 | Admitting: Hematology and Oncology

## 2024-02-03 ENCOUNTER — Encounter: Payer: Self-pay | Admitting: Hematology and Oncology

## 2024-02-03 ENCOUNTER — Inpatient Hospital Stay: Attending: Hematology and Oncology | Admitting: Hematology and Oncology

## 2024-02-03 VITALS — BP 127/86 | HR 71 | Temp 98.5°F | Resp 14 | Wt 179.1 lb

## 2024-02-03 DIAGNOSIS — Z08 Encounter for follow-up examination after completed treatment for malignant neoplasm: Secondary | ICD-10-CM | POA: Insufficient documentation

## 2024-02-03 DIAGNOSIS — E890 Postprocedural hypothyroidism: Secondary | ICD-10-CM | POA: Insufficient documentation

## 2024-02-03 DIAGNOSIS — Z8042 Family history of malignant neoplasm of prostate: Secondary | ICD-10-CM | POA: Insufficient documentation

## 2024-02-03 DIAGNOSIS — Z7982 Long term (current) use of aspirin: Secondary | ICD-10-CM | POA: Diagnosis not present

## 2024-02-03 DIAGNOSIS — Z79890 Hormone replacement therapy (postmenopausal): Secondary | ICD-10-CM | POA: Diagnosis not present

## 2024-02-03 DIAGNOSIS — Z85818 Personal history of malignant neoplasm of other sites of lip, oral cavity, and pharynx: Secondary | ICD-10-CM | POA: Diagnosis not present

## 2024-02-03 DIAGNOSIS — Z9221 Personal history of antineoplastic chemotherapy: Secondary | ICD-10-CM | POA: Diagnosis not present

## 2024-02-03 DIAGNOSIS — C4442 Squamous cell carcinoma of skin of scalp and neck: Secondary | ICD-10-CM

## 2024-02-03 DIAGNOSIS — Z86711 Personal history of pulmonary embolism: Secondary | ICD-10-CM | POA: Diagnosis not present

## 2024-02-03 NOTE — Progress Notes (Signed)
 [facility] CONSULT NOTE  Patient Care Team: Auston Opal, DO as PCP - General (Family Medicine) Roark Rush, MD as Consulting Physician (Otolaryngology) Izell Domino, MD as Attending Physician (Radiation Oncology) Jacelyn Lupita NOVAK, CCC-SLP as Speech Language Pathologist (Speech Pathology) Daryle Heron CROME, RD as Dietitian (Nutrition) Lanis Carbon, Florina CROME, PT as Physical Therapist (Physical Therapy) Donelda Tinnie HERO, LCSW as Social Worker Lauralee Chew, MD as Referring Physician (Otolaryngology) Skeet Juliene SAUNDERS, DO as Consulting Physician (Neurology) Loretha Ash, MD as Consulting Physician (Hematology and Oncology)  CHIEF COMPLAINTS/PURPOSE OF CONSULTATION:  SCC left tonsil, surveillance, following up with Oncology  ASSESSMENT & PLAN:   This is a very pleasant 63 year old male patient with 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 Zao who is here for a follow up.  Assessment and Plan Assessment & Plan Head and neck cancer in remission Currently in remission for over five years with no recurrence. Low likelihood of recurrence after five years - Discharge from regular follow-up with ENT. - Return for evaluation if new symptoms arise.  Chronic neck muscle soreness Chronic soreness likely muscular in origin, not consistent with lymphadenopathy.  Hypothyroidism, status post thyroidectomy Hypothyroidism managed with levothyroxine . Long-term medication requirement acknowledged. Primary care physician monitoring thyroid  function. - Continue levothyroxine  indefinitely. - Monitor thyroid  function with primary care physician.  Pulmonary embolism Saddle pulmonary embolism post-treatment, currently on low-dose aspirin for anticoagulation. - Continue low-dose aspirin indefinitely.    HISTORY OF PRESENTING  ILLNESS:  Glenn Goodman 63 y.o. male is here because of SCC left tonsil.  Oncology History  Squamous cell carcinoma of left tonsil (HCC)  03/20/2018 Procedure   FNA of the left cervical LN in office   03/20/2018 Pathology Results   Non-diagnostic   04/07/2018 Procedure   US -guided left cervical LN biopsy/FNA    04/07/2018 Pathology Results   (Accession: [id number]) Lymph node, needle/core biopsy, left cervical - METASTATIC SQUAMOUS CELL CARCINOMA INVOLVING A LYMPH NODE (1/1) - SEE COMMENT  p16 IHC positive. EBV ISH negative.    04/22/2018 Pathology Results   (Accession: [id number]) Tonsil, biopsy, left, base of tongue - INVASIVE KERATINIZING SQUAMOUS CELL CARCINOMA.   04/29/2018 Imaging   PET:  IMPRESSION: 1. Left palatine tonsil/adjacent pharyngeal mucosal space lesion is asymmetric, measures about 1.3 cm in long axis, and has a maximum SUV of 8.2, compatible with malignancy. There is associated hypermetabolic left level IIa adenopathy as shown on prior exams. 2. Symmetric accentuated activity in the lymphoid tissue at the tongue base, maximum SUV 5.3, quite likely physiologic given the symmetry.  3. No findings of metastatic disease Pred to the chest, abdomen/pelvis, or skeleton.   05/26/2018 Surgery   Radical tonsillectomy   05/26/2018 Pathology Results   PROCEDURE:  Radical tonsillectomy TUMOR SITE:  Glossotonsillar sulcus TUMOR LATERALITY:  LEFT TUMOR FOCALITY:  Unifocal TUMOR SIZE:  GREATEST DIMENSION:  1.6 cm HISTOLOGIC TYPE:  HPV-mediated squamous cell carcinoma MARGINS:  Involved by invasive tumor (focal) Specify location of closest margin:  Tongue base (soft tissue) Note:  Although an additional tongue base mucosal margin was submitted, location of tumor extension to margin appears 1 cm from the mucosal surface. LYMPHOVASCULAR INVASION:  Not identified PERINEURAL INVASION:  Present REGIONAL LYMPH NODES:   NUMBER OF LYMPH NODES INVOLVED:  1   NUMBER OF  LYMPH NODES EXAMINED:  6   LATERALITY OF LYMPH NODES INVOLVED:  Ipsilateral   SIZE OF LARGEST METASTATIC DEPOSIT:  6.8 cm   EXTRANODAL EXTENSION:  Present     Distance from lymph node capsule:  6 millimeters PATHOLOGIC STAGE CLASSIFICATION (pTNM, AJCC 8TH Ed):  pT1 pN1 (HPV-mediated oropharynx tumor)   pT1:  Tumor 2 cm or smaller in greatest dimension   pN1:  Metastasis in 4 or fewer lymph nodes ANCILLARY STUDIES:   p16 expression by immunohistochemistry:  Positive   HPV by PCR: pending   05/26/2018 Cancer Staging   Staging form: Pharynx - HPV-Mediated Oropharynx, AJCC 8th Edition - Pathologic stage from 05/26/2018: Stage I (pT1, pN1, cM0, p16+) - Signed by Maryelizabeth Callander, MD on 06/18/2018   07/10/2018 - 07/10/2018 Chemotherapy   The patient had dexamethasone  (DECADRON ) 4 MG tablet, 1 of 1 cycle, Start date: 06/18/2018, End date: 06/23/2018 palonosetron  (ALOXI ) injection 0.25 mg, 0.25 mg, Intravenous,  Once, 0 of 3 cycles CISplatin  (PLATINOL ) 198 mg in sodium chloride  0.9 % 500 mL chemo infusion, 100 mg/m2 = 198 mg, Intravenous,  Once, 0 of 3 cycles fosaprepitant  (EMEND) 150 mg, dexamethasone  (DECADRON ) 12 mg in sodium chloride  0.9 % 145 mL IVPB, , Intravenous,  Once, 0 of 3 cycles  for chemotherapy treatment.    07/10/2018 - 08/14/2018 Chemotherapy   The patient had dexamethasone  (DECADRON ) 4 MG tablet, 1 of 1 cycle, Start date: 06/23/2018, End date: 08/27/2018 palonosetron  (ALOXI ) injection 0.25 mg, 0.25 mg, Intravenous,  Once, 6 of 6 cycles Administration: 0.25 mg (07/10/2018), 0.25 mg (07/18/2018), 0.25 mg (07/24/2018), 0.25 mg (07/31/2018), 0.25 mg (08/07/2018), 0.25 mg (08/14/2018) CISplatin  (PLATINOL ) 79 mg in sodium chloride  0.9 % 250 mL chemo infusion, 40 mg/m2 = 79 mg, Intravenous,  Once, 6 of 6 cycles Administration: 79 mg (07/10/2018), 79 mg (07/18/2018), 79 mg (07/24/2018), 79 mg (07/31/2018), 79 mg (08/07/2018), 79 mg (08/14/2018) fosaprepitant  (EMEND) 150 mg, dexamethasone  (DECADRON ) 12 mg in sodium  chloride 0.9 % 145 mL IVPB, , Intravenous,  Once, 6 of 6 cycles Administration:  (07/10/2018),  (07/18/2018),  (07/24/2018),  (07/31/2018),  (08/07/2018),  (08/14/2018)  for chemotherapy treatment.    10/20/2018 Imaging   CT neck w/ contrast: IMPRESSION: Interval left tonsillectomy and neck dissection. No evidence of residual mass in the left tonsillar region. Postsurgical distortion of the left neck, without evidence of residual or recurrent tumor.   I would categorize this as NI RADS 1 based on the CT, but this could be altered based on follow-up PET.   10/20/2018 Imaging   CT chest w/ contrast: IMPRESSION: No evidence of metastatic disease in the chest.   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HCC)  05/09/2018 Initial Diagnosis   Malignant neoplasm of base of tongue (HCC)   05/09/2018 Cancer Staging   Staging form: Pharynx - HPV-Mediated Oropharynx, AJCC 8th Edition - Clinical: Stage I (cT1, cN1, cM0, p16+) - Signed by Izell Domino, MD on 05/09/2018   06/11/2018 Cancer Staging   Staging form: Pharynx - HPV-Mediated Oropharynx, AJCC 8th Edition - Pathologic: Stage I (pT1, pN1, cM0, p16+) - Signed by Izell Domino, MD on 06/11/2018    This is a pleasant 63 year old male patient with a prior diagnosis of T1 N1 M0 p16 positive SCC left tonsil previously treated by Dr. Zao and Dr. Izell who presents to the clinic for follow-up. Last visit with Dr Lauralee in August 2024, no evidence of malignancy. He is now on 75 mcg of levothyroxine , following up with PCP for hypothyrodism  Discussed the use of AI scribe software for clinical note transcription with the patient, who  gave verbal consent to proceed.  History of Present Illness Glenn Goodman is a 63 year old male who presents for follow-up.  He has a history of head and neck cancer and has been in remission for over five years. There have been no significant changes in his condition since his last visit, and he is able to swallow without  difficulty.  He is currently taking levothyroxine , which is being monitored by his primary care physician, and this will likely be a long-term medication. Additionally, he takes a low-dose aspirin daily for a previous saddle pulmonary embolism that occurred at the end of his cancer treatment. He does not take any other blood thinners.  He mentions a persistent sore 'knot' in his neck, which has been previously evaluated. It remains sore. He spends time sitting in front of a computer, which may contribute to muscular soreness.  In the review of symptoms, he reports stable weight, good swallowing, regular bowel movements, and no changes in breathing.    MEDICAL HISTORY:  Past Medical History:  Diagnosis Date   Arthritis    Cancer (HCC)    OSA on CPAP    Restless leg syndrome     SURGICAL HISTORY: Past Surgical History:  Procedure Laterality Date   DIRECT LARYNGOSCOPY Left 04/22/2018   Dr. Roark   FACIAL FRACTURE SURGERY Left 1986   IR ANGIOGRAM PULMONARY BILATERAL SELECTIVE  08/22/2018   IR ANGIOGRAM SELECTIVE EACH ADDITIONAL VESSEL  08/22/2018   IR ANGIOGRAM SELECTIVE EACH ADDITIONAL VESSEL  08/22/2018   IR GASTROSTOMY TUBE MOD SED  06/30/2018   IR GASTROSTOMY TUBE REMOVAL  01/06/2019   IR IMAGING GUIDED PORT INSERTION  06/30/2018   IR INFUSION THROMBOL ARTERIAL INITIAL (MS)  08/22/2018   IR INFUSION THROMBOL ARTERIAL INITIAL (MS)  08/22/2018   IR REMOVAL TUN ACCESS W/ PORT W/O FL MOD SED  11/28/2018   IR THROMB F/U EVAL ART/VEN FINAL DAY (MS)  08/23/2018   IR US  GUIDE VASC ACCESS RIGHT  08/22/2018   MODIFIED RADICAL NECK DISSECTION Left 05/26/2018   Dr. Lauralee, [facility]   transoral robotic surgery  05/26/2018   [facility], Dr. Lauralee [facility]    SOCIAL HISTORY: Social History   Socioeconomic History   Marital status: Married    Spouse name: Not on file   Number of children: 2   Years of education: Not on file   Highest education level: Not on file  Occupational History   Not on file  Tobacco  Use   Smoking status: Never   Smokeless tobacco: Never  Vaping Use   Vaping status: Never Used  Substance and Sexual Activity   Alcohol use: Not Currently   Drug use: Never   Sexual activity: Not on file  Other Topics Concern   Not on file  Social History Narrative   Right handed   Lives in a two story home   Drinks coffee and tea    Social Drivers of Corporate investment banker Strain: Not on file  Food Insecurity: Not on file  Transportation Needs: No Transportation Needs (05/05/2018)   PRAPARE - Transportation    Lack of Transportation (Medical): No    Lack of Transportation (Non-Medical): No  Physical Activity: Not on file  Stress: Not on file  Social Connections: Not on file  Intimate Partner Violence: Not At Risk (06/10/2018)   Humiliation, Afraid, Rape, and Kick questionnaire    Fear of Current or Ex-Partner: No    Emotionally Abused: No  Physically Abused: No    Sexually Abused: No    FAMILY HISTORY: Family History  Problem Relation Age of Onset   Hypertension Mother    Prostate cancer Father     ALLERGIES:  has no known allergies.  MEDICATIONS:  Current Outpatient Medications  Medication Sig Dispense Refill   Acetaminophen  (TYLENOL  PO) Take by mouth.     CLINPRO 5000 1.1 % PSTE Place 1 application onto teeth as directed.     gabapentin  (NEURONTIN ) 600 MG tablet TAKE 1 TABLET BY MOUTH EVERYDAY AT BEDTIME 30 tablet 11   levothyroxine  (SYNTHROID ) 75 MCG tablet Take 75 mcg by mouth every morning.     omeprazole (PRILOSEC) 20 MG capsule Take 1 capsule by mouth daily. (Patient taking differently: Take 1 capsule by mouth as needed.)     tamsulosin (FLOMAX) 0.4 MG CAPS capsule Take 0.4 mg by mouth daily.     No current facility-administered medications for this visit.   Facility-Administered Medications Ordered in Other Visits  Medication Dose Route Frequency Provider Last Rate Last Admin   heparin  lock flush 100 unit/mL  500 Units Intravenous Once Ennever,  Peter R, MD       sodium chloride  flush (NS) 0.9 % injection 10 mL  10 mL Intravenous PRN Timmy Maude SAUNDERS, MD        PHYSICAL EXAMINATION: ECOG PERFORMANCE STATUS: 0 - Asymptomatic  Vitals:   02/03/24 0856  BP: 127/86  Pulse: 71  Resp: 14  Temp: 98.5 F (36.9 C)  SpO2: 96%    Filed Weights   02/03/24 0856  Weight: 179 lb 1.6 oz (81.2 kg)    Physical Exam Constitutional:      Appearance: Normal appearance.  HENT:     Mouth/Throat:     Comments: Normal Cardiovascular:     Rate and Rhythm: Normal rate and regular rhythm.     Pulses: Normal pulses.     Heart sounds: Normal heart sounds.  Pulmonary:     Effort: Pulmonary effort is normal.     Breath sounds: Normal breath sounds.  Abdominal:     General: Abdomen is flat.     Palpations: Abdomen is soft.  Musculoskeletal:        General: No swelling.     Cervical back: Normal range of motion and neck supple. No rigidity.  Lymphadenopathy:     Cervical: No cervical adenopathy.  Skin:    General: Skin is warm and dry.  Neurological:     Mental Status: He is alert.      LABORATORY DATA:  I have reviewed the data as listed Lab Results  Component Value Date   WBC 6.4 03/07/2020   HGB 16.1 03/07/2020   HCT 47.6 03/07/2020   MCV 92.8 03/07/2020   PLT 236 03/07/2020     Chemistry      Component Value Date/Time   NA 143 03/07/2020 1009   K 4.0 03/07/2020 1009   CL 106 03/07/2020 1009   CO2 27 03/07/2020 1009   BUN 17 03/07/2020 1009   CREATININE 1.07 03/07/2020 1009      Component Value Date/Time   CALCIUM 9.3 03/07/2020 1009   ALKPHOS 83 03/07/2020 1009   AST 15 03/07/2020 1009   ALT 21 03/07/2020 1009   BILITOT 0.3 03/07/2020 1009       RADIOGRAPHIC STUDIES: I have personally reviewed the radiological images as listed and agreed with the findings in the report. No results found.  All questions were answered. The patient knows  to call the clinic with any problems, questions or concerns.        Amber Stalls, MD 02/03/2024 9:08 AM

## 2024-03-18 DIAGNOSIS — E119 Type 2 diabetes mellitus without complications: Secondary | ICD-10-CM | POA: Diagnosis not present

## 2024-03-18 DIAGNOSIS — H02834 Dermatochalasis of left upper eyelid: Secondary | ICD-10-CM | POA: Diagnosis not present

## 2024-03-18 DIAGNOSIS — H02831 Dermatochalasis of right upper eyelid: Secondary | ICD-10-CM | POA: Diagnosis not present

## 2024-03-18 DIAGNOSIS — H04123 Dry eye syndrome of bilateral lacrimal glands: Secondary | ICD-10-CM | POA: Diagnosis not present

## 2024-04-07 DIAGNOSIS — N401 Enlarged prostate with lower urinary tract symptoms: Secondary | ICD-10-CM | POA: Diagnosis not present

## 2024-04-07 DIAGNOSIS — K219 Gastro-esophageal reflux disease without esophagitis: Secondary | ICD-10-CM | POA: Diagnosis not present

## 2024-04-07 DIAGNOSIS — E782 Mixed hyperlipidemia: Secondary | ICD-10-CM | POA: Diagnosis not present

## 2024-04-07 DIAGNOSIS — Z1331 Encounter for screening for depression: Secondary | ICD-10-CM | POA: Diagnosis not present

## 2024-04-07 DIAGNOSIS — Z0000 Encounter for general adult medical examination without abnormal findings: Secondary | ICD-10-CM | POA: Diagnosis not present

## 2024-04-07 DIAGNOSIS — I2782 Chronic pulmonary embolism: Secondary | ICD-10-CM | POA: Diagnosis not present

## 2024-04-07 DIAGNOSIS — E039 Hypothyroidism, unspecified: Secondary | ICD-10-CM | POA: Diagnosis not present

## 2024-04-07 DIAGNOSIS — Z125 Encounter for screening for malignant neoplasm of prostate: Secondary | ICD-10-CM | POA: Diagnosis not present

## 2024-04-07 DIAGNOSIS — Z23 Encounter for immunization: Secondary | ICD-10-CM | POA: Diagnosis not present

## 2024-04-07 DIAGNOSIS — E118 Type 2 diabetes mellitus with unspecified complications: Secondary | ICD-10-CM | POA: Diagnosis not present

## 2024-12-08 ENCOUNTER — Ambulatory Visit: Admitting: Neurology
